# Patient Record
Sex: Female | Born: 1945 | Race: White | Hispanic: No | Marital: Married | Smoking: Current some day smoker
Health system: Southern US, Community
[De-identification: ages and names within clinical notes are randomized; demographics above are authoritative.]

## PROBLEM LIST (undated history)

## (undated) DIAGNOSIS — Z9221 Personal history of antineoplastic chemotherapy: Secondary | ICD-10-CM

## (undated) DIAGNOSIS — F32A Depression, unspecified: Secondary | ICD-10-CM

## (undated) DIAGNOSIS — R000 Tachycardia, unspecified: Secondary | ICD-10-CM

## (undated) DIAGNOSIS — J42 Unspecified chronic bronchitis: Secondary | ICD-10-CM

## (undated) DIAGNOSIS — E039 Hypothyroidism, unspecified: Secondary | ICD-10-CM

## (undated) DIAGNOSIS — K219 Gastro-esophageal reflux disease without esophagitis: Secondary | ICD-10-CM

## (undated) DIAGNOSIS — I5022 Chronic systolic (congestive) heart failure: Secondary | ICD-10-CM

## (undated) DIAGNOSIS — C3410 Malignant neoplasm of upper lobe, unspecified bronchus or lung: Secondary | ICD-10-CM

## (undated) DIAGNOSIS — J449 Chronic obstructive pulmonary disease, unspecified: Secondary | ICD-10-CM

## (undated) DIAGNOSIS — Z923 Personal history of irradiation: Secondary | ICD-10-CM

## (undated) DIAGNOSIS — I10 Essential (primary) hypertension: Secondary | ICD-10-CM

## (undated) DIAGNOSIS — B029 Zoster without complications: Secondary | ICD-10-CM

## (undated) DIAGNOSIS — W06XXXA Fall from bed, initial encounter: Secondary | ICD-10-CM

## (undated) DIAGNOSIS — I639 Cerebral infarction, unspecified: Secondary | ICD-10-CM

## (undated) DIAGNOSIS — C3490 Malignant neoplasm of unspecified part of unspecified bronchus or lung: Principal | ICD-10-CM

## (undated) DIAGNOSIS — G40909 Epilepsy, unspecified, not intractable, without status epilepticus: Secondary | ICD-10-CM

## (undated) DIAGNOSIS — M1990 Unspecified osteoarthritis, unspecified site: Secondary | ICD-10-CM

## (undated) DIAGNOSIS — M545 Low back pain, unspecified: Secondary | ICD-10-CM

## (undated) DIAGNOSIS — R011 Cardiac murmur, unspecified: Secondary | ICD-10-CM

## (undated) DIAGNOSIS — F329 Major depressive disorder, single episode, unspecified: Secondary | ICD-10-CM

## (undated) HISTORY — DX: Malignant neoplasm of unspecified part of unspecified bronchus or lung: C34.90

## (undated) HISTORY — DX: Chronic obstructive pulmonary disease, unspecified: J44.9

## (undated) HISTORY — DX: Low back pain: M54.5

## (undated) HISTORY — PX: TONSILLECTOMY: SUR1361

## (undated) HISTORY — DX: Zoster without complications: B02.9

## (undated) HISTORY — DX: Hypothyroidism, unspecified: E03.9

## (undated) HISTORY — DX: Low back pain, unspecified: M54.50

## (undated) HISTORY — DX: Personal history of antineoplastic chemotherapy: Z92.21

## (undated) HISTORY — DX: Malignant neoplasm of upper lobe, unspecified bronchus or lung: C34.10

## (undated) HISTORY — DX: Gastro-esophageal reflux disease without esophagitis: K21.9

---

## 1982-06-26 HISTORY — PX: TUBAL LIGATION: SHX77

## 1984-06-26 HISTORY — PX: VAGINAL HYSTERECTOMY: SUR661

## 1998-11-02 ENCOUNTER — Other Ambulatory Visit: Admission: RE | Admit: 1998-11-02 | Discharge: 1998-11-02 | Payer: Self-pay | Admitting: Endocrinology

## 1999-12-01 ENCOUNTER — Other Ambulatory Visit: Admission: RE | Admit: 1999-12-01 | Discharge: 1999-12-01 | Payer: Self-pay | Admitting: Endocrinology

## 2000-01-23 ENCOUNTER — Encounter: Admission: RE | Admit: 2000-01-23 | Discharge: 2000-01-23 | Payer: Self-pay | Admitting: Endocrinology

## 2000-01-23 ENCOUNTER — Encounter: Payer: Self-pay | Admitting: Endocrinology

## 2000-12-07 ENCOUNTER — Encounter: Payer: Self-pay | Admitting: Endocrinology

## 2000-12-07 ENCOUNTER — Encounter: Admission: RE | Admit: 2000-12-07 | Discharge: 2000-12-07 | Payer: Self-pay | Admitting: Endocrinology

## 2000-12-24 ENCOUNTER — Other Ambulatory Visit: Admission: RE | Admit: 2000-12-24 | Discharge: 2000-12-24 | Payer: Self-pay | Admitting: Endocrinology

## 2001-12-09 ENCOUNTER — Encounter: Admission: RE | Admit: 2001-12-09 | Discharge: 2001-12-09 | Payer: Self-pay | Admitting: Endocrinology

## 2001-12-09 ENCOUNTER — Encounter: Payer: Self-pay | Admitting: Endocrinology

## 2003-01-16 ENCOUNTER — Other Ambulatory Visit: Admission: RE | Admit: 2003-01-16 | Discharge: 2003-01-16 | Payer: Self-pay | Admitting: Endocrinology

## 2004-04-15 ENCOUNTER — Ambulatory Visit: Payer: Self-pay | Admitting: Internal Medicine

## 2004-04-27 ENCOUNTER — Ambulatory Visit (HOSPITAL_COMMUNITY): Admission: RE | Admit: 2004-04-27 | Discharge: 2004-04-27 | Payer: Self-pay | Admitting: Nurse Practitioner

## 2004-04-27 ENCOUNTER — Ambulatory Visit: Payer: Self-pay | Admitting: Internal Medicine

## 2004-05-05 ENCOUNTER — Ambulatory Visit: Payer: Self-pay | Admitting: *Deleted

## 2004-05-09 ENCOUNTER — Ambulatory Visit: Payer: Self-pay | Admitting: Internal Medicine

## 2004-05-18 ENCOUNTER — Ambulatory Visit: Payer: Self-pay | Admitting: Internal Medicine

## 2004-08-16 ENCOUNTER — Ambulatory Visit: Payer: Self-pay | Admitting: Internal Medicine

## 2004-08-18 ENCOUNTER — Ambulatory Visit: Payer: Self-pay | Admitting: Internal Medicine

## 2004-08-31 ENCOUNTER — Ambulatory Visit: Payer: Self-pay | Admitting: Internal Medicine

## 2004-09-07 ENCOUNTER — Ambulatory Visit: Payer: Self-pay | Admitting: Internal Medicine

## 2004-09-14 ENCOUNTER — Ambulatory Visit: Payer: Self-pay | Admitting: Internal Medicine

## 2004-09-19 ENCOUNTER — Ambulatory Visit: Payer: Self-pay | Admitting: Internal Medicine

## 2004-09-21 ENCOUNTER — Ambulatory Visit: Payer: Self-pay | Admitting: Internal Medicine

## 2004-10-05 ENCOUNTER — Ambulatory Visit: Payer: Self-pay | Admitting: Internal Medicine

## 2004-10-10 ENCOUNTER — Ambulatory Visit: Payer: Self-pay | Admitting: Internal Medicine

## 2004-10-17 ENCOUNTER — Ambulatory Visit: Payer: Self-pay | Admitting: Internal Medicine

## 2004-10-19 ENCOUNTER — Ambulatory Visit: Payer: Self-pay | Admitting: Internal Medicine

## 2004-11-02 ENCOUNTER — Ambulatory Visit: Payer: Self-pay | Admitting: Internal Medicine

## 2004-11-29 ENCOUNTER — Ambulatory Visit: Payer: Self-pay | Admitting: Internal Medicine

## 2004-12-07 ENCOUNTER — Ambulatory Visit (HOSPITAL_COMMUNITY): Admission: RE | Admit: 2004-12-07 | Discharge: 2004-12-07 | Payer: Self-pay | Admitting: Family Medicine

## 2004-12-23 ENCOUNTER — Ambulatory Visit: Payer: Self-pay | Admitting: Internal Medicine

## 2005-01-06 ENCOUNTER — Ambulatory Visit: Payer: Self-pay | Admitting: Internal Medicine

## 2005-01-10 ENCOUNTER — Ambulatory Visit: Payer: Self-pay | Admitting: Internal Medicine

## 2005-01-19 ENCOUNTER — Ambulatory Visit: Payer: Self-pay | Admitting: Internal Medicine

## 2005-02-03 ENCOUNTER — Ambulatory Visit: Payer: Self-pay | Admitting: Internal Medicine

## 2005-03-15 ENCOUNTER — Ambulatory Visit: Payer: Self-pay | Admitting: Internal Medicine

## 2005-05-11 ENCOUNTER — Ambulatory Visit: Payer: Self-pay | Admitting: Family Medicine

## 2005-05-15 ENCOUNTER — Ambulatory Visit: Payer: Self-pay | Admitting: Internal Medicine

## 2005-06-27 ENCOUNTER — Ambulatory Visit: Payer: Self-pay | Admitting: Internal Medicine

## 2005-06-30 ENCOUNTER — Ambulatory Visit: Payer: Self-pay | Admitting: Internal Medicine

## 2005-07-06 ENCOUNTER — Ambulatory Visit: Payer: Self-pay | Admitting: Internal Medicine

## 2005-08-16 ENCOUNTER — Encounter: Admission: RE | Admit: 2005-08-16 | Discharge: 2005-08-16 | Payer: Self-pay | Admitting: General Surgery

## 2005-09-18 ENCOUNTER — Ambulatory Visit: Payer: Self-pay | Admitting: Internal Medicine

## 2005-10-27 ENCOUNTER — Ambulatory Visit: Payer: Self-pay | Admitting: Internal Medicine

## 2006-02-27 ENCOUNTER — Ambulatory Visit: Payer: Self-pay | Admitting: Family Medicine

## 2006-03-20 ENCOUNTER — Ambulatory Visit (HOSPITAL_COMMUNITY): Admission: RE | Admit: 2006-03-20 | Discharge: 2006-03-20 | Payer: Self-pay | Admitting: Family Medicine

## 2006-03-27 ENCOUNTER — Ambulatory Visit: Payer: Self-pay | Admitting: Internal Medicine

## 2006-04-20 ENCOUNTER — Ambulatory Visit: Payer: Self-pay | Admitting: Family Medicine

## 2006-05-11 ENCOUNTER — Ambulatory Visit: Payer: Self-pay | Admitting: Internal Medicine

## 2006-07-09 ENCOUNTER — Ambulatory Visit: Payer: Self-pay | Admitting: Family Medicine

## 2006-07-11 ENCOUNTER — Ambulatory Visit: Payer: Self-pay | Admitting: Family Medicine

## 2006-08-06 ENCOUNTER — Ambulatory Visit: Payer: Self-pay | Admitting: Internal Medicine

## 2006-09-19 ENCOUNTER — Ambulatory Visit (HOSPITAL_COMMUNITY): Admission: RE | Admit: 2006-09-19 | Discharge: 2006-09-19 | Payer: Self-pay | Admitting: Family Medicine

## 2006-11-02 ENCOUNTER — Ambulatory Visit: Payer: Self-pay | Admitting: Internal Medicine

## 2007-03-13 ENCOUNTER — Encounter (INDEPENDENT_AMBULATORY_CARE_PROVIDER_SITE_OTHER): Payer: Self-pay | Admitting: *Deleted

## 2007-03-15 DIAGNOSIS — M19049 Primary osteoarthritis, unspecified hand: Secondary | ICD-10-CM | POA: Insufficient documentation

## 2007-03-15 DIAGNOSIS — F329 Major depressive disorder, single episode, unspecified: Secondary | ICD-10-CM | POA: Insufficient documentation

## 2007-03-15 DIAGNOSIS — J439 Emphysema, unspecified: Secondary | ICD-10-CM

## 2007-03-15 DIAGNOSIS — J209 Acute bronchitis, unspecified: Secondary | ICD-10-CM | POA: Insufficient documentation

## 2007-03-15 DIAGNOSIS — I10 Essential (primary) hypertension: Secondary | ICD-10-CM | POA: Insufficient documentation

## 2007-03-15 DIAGNOSIS — E039 Hypothyroidism, unspecified: Secondary | ICD-10-CM | POA: Insufficient documentation

## 2007-03-15 DIAGNOSIS — F3289 Other specified depressive episodes: Secondary | ICD-10-CM | POA: Insufficient documentation

## 2007-05-01 ENCOUNTER — Ambulatory Visit: Payer: Self-pay | Admitting: Internal Medicine

## 2007-05-02 ENCOUNTER — Encounter (INDEPENDENT_AMBULATORY_CARE_PROVIDER_SITE_OTHER): Payer: Self-pay | Admitting: Internal Medicine

## 2007-05-09 ENCOUNTER — Ambulatory Visit: Payer: Self-pay | Admitting: Internal Medicine

## 2007-05-15 ENCOUNTER — Encounter (INDEPENDENT_AMBULATORY_CARE_PROVIDER_SITE_OTHER): Payer: Self-pay | Admitting: Internal Medicine

## 2007-05-15 LAB — CONVERTED CEMR LAB
HDL: 43 mg/dL (ref >39)
Triglycerides: 184 mg/dL — ABNORMAL HIGH (ref <150)

## 2007-07-01 ENCOUNTER — Telehealth (INDEPENDENT_AMBULATORY_CARE_PROVIDER_SITE_OTHER): Payer: Self-pay | Admitting: Nurse Practitioner

## 2007-07-11 ENCOUNTER — Ambulatory Visit: Payer: Self-pay | Admitting: Internal Medicine

## 2007-07-11 DIAGNOSIS — F17200 Nicotine dependence, unspecified, uncomplicated: Secondary | ICD-10-CM

## 2007-07-18 ENCOUNTER — Ambulatory Visit: Payer: Self-pay | Admitting: Internal Medicine

## 2007-08-05 ENCOUNTER — Ambulatory Visit: Payer: Self-pay | Admitting: Internal Medicine

## 2007-12-13 ENCOUNTER — Ambulatory Visit: Payer: Self-pay | Admitting: Internal Medicine

## 2007-12-17 ENCOUNTER — Ambulatory Visit (HOSPITAL_COMMUNITY): Admission: RE | Admit: 2007-12-17 | Discharge: 2007-12-17 | Payer: Self-pay | Admitting: Internal Medicine

## 2007-12-17 ENCOUNTER — Telehealth (INDEPENDENT_AMBULATORY_CARE_PROVIDER_SITE_OTHER): Payer: Self-pay | Admitting: Internal Medicine

## 2007-12-17 ENCOUNTER — Encounter (INDEPENDENT_AMBULATORY_CARE_PROVIDER_SITE_OTHER): Payer: Self-pay | Admitting: Internal Medicine

## 2007-12-19 ENCOUNTER — Telehealth (INDEPENDENT_AMBULATORY_CARE_PROVIDER_SITE_OTHER): Payer: Self-pay | Admitting: Internal Medicine

## 2008-03-25 ENCOUNTER — Ambulatory Visit: Payer: Self-pay | Admitting: Internal Medicine

## 2008-03-25 LAB — CONVERTED CEMR LAB
ALT: 14 units/L (ref 0–35)
HDL: 47 mg/dL (ref >39)
LDL Cholesterol: 131 mg/dL — ABNORMAL HIGH (ref 0–99)
Total Bilirubin: 0.5 mg/dL (ref 0.3–1.2)
Total CHOL/HDL Ratio: 4.4
Triglycerides: 139 mg/dL (ref <150)
VLDL: 28 mg/dL (ref 0–40)

## 2008-03-27 ENCOUNTER — Telehealth (INDEPENDENT_AMBULATORY_CARE_PROVIDER_SITE_OTHER): Payer: Self-pay | Admitting: Internal Medicine

## 2008-03-27 ENCOUNTER — Ambulatory Visit: Payer: Self-pay | Admitting: Internal Medicine

## 2008-03-31 ENCOUNTER — Ambulatory Visit: Payer: Self-pay | Admitting: Internal Medicine

## 2008-04-13 ENCOUNTER — Telehealth (INDEPENDENT_AMBULATORY_CARE_PROVIDER_SITE_OTHER): Payer: Self-pay | Admitting: Nurse Practitioner

## 2008-05-09 ENCOUNTER — Telehealth (INDEPENDENT_AMBULATORY_CARE_PROVIDER_SITE_OTHER): Payer: Self-pay | Admitting: Internal Medicine

## 2008-05-31 ENCOUNTER — Inpatient Hospital Stay (HOSPITAL_COMMUNITY): Admission: EM | Admit: 2008-05-31 | Discharge: 2008-06-01 | Payer: Self-pay | Admitting: Emergency Medicine

## 2008-05-31 ENCOUNTER — Ambulatory Visit: Payer: Self-pay | Admitting: Family Medicine

## 2008-07-07 ENCOUNTER — Ambulatory Visit: Payer: Self-pay | Admitting: Internal Medicine

## 2008-07-07 DIAGNOSIS — E876 Hypokalemia: Secondary | ICD-10-CM | POA: Insufficient documentation

## 2008-07-07 DIAGNOSIS — J189 Pneumonia, unspecified organism: Secondary | ICD-10-CM

## 2008-07-07 LAB — CONVERTED CEMR LAB
BUN: 12 mg/dL (ref 6–23)
CO2: 24 meq/L (ref 19–32)
Calcium: 9 mg/dL (ref 8.4–10.5)
Creatinine, Ser: 0.83 mg/dL (ref 0.40–1.20)
Glucose, Bld: 85 mg/dL (ref 70–99)
Potassium: 4.3 meq/L (ref 3.5–5.3)

## 2008-08-11 ENCOUNTER — Telehealth (INDEPENDENT_AMBULATORY_CARE_PROVIDER_SITE_OTHER): Payer: Self-pay | Admitting: Internal Medicine

## 2008-09-04 ENCOUNTER — Ambulatory Visit: Payer: Self-pay | Admitting: Internal Medicine

## 2008-09-04 LAB — CONVERTED CEMR LAB
ALT: 9 units/L (ref 0–35)
AST: 15 units/L (ref 0–37)
Albumin: 4.4 g/dL (ref 3.5–5.2)
Alkaline Phosphatase: 66 units/L (ref 39–117)
CO2: 28 meq/L (ref 19–32)
Calcium: 9.7 mg/dL (ref 8.4–10.5)
Cholesterol: 224 mg/dL — ABNORMAL HIGH (ref 0–200)
Creatinine, Ser: 0.88 mg/dL (ref 0.40–1.20)
Glucose, Bld: 100 mg/dL — ABNORMAL HIGH (ref 70–99)
HDL: 54 mg/dL (ref >39)
LDL Cholesterol: 153 mg/dL — ABNORMAL HIGH (ref 0–99)
Total Protein: 7.2 g/dL (ref 6.0–8.3)
Triglycerides: 84 mg/dL (ref <150)

## 2008-09-13 ENCOUNTER — Encounter (INDEPENDENT_AMBULATORY_CARE_PROVIDER_SITE_OTHER): Payer: Self-pay | Admitting: Internal Medicine

## 2008-10-06 ENCOUNTER — Ambulatory Visit: Payer: Self-pay | Admitting: Internal Medicine

## 2008-10-18 ENCOUNTER — Encounter (INDEPENDENT_AMBULATORY_CARE_PROVIDER_SITE_OTHER): Payer: Self-pay | Admitting: Internal Medicine

## 2008-11-17 ENCOUNTER — Telehealth (INDEPENDENT_AMBULATORY_CARE_PROVIDER_SITE_OTHER): Payer: Self-pay | Admitting: Internal Medicine

## 2008-12-10 ENCOUNTER — Ambulatory Visit: Payer: Self-pay | Admitting: Internal Medicine

## 2008-12-10 DIAGNOSIS — J309 Allergic rhinitis, unspecified: Secondary | ICD-10-CM | POA: Insufficient documentation

## 2008-12-10 DIAGNOSIS — E785 Hyperlipidemia, unspecified: Secondary | ICD-10-CM | POA: Insufficient documentation

## 2009-03-04 ENCOUNTER — Telehealth (INDEPENDENT_AMBULATORY_CARE_PROVIDER_SITE_OTHER): Payer: Self-pay | Admitting: Internal Medicine

## 2009-03-11 ENCOUNTER — Ambulatory Visit: Payer: Self-pay | Admitting: Internal Medicine

## 2009-03-11 DIAGNOSIS — D126 Benign neoplasm of colon, unspecified: Secondary | ICD-10-CM | POA: Insufficient documentation

## 2009-03-11 LAB — CONVERTED CEMR LAB
Cholesterol: 225 mg/dL — ABNORMAL HIGH (ref 0–200)
HDL: 51 mg/dL (ref >39)
LDL Cholesterol: 150 mg/dL — ABNORMAL HIGH (ref 0–99)

## 2009-03-12 ENCOUNTER — Encounter: Admission: RE | Admit: 2009-03-12 | Discharge: 2009-03-12 | Payer: Self-pay | Admitting: Internal Medicine

## 2009-03-16 ENCOUNTER — Ambulatory Visit (HOSPITAL_COMMUNITY): Admission: RE | Admit: 2009-03-16 | Discharge: 2009-03-16 | Payer: Self-pay | Admitting: Internal Medicine

## 2009-03-16 ENCOUNTER — Encounter (INDEPENDENT_AMBULATORY_CARE_PROVIDER_SITE_OTHER): Payer: Self-pay | Admitting: Internal Medicine

## 2009-03-16 DIAGNOSIS — M899 Disorder of bone, unspecified: Secondary | ICD-10-CM | POA: Insufficient documentation

## 2009-03-16 DIAGNOSIS — M949 Disorder of cartilage, unspecified: Secondary | ICD-10-CM

## 2009-03-22 ENCOUNTER — Encounter (INDEPENDENT_AMBULATORY_CARE_PROVIDER_SITE_OTHER): Payer: Self-pay | Admitting: Internal Medicine

## 2009-03-31 ENCOUNTER — Ambulatory Visit: Payer: Self-pay | Admitting: Internal Medicine

## 2009-04-07 ENCOUNTER — Ambulatory Visit: Payer: Self-pay | Admitting: Gastroenterology

## 2009-04-07 ENCOUNTER — Telehealth: Payer: Self-pay | Admitting: Physician Assistant

## 2009-04-09 ENCOUNTER — Ambulatory Visit: Payer: Self-pay | Admitting: Internal Medicine

## 2009-04-15 ENCOUNTER — Encounter: Payer: Self-pay | Admitting: Gastroenterology

## 2009-04-15 ENCOUNTER — Ambulatory Visit: Payer: Self-pay | Admitting: Gastroenterology

## 2009-04-19 ENCOUNTER — Encounter: Payer: Self-pay | Admitting: Gastroenterology

## 2009-04-28 ENCOUNTER — Encounter (INDEPENDENT_AMBULATORY_CARE_PROVIDER_SITE_OTHER): Payer: Self-pay | Admitting: Internal Medicine

## 2009-06-22 ENCOUNTER — Telehealth (INDEPENDENT_AMBULATORY_CARE_PROVIDER_SITE_OTHER): Payer: Self-pay | Admitting: Internal Medicine

## 2009-06-23 ENCOUNTER — Ambulatory Visit: Payer: Self-pay | Admitting: Internal Medicine

## 2009-06-23 DIAGNOSIS — J019 Acute sinusitis, unspecified: Secondary | ICD-10-CM

## 2009-06-23 DIAGNOSIS — J441 Chronic obstructive pulmonary disease with (acute) exacerbation: Secondary | ICD-10-CM

## 2009-06-23 LAB — CONVERTED CEMR LAB: Rapid Strep: NEGATIVE

## 2009-09-07 ENCOUNTER — Ambulatory Visit: Payer: Self-pay | Admitting: Internal Medicine

## 2009-09-07 ENCOUNTER — Ambulatory Visit (HOSPITAL_COMMUNITY): Admission: RE | Admit: 2009-09-07 | Discharge: 2009-09-07 | Payer: Self-pay | Admitting: Internal Medicine

## 2009-09-07 LAB — CONVERTED CEMR LAB
Alkaline Phosphatase: 67 units/L (ref 39–117)
Basophils Absolute: 0 10*3/uL (ref 0.0–0.1)
Basophils Relative: 0 % (ref 0–1)
Calcium: 9.6 mg/dL (ref 8.4–10.5)
Chlamydia, Swab/Urine, PCR: NEGATIVE
Cholesterol: 232 mg/dL — ABNORMAL HIGH (ref 0–200)
Creatinine, Ser: 0.8 mg/dL (ref 0.40–1.20)
Eosinophils Absolute: 0.1 10*3/uL (ref 0.0–0.7)
GC Probe Amp, Urine: NEGATIVE
Glucose, Bld: 95 mg/dL (ref 70–99)
HCT: 45.3 % (ref 36.0–46.0)
HDL: 54 mg/dL (ref >39)
Hemoglobin: 14.8 g/dL (ref 12.0–15.0)
LDL Cholesterol: 155 mg/dL — ABNORMAL HIGH (ref 0–99)
Lymphocytes Relative: 17 % (ref 12–46)
MCV: 94.6 fL (ref 78.0–100.0)
Monocytes Absolute: 0.7 10*3/uL (ref 0.1–1.0)
Monocytes Relative: 9 % (ref 3–12)
Neutro Abs: 5.8 10*3/uL (ref 1.7–7.7)
Platelets: 375 10*3/uL (ref 150–400)
Potassium: 4.1 meq/L (ref 3.5–5.3)
RDW: 13.3 % (ref 11.5–15.5)
TSH: 0.244 microintl units/mL — ABNORMAL LOW (ref 0.350–4.500)
Urobilinogen, UA: NEGATIVE
VLDL: 23 mg/dL (ref 0–40)

## 2009-10-04 ENCOUNTER — Encounter (INDEPENDENT_AMBULATORY_CARE_PROVIDER_SITE_OTHER): Payer: Self-pay | Admitting: Internal Medicine

## 2009-12-22 ENCOUNTER — Ambulatory Visit: Payer: Self-pay | Admitting: Nurse Practitioner

## 2009-12-24 ENCOUNTER — Telehealth (INDEPENDENT_AMBULATORY_CARE_PROVIDER_SITE_OTHER): Payer: Self-pay | Admitting: Internal Medicine

## 2010-01-05 ENCOUNTER — Ambulatory Visit: Payer: Self-pay | Admitting: Internal Medicine

## 2010-01-05 LAB — CONVERTED CEMR LAB: TSH: 4.143 microintl units/mL (ref 0.350–4.500)

## 2010-01-20 ENCOUNTER — Encounter (INDEPENDENT_AMBULATORY_CARE_PROVIDER_SITE_OTHER): Payer: Self-pay | Admitting: Internal Medicine

## 2010-03-01 ENCOUNTER — Telehealth (INDEPENDENT_AMBULATORY_CARE_PROVIDER_SITE_OTHER): Payer: Self-pay | Admitting: Internal Medicine

## 2010-03-19 ENCOUNTER — Encounter (INDEPENDENT_AMBULATORY_CARE_PROVIDER_SITE_OTHER): Payer: Self-pay | Admitting: Internal Medicine

## 2010-03-21 ENCOUNTER — Encounter (INDEPENDENT_AMBULATORY_CARE_PROVIDER_SITE_OTHER): Payer: Self-pay | Admitting: Internal Medicine

## 2010-06-13 ENCOUNTER — Telehealth (INDEPENDENT_AMBULATORY_CARE_PROVIDER_SITE_OTHER): Payer: Self-pay | Admitting: Internal Medicine

## 2010-07-18 ENCOUNTER — Telehealth (INDEPENDENT_AMBULATORY_CARE_PROVIDER_SITE_OTHER): Payer: Self-pay | Admitting: Internal Medicine

## 2010-07-28 NOTE — Progress Notes (Signed)
Summary: Denied PA for Singulair  Phone Note Other Incoming   Summary of Call: From [facility] --will not cover Singulairafer this month--need prior auth papers from them--papers given to [facility] Initial call taken by: Julieanne Manson MD,  March 01, 2010 3:18 PM  Follow-up for Phone Call        Apparently, she has no prior attempts with other meds.  The list is on your chair.  Do you want to continue for PA?  Dutch Quint RN  March 03, 2010 1:53 PM   Additional Follow-up for Phone Call Additional follow up Details #1::        Yes--she already uses an inhaled corticosteroid and long acting beta agonist as well. She may get coverage as it helps with her lower tract as well. Additional Follow-up by: Julieanne Manson MD,  March 04, 2010 5:53 PM    Additional Follow-up for Phone Call Additional follow up Details #2::    PA in progress.  Dutch Quint RN  March 16, 2010 9:16 AM  PA Denied -- folder on your chair.  Dutch Quint RN  March 16, 2010 10:12 AM  Called and spoke with Mardelle Matte to protest denial--refaxed paperwork with additional information.  They will reevaluate.  Julieanne Manson MD  March 17, 2010 6:01 PM

## 2010-07-28 NOTE — Letter (Signed)
Summary: Lipid Letter  [facility]  [address] [place], [place] [address]   Phone: [phone]  Fax: [phone]    10/04/2009  [facility] [address] [place], [place]  [address]  Dear Janice Daniels:  We have carefully reviewed your last lipid profile from 09/07/2009 and the results are noted below with a summary of recommendations for lipid management.    Cholesterol:       232     Goal: <200   HDL "good" Cholesterol:   54     Goal: >45   LDL "bad" Cholesterol:   155     Goal: <130   Triglycerides:       115     Goal: <150    Your cholesterol is fair--would like to see the total under 200 and the LDL at least under 130, if not 100.  Work on diet and exercise and recheck in 1 year.  Rest of labs fine.    TLC Diet (Therapeutic Lifestyle Change): Saturated Fats & Transfatty acids should be kept < 7% of total calories ***Reduce Saturated Fats Polyunstaurated Fat can be up to 10% of total calories Monounsaturated Fat Fat can be up to 20% of total calories Total Fat should be no greater than 25-35% of total calories Carbohydrates should be 50-60% of total calories Protein should be approximately 15% of total calories Fiber should be at least 20-30 grams a day ***Increased fiber may help lower LDL Total Cholesterol should be < 200mg /day Consider adding plant stanol/sterols to diet (example: Benacol spread) ***A higher intake of unsaturated fat may reduce Triglycerides and Increase HDL    Adjunctive Measures (may lower LIPIDS and reduce risk of Heart Attack) include: Aerobic Exercise (20-30 minutes 3-4 times a week) Limit Alcohol Consumption Weight Reduction Aspirin 75-81 mg a day by mouth (if not allergic or contraindicated) Dietary Fiber 20-30 grams a day by mouth     Current Medications: 1)    Celexa 10 Mg  Tabs (Citalopram hydrobromide) .... Take 1 tablet by mouth once a day 2)    Benazepril-hydrochlorothiazide 10-12.5 Mg  Tabs  (Benazepril-hydrochlorothiazide) .... Take 1 tablet by mouth once a day 3)    Advair Diskus 250-50 Mcg/dose  Misc (Fluticasone-salmeterol) .Marland Kitchen.. 1 inhalation two times a day 4)    Ventolin Hfa 108 (90 Base) Mcg/act  Aers (Albuterol sulfate) .... 2 puffs every 6 hours as needed for shortness of breath 5)    Albuterol Sulfate (2.5 Mg/42ml) 0.083% Nebu (Albuterol sulfate) .... Nebulize 1 vial every 4 hours as needed for shortness of breath 6)    Levothyroxine Sodium 112 Mcg Tabs (Levothyroxine sodium) .Marland Kitchen.. 1 tab by mouth daily 7)    Singulair 10 Mg Tabs (Montelukast sodium) .Marland Kitchen.. 1 tab by mouth daily 8)    Aspir-low 81 Mg Tbec (Aspirin) .Marland Kitchen.. 1 tab by mouth daily with food  If you have any questions, please call. We appreciate being able to work with you.   Sincerely,    [facility] Julieanne Manson MD

## 2010-07-28 NOTE — Progress Notes (Signed)
Summary: refills  Phone Note Call from Patient Call back at Home Phone [phone]   Reason for Call: Refill Medication Summary of Call: Janice Daniels PT. MS Burkemper CALLED IN FOR A REILL ON HER VENTOLIN AND HER SYNTHROID.  SHE USES [facility]. Initial call taken by: Leodis Rains,  December 24, 2009 10:52 AM  Follow-up for Phone Call        fwd to Dr. Delrae Alfred for refill [name]. Follow-up by: Vesta Mixer CMA,  December 24, 2009 12:42 PM  Additional Follow-up for Phone Call Additional follow up Details #1::        Pt notified of need for tsh and scheduled for this coming Wed. Additional Follow-up by: Vesta Mixer CMA,  December 31, 2009 1:51 PM    Prescriptions: LEVOTHYROXINE SODIUM 112 MCG TABS (LEVOTHYROXINE SODIUM) 1 tab by mouth daily  #30 x 4   Entered and Authorized by:   Julieanne Manson MD   Signed by:   Julieanne Manson MD on 12/24/2009   Method used:   Faxed to ...       [facility] (retail)       [address] [place], [place]  [address]       Ph: [phone]       Fax: [phone]   RxID:   [id number] VENTOLIN HFA 108 (90 BASE) MCG/ACT  AERS (ALBUTEROL SULFATE) 2 puffs every 6 hours as needed for shortness of breath  #1 x 1   Entered and Authorized by:   Julieanne Manson MD   Signed by:   Julieanne Manson MD on 12/24/2009   Method used:   Faxed to ...       [facility] (retail)       [address] [place], [place]  [address]       Ph: [phone]       Fax: [phone]   RxID:   [id number]  Needs repeat TSH as last one a bit suppressed (too much hormone)

## 2010-07-28 NOTE — Letter (Signed)
Summary: [employer]//REQUEST APPROVED  [employer]//REQUEST APPROVED   Imported By: Arta Bruce 03/25/2010 15:42:42  _____________________________________________________________________  External Attachment:    Type:   Image     Comment:   External Document

## 2010-07-28 NOTE — Letter (Signed)
Summary: TEST ORDER FORM//[employer]//APPT DATE & TIME  TEST ORDER FORM//[employer]//APPT DATE & TIME   Imported By: Arta Bruce 11/04/2009 14:42:25  _____________________________________________________________________  External Attachment:    Type:   Image     Comment:   External Document

## 2010-07-28 NOTE — Assessment & Plan Note (Signed)
Summary: Acute - Bronchitis   Vital Signs:  Patient profile:   65 year old female Menstrual status:  hysterectomy Weight:      125.1 pounds BMI:     20.89 O2 Sat:      94 % on Room air Temp:     98.6 degrees F oral Pulse rate:   87 / minute Pulse rhythm:   regular Resp:     20 per minute BP sitting:   130 / 76  (left arm) Cuff size:   regular  Vitals Entered By: Levon Hedger (December 22, 2009 2:54 PM)  O2 Flow:  Room air CC: cough phlem yellow in color with headache, dizziness, sorethroat and nausea and now it is in her chest x 5 days, Cough, Hypertension Management Is Patient Diabetic? No Pain Assessment Patient in pain? no       Does patient need assistance? Functional Status Self care Ambulation Normal   CC:  cough phlem yellow in color with headache, dizziness, sorethroat and nausea and now it is in her chest x 5 days, Cough, and Hypertension Management.  History of Present Illness:  Cough      This is a 65 year old woman who presents with Cough.  The symptoms began 1 week ago.  The intensity is described as moderate.  The patient reports productive cough, shortness of breath, and wheezing, but denies fever.  Associated symtpoms include cold/URI symptoms and sore throat.  The cough is worse with smoking.  Ineffective prior treatments have included albuterol inhaler.   Notes that pt has been around her sister who was sick and also she visits a cousin at Assisted living who also has similar illness. tobacco use continues.  Pt did not bring medications into the office but is able to recall.  Hypertension History:      She denies headache, chest pain, and palpitations.  pt has not taken BP meds yet today - admits that she does not take daily.        Positive major cardiovascular risk factors include female age 31 years old or older, hyperlipidemia, hypertension, and current tobacco user.     Allergies (verified): No Known Drug Allergies  Review of Systems CV:   Denies chest pain or discomfort. Resp:  Complains of cough, shortness of breath, and wheezing. GI:  Denies abdominal pain, nausea, and vomiting. Neuro:  Complains of headaches.  Physical Exam  General:  alert.   Head:  normocephalic.   Lungs:  decreased airmovement with wheezes and rhonchi throughout Increased AP diameter Heart:  normal rate and regular rhythm.   Abdomen:  normal bowel sounds.   Msk:  up to the exam table Neurologic:  alert & oriented X3.   Skin:  color normal.   Psych:  Oriented X3.     Impression & Recommendations:  Problem # 1:  BRONCHITIS, ACUTE WITH BRONCHOSPASM (ICD-466.0)  02 sat = 94% neb given in office with good response handout given will give amoxil given the length of illness and advised pt that z-pak may be beneficial but pt would like to get medications today and she will have to use [facility] due to lateness of day  Her updated medication list for this problem includes:    Advair Diskus 250-50 Mcg/dose Misc (Fluticasone-salmeterol) .Marland Kitchen... 1 inhalation two times a day    Ventolin Hfa 108 (90 Base) Mcg/act Aers (Albuterol sulfate) .Marland Kitchen... 2 puffs every 6 hours as needed for shortness of breath    Albuterol Sulfate (2.5 Mg/50ml)  0.083% Nebu (Albuterol sulfate) ..... Nebulize 1 vial every 4 hours as needed for shortness of breath    Singulair 10 Mg Tabs (Montelukast sodium) .Marland Kitchen... 1 tab by mouth daily    Amoxicillin 500 Mg Caps (Amoxicillin) ..... One capsule by mouth three times a day for infection  Orders: Albuterol Sulfate Sol 1mg  unit dose (N5621) Atrovent 1mg  (Neb) (H0865) Nebulizer Tx (78469)  Problem # 2:  COPD (ICD-496) advised pt to quit tobacco use use advair twice daily instead of daily as currently doing MDI as needed  Her updated medication list for this problem includes:    Advair Diskus 250-50 Mcg/dose Misc (Fluticasone-salmeterol) .Marland Kitchen... 1 inhalation two times a day    Ventolin Hfa 108 (90 Base) Mcg/act Aers (Albuterol sulfate) .Marland Kitchen...  2 puffs every 6 hours as needed for shortness of breath    Albuterol Sulfate (2.5 Mg/82ml) 0.083% Nebu (Albuterol sulfate) ..... Nebulize 1 vial every 4 hours as needed for shortness of breath    Singulair 10 Mg Tabs (Montelukast sodium) .Marland Kitchen... 1 tab by mouth daily  Orders: Pulse Oximetry (single measurment) (94760)  Complete Medication List: 1)  Celexa 10 Mg Tabs (Citalopram hydrobromide) .... Take 1 tablet by mouth once a day 2)  Benazepril-hydrochlorothiazide 10-12.5 Mg Tabs (Benazepril-hydrochlorothiazide) .... Take 1 tablet by mouth once a day 3)  Advair Diskus 250-50 Mcg/dose Misc (Fluticasone-salmeterol) .Marland Kitchen.. 1 inhalation two times a day 4)  Ventolin Hfa 108 (90 Base) Mcg/act Aers (Albuterol sulfate) .... 2 puffs every 6 hours as needed for shortness of breath 5)  Albuterol Sulfate (2.5 Mg/101ml) 0.083% Nebu (Albuterol sulfate) .... Nebulize 1 vial every 4 hours as needed for shortness of breath 6)  Levothyroxine Sodium 112 Mcg Tabs (Levothyroxine sodium) .Marland Kitchen.. 1 tab by mouth daily 7)  Singulair 10 Mg Tabs (Montelukast sodium) .Marland Kitchen.. 1 tab by mouth daily 8)  Aspir-low 81 Mg Tbec (Aspirin) .Marland Kitchen.. 1 tab by mouth daily with food 9)  Amoxicillin 500 Mg Caps (Amoxicillin) .... One capsule by mouth three times a day for infection  Hypertension Assessment/Plan:      The patient's hypertensive risk group is category B: At least one risk factor (excluding diabetes) with no target organ damage.  Her calculated 10 year risk of coronary heart disease is 13 %.  Today's blood pressure is 130/76.  Her blood pressure goal is < 140/90.  Patient Instructions: 1)  Cough - Get mucinex plain  (NOT DM) for the cough and congestion. 2)  Don't take the DM as this may elevate your blood pressure and heart rate 3)  Drink plenty of liquids to thin mucous 4)  Advair - take twice per day (rinse mouth after use) 5)  Use inhaler three times per day until feeling better 6)  Amoxil (not preferred) but on [facility]'s $4  plan 7)  take three times per day with food Prescriptions: AMOXICILLIN 500 MG CAPS (AMOXICILLIN) One capsule by mouth three times a day for infection  #30 x 0   Entered and Authorized by:   Lehman Prom FNP   Signed by:   Lehman Prom FNP on 12/22/2009   Method used:   Electronically to        [facility] [phone]* (retail)       [address]       [place], [place]  [address]       Ph: [phone]       Fax: [phone]   RxID:   [id number]    Medication Administration  Injection # 1:    Medication: Allergy Injection (1)  Medication # 1:    Medication: Albuterol Sulfate Sol 1mg  unit dose    Diagnosis: BRONCHITIS, ACUTE WITH BRONCHOSPASM (ICD-466.0)    Dose: 0.5mg     Route: po    Exp Date: 09/12    Lot #: [id number]    Patient tolerated medication without complications    Given by: Levon Hedger (December 22, 2009 3:32 PM)  Medication # 2:    Medication: Atrovent 1mg  (Neb)    Diagnosis: BRONCHITIS, ACUTE WITH BRONCHOSPASM (ICD-466.0)    Dose: 2.5mg     Route: po    Exp Date: 08/12    Lot #: [id number]    Patient tolerated medication without complications    Given by: Levon Hedger (December 22, 2009 3:32 PM)  Orders Added: 1)  Est. Patient Level III [81191] 2)  Pulse Oximetry (single measurment) [94760] 3)  Albuterol Sulfate Sol 1mg  unit dose [J7613] 4)  Atrovent 1mg  (Neb) [Y7829] 5)  Nebulizer Tx [56213]

## 2010-07-28 NOTE — Progress Notes (Signed)
Summary: Query:  Refill meds?  Phone Note Outgoing Call   Summary of Call: Refill following meds per protocol?  Benazepril/HCTZ, citalopram, Singulair, Advair and Ventolin? Last seen 12/22/09, CPE 09/07/09.  Currently, no f/u appt. scheduled. Initial call taken by: Dutch Quint RN,  July 18, 2010 10:32 AM  Follow-up for Phone Call        Fill through June. She needs to make an appt. to continue thereafter. Follow-up by: Julieanne Manson MD,  July 18, 2010 12:38 PM  Additional Follow-up for Phone Call Additional follow up Details #1::        Refills completed.  Pt. advised of refills, appt. scheduled 08/12/10. Additional Follow-up by: Dutch Quint RN,  July 18, 2010 2:17 PM

## 2010-07-28 NOTE — Progress Notes (Signed)
Summary: Office Visit//DEPRESSION SCREENING  Office Visit//DEPRESSION SCREENING   Imported By: Arta Bruce 11/08/2009 16:09:04  _____________________________________________________________________  External Attachment:    Type:   Image     Comment:   External Document

## 2010-07-28 NOTE — Miscellaneous (Signed)
Summary: PA approved Singulair 10 mg. x2 years  03/19/10-03/19/12  Clinical Lists Changes  PA approved Singulair 10 mg. x2 years  03/19/10-03/19/12   Medications: Changed medication from SINGULAIR 10 MG TABS (MONTELUKAST SODIUM) 1 tab by mouth daily to SINGULAIR 10 MG TABS (MONTELUKAST SODIUM) 1 tab by mouth daily

## 2010-07-28 NOTE — Progress Notes (Signed)
Summary: Query:  Refill ventolin?  Phone Note Outgoing Call   Summary of Call: Refill Ventolin?  Last seen 11/2009.   Initial call taken by: Dutch Quint RN,  June 13, 2010 3:00 PM  Follow-up for Phone Call        Fill--Rx request also came to me today--not sure why--may want to check with the front so we're not getting duplicates Follow-up by: Julieanne Manson MD,  June 14, 2010 5:59 PM  Additional Follow-up for Phone Call Additional follow up Details #1::        Refill completed.  Providers are only being sent refills from [facility] and scheduled/controlled medications -- we often get duplicates from pharmacies.  Dutch Quint RN  June 15, 2010 9:46 AM

## 2010-07-28 NOTE — Letter (Signed)
Summary: Handout Printed  Printed Handout:  - Bronchitis

## 2010-07-28 NOTE — Letter (Signed)
Summary: *HSN Results Follow up  [facility]  [address] [place], [place] [address]   Phone: [phone]  Fax: [phone]      01/20/2010   [facility] [address] [place], [place]  [address]   Dear  Ms. Jaiyla Vallin,                            ____S.Drinkard,FNP   ____D. Gore,FNP       ____B. McPherson,MD   ____V. Rankins,MD    __X__E. Samauri Kellenberger,MD    ____N. Daphine Deutscher, FNP  ____D. Reche Dixon, MD    ____K. Philipp Deputy, MD    ____Other     This letter is to inform you that your recent test(s):  _______Pap Smear    ____X___Lab Test     _______X-ray    ___X____ is within acceptable limits  _______ requires a medication change  _______ requires a follow-up lab visit  _______ requires a follow-up visit with your provider   Comments:  Thyroid testing fine--filled thryroid hormone for 1 year.       _________________________________________________________ If you have any questions, please contact our office                     Sincerely,  Julieanne Manson MD [facility]

## 2010-07-28 NOTE — Assessment & Plan Note (Signed)
Summary: CPP EXAM//GK   Vital Signs:  Patient profile:   65 year old female Menstrual status:  hysterectomy Weight:      119 pounds Temp:     97.6 degrees F Pulse rate:   98 / minute Pulse rhythm:   regular Resp:     20 per minute BP sitting:   135 / 79  (left arm) Cuff size:   regular  Vitals Entered By: Vesta Mixer CMA (September 07, 2009 8:53 AM) CC: CPP, has had a part. hyst. has her ovaries Is Patient Diabetic? No Pain Assessment Patient in pain? yes     Location: back/rt leg Intensity: 2  Does patient need assistance? Ambulation Normal     Menstrual Status hysterectomy   CC:  CPP and has had a part. hyst. has her ovaries.  History of Present Illness: 65 yo female here for CPP.    Habits & Providers  Alcohol-Tobacco-Diet     Alcohol drinks/day: rare     Tobacco Status: current     Cigarette Packs/Day: 1.0--back up     Year Started: teenager  Exercise-Depression-Behavior     Drug Use: never  Current Medications (verified): 1)  Celexa 10 Mg  Tabs (Citalopram Hydrobromide) .... Take 1 Tablet By Mouth Once A Day 2)  Benazepril-Hydrochlorothiazide 10-12.5 Mg  Tabs (Benazepril-Hydrochlorothiazide) .... Take 1 Tablet By Mouth Once A Day 3)  Advair Diskus 250-50 Mcg/dose  Misc (Fluticasone-Salmeterol) .Marland Kitchen.. 1 Inhalation Two Times A Day 4)  Ventolin Hfa 108 (90 Base) Mcg/act  Aers (Albuterol Sulfate) .... 2 Puffs Every 6 Hours As Needed For Shortness of Breath 5)  Albuterol Sulfate (2.5 Mg/25ml) 0.083% Nebu (Albuterol Sulfate) .... Nebulize 1 Vial Every 4 Hours As Needed For Shortness of Breath 6)  Levothyroxine Sodium 112 Mcg Tabs (Levothyroxine Sodium) .Marland Kitchen.. 1 Tab By Mouth Daily 7)  Singulair 10 Mg Tabs (Montelukast Sodium) .Marland Kitchen.. 1 Tab By Mouth Daily  Allergies (verified): No Known Drug Allergies  Past History:  Past Medical History: Reviewed history from 03/15/2007 and no changes required.  DEPRESSION (ICD-311) COPD (ICD-496) HYPERTENSION  (ICD-401.9) HYPOTHYROIDISM (ICD-244.9) OSTEOARTHRITIS, FINGERS (ICD-715.94) OSTEOPENIA (ICD-733.90) Hx of BRONCHITIS, ACUTE WITH BRONCHOSPASM (ICD-466.0) Dyslipidemia    Past Surgical History: 1.  Vaginal hysterectomy more than20 yrs ago 2.  Tonsillectomy and adenoidectomy as a teenager  Family History: Mother, 44:  CAD, atrial fibrillation--recent electroconversion. Father, died 39:  Respiratory arrest--. may have caused obstruction when suctioning trach per pt.  Had esophageal cancer. No CAD. 10 Siblings:  6 sister, 4 brothers:  2 brothers and 1 sisters died:  1 Brother-suicide, another brother with esophageal cancer, sister died of cervical cancer. 5 Children:  A 6 th child died of crib death.  Son died at 87 of pancreatitis--states pancreas "ruptured", but may have also been more related to Morphine overdose.  Other 4 living children healthy  Social History: Married 46 years. Lives with husband. Worked at [employer] and then [employer] before retired.   Tobacco:  started smoking age 35, still smoking Smoking Status:  current Packs/Day:  1.0--back up Drug Use:  never  Review of Systems General:  Energy better when warmer weather.. Eyes:  glasses for distance only. ENT:  Denies decreased hearing. CV:  Denies palpitations; Chest discomfort --points to high left chest area--generally when relaxing.  Sharp--lasting 2-3 minutes.  Never notes with exertion.  No associated dyspnea or other symptoms.  Does not radiate. Occurs maybe once every 2-3 months.. Resp:  Breating currently good.Marland Kitchen GI:  Denies  abdominal pain, bloody stools, constipation, dark tarry stools, and diarrhea. GU:  Denies abnormal vaginal bleeding, dysuria, and urinary frequency. MS:  Denies joint redness and joint swelling; Right low back pain with radiation down right leg--has had for years, but seems to be getting worse.  Years ago fell, but pain did not start right away.   Also with right knee pain----knee stiff  after sitting.  Has to take several steps before feels better.. Derm:  Denies lesion(s) and rash. Neuro:  Denies numbness, tingling, and weakness; Specifically, no neurologic symptoms in right leg.Marland Kitchen Psych:  Feels she is doing well with depression. Scored a 6 relating to fatigue and appetite for PHQ-9, but pt. states the former is better if the weather is warm..  Physical Exam  General:  Well-developed,well-nourished,in no acute distress; alert,appropriate and cooperative throughout examination Head:  Normocephalic and atraumatic without obvious abnormalities. No apparent alopecia or balding. Eyes:  No corneal or conjunctival inflammation noted. EOMI. Perrla. Funduscopic exam benign, without hemorrhages, exudates or papilledema. Vision grossly normal. Ears:  External ear exam shows no significant lesions or deformities.  Otoscopic examination reveals clear canals, tympanic membranes are intact bilaterally without bulging, retraction, inflammation or discharge. Hearing is grossly normal bilaterally. Nose:  External nasal examination shows no deformity or inflammation. Nasal mucosa are pink and moist without lesions or exudates. Mouth:  Oral mucosa and oropharynx without lesions or exudates.  Teeth in good repair. Neck:  No deformities, masses, or tenderness noted. Breasts:  No mass, nodules, thickening, tenderness, bulging, retraction, inflamation, nipple discharge or skin changes noted.   Lungs:  Decreased BS throughout, rare expiratory wheeze. Heart:  Normal rate and regular rhythm. S1 and S2 normal without gallop, murmur, click, rub or other extra sounds. Abdomen:  soft, normal bowel sounds, no distention, no masses, no guarding, no rigidity, no rebound tenderness, no hepatomegaly, and no splenomegaly.  Mild suprapubic tenderness. Genitalia:  no external lesions, no vaginal discharge, and mucosa pink and moist.  Mild to moderate right adnexal tenderness, no mass detected.  No left adnexal mass or  tenderness.   No pap--S/P hysterectomy Msk:  No deformity or scoliosis noted of thoracic or lumbar spine.   Extremities:  No right knee joint line tenderness, no effusion, no erythema.  NT with compression of patella.  Patellar tendon a bit lax. Neurologic:  No cranial nerve deficits noted. Station and gait are normal. Plantar reflexes are down-going bilaterally. DTRs are symmetrical throughout. Sensory, motor and coordinative functions appear intact. Skin:  Intact without suspicious lesions or rashes Cervical Nodes:  No lymphadenopathy noted Axillary Nodes:  No palpable lymphadenopathy Inguinal Nodes:  No significant adenopathy Psych:  Cognition and judgment appear intact. Alert and cooperative with normal attention span and concentration. No apparent delusions, illusions, hallucinations  A bit anxious   Impression & Recommendations:  Problem # 1:  ROUTINE GYNECOLOGICAL EXAMINATION (ICD-V72.31)  Orders: UA Dipstick w/o Micro (manual) (16109) T-GC Probe, urine [id number]) T-Chlamydia  Probe, urine [id number]) T-HIV Antibody  (Reflex) ([id number]) T-Syphilis Test (RPR) (96295-28413)  Problem # 2:  RIGHT ADNEXAL TENDERNESS (ICD-789.09)  Her updated medication list for this problem includes:    Aspir-low 81 Mg Tbec (Aspirin) .Marland Kitchen... 1 tab by mouth daily with food  Orders: Ultrasound (Ultrasound)  Problem # 3:  OSTEOPENIA (ICD-733.90) Vitamin D level last fall was normal--needs calcium and Vitamin D maintenance  Problem # 4:  TOBACCO ABUSE (ICD-305.1) Encouraged discontinuation  Complete Medication List: 1)  Celexa 10 Mg Tabs (Citalopram hydrobromide) .... Take 1  tablet by mouth once a day 2)  Benazepril-hydrochlorothiazide 10-12.5 Mg Tabs (Benazepril-hydrochlorothiazide) .... Take 1 tablet by mouth once a day 3)  Advair Diskus 250-50 Mcg/dose Misc (Fluticasone-salmeterol) .Marland Kitchen.. 1 inhalation two times a day 4)  Ventolin Hfa 108 (90 Base) Mcg/act Aers (Albuterol sulfate) .... 2  puffs every 6 hours as needed for shortness of breath 5)  Albuterol Sulfate (2.5 Mg/70ml) 0.083% Nebu (Albuterol sulfate) .... Nebulize 1 vial every 4 hours as needed for shortness of breath 6)  Levothyroxine Sodium 112 Mcg Tabs (Levothyroxine sodium) .Marland Kitchen.. 1 tab by mouth daily 7)  Singulair 10 Mg Tabs (Montelukast sodium) .Marland Kitchen.. 1 tab by mouth daily 8)  Aspir-low 81 Mg Tbec (Aspirin) .Marland Kitchen.. 1 tab by mouth daily with food  Other Orders: T-Comprehensive Metabolic Panel [id number]) T-CBC w/Diff (29562-13086) T-Lipid Profile (57846-96295) T-TSH [phone])  Patient Instructions: 1)  Calcium 600 mg with 400 International Units of Vitamin D twice daily 2)  Follow up with Dr. Delrae Alfred in 6 months--Tobacco use and health problem follow up  Preventive Care Screening  Bone Density:    Date:  03/16/2009    Results:  abnormal-osteopenia std dev  Prior Values:    Mammogram:  ASSESSMENT: Negative - BI-RADS 1^MM DIGITAL SCREENING (03/12/2009)    Colonoscopy:  DONE (04/15/2009)    Last Tetanus Booster:  Historical (08/24/2005)    Last Flu Shot:  Fluvax 3+ (03/31/2009)    Last Pneumovax:  Historical (05/09/2004)     Colonoscopy:  next due in 2020--hyperplastic polyp only 03/2009. Osteoprevention:  One serving dairy daily.  Minimal walking.  Does not take calcium or vitamin D. Immunizations:  will needs pneumovax in the fall when turns 64 and when comes in for flu vaccine SBE:  No   Laboratory Results   Urine Tests    Routine Urinalysis   Glucose: negative   (Normal Range: Negative) Bilirubin: small   (Normal Range: Negative) Ketone: negative   (Normal Range: Negative) Spec. Gravity: >=1.030   (Normal Range: 1.003-1.035) Blood: trace-lysed   (Normal Range: Negative) pH: 6.0   (Normal Range: 5.0-8.0) Protein: trace   (Normal Range: Negative) Urobilinogen: negative   (Normal Range: 0-1) Nitrite: negative   (Normal Range: Negative) Leukocyte Esterace: negative   (Normal Range:  Negative)    Date/Time Received: September 07, 2009 10:56 AM  Date/Time Reported: September 07, 2009 10:56 AM   Other Tests  Rapid HIV: negative   Laboratory Results   Urine Tests    Routine Urinalysis   Glucose: negative   (Normal Range: Negative) Bilirubin: small   (Normal Range: Negative) Ketone: negative   (Normal Range: Negative) Spec. Gravity: >=1.030   (Normal Range: 1.003-1.035) Blood: trace-lysed   (Normal Range: Negative) pH: 6.0   (Normal Range: 5.0-8.0) Protein: trace   (Normal Range: Negative) Urobilinogen: negative   (Normal Range: 0-1) Nitrite: negative   (Normal Range: Negative) Leukocyte Esterace: negative   (Normal Range: Negative)      Other Tests  Rapid HIV: negative    Appended Document: hemoccult results  Laboratory Results    Stool - Occult Blood Hemmoccult #1: negative Date: 09/17/2009 Hemoccult #2: negative Date: 09/17/2009 Hemoccult #3: negative Date: 09/17/2009

## 2010-08-08 ENCOUNTER — Encounter: Payer: Self-pay | Admitting: Nurse Practitioner

## 2010-08-08 ENCOUNTER — Encounter (INDEPENDENT_AMBULATORY_CARE_PROVIDER_SITE_OTHER): Payer: Self-pay | Admitting: Nurse Practitioner

## 2010-08-12 ENCOUNTER — Encounter: Payer: Self-pay | Admitting: Internal Medicine

## 2010-08-12 ENCOUNTER — Encounter (INDEPENDENT_AMBULATORY_CARE_PROVIDER_SITE_OTHER): Payer: Self-pay | Admitting: Internal Medicine

## 2010-08-12 DIAGNOSIS — K219 Gastro-esophageal reflux disease without esophagitis: Secondary | ICD-10-CM | POA: Insufficient documentation

## 2010-08-12 LAB — CONVERTED CEMR LAB
Calcium: 9 mg/dL (ref 8.4–10.5)
Creatinine, Ser: 0.71 mg/dL (ref 0.40–1.20)
Eosinophils Absolute: 0.1 10*3/uL (ref 0.0–0.7)
HCT: 42.2 % (ref 36.0–46.0)
LDL Cholesterol: 118 mg/dL — ABNORMAL HIGH (ref 0–99)
Lymphs Abs: 1.3 10*3/uL (ref 0.7–4.0)
MCV: 95.3 fL (ref 78.0–100.0)
Monocytes Relative: 10 % (ref 3–12)
Neutro Abs: 5.1 10*3/uL (ref 1.7–7.7)
Neutrophils Relative %: 71 % (ref 43–77)
Platelets: 522 10*3/uL — ABNORMAL HIGH (ref 150–400)
Potassium: 4.1 meq/L (ref 3.5–5.3)
Sodium: 142 meq/L (ref 135–145)
TSH: 0.486 microintl units/mL (ref 0.350–4.500)
Total CHOL/HDL Ratio: 5.6
Triglycerides: 105 mg/dL (ref <150)
VLDL: 21 mg/dL (ref 0–40)

## 2010-08-17 NOTE — Assessment & Plan Note (Signed)
Summary: Refill on meds and stomach pain   Vital Signs:  Patient profile:   65 year old female Menstrual status:  hysterectomy Weight:      115.13 pounds O2 Sat:      91 % on Room air Temp:     96.8 degrees F oral Pulse rate:   96 / minute Pulse rhythm:   regular Resp:     20 per minute BP sitting:   110 / 70  (left arm) Cuff size:   regular  Vitals Entered By: Hale Drone CMA (August 12, 2010 9:19 AM)  O2 Flow:  Room air CC: Refills on meds. Stomach is still very tender x2/3 months and Nykedtra gaver her Nexium. Has helped and decreased her belching. Concerned about weight.  Is Patient Diabetic? No Pain Assessment Patient in pain? yes     Location: stomach  Does patient need assistance? Functional Status Self care Ambulation Normal   Serial Vital Signs/Assessments:                                PEF    PreRx  PostRx Time      O2 Sat  O2 Type     L/min  L/min  L/min   By 9:25 AM   91  %   Room air                          Hale Drone CMA  Comments: 9:25 AM Peak Flow: 140 - 150 - 160 By: Hale Drone CMA    CC:  Refills on meds. Stomach is still very tender x2/3 months and Nykedtra gaver her Nexium. Has helped and decreased her belching. Concerned about weight. .  History of Present Illness: 1.  Acute Bronchitis:  Doing much better since last seen on 13th.  Did get flu vaccine this year.  2.  GERD:  Acid reflux symptoms for 2 months.  Lots of belching, occasionally with acid.  Raw feeling in epigastric area.  Gags when brushes teeth.  No brash taste in morning on awakening.  Was placed on Nexium 4 days ago with samples--has helped.  Still smoking 4 cigarettes daily.  Not eating a lot of chocolate.  3 cups of coffee daily.  No alcohol.  Was taking 81 mg of ASA daily, sometimes a regular aspirin.   No other NSAIDS.  No melena or hematochezia.  HOB not elevated.  Not much in way of onions or tomatoes.  Has limited spicy foods.  3.    Current Medications (verified): 1)   Celexa 10 Mg  Tabs (Citalopram Hydrobromide) .... Take 1 Tablet By Mouth Once A Day 2)  Benazepril-Hydrochlorothiazide 10-12.5 Mg  Tabs (Benazepril-Hydrochlorothiazide) .... Take 1 Tablet By Mouth Once A Day 3)  Advair Diskus 250-50 Mcg/dose  Misc (Fluticasone-Salmeterol) .Marland Kitchen.. 1 Inhalation Two Times A Day 4)  Ventolin Hfa 108 (90 Base) Mcg/act  Aers (Albuterol Sulfate) .... 2 Puffs Every 6 Hours As Needed For Shortness of Breath 5)  Albuterol Sulfate (2.5 Mg/15ml) 0.083% Nebu (Albuterol Sulfate) .... Nebulize 1 Vial Every 4 Hours As Needed For Shortness of Breath 6)  Levothyroxine Sodium 112 Mcg Tabs (Levothyroxine Sodium) .Marland Kitchen.. 1 Tab By Mouth Daily 7)  Singulair 10 Mg Tabs (Montelukast Sodium) .Marland Kitchen.. 1 Tab By Mouth Daily 8)  Aspir-Low 81 Mg Tbec (Aspirin) .Marland Kitchen.. 1 Tab By Mouth Daily With Food 9)  Amoxicillin 500 Mg  Caps (Amoxicillin) .... One Capsule By Mouth Three Times A Day For Infection 10)  Promethazine-Codeine 6.25-10 Mg/55ml Syrp (Promethazine-Codeine) .... Two Teaspoons By Mouth Two Times A Day As Needed For Cough  Allergies (verified): No Known Drug Allergies  Social History: Reviewed history from 09/07/2009 and no changes required. Married 46 years. Lives with husband. Worked at [employer] and then [employer] before retired.   Tobacco:  started smoking age 26, still smoking 4 cigarettes daily  Physical Exam  General:  NAD Lungs:  Few wheezes anteriorly.  No crackles.  Moving air fairly well. Heart:  Normal rate and regular rhythm. S1 and S2 normal without gallop, murmur, click, rub or other extra sounds. Abdomen:  Mild epigastric tenderness.soft, normal bowel sounds, no masses, no guarding, no rigidity, no hepatomegaly, and no splenomegaly.     Impression & Recommendations:  Problem # 1:  ACUTE BRONCHITIS (ICD-466.0) Improving Her updated medication list for this problem includes:    Advair Diskus 250-50 Mcg/dose Misc (Fluticasone-salmeterol) .Marland Kitchen... 1 inhalation two times a  day    Ventolin Hfa 108 (90 Base) Mcg/act Aers (Albuterol sulfate) .Marland Kitchen... 2 puffs every 6 hours as needed for shortness of breath    Albuterol Sulfate (2.5 Mg/28ml) 0.083% Nebu (Albuterol sulfate) ..... Nebulize 1 vial every 4 hours as needed for shortness of breath    Singulair 10 Mg Tabs (Montelukast sodium) .Marland Kitchen... 1 tab by mouth daily    Amoxicillin 500 Mg Caps (Amoxicillin) ..... One capsule by mouth three times a day for infection    Promethazine-codeine 6.25-10 Mg/72ml Syrp (Promethazine-codeine) .Marland Kitchen..Marland Kitchen Two teaspoons by mouth two times a day as needed for cough  Problem # 2:  GERD (ICD-530.81) Switch to H2 blocker to avoid rebound with PPI as hope to just treat for 2-3 months Her updated medication list for this problem includes:    Ranitidine Hcl 150 Mg Tabs (Ranitidine hcl) .Marland Kitchen... 1 tab by mouth two times a day 1/2 hour before meals  Problem # 3:  TOBACCO ABUSE (ICD-305.1) Encourage complete discontinuation  Complete Medication List: 1)  Celexa 10 Mg Tabs (Citalopram hydrobromide) .... Take 1 tablet by mouth once a day 2)  Benazepril-hydrochlorothiazide 10-12.5 Mg Tabs (Benazepril-hydrochlorothiazide) .... Take 1 tablet by mouth once a day 3)  Advair Diskus 250-50 Mcg/dose Misc (Fluticasone-salmeterol) .Marland Kitchen.. 1 inhalation two times a day 4)  Ventolin Hfa 108 (90 Base) Mcg/act Aers (Albuterol sulfate) .... 2 puffs every 6 hours as needed for shortness of breath 5)  Albuterol Sulfate (2.5 Mg/15ml) 0.083% Nebu (Albuterol sulfate) .... Nebulize 1 vial every 4 hours as needed for shortness of breath 6)  Levothyroxine Sodium 112 Mcg Tabs (Levothyroxine sodium) .Marland Kitchen.. 1 tab by mouth daily 7)  Singulair 10 Mg Tabs (Montelukast sodium) .Marland Kitchen.. 1 tab by mouth daily 8)  Aspir-low 81 Mg Tbec (Aspirin) .Marland Kitchen.. 1 tab by mouth daily with food 9)  Amoxicillin 500 Mg Caps (Amoxicillin) .... One capsule by mouth three times a day for infection 10)  Promethazine-codeine 6.25-10 Mg/85ml Syrp (Promethazine-codeine) ....  Two teaspoons by mouth two times a day as needed for cough 11)  Ranitidine Hcl 150 Mg Tabs (Ranitidine hcl) .Marland Kitchen.. 1 tab by mouth two times a day 1/2 hour before meals  Other Orders: Pulse Oximetry (single measurment) (94760) Peak Flow Rate (94150) T-TSH (29562-13086) T-Lipid Profile (57846-96295) T-Comprehensive Metabolic Panel ([id number]) T-CBC w/Diff (02725-36644)  Patient Instructions: 1)  Need to be seen in follow up in next 5 months either with [facility] or new provider.  2)  If with [place], would recommend a CPE with Dr. Delrae Alfred Prescriptions: LEVOTHYROXINE SODIUM 112 MCG TABS (LEVOTHYROXINE SODIUM) 1 tab by mouth daily  #30 x 6   Entered and Authorized by:   Julieanne Manson MD   Signed by:   Julieanne Manson MD on 08/12/2010   Method used:   Electronically to        [facility] [address] [phone]* (retail)       [address]       [place], [place]  [address]       Ph: [phone] or [phone]       Fax: [phone]   RxID:   [id number] RANITIDINE HCL 150 MG TABS (RANITIDINE HCL) 1 tab by mouth two times a day 1/2 hour before meals  #60 x 3   Entered and Authorized by:   Julieanne Manson MD   Signed by:   Julieanne Manson MD on 08/12/2010   Method used:   Electronically to        [facility] [address] [phone]* (retail)       [address]       [place], [place]  [address]       Ph: [phone] or [phone]       Fax: [phone]   RxID:   [id number]    Orders Added: 1)  Pulse Oximetry (single measurment) [94760] 2)  Peak Flow Rate [94150] 3)  Est. Patient Level III [99213] 4)  T-TSH [15176-16073] 5)  T-Lipid Profile [80061-22930] 6)  T-Comprehensive Metabolic Panel [80053-22900] 7)  T-CBC w/Diff [[id number]]

## 2010-08-17 NOTE — Assessment & Plan Note (Signed)
Summary: Acute Bronchitis   Vital Signs:  Patient profile:   65 year old female Menstrual status:  hysterectomy Height:      65 inches Weight:      113 pounds BMI:     18.87 O2 Sat:      92 % Temp:     96.8 degrees F oral Pulse rate:   80 / minute Pulse rhythm:   regular Resp:     18 per minute BP sitting:   140 / 60  (left arm) Cuff size:   regular CC: pt is here for fever and cold syptoms.... Is Patient Diabetic? No Pain Assessment Patient in pain? no       Does patient need assistance? Functional Status Self care Ambulation Normal   CC:  pt is here for fever and cold syptoms.....  History of Present Illness:  Pt into the office for f/u on symptoms that started 3 days ago +fever at night +chills during the day +appetite is gone - unable to eat, pt has been drinking gatorade and water. +cough  Current tobacco use until 3 days ago when illness started  No sick contacts at home  Habits & Providers  Alcohol-Tobacco-Diet     Alcohol drinks/day: rare     Tobacco Status: current     Cigarette Packs/Day: 1.0--back up     Year Started: teenager     Year Quit: 2007  Exercise-Depression-Behavior     Drug Use: never  Current Medications (verified): 1)  Celexa 10 Mg  Tabs (Citalopram Hydrobromide) .... Take 1 Tablet By Mouth Once A Day 2)  Benazepril-Hydrochlorothiazide 10-12.5 Mg  Tabs (Benazepril-Hydrochlorothiazide) .... Take 1 Tablet By Mouth Once A Day 3)  Advair Diskus 250-50 Mcg/dose  Misc (Fluticasone-Salmeterol) .Marland Kitchen.. 1 Inhalation Two Times A Day 4)  Ventolin Hfa 108 (90 Base) Mcg/act  Aers (Albuterol Sulfate) .... 2 Puffs Every 6 Hours As Needed For Shortness of Breath 5)  Albuterol Sulfate (2.5 Mg/70ml) 0.083% Nebu (Albuterol Sulfate) .... Nebulize 1 Vial Every 4 Hours As Needed For Shortness of Breath 6)  Levothyroxine Sodium 112 Mcg Tabs (Levothyroxine Sodium) .Marland Kitchen.. 1 Tab By Mouth Daily 7)  Singulair 10 Mg Tabs (Montelukast Sodium) .Marland Kitchen.. 1 Tab By Mouth  Daily 8)  Aspir-Low 81 Mg Tbec (Aspirin) .Marland Kitchen.. 1 Tab By Mouth Daily With Food 9)  Amoxicillin 500 Mg Caps (Amoxicillin) .... One Capsule By Mouth Three Times A Day For Infection  Allergies (verified): No Known Drug Allergies  Review of Systems General:  Complains of chills and fever. ENT:  Complains of earache; left ear with sharp pain. CV:  Denies chest pain or discomfort. Resp:  Complains of cough. GI:  Complains of nausea and vomiting; denies constipation.  Physical Exam  General:  well-developed.  sickly Head:  normocephalic.   Mouth:  pharynx pink and moist.   Lungs:  decreased bases rhonchi throughout Heart:  normal rate and regular rhythm.   Msk:  up to the exam table Neurologic:  alert & oriented X3.   Skin:  color normal.   Psych:  Oriented X3.     Impression & Recommendations:  Problem # 1:  ACUTE BRONCHITIS (ICD-466.0) reviewed dx wtih pt  nebulizer given in office The following medications were removed from the medication list:    Amoxicillin 500 Mg Caps (Amoxicillin) ..... One capsule by mouth three times a day for infection Her updated medication list for this problem includes:    Advair Diskus 250-50 Mcg/dose Misc (Fluticasone-salmeterol) .Marland Kitchen... 1 inhalation two  times a day    Ventolin Hfa 108 (90 Base) Mcg/act Aers (Albuterol sulfate) .Marland Kitchen... 2 puffs every 6 hours as needed for shortness of breath    Albuterol Sulfate (2.5 Mg/66ml) 0.083% Nebu (Albuterol sulfate) ..... Nebulize 1 vial every 4 hours as needed for shortness of breath    Singulair 10 Mg Tabs (Montelukast sodium) .Marland Kitchen... 1 tab by mouth daily    Amoxicillin 500 Mg Caps (Amoxicillin) ..... One capsule by mouth three times a day for infection    Promethazine-codeine 6.25-10 Mg/30ml Syrp (Promethazine-codeine) .Marland Kitchen..Marland Kitchen Two teaspoons by mouth two times a day as needed for cough  Orders: Depo- Medrol 80mg  (J1040) Admin of Therapeutic Inj  intramuscular or subcutaneous (62952)  Complete Medication List: 1)   Celexa 10 Mg Tabs (Citalopram hydrobromide) .... Take 1 tablet by mouth once a day 2)  Benazepril-hydrochlorothiazide 10-12.5 Mg Tabs (Benazepril-hydrochlorothiazide) .... Take 1 tablet by mouth once a day 3)  Advair Diskus 250-50 Mcg/dose Misc (Fluticasone-salmeterol) .Marland Kitchen.. 1 inhalation two times a day 4)  Ventolin Hfa 108 (90 Base) Mcg/act Aers (Albuterol sulfate) .... 2 puffs every 6 hours as needed for shortness of breath 5)  Albuterol Sulfate (2.5 Mg/40ml) 0.083% Nebu (Albuterol sulfate) .... Nebulize 1 vial every 4 hours as needed for shortness of breath 6)  Levothyroxine Sodium 112 Mcg Tabs (Levothyroxine sodium) .Marland Kitchen.. 1 tab by mouth daily 7)  Singulair 10 Mg Tabs (Montelukast sodium) .Marland Kitchen.. 1 tab by mouth daily 8)  Aspir-low 81 Mg Tbec (Aspirin) .Marland Kitchen.. 1 tab by mouth daily with food 9)  Amoxicillin 500 Mg Caps (Amoxicillin) .... One capsule by mouth three times a day for infection 10)  Promethazine-codeine 6.25-10 Mg/28ml Syrp (Promethazine-codeine) .... Two teaspoons by mouth two times a day as needed for cough  Other Orders: Pulse Oximetry (single measurment) (84132)  Patient Instructions: 1)  Eat soup, applesauce, bananas and drink plenty of liquids to stay hydrated. 2)  Take antibiotics as ordered - be sure to eat a cracker or something to coat stomach 3)  Burning in stomach may be due to acid buildup.  You still produce this even if you don't eat.  You can take nexium 40mg  by mouth before meals in the morning to see if you can decrease the burning until your appetite is back to normal. 4)  Rest Prescriptions: PROMETHAZINE-CODEINE 6.25-10 MG/5ML SYRP (PROMETHAZINE-CODEINE) Two teaspoons by mouth two times a day as needed for cough  #4 ounces x 0   Entered and Authorized by:   Lehman Prom FNP   Signed by:   Lehman Prom FNP on 08/08/2010   Method used:   Printed then faxed to ...       [facility] [address] [phone]* (retail)       [address]       [place], [place]  [address]       Ph: [phone] or [phone]       Fax: [phone]   RxID:   [id number] AMOXICILLIN 500 MG CAPS (AMOXICILLIN) One capsule by mouth three times a day for infection  #30 x 0   Entered and Authorized by:   Lehman Prom FNP   Signed by:   Lehman Prom FNP on 08/08/2010   Method used:   Printed then faxed to ...       [facility] [address] [phone]* (retail)       [address]  [place], [place]  [address]       Ph: [phone] or [phone]       Fax: [phone]   RxID:   [id number]    Medication Administration  Injection # 1:    Medication: Depo- Medrol 80mg     Diagnosis: ACUTE BRONCHITIS (ICD-466.0)    Route: IM    Site: RUOQ gluteus    Exp Date: 07/12    Lot #: [id number]    Mfr: ph07/12    Patient tolerated injection without complications    Given by: Levon Hedger (August 08, 2010 12:37 PM)  Orders Added: 1)  Est. Patient Level III [99213] 2)  Pulse Oximetry (single measurment) [94760] 3)  Depo- Medrol 80mg  [J1040] 4)  Admin of Therapeutic Inj  intramuscular or subcutaneous [96372]     Medication Administration  Injection # 1:    Medication: Depo- Medrol 80mg     Diagnosis: ACUTE BRONCHITIS (ICD-466.0)    Route: IM    Site: RUOQ gluteus    Exp Date: 07/12    Lot #: [id number]    Mfr: ph07/12    Patient tolerated injection without complications    Given by: Levon Hedger (August 08, 2010 12:37 PM)  Orders Added: 1)  Est. Patient Level III [34742] 2)  Pulse Oximetry (single measurment) [94760] 3)  Depo- Medrol 80mg  [J1040] 4)  Admin of Therapeutic Inj  intramuscular or subcutaneous [59563]

## 2010-08-29 ENCOUNTER — Telehealth (INDEPENDENT_AMBULATORY_CARE_PROVIDER_SITE_OTHER): Payer: Self-pay | Admitting: Internal Medicine

## 2010-08-29 DIAGNOSIS — D759 Disease of blood and blood-forming organs, unspecified: Secondary | ICD-10-CM | POA: Insufficient documentation

## 2010-08-31 ENCOUNTER — Encounter (INDEPENDENT_AMBULATORY_CARE_PROVIDER_SITE_OTHER): Payer: Self-pay | Admitting: Internal Medicine

## 2010-09-06 NOTE — Letter (Signed)
Summary: Lipid Letter  [facility]  [address] [place], [place] [address]   Phone: [phone]  Fax: [phone]    08/31/2010  [facility] [address] [place], [place]  [address]  Dear Janice Daniels:  We have carefully reviewed your last lipid profile from 08/12/2010 and the results are noted below with a summary of recommendations for lipid management.    Cholesterol:       169     Goal: < 200   HDL "good" Cholesterol:   30     Goal: >45   LDL "bad" Cholesterol:   118     Goal: <100   Triglycerides:       105     Goal: <150    Cholesterol panel is much, much better.  Still would like to see HDL above 45, however--Physical activity will help that most.  Thyroid and rest of labs okay except for a high platelet count (platelets help [place] make clots and scabs so we don't bleed too much from superficial wounds)  Our office should have already called you to come back in to retest this and make sure it is real.    TLC Diet (Therapeutic Lifestyle Change): Saturated Fats & Transfatty acids should be kept < 7% of total calories ***Reduce Saturated Fats Polyunstaurated Fat can be up to 10% of total calories Monounsaturated Fat Fat can be up to 20% of total calories Total Fat should be no greater than 25-35% of total calories Carbohydrates should be 50-60% of total calories Protein should be approximately 15% of total calories Fiber should be at least 20-30 grams a day ***Increased fiber may help lower LDL Total Cholesterol should be < 200mg /day Consider adding plant stanol/sterols to diet (example: Benacol spread) ***A higher intake of unsaturated fat may reduce Triglycerides and Increase HDL    Adjunctive Measures (may lower LIPIDS and reduce risk of Heart Attack) include: Aerobic Exercise (20-30 minutes 3-4 times a week) Limit Alcohol Consumption Weight Reduction Aspirin 75-81 mg a day by mouth (if not allergic or  contraindicated) Dietary Fiber 20-30 grams a day by mouth     Current Medications: 1)    Celexa 10 Mg  Tabs (Citalopram hydrobromide) .... Take 1 tablet by mouth once a day 2)    Benazepril-hydrochlorothiazide 10-12.5 Mg  Tabs (Benazepril-hydrochlorothiazide) .... Take 1 tablet by mouth once a day 3)    Advair Diskus 250-50 Mcg/dose  Misc (Fluticasone-salmeterol) .Marland Kitchen.. 1 inhalation two times a day 4)    Ventolin Hfa 108 (90 Base) Mcg/act  Aers (Albuterol sulfate) .... 2 puffs every 6 hours as needed for shortness of breath 5)    Albuterol Sulfate (2.5 Mg/44ml) 0.083% Nebu (Albuterol sulfate) .... Nebulize 1 vial every 4 hours as needed for shortness of breath 6)    Levothyroxine Sodium 112 Mcg Tabs (Levothyroxine sodium) .Marland Kitchen.. 1 tab by mouth daily 7)    Singulair 10 Mg Tabs (Montelukast sodium) .Marland Kitchen.. 1 tab by mouth daily 8)    Aspir-low 81 Mg Tbec (Aspirin) .Marland Kitchen.. 1 tab by mouth daily with food 9)    Amoxicillin 500 Mg Caps (Amoxicillin) .... One capsule by mouth three times a day for infection 10)    Promethazine-codeine 6.25-10 Mg/63ml Syrp (Promethazine-codeine) .... Two teaspoons by mouth two times a day as needed for cough 11)    Ranitidine Hcl 150 Mg Tabs (Ranitidine hcl) .Marland Kitchen.. 1 tab by mouth two times a day 1/2 hour before meals  If you  have any questions, please call. We appreciate being able to work with you.   Sincerely,    [facility] Julieanne Manson MD

## 2010-09-13 NOTE — Progress Notes (Signed)
Summary: Test results for recent bloodwork  Phone Note Call from Patient   Reason for Call: Talk to Nurse Summary of Call: Not yet received bloodword results--please mail VM rec'd pt. requesting lab results, can call after 1p or630pm./Lisa Strandberg RN Initial call taken by: Nicholaus Bloom,  August 29, 2010 8:26 AM  Follow-up for Phone Call        Left message with female at home number for pt. to return call.  Not yet signed -- sent to Dr. Delrae Alfred.  Dutch Quint RN  August 29, 2010 11:22 AM   Additional Follow-up for Phone Call Additional follow up Details #1::        Let her know her cholesterol is significantly improved, her thyroid is okay, rest of labs okay, except her platelets are a bit high. Please schedule her to come in for a repeat CBC to make sure this was not a lab error--will also need a "peripheral smear"  evaluated by pathologist ordered as well  --both for thrombocytosis I will send a letter so she can see her cholesterol results--they are much better Additional Follow-up by: Julieanne Manson MD,  August 31, 2010 10:56 AM  New Problems: THROMBOCYTOSIS (ICD-289.9)   Additional Follow-up for Phone Call Additional follow up Details #2::    Dr. Alphonsa Overall... called pt. and gave her the above info.... Tried to sched. her an appt. to have CBC and peripheral smear but the pt. said that she will be going to Dr. Hart Carwin since she now has MEDICARE and [employer].... [facility] is out of network and will have to pay more... She will call us if she wants to return but wants to try Dr. Parke Simmers first... She will relay this message to Dr. Parke Simmers... Hale Drone CMA  September 07, 2010 3:20 PM   Okay.  Julieanne Manson MD  September 08, 2010 3:05 PM   New Problems: THROMBOCYTOSIS (ICD-289.9)

## 2010-09-14 ENCOUNTER — Telehealth (INDEPENDENT_AMBULATORY_CARE_PROVIDER_SITE_OTHER): Payer: Self-pay | Admitting: Internal Medicine

## 2010-09-15 ENCOUNTER — Encounter (INDEPENDENT_AMBULATORY_CARE_PROVIDER_SITE_OTHER): Payer: Self-pay | Admitting: Internal Medicine

## 2010-09-22 NOTE — Assessment & Plan Note (Signed)
Summary: CBC, peripheral smear  Nurse Visit   Allergies: No Known Drug Allergies  Orders Added: 1)  Est. Patient Level I [99211] 2)  T-CBC w/Diff [[address]] 3)  T-RBC Smear [[address]-10080]

## 2010-09-22 NOTE — Progress Notes (Signed)
Summary: Lap appt, need orders  Phone Note Call from Patient Call back at Home Phone [phone]   Summary of Call: Needs lab orders, pt states had call to sched lab, please call after 2pm Initial call taken by: Ernestine Mcmurray,  September 14, 2010 9:28 AM  Follow-up for Phone Call        Pt. states was not satisifed with [facility] successor as provider -- needs to find new one.  Until then, will continue with [facility].  Lab appt. 09/15/10 for repeat CBC with diff and peripheral smear. Follow-up by: Dutch Quint RN,  September 14, 2010 3:46 PM

## 2010-09-27 NOTE — Progress Notes (Signed)
[facility]   Imported By: Arta Bruce 09/21/2010 14:56:05  _____________________________________________________________________  External Attachment:    Type:   Image     Comment:   External Document

## 2010-11-08 NOTE — H&P (Signed)
Janice Daniels, ABSHER NO.:  [id number]   MEDICAL RECORD NO.:  [id number]          PATIENT TYPE:  INP   LOCATION:  3310                         FACILITY:  [facility]   PHYSICIAN:  Pearlean Brownie, M.D.DATE OF BIRTH:  06/17/1946   DATE OF ADMISSION:  05/31/2008  DATE OF DISCHARGE:                              HISTORY & PHYSICAL   PRIMARY CARE PHYSICIAN:  Dr. Delrae Alfred at [facility]   CHIEF COMPLAINT:  Cough, shortness of breath, and wheezing.   HISTORY OF PRESENT ILLNESS:  This is a 65 year old white female with a  history of COPD who began with a low-grade fever and body aches on  May 26, 2008.  She developed a cough productive of green and yellow  sputum the following day and has been using her nebs more frequently at  home.  The cough and wheezing had persisted, but this morning her  breathing was even worse and she could not keep doing such frequent  breathing treatments at home, thus she came to emergency room for  further treatment.  She has also been having muscle spasms in her chest  with her coughing fits.   REVIEW OF SYSTEMS:  Significant for fever, chills, fatigue, cough,  dyspnea, wheezing, sputum production, myalgias, and arthralgias.  Review  of systems is otherwise negative for decreased appetite, weight change,  headache, sore throat, ear pain, rhinorrhea, chest pain, edema,  hemoptysis, nausea, vomiting, diarrhea, dysphagia, hematemesis, bloody  stools, melanotic stools, abdominal pain, dysuria, hematuria, rash,  headache, visual changes, weakness, numbness or tingling, petechiae,  bleeding, polyuria, or polydipsia.   ALLERGIES:  No known drug allergies.   MEDICATIONS:  1. Synthroid 112 mcg p.o. daily.  2. Benazepril/HCTZ 10/12.5 p.o. daily.  3. ProAir HFA inhaler 2 puffs every 4 hours as needed.  4. Albuterol 2.5 mg nebs every 4 hours as needed.  5. Citalopram 10 mg p.o. daily.   PAST MEDICAL HISTORY:  1. COPD with no prior  hospitalizations or home oxygen.  2. Hypertension.  3. Hypothyroidism.  4. Depression/anxiety.   PAST SURGICAL HISTORY:  1. The patient was status post tonsillectomy and adenoidectomy as a      child.  2. Status post hysterectomy and bilateral tubal ligation.  3. Status post normal spontaneous vaginal delivery x5.   SOCIAL HISTORY:  The patient lives with her husband.  She is retired.  She recently quit smoking, but has an extensive smoking history.  She  uses occasional alcohol and denies drug use.   FAMILY HISTORY:  The patient's mother had angina.  The patient's father  had esophageal cancer.   PHYSICAL EXAMINATION:  VITAL SIGNS:  Temperature is 100.6 on admission  and decreased to 98.3 after Tylenol, heart rate is 127 on admission and  decreased to 102 after Tylenol, respiratory rate 24-26, blood pressure  139/69 which decreased to 110/54, and pulse oxygen 96% on 4 L.  GENERAL:  The patient is awake, alert, and mildly uncomfortable.  HEENT:  Normocephalic and atraumatic.  Pupils equal, round, reactive to  light and accommodation.  Extraocular movements are intact.  Nares are  without deformity or discharge.  Moist mucous membranes.  CARDIOVASCULAR:  She was mildly tachycardic with no appreciable murmur  and 2+ dorsalis pedis pulses bilaterally.  LUNGS:  Diffuse expiratory wheezes throughout with no focal crackles or  rhonchi.  Slightly increased work of breathing.  ABDOMEN:  Normoactive bowel sounds.  It is soft, nontender, and  nondistended.  EXTREMITIES:  No clubbing, cyanosis, or edema and 2+ dorsalis pedis and  posterior tibialis pulses bilaterally.  NEUROLOGIC:  Cranial nerves II through XII are grossly intact and the  patient is alert and oriented x3.  MUSCULOSKELETAL:  No deformities.  SKIN:  No rash.   LABS AND STUDIES:  I-STAT 8 revealed sodium of 138, potassium 3.2,  bicarb 27, BUN 10, creatinine 1.2, glucose 144, and ionized calcium of  1.08.  CBC with  differential reveals a white blood cell count of 11.1,  hemoglobin of 12.9, platelets of 326, 72% neutrophils, and an absolute  neutrophil count of 8.0.  Point-of-cares were negative x1 with the  exception of myoglobin greater than 500.  ABG revealed pH of 7.476, PCO2  of 34.3, PO2 of 98.0, bicarb of 25.3, and oxygen saturation of 98%.  Chest x-ray shows chronic lung disease and no definite pneumonia.   ASSESSMENT AND PLAN:  This is a 65 year old white female with history of  chronic obstructive pulmonary disease admitted with the increased  shortness of breath, wheezing, and cough likely secondary to chronic  obstructive pulmonary disease exacerbation.  1. Chronic obstructive pulmonary disease exacerbation:  Seems likely      etiology of symptoms as chest x-ray is negative for pneumonia and      fevers have only been low-grade that is not very consistent with      influenza-like illness.  We will continue albuterol nebs as      scheduled every 2 hours with every 1 hour as needed as well as      Atrovent nebs q.i.d.  The patient received 1 dose of Solu-Medrol      and Avelox in the ED.  We will continue prednisone 40 mg p.o. daily      for a total of 10 days as well as Avelox 400 mg p.o. daily for a      total of 7 days.  We will give guaifenesin scheduled for cough as      well as Tessalon pearls as needed for sleep.  We will give oxygen      as needed to maintain sats 90%-95% but no greater.  The patient is      not on home oxygen, therefore, wean as tolerated.  The patient may      need inhaled corticosteroid at discharge as she was only on      albuterol prior to admission.  The patient's ABG in the emergency      department is stable.  2. Hypertension:  The patient recently quit smoking, and her blood      pressure is now normal in the emergency department.  We will does      hold her low-dose blood pressure medicine for now and continue to      follow with her during  hospitalization.  3. Hypothyroidism:  We will continue the patient's home dose of      Synthroid.  4. Depression/anxiety:  We will continue home dose of citalopram.  5. Elevated myoglobin:  We will check CK level.  Question whether this      may be due to extensive coughing fits.  6. Low-ionized calcium:  We will check complete metabolic panel to      assess albumin and repeat calcium as this  value as on i-STAT,      which tends to be less accurate.  7. Fluids, electrolytes, and nutrition/gastrointestinal:  We will      check complete metabolic panel.  The patient may need potassium      repletion.  We will place on heart-healthy diet once respiratory      status improves.  We will      saline lock IV.  8. Prophylaxis:  We will place the patient on Lovenox for venous      thromboembolism prophylaxis.  9. Disposition:  Pending improved respiratory status and wean of nebs      and oxygen requirement.      Drue Dun, M.D.  Electronically Signed      Pearlean Brownie, M.D.  Electronically Signed    EE/MEDQ  D:  05/31/2008  T:  06/01/2008  Job:  [id number]

## 2010-11-11 NOTE — Discharge Summary (Signed)
NAMEKAGAN, HIETPAS         ACCOUNT NO.:  [id number]   MEDICAL RECORD NO.:  [id number]          PATIENT TYPE:  INP   LOCATION:  3310                         FACILITY:  [facility]   PHYSICIAN:  Leighton Roach McDiarmid, M.D.DATE OF BIRTH:  [birth year]   DATE OF ADMISSION:  05/31/2008  DATE OF DISCHARGE:  06/01/2008                               DISCHARGE SUMMARY   PRIMARY CARE Isaias Dowson:  Marcene Duos, MD, [facility].   DISCHARGE DIAGNOSES:  1. Chronic obstructive pulmonary disease, acute exacerbation  2. Hypertension.  3. Hypothyroidism.  4. Depression/anxiety.  5. Tobacco abuse.  6. Hypokalemia.   DISCONTINUED MEDICATIONS:  None.   DISCHARGE MEDICATIONS:  1. Prednisone 40 mg p.o. daily x8 days.  2. Doxycycline 100 mg p.o. daily x9 days.  3. Advair 250/50 mcg 1 puff inhaled b.i.d.  4. Albuterol, ProAir inhaler 2 puffs q.4 h. p.r.n. shortness of      breath.  5. Citalopram 10 mg 1 p.o. daily.  6. Benazepril/hydrochlorothiazide 10/12.5 mg 1 p.o. daily.  7. Tessalon Perles 100 mg p.o. t.i.d. p.r.n. cough.  8. Synthroid 112 mcg 1 p.o. daily.  9. Albuterol 2.5 mg nebulizer q.4 h. p.r.n. shortness of breath.   CONSULTANTS:  None.   PROCEDURES:  None.   IMAGING:  Chest x-ray on May 31, 2008, impression:  Questionable  acute infectious process of right upper lung superimposed on chronic  lung disease, negative for edema.   DISCHARGE LABORATORY DATA:  TSH 0.407.  BMET, sodium 140, potassium 3.7,  chloride 107, CO2 26, glucose 186, BUN 10, creatinine 0.78.  CBC,  hemoglobin 12.1, hematocrit 35.7, platelets 296.  CK 185.   BRIEF HOSPITAL COURSE:  This is a 65 year old female with history of  COPD admitted for COPD exacerbation.  1. COPD.  The patient admitted with COPD exacerbation.  Chest x-ray      did show some probable infectious etiology.  The patient was      started on Avelox and given albuterol/Atrovent nebulizers q.i.d.      with q.1 h. p.r.n.  The patient was  also given supplementary oxygen      to maintain saturations of 90 and 95%.  Overnight, the patient was      weaned from oxygen and did not have any oxygen requirement prior to      discharge.  The patient received 1 day of Avelox; however, due to      insufficient finances, was switched to doxycycline 100 mg p.o. a      day for remainder of 10-day course of antibiotics.  The patient was      also continued on corticosteroids for exacerbation of COPD and will      complete 10-day course upon discharge.  The patient's short-acting      bronchodilator, albuterol was continued during inpatient status.      Of note because of new hospitalizations for COPD exacerbation,      primary team decided the patient will need inhale corticosteroids      for maintenance.  The patient was given Advair 250/50 mcg during      inpatient stay and discharged with the  medication.  Of note, the      patient has insufficient finances and case management was notified      to speak with the patient regarding receiving Advair for outpatient      therapy.  2. Hypertension.  The patient has history of hypertension.  Blood      pressures remained optimal, less than 140/80 during inpatient stay.      Initially, benazepril/hydrochlorothiazide was on hold as the      patient stated that she was to continue medication, as she has      recently quit smoking.  Blood pressures were stable off of      benazepril; however, primary team decided to continue      benazepril/hydrochlorothiazide and have the patient follow up with      PCP in 2 weeks with BP recheck, which then antihypertensive could      be discontinued at that time.  3. Hypokalemia.  The patient was hypokalemic upon admission, given      p.o. repletion of potassium.  Hypokalemia was resolved prior to      discharge.  4. Hypothyroidism.  The patient was continued on home Synthroid      dosage.  TSH was drawn during admission, which was within normal       limits.  5. Depression/anxiety.  The patient with a history of depression, was      prescribed Celexa 10 mg p.o. daily.  The patient stated she had not      been taking Celexa regularly due to new onset of sickness.      Explained importance of taking antidepressant on regular basis as      to avoid any serotonergic adverse reactions from discontinuing and      not taking medication properly.  6. Tobacco abuse.  Smoking cessation consult was done per the patient,      recently quit smoking approximately 5 days prior to admission.  The      patient was given information regarding continue smoking cessation      and will follow up with PCP regarding use of Chantix.  7. Elevated myoglobin/CK.  On admission, the patient had elevated      myoglobin to greater than 500; however, CK was at high end of      normal.  Elevated myoglobin was thought to be to excessive      coughing.  There was no evidence of MI.  No  evidence of      rhabdomyolysis.  No further workup was done.   DISCHARGE INSTRUCTIONS:  1. Heart-healthy diet.  2. Follow up with primary care Indigo Barbian in 2 weeks for assessment      status post COPD exacerbation.   FOLLOWUP APPOINTMENTS:  The patient is to follow with Dr. Julieanne Manson in 2-3 weeks at [facility], phone number is [phone].   DISCHARGE CONDITION:  Stable.   LOCATION:  Home.      Milinda Antis, MD  Electronically Signed      Leighton Roach McDiarmid, M.D.  Electronically Signed    KD/MEDQ  D:  06/02/2008  T:  06/02/2008  Job:  [id number]   cc:   Marcene Duos, M.D.

## 2011-03-20 ENCOUNTER — Other Ambulatory Visit (HOSPITAL_COMMUNITY): Payer: Self-pay | Admitting: Internal Medicine

## 2011-03-20 ENCOUNTER — Other Ambulatory Visit: Payer: Self-pay | Admitting: Internal Medicine

## 2011-03-20 DIAGNOSIS — R1013 Epigastric pain: Secondary | ICD-10-CM

## 2011-03-21 ENCOUNTER — Ambulatory Visit (HOSPITAL_COMMUNITY)
Admission: RE | Admit: 2011-03-21 | Discharge: 2011-03-21 | Disposition: A | Discharge status: Home / Self Care | Payer: Medicare HMO | Source: Ambulatory Visit | Attending: Internal Medicine | Admitting: Internal Medicine

## 2011-03-21 DIAGNOSIS — I10 Essential (primary) hypertension: Secondary | ICD-10-CM | POA: Insufficient documentation

## 2011-03-21 DIAGNOSIS — R1013 Epigastric pain: Secondary | ICD-10-CM

## 2011-03-22 ENCOUNTER — Emergency Department (HOSPITAL_COMMUNITY): Payer: Medicare HMO

## 2011-03-22 ENCOUNTER — Other Ambulatory Visit: Payer: Self-pay

## 2011-03-22 ENCOUNTER — Encounter (HOSPITAL_COMMUNITY): Payer: Self-pay | Admitting: Radiology

## 2011-03-22 ENCOUNTER — Emergency Department (HOSPITAL_COMMUNITY)
Admission: EM | Admit: 2011-03-22 | Discharge: 2011-03-22 | Disposition: A | Discharge status: Home / Self Care | Payer: Medicare HMO | Attending: Emergency Medicine | Admitting: Emergency Medicine

## 2011-03-22 DIAGNOSIS — R209 Unspecified disturbances of skin sensation: Secondary | ICD-10-CM | POA: Insufficient documentation

## 2011-03-22 DIAGNOSIS — R059 Cough, unspecified: Secondary | ICD-10-CM | POA: Insufficient documentation

## 2011-03-22 DIAGNOSIS — R05 Cough: Secondary | ICD-10-CM | POA: Insufficient documentation

## 2011-03-22 DIAGNOSIS — Z7982 Long term (current) use of aspirin: Secondary | ICD-10-CM | POA: Insufficient documentation

## 2011-03-22 DIAGNOSIS — Z79899 Other long term (current) drug therapy: Secondary | ICD-10-CM | POA: Insufficient documentation

## 2011-03-22 DIAGNOSIS — K5900 Constipation, unspecified: Secondary | ICD-10-CM | POA: Insufficient documentation

## 2011-03-22 DIAGNOSIS — F411 Generalized anxiety disorder: Secondary | ICD-10-CM | POA: Insufficient documentation

## 2011-03-22 DIAGNOSIS — J4489 Other specified chronic obstructive pulmonary disease: Secondary | ICD-10-CM | POA: Insufficient documentation

## 2011-03-22 DIAGNOSIS — R112 Nausea with vomiting, unspecified: Secondary | ICD-10-CM | POA: Insufficient documentation

## 2011-03-22 DIAGNOSIS — R222 Localized swelling, mass and lump, trunk: Secondary | ICD-10-CM | POA: Insufficient documentation

## 2011-03-22 DIAGNOSIS — J449 Chronic obstructive pulmonary disease, unspecified: Secondary | ICD-10-CM | POA: Insufficient documentation

## 2011-03-22 DIAGNOSIS — IMO0001 Reserved for inherently not codable concepts without codable children: Secondary | ICD-10-CM | POA: Insufficient documentation

## 2011-03-22 DIAGNOSIS — F17200 Nicotine dependence, unspecified, uncomplicated: Secondary | ICD-10-CM | POA: Insufficient documentation

## 2011-03-22 DIAGNOSIS — E039 Hypothyroidism, unspecified: Secondary | ICD-10-CM | POA: Insufficient documentation

## 2011-03-22 DIAGNOSIS — R55 Syncope and collapse: Secondary | ICD-10-CM | POA: Insufficient documentation

## 2011-03-22 DIAGNOSIS — R109 Unspecified abdominal pain: Secondary | ICD-10-CM | POA: Insufficient documentation

## 2011-03-22 HISTORY — DX: Essential (primary) hypertension: I10

## 2011-03-22 LAB — URINALYSIS, ROUTINE W REFLEX MICROSCOPIC
Bilirubin Urine: NEGATIVE
Hgb urine dipstick: NEGATIVE
Protein, ur: NEGATIVE mg/dL
Urobilinogen, UA: 0.2 mg/dL (ref 0.0–1.0)

## 2011-03-22 LAB — DIFFERENTIAL
Basophils Relative: 0 % (ref 0–1)
Eosinophils Absolute: 0 10*3/uL (ref 0.0–0.7)
Monocytes Absolute: 1.1 10*3/uL — ABNORMAL HIGH (ref 0.1–1.0)
Neutrophils Relative %: 84 % — ABNORMAL HIGH (ref 43–77)

## 2011-03-22 LAB — POCT I-STAT TROPONIN I: Troponin i, poc: 0 ng/mL (ref 0.00–0.08)

## 2011-03-22 LAB — CBC
MCH: 30.9 pg (ref 26.0–34.0)
Platelets: 369 10*3/uL (ref 150–400)
RBC: 4.17 MIL/uL (ref 3.87–5.11)

## 2011-03-22 LAB — POCT I-STAT, CHEM 8
BUN: 14 mg/dL (ref 6–23)
Calcium, Ion: 1.18 mmol/L (ref 1.12–1.32)
Chloride: 102 mEq/L (ref 96–112)
Creatinine, Ser: 1 mg/dL (ref 0.50–1.10)
Potassium: 3.7 mEq/L (ref 3.5–5.1)
TCO2: 24 mmol/L (ref 0–100)

## 2011-03-22 LAB — URINE MICROSCOPIC-ADD ON

## 2011-03-22 MED ORDER — IOHEXOL 300 MG/ML  SOLN
72.0000 mL | Freq: Once | INTRAMUSCULAR | Status: AC | PRN
  Administered 2011-03-22: 72 mL via INTRAVENOUS

## 2011-03-31 LAB — CBC
HCT: 35.7 % — ABNORMAL LOW (ref 36.0–46.0)
HCT: 38.4 % (ref 36.0–46.0)
Hemoglobin: 12.9 g/dL (ref 12.0–15.0)
MCHC: 33.8 g/dL (ref 30.0–36.0)
MCV: 97 fL (ref 78.0–100.0)
Platelets: 296 10*3/uL (ref 150–400)
RDW: 12.6 % (ref 11.5–15.5)
WBC: 11.1 10*3/uL — ABNORMAL HIGH (ref 4.0–10.5)

## 2011-03-31 LAB — DIFFERENTIAL
Eosinophils Relative: 0 % (ref 0–5)
Lymphocytes Relative: 8 % — ABNORMAL LOW (ref 12–46)
Lymphs Abs: 0.9 10*3/uL (ref 0.7–4.0)
Monocytes Absolute: 2.1 10*3/uL — ABNORMAL HIGH (ref 0.1–1.0)

## 2011-03-31 LAB — BASIC METABOLIC PANEL
BUN: 10 mg/dL (ref 6–23)
CO2: 26 mEq/L (ref 19–32)
Chloride: 107 mEq/L (ref 96–112)
Creatinine, Ser: 0.78 mg/dL (ref 0.4–1.2)
Glucose, Bld: 186 mg/dL — ABNORMAL HIGH (ref 70–99)

## 2011-03-31 LAB — POCT CARDIAC MARKERS: Myoglobin, poc: >500 ng/mL (ref 12–200)

## 2011-03-31 LAB — POCT I-STAT, CHEM 8
BUN: 10 mg/dL (ref 6–23)
Calcium, Ion: 1.08 mmol/L — ABNORMAL LOW (ref 1.12–1.32)
Creatinine, Ser: 1.2 mg/dL (ref 0.4–1.2)
TCO2: 27 mmol/L (ref 0–100)

## 2011-03-31 LAB — POCT I-STAT 3, ART BLOOD GAS (G3+)
Bicarbonate: 25.3 mEq/L — ABNORMAL HIGH (ref 20.0–24.0)
TCO2: 26 mmol/L (ref 0–100)
pCO2 arterial: 34.3 mmHg — ABNORMAL LOW (ref 35.0–45.0)
pH, Arterial: 7.476 — ABNORMAL HIGH (ref 7.350–7.400)

## 2011-03-31 LAB — COMPREHENSIVE METABOLIC PANEL
ALT: 19 U/L (ref 0–35)
AST: 23 U/L (ref 0–37)
Albumin: 3.1 g/dL — ABNORMAL LOW (ref 3.5–5.2)
CO2: 26 mEq/L (ref 19–32)
Calcium: 8.6 mg/dL (ref 8.4–10.5)
GFR calc Af Amer: >60 mL/min (ref >60)
GFR calc non Af Amer: >60 mL/min (ref >60)
Sodium: 137 mEq/L (ref 135–145)

## 2011-03-31 LAB — CK: Total CK: 185 U/L — ABNORMAL HIGH (ref 7–177)

## 2011-03-31 LAB — TSH: TSH: 0.407 u[IU]/mL (ref 0.350–4.500)

## 2011-04-10 ENCOUNTER — Encounter: Payer: Self-pay | Admitting: Pulmonary Disease

## 2011-04-11 ENCOUNTER — Encounter: Payer: Self-pay | Admitting: Pulmonary Disease

## 2011-04-11 ENCOUNTER — Ambulatory Visit (INDEPENDENT_AMBULATORY_CARE_PROVIDER_SITE_OTHER): Payer: Medicare HMO | Admitting: Pulmonary Disease

## 2011-04-11 VITALS — BP 118/72 | HR 100 | Temp 98.0°F | Ht 64.0 in | Wt 104.2 lb

## 2011-04-11 DIAGNOSIS — R918 Other nonspecific abnormal finding of lung field: Secondary | ICD-10-CM

## 2011-04-11 DIAGNOSIS — R222 Localized swelling, mass and lump, trunk: Secondary | ICD-10-CM

## 2011-04-11 MED ORDER — PREDNISONE 10 MG PO TABS: ORAL_TABLET | ORAL | Status: DC

## 2011-04-11 NOTE — Patient Instructions (Signed)
Stay on advair and albuterol as needed You must stop smoking Will treat you with a short course of prednisone. Will schedule for PET scan, and arrange followup once results available.

## 2011-04-11 NOTE — Progress Notes (Signed)
  Subjective:    Patient ID: Janice Daniels, female    DOB: 09/25/1945, 65 y.o.   MRN: [id number]  HPI The patient is a 65 year old female who I've been asked to see for an abnormal CT chest.  She recently had an abnormal chest x-ray, and a CT chest verified the presence of a 3 cm mass in the right upper lobe with adjacent hilar lymphadenopathy.  There are also satellite nodules in the right upper lobe as well.  The patient is a long history of tobacco abuse, and unfortunately continues to smoke.  She has lost about 20 pounds in the last 7 months, and admits to having a decreased appetite.  She denies having any hemoptysis.  She has significant dyspnea on exertion at less than one block, we'll get winded doing light housework.  She is currently on Advair as well as as needed albuterol.   Review of Systems  Constitutional: Positive for appetite change and unexpected weight change. Negative for fever.  HENT: Negative for ear pain, nosebleeds, congestion, sore throat, rhinorrhea, sneezing, trouble swallowing, dental problem, postnasal drip and sinus pressure.   Eyes: Negative for redness and itching.  Respiratory: Positive for cough and shortness of breath. Negative for chest tightness and wheezing.   Cardiovascular: Negative for palpitations and leg swelling.  Gastrointestinal: Positive for abdominal pain. Negative for nausea and vomiting.  Genitourinary: Negative for dysuria.  Musculoskeletal: Positive for joint swelling.  Skin: Negative for rash.  Neurological: Negative for headaches.  Hematological: Does not bruise/bleed easily.  Psychiatric/Behavioral: Negative for dysphoric mood. The patient is nervous/anxious.        Objective:   Physical Exam Constitutional:  Well developed, no acute distress  HENT:  Nares patent without discharge  Oropharynx without exudate, palate and uvula are normal  Eyes:  Perrla, eomi, no scleral icterus  Neck:  No JVD, no TMG  Cardiovascular:   Normal rate, regular rhythm, no rubs or gallops.  No murmurs        Intact distal pulses  Pulmonary :  decreased breath sounds, no stridor or respiratory distress   No rales.  +rhonchi and wheezing diffusely  Abdominal:  Soft, nondistended, bowel sounds present.  No tenderness noted.   Musculoskeletal:  No lower extremity edema noted.  Lymph Nodes:  No cervical lymphadenopathy noted  Skin:  No cyanosis noted  Neurologic:  Alert, appropriate, moves all 4 extremities without obvious deficit.         Assessment & Plan:

## 2011-04-11 NOTE — Assessment & Plan Note (Signed)
The patient has any lung mass involving the right upper lobe, with adjacent hilar lymphadenopathy.  This is most consistent with a bronchogenic cancer, and she will need a PET scan for staging.  This can be followed up with fiberoptic bronchoscopy for tissue diagnosis.  She is having active wheezing on exam today, and therefore I will treat her with a short course of prednisone.  But I have urged her to totally stop smoking.

## 2011-04-18 ENCOUNTER — Other Ambulatory Visit (HOSPITAL_COMMUNITY): Payer: Medicare HMO

## 2011-04-20 ENCOUNTER — Encounter (HOSPITAL_COMMUNITY)
Admission: RE | Admit: 2011-04-20 | Discharge: 2011-04-20 | Disposition: A | Discharge status: Home / Self Care | Payer: Medicare HMO | Source: Ambulatory Visit | Attending: Pulmonary Disease | Admitting: Pulmonary Disease

## 2011-04-20 DIAGNOSIS — R222 Localized swelling, mass and lump, trunk: Secondary | ICD-10-CM | POA: Insufficient documentation

## 2011-04-20 DIAGNOSIS — R918 Other nonspecific abnormal finding of lung field: Secondary | ICD-10-CM

## 2011-04-20 DIAGNOSIS — R599 Enlarged lymph nodes, unspecified: Secondary | ICD-10-CM | POA: Insufficient documentation

## 2011-04-20 MED ORDER — FLUDEOXYGLUCOSE F - 18 (FDG) INJECTION
17.0000 | Freq: Once | INTRAVENOUS | Status: AC | PRN
  Administered 2011-04-20: 17 via INTRAVENOUS

## 2011-04-25 ENCOUNTER — Telehealth: Payer: Self-pay | Admitting: Pulmonary Disease

## 2011-04-25 NOTE — Telephone Encounter (Addendum)
Please advise on PET results. Carron Curie, CMA I advised the pt that [facility] was out of office last week so this has delayed his response. Carron Curie, CMA

## 2011-04-25 NOTE — Telephone Encounter (Signed)
I have called pt yesterday and also today and got machine LMOM to call us with a number we can reach her, but did not leave test results. Pt needs bronchoscopy for diagnosis, and I need to discuss this with her.   Janice Daniels, what is the best way to handle this?

## 2011-04-26 NOTE — Telephone Encounter (Signed)
Discussed results with pt. Needs fob, ?next wed? Megan, see if there is a spot.  Thanks.

## 2011-04-26 NOTE — Telephone Encounter (Signed)
Patient returning call.  [phone]  Patient can be reached from now until 11:00am.  Then after 1:00-4:00pm or after 6:30pm.

## 2011-04-27 NOTE — Telephone Encounter (Signed)
Called and spoke with Janice Daniels from [facility] and scheduled pt for bronch Nov 7,2012 at 7:30 am.    Called and spoke with pt.  Informed her of bronch date 05/03/11. [facility] admitting at 6:15 am.  NPO after midnight.  No asa 3 days prior to procedure. And will need someone with her to bring her home after procedure.  Pt verbalized understanding and denied any questions.    Also mailed pt the bronch form with all this information on it.

## 2011-05-02 ENCOUNTER — Encounter (HOSPITAL_COMMUNITY): Payer: Self-pay | Admitting: Certified Registered Nurse Anesthetist

## 2011-05-03 ENCOUNTER — Encounter (HOSPITAL_COMMUNITY)
Admission: RE | Disposition: A | Discharge status: Home / Self Care | Payer: Self-pay | Source: Ambulatory Visit | Attending: Pulmonary Disease

## 2011-05-03 ENCOUNTER — Encounter (HOSPITAL_COMMUNITY): Payer: Self-pay | Admitting: Respiratory Therapy

## 2011-05-03 ENCOUNTER — Ambulatory Visit (HOSPITAL_COMMUNITY)
Admission: RE | Admit: 2011-05-03 | Discharge: 2011-05-03 | Disposition: A | Discharge status: Home / Self Care | Payer: Medicare Other | Source: Ambulatory Visit | Attending: Pulmonary Disease | Admitting: Pulmonary Disease

## 2011-05-03 ENCOUNTER — Ambulatory Visit (HOSPITAL_COMMUNITY): Payer: Medicare Other

## 2011-05-03 DIAGNOSIS — R918 Other nonspecific abnormal finding of lung field: Secondary | ICD-10-CM | POA: Insufficient documentation

## 2011-05-03 DIAGNOSIS — R222 Localized swelling, mass and lump, trunk: Secondary | ICD-10-CM | POA: Insufficient documentation

## 2011-05-03 HISTORY — PX: FIBEROPTIC BRONCHOSCOPY: SHX5367

## 2011-05-03 SURGERY — RADIOLOGY RECOVERY
Anesthesia: Moderate Sedation

## 2011-05-03 SURGERY — Surgical Case
Anesthesia: *Unknown

## 2011-05-03 MED ORDER — PHENYLEPHRINE HCL 0.25 % NA SOLN
1.0000 | Freq: Four times a day (QID) | NASAL | Status: DC | PRN
  Administered 2011-05-03: 1 via NASAL
  Filled 2011-05-03: qty 15

## 2011-05-03 MED ORDER — SODIUM CHLORIDE 0.9 % IV SOLN
Freq: Once | INTRAVENOUS | Status: AC
  Administered 2011-05-03: 07:00:00 via INTRAVENOUS

## 2011-05-03 MED ORDER — MIDAZOLAM HCL 10 MG/2ML IJ SOLN
7.5000 mg | Freq: Once | INTRAMUSCULAR | Status: AC
  Administered 2011-05-03: 7.5 mg via INTRAVENOUS

## 2011-05-03 MED ORDER — MEPERIDINE HCL 100 MG/ML IJ SOLN
50.0000 mg | Freq: Once | INTRAMUSCULAR | Status: AC
  Administered 2011-05-03: 50 mg via INTRAVENOUS

## 2011-05-03 MED ORDER — MEPERIDINE HCL 100 MG/ML IJ SOLN
INTRAMUSCULAR | Status: AC
  Filled 2011-05-03: qty 2

## 2011-05-03 MED ORDER — LIDOCAINE HCL 2 % EX GEL
Freq: Once | CUTANEOUS | Status: AC
  Administered 2011-05-03: 4 via TOPICAL

## 2011-05-03 MED ORDER — MIDAZOLAM HCL 10 MG/2ML IJ SOLN
INTRAMUSCULAR | Status: AC
  Filled 2011-05-03: qty 4

## 2011-05-03 NOTE — Progress Notes (Signed)
05/03/11 0751  Oxygen Therapy  O2 Device Nasal cannula  O2 Flow Rate (L/min) 4 L/min  Pulse Oximetry Type Continuous  SpO2 Alarm Limit Low 92   LOC  Level of Consciousness Alert  Orientation Level Oriented X4   more medicine given

## 2011-05-03 NOTE — Progress Notes (Signed)
05/03/11 1011  Vitals  Pulse Rate Source Monitor  ECG Heart Rate 75   Resp 13   BP 124/75 mmHg  Oxygen Therapy  O2 Device None (Room air)  Pulse Oximetry Type Continuous  SpO2 Alarm Limit Low 92   LOC  Level of Consciousness Alert  Orientation Level Oriented X4   discharged

## 2011-05-03 NOTE — Progress Notes (Signed)
05/03/11 0806  Vitals  Pulse Rate Source Monitor  ECG Heart Rate 91   Resp ! 23   BP ! 153/73 mmHg  Oxygen Therapy  O2 Device Nasal cannula  O2 Flow Rate (L/min) 4 L/min  Pulse Oximetry Type Continuous  SpO2 Alarm Limit Low 92   LOC  Level of Consciousness Other (Comment)  Orientation Level Other (Comment)   more med. Given pt. agitated

## 2011-05-03 NOTE — Progress Notes (Signed)
05/03/11 0837  Vitals  Pulse Rate Source Monitor  ECG Heart Rate 86   Resp ! 24   BP ! 148/74 mmHg  Oxygen Therapy  O2 Device Nasal cannula  O2 Flow Rate (L/min) 4 L/min  Pulse Oximetry Type Continuous  SpO2 Alarm Limit Low 92   LOC  Level of Consciousness Alert  Orientation Level Oriented X4  prod.end start of recovery

## 2011-05-03 NOTE — Progress Notes (Signed)
Late Entry.  Devices not working to transfer vitals to [facility] Flow sheet. See Shadow chart for vitals during procedure and recovery

## 2011-05-03 NOTE — Op Note (Signed)
NAMEMARINE, LEZOTTE NO.:  [id number]  MEDICAL RECORD NO.:  [id number]  LOCATION:                               FACILITY:  [facility]  PHYSICIAN:  Barbaraann Share, MD,FCCPDATE OF BIRTH:  26-Feb[birth year]1945  DATE OF PROCEDURE: DATE OF DISCHARGE:                              OPERATIVE REPORT   PROCEDURE:  Flexible fiberoptic bronchoscopy with video.  OPERATOR:  Barbaraann Share, MD,FCCP  ANESTHESIA:  Versed 7.5 mg in various aliquots, Demerol 50 mg IV, topical 1% lidocaine to vocal cords and airways during the procedure.  DESCRIPTION:  After obtaining informed consent, and also after doing a preprocedure exam that was unchanged from her original, the fiberoptic scope was passed through the right naris and into the posterior pharynx where there was no lesions or other abnormalities seen.  Vocal cords appeared to be within normal limits and moved bilaterally on phonation. Scope was then passed into the trachea where it was examined along its entire length down to the level of the carina, all of which was normal. The left tracheobronchial tree was examined serially.  The subsegmental level with no endobronchial abnormality being found.  Attention was then paid to the right tracheobronchial tree where the right upper lobe showed mild cobblestoning in the abnormal mucosa at the entrance to the right upper lobe orifice, the anterior and posterior segments of the right upper lobe were totally normal; however, there was an obstructing type process in a subsegment of the posterior segment of the right upper lobe.  It was unclear whether this was a mass or a mucoid concretion. The scope was then passed into the bronchus intermedius and the right middle lobe and right lower lobe bronchi were examined to the subsegmental level with no endobronchial abnormality being found. Bronchial washes, brushes, as well as endobronchial biopsies were done from the orifice to the right upper lobe as  well as the posterior segment of the right upper lobe with good specimens being obtained. Transbronchial needle aspirations were also done in the area of abnormal mucosa at the orifice to the right upper lobe.  Overall, the patient tolerated the procedure well and there were no immediate complications. Chest x-ray is pending at time of dictation to rule out pneumothorax post biopsy.  The patient went to the recovery area in stable condition.     Barbaraann Share, MD,FCCP     [facility]/MEDQ  D:  05/03/2011  T:  05/03/2011  Job:  [id number]

## 2011-05-03 NOTE — Progress Notes (Signed)
05/03/11 0750  Vitals  Pulse Rate Source Monitor  ECG Heart Rate 73   Resp 15   BP ! 164/83 mmHg  SpO2 99 %  Oxygen Therapy  O2 Device Nasal cannula  O2 Flow Rate (L/min) 4 L/min  Pulse Oximetry Type Continuous  SpO2 Alarm Limit Low 92    beginning of procedures first med.given

## 2011-05-03 NOTE — Procedures (Signed)
Dictation number [id number].

## 2011-05-05 ENCOUNTER — Encounter: Payer: Self-pay | Admitting: Pulmonary Disease

## 2011-05-05 ENCOUNTER — Telehealth: Payer: Self-pay | Admitting: *Deleted

## 2011-05-05 ENCOUNTER — Ambulatory Visit (INDEPENDENT_AMBULATORY_CARE_PROVIDER_SITE_OTHER): Payer: Medicare Other | Admitting: Pulmonary Disease

## 2011-05-05 ENCOUNTER — Telehealth: Payer: Self-pay | Admitting: Internal Medicine

## 2011-05-05 VITALS — BP 132/82 | HR 86 | Temp 98.3°F | Ht 64.0 in | Wt 108.4 lb

## 2011-05-05 DIAGNOSIS — R918 Other nonspecific abnormal finding of lung field: Secondary | ICD-10-CM

## 2011-05-05 DIAGNOSIS — J449 Chronic obstructive pulmonary disease, unspecified: Secondary | ICD-10-CM

## 2011-05-05 DIAGNOSIS — R222 Localized swelling, mass and lump, trunk: Secondary | ICD-10-CM

## 2011-05-05 DIAGNOSIS — J4489 Other specified chronic obstructive pulmonary disease: Secondary | ICD-10-CM

## 2011-05-05 DIAGNOSIS — Z23 Encounter for immunization: Secondary | ICD-10-CM

## 2011-05-05 LAB — CULTURE, RESPIRATORY W GRAM STAIN: Special Requests: NORMAL

## 2011-05-05 NOTE — Assessment & Plan Note (Signed)
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  Will refer her to oncology for evaluation.

## 2011-05-05 NOTE — Telephone Encounter (Signed)
Added new pt appt for 11/13 @ 10 am. Per pof attached to chart dana to contact pt. Message sent to dana confirming appt has been made and pt is to be here 11/13 @ 9:30 am.

## 2011-05-05 NOTE — Progress Notes (Signed)
  Subjective:    Patient ID: Janice Daniels, female    DOB: 08/17/1945, 65 y.o.   MRN: [id number]  HPI The patient comes in today for followup after her recent bronchoscopy for a right upper lobe mass.  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  I have reviewed the results with her, and answered all of her questions.  Since the last visit, the patient has quit smoking, and I congratulated her on this.   Review of Systems  Constitutional: Negative for fever and unexpected weight change.  HENT: Positive for congestion, sore throat and sneezing. Negative for ear pain, nosebleeds, rhinorrhea, trouble swallowing, dental problem, postnasal drip and sinus pressure.   Eyes: Negative for redness and itching.  Respiratory: Positive for cough and wheezing. Negative for chest tightness and shortness of breath.   Cardiovascular: Negative for palpitations and leg swelling.  Gastrointestinal: Negative for nausea and vomiting.  Genitourinary: Negative for dysuria.  Musculoskeletal: Negative for joint swelling.  Skin: Negative for rash.  Neurological: Positive for headaches.  Hematological: Does not bruise/bleed easily.  Psychiatric/Behavioral: Negative for dysphoric mood. The patient is not nervous/anxious.        Objective:   Physical Exam Thin female in no acute distress Nose without discharge or purulence noted Chest with a few rhonchi, no wheezes Lower extremities without edema, no cyanosis noted Alert and oriented, moves all 4 extremities.       Assessment & Plan:

## 2011-05-05 NOTE — Telephone Encounter (Signed)
Called and talked with pt regarding appt.  05/09/11 at 10:00 with Dr. Arbutus Ped.  Pt aware of time and place. Pt verbalized understanding.

## 2011-05-05 NOTE — Assessment & Plan Note (Signed)
Presumed diagnosis, has never had pfts.  She has quit smoking, and will schedule her for full pfts.  She is to stay on advair for now.

## 2011-05-05 NOTE — Patient Instructions (Addendum)
Will give you the pneumonia vaccine today Stay on advair for now, stay off cigarettes (you are doing well) Will refer you to oncology for evaluation of your cancer Will schedule for breathing tests in next 4 weeks, with followup with me the same day for review.

## 2011-05-09 ENCOUNTER — Encounter: Payer: Self-pay | Admitting: Internal Medicine

## 2011-05-09 ENCOUNTER — Ambulatory Visit: Payer: Medicare Other

## 2011-05-09 ENCOUNTER — Encounter: Payer: Self-pay | Admitting: *Deleted

## 2011-05-09 ENCOUNTER — Other Ambulatory Visit (HOSPITAL_BASED_OUTPATIENT_CLINIC_OR_DEPARTMENT_OTHER): Payer: Medicare Other

## 2011-05-09 ENCOUNTER — Ambulatory Visit (HOSPITAL_BASED_OUTPATIENT_CLINIC_OR_DEPARTMENT_OTHER): Payer: Medicare Other | Admitting: Internal Medicine

## 2011-05-09 VITALS — BP 145/67 | HR 101 | Temp 98.3°F | Ht 63.5 in | Wt 105.9 lb

## 2011-05-09 DIAGNOSIS — C3410 Malignant neoplasm of upper lobe, unspecified bronchus or lung: Secondary | ICD-10-CM

## 2011-05-09 DIAGNOSIS — C3490 Malignant neoplasm of unspecified part of unspecified bronchus or lung: Secondary | ICD-10-CM

## 2011-05-09 HISTORY — DX: Malignant neoplasm of unspecified part of unspecified bronchus or lung: C34.90

## 2011-05-09 NOTE — Progress Notes (Signed)
REASON FOR CONSULTATION:  65 years old white female diagnosed with lung cancer.  HPI Janice Daniels is a 65 years old female with long history of smoking and past medical history significant for hypertension, hypothyroidism, COPD and GERD. In September of 2012 the patient has some dizzy spells and passed out at home. She was seen by her primary care physician Dr. Ivery Quale. Chest x-ray was performed and showed abnormal mass in the right lung. This was followed by CT scan of the chest, abdomen and pelvis on 03/22/2011. It showed right upper lobe mass (5.3 x 3.6 x 1.9 cm) is highly suspicious for primary bronchogenic carcinoma. There is associated pathologic right hilar lymphadenopathy. Bronchial wall thickening and possible postobstructive pneumonitis in the apical right upper lobe. There was also a ground-glass nodule right upper lobe measures 5 mm and may be due to a metastatic nodule, or airspace disease and emphysema.  No evidence of abdominal or pelvic metastatic disease. No acute findings in the abdomen pelvis. The patient then had a PET scan on 04/11/2011 and it showed the right upper lobe lung mass and adjacent right hilar adenopathy demonstrates neoplastic range FDG uptake. A smaller right infrahilar lymph node is also FDG positive. No contralateral lymphadenopathy. wo small upper lobe lung lesions are weakly FDG positive. Recommend close observation. No findings for metastatic disease involving the neck, abdomen, pelvis or osseous structures. On 05/03/2011 the patient underwent negative bronchoscopy under the care of Dr. Shelle Iron and the final pathology was consistent with small cell carcinoma. The plan is kindly referred the patient to me today for evaluation and recommendation regarding treatment of her condition. When seen today she was very anxious the patient denied having any significant complaints except for weight loss of around 20 pounds in the last 6 months. She is also feeling tired  and has occasional cough and occasional right-sided chest pain. No significant nausea or vomiting. No visual changes and no neurological abnormalities.    Past Medical History  Diagnosis Date  . Hypertension   . Hypothyroid   . GERD (gastroesophageal reflux disease)   . COPD (chronic obstructive pulmonary disease)   . Low back pain   . Lung cancer 05/09/2011    Past Surgical History  Procedure Date  . Tubal ligation 1984  . Total abdominal hysterectomy 1986  . Tonsillectomy     Family History  Problem Relation Age of Onset  . Esophageal cancer Father   . Hypothyroidism Father   . Heart disease Mother   . Hypertension Mother   . Hyperlipidemia Mother   . Other Brother     low back pain  . Other Brother     hypochondriasis  . Other Brother     suicide  . Esophageal cancer Brother   . Cervical cancer Sister   . Hypertension Sister   . Diabetes Sister     Social History History  Substance Use Topics  . Smoking status: Former Smoker -- 1.5 packs/day for 50 years    Types: Cigarettes    Quit date: 04/27/2011  . Smokeless tobacco: Not on file  . Alcohol Use: No    Allergies  Allergen Reactions  . Codeine     Elevated pulse    Current Outpatient Prescriptions  Medication Sig Dispense Refill  . albuterol (PROVENTIL) (2.5 MG/3ML) 0.083% nebulizer solution Take 2.5 mg by nebulization every 4 (four) hours as needed.        Marland Kitchen albuterol (VENTOLIN HFA) 108 (90 BASE) MCG/ACT inhaler Inhale 2  puffs into the lungs every 6 (six) hours as needed.        . benazepril-hydrochlorthiazide (LOTENSIN HCT) 10-12.5 MG per tablet Take 1 tablet by mouth daily as needed.       . citalopram (CELEXA) 10 MG tablet Take 10 mg by mouth daily.        . Fluticasone-Salmeterol (ADVAIR) 250-50 MCG/DOSE AEPB Inhale 1 puff into the lungs 2 (two) times daily.        Marland Kitchen levothyroxine (SYNTHROID, LEVOTHROID) 112 MCG tablet Take 112 mcg by mouth daily.        . Multiple Vitamin (MULTIVITAMIN) tablet  Take 1 tablet by mouth daily.        Marland Kitchen omeprazole (PRILOSEC OTC) 20 MG tablet Take 20 mg by mouth daily.       . roflumilast (DALIRESP) 500 MCG TABS tablet Take 500 mcg by mouth daily.          Review of Systems  A comprehensive review of systems was negative except for: Constitutional: positive for anorexia, fatigue and weight loss Respiratory: positive for cough  Physical Exam  EAV:WUJWJ, no distress and malnourished SKIN: skin color, texture, turgor are normal, no rashes or significant lesions HEAD: Normocephalic, No masses, lesions, tenderness or abnormalities EYES: normal, PERRLA, Conjunctiva are pink and non-injected EARS: External ears normal OROPHARYNX:no exudate and no erythema  NECK: no adenopathy LYMPH:  no palpable lymphadenopathy BREAST:not examined LUNGS: clear to auscultation and percussion HEART: regular rate & rhythm, no murmurs and no gallops ABDOMEN:abdomen soft, non-tender and normal bowel sounds BACK: Back symmetric, no curvature. EXTREMITIES:no edema, no skin discoloration, no cyanosis  NEURO: alert & oriented x 3 with fluent speech, no focal motor/sensory deficits, gait normal  Assessment: This is a very pleasant 65 years old white female diagnosed with limited stage small cell lung cancer. I have a lengthy discussion with the patient and her husband today about her disease stage, prognosis and treatment options.  Plan: #1 complete his staging workup I ordered an MRI of the brain with and without contrast to rule out brain metastases. #2 I discussed with the patient her treatment options including systemic chemotherapy with carboplatin and etoposide versus palliative care. The patient is interested in the chemotherapy. She would be treated with carboplatin for AUC of 5 on day 1 and etoposide 120 mg/M2 on days 1,2 and 3 with Neulasta support on day 4. I discussed with the patient the adverse effects of this treatment including but not limited to alopecia,  myelosuppression, nausea or vomiting, liver or renal dysfunction. The patient would like to proceed with treatment as planned. She would have a chemotherapy education class giving you next week. The patient expected to start the first cycle of her chemotherapy next week. #3 the patient was given a prescription today for Compazine 10 mg by mouth every 6 hours as needed for nausea in addition to Medrol dose pak for appetite. #4 I referred the patient to addition oncology for consideration of concurrent radiotherapy. #5 the patient come back for followup visit in 2 weeks for evaluation and management any adverse effect of her chemotherapy.  All questions were answered. The patient knows to call the clinic with any problems, questions or concerns. We can certainly see the patient much sooner if necessary.  Thank you so much for allowing me to participate in the care of Janice Daniels. I will continue to follow up the patient with you and assist in her care.  I spent 30 minutes counseling  the patient face to face. The total time spent in the appointment was 60 minutes.   Janice Kuehnel K. 05/09/2011, 1:15 PM

## 2011-05-09 NOTE — Progress Notes (Unsigned)
Saw pt and her husband at [facility].  Pt educational/resource information given

## 2011-05-11 ENCOUNTER — Encounter: Payer: Self-pay | Admitting: *Deleted

## 2011-05-11 ENCOUNTER — Other Ambulatory Visit: Payer: Medicare Other

## 2011-05-11 ENCOUNTER — Telehealth: Payer: Self-pay | Admitting: Internal Medicine

## 2011-05-11 NOTE — Telephone Encounter (Signed)
S/w pt today re appt for mri @ [facility] 11/20 @ 9 am. Pt also aware other appts have been scheduled and will get schedule for nov thru jan when she comes in for ched today. Sheralyn Boatman @ [facility] made aware pt has permament bridge that contains medal. Per [facility] informed.

## 2011-05-15 ENCOUNTER — Telehealth: Payer: Self-pay | Admitting: Oncology

## 2011-05-15 ENCOUNTER — Other Ambulatory Visit: Payer: Self-pay | Admitting: Internal Medicine

## 2011-05-15 ENCOUNTER — Ambulatory Visit (HOSPITAL_BASED_OUTPATIENT_CLINIC_OR_DEPARTMENT_OTHER): Payer: Medicare Other

## 2011-05-15 ENCOUNTER — Encounter: Payer: Self-pay | Admitting: *Deleted

## 2011-05-15 DIAGNOSIS — C3490 Malignant neoplasm of unspecified part of unspecified bronchus or lung: Secondary | ICD-10-CM

## 2011-05-15 DIAGNOSIS — Z5111 Encounter for antineoplastic chemotherapy: Secondary | ICD-10-CM

## 2011-05-15 DIAGNOSIS — C3410 Malignant neoplasm of upper lobe, unspecified bronchus or lung: Secondary | ICD-10-CM

## 2011-05-15 LAB — BASIC METABOLIC PANEL
Chloride: 104 mEq/L (ref 96–112)
Creatinine, Ser: 0.77 mg/dL (ref 0.50–1.10)
Potassium: 3.3 mEq/L — ABNORMAL LOW (ref 3.5–5.3)
Sodium: 139 mEq/L (ref 135–145)

## 2011-05-15 MED ORDER — SODIUM CHLORIDE 0.9 % IV SOLN
120.0000 mg/m2 | Freq: Once | INTRAVENOUS | Status: AC
  Administered 2011-05-15: 180 mg via INTRAVENOUS
  Filled 2011-05-15: qty 9

## 2011-05-15 MED ORDER — ONDANSETRON 16 MG/50ML IVPB (CHCC)
16.0000 mg | Freq: Once | INTRAVENOUS | Status: AC
  Administered 2011-05-15: 16 mg via INTRAVENOUS

## 2011-05-15 MED ORDER — SODIUM CHLORIDE 0.9 % IV SOLN
Freq: Once | INTRAVENOUS | Status: AC
  Administered 2011-05-15: 16:00:00 via INTRAVENOUS

## 2011-05-15 MED ORDER — SODIUM CHLORIDE 0.9 % IV SOLN
397.5000 mg | Freq: Once | INTRAVENOUS | Status: AC
  Administered 2011-05-15: 400 mg via INTRAVENOUS
  Filled 2011-05-15: qty 40

## 2011-05-15 MED ORDER — DEXAMETHASONE SODIUM PHOSPHATE 4 MG/ML IJ SOLN
20.0000 mg | Freq: Once | INTRAMUSCULAR | Status: AC
  Administered 2011-05-15: 20 mg via INTRAVENOUS

## 2011-05-15 NOTE — Patient Instructions (Signed)
OK to take nausea meds tonight at bedtime and again in AM to prevent nausea as much as possible. Pt aware of appt time 05/16/11. Call for any problems [phone].

## 2011-05-15 NOTE — Progress Notes (Signed)
Pt was very upset in chemotherapy room regarding delay.  I spoke with her twice regarding delay and other concerns.

## 2011-05-15 NOTE — Telephone Encounter (Signed)
Received EPP application without full current bank statement. Called Mrs. Capote, no answer. Left message to call me back.

## 2011-05-16 ENCOUNTER — Other Ambulatory Visit: Payer: Self-pay | Admitting: Internal Medicine

## 2011-05-16 ENCOUNTER — Telehealth: Payer: Self-pay | Admitting: *Deleted

## 2011-05-16 ENCOUNTER — Ambulatory Visit (HOSPITAL_COMMUNITY)
Admission: RE | Admit: 2011-05-16 | Discharge: 2011-05-16 | Disposition: A | Discharge status: Home / Self Care | Payer: Medicare Other | Source: Ambulatory Visit | Attending: Internal Medicine | Admitting: Internal Medicine

## 2011-05-16 ENCOUNTER — Ambulatory Visit (HOSPITAL_BASED_OUTPATIENT_CLINIC_OR_DEPARTMENT_OTHER): Payer: Medicare Other

## 2011-05-16 VITALS — BP 102/65 | HR 76 | Temp 98.3°F

## 2011-05-16 DIAGNOSIS — C3490 Malignant neoplasm of unspecified part of unspecified bronchus or lung: Secondary | ICD-10-CM

## 2011-05-16 DIAGNOSIS — J329 Chronic sinusitis, unspecified: Secondary | ICD-10-CM | POA: Insufficient documentation

## 2011-05-16 DIAGNOSIS — C3410 Malignant neoplasm of upper lobe, unspecified bronchus or lung: Secondary | ICD-10-CM

## 2011-05-16 DIAGNOSIS — Z5111 Encounter for antineoplastic chemotherapy: Secondary | ICD-10-CM

## 2011-05-16 MED ORDER — ONDANSETRON 8 MG/50ML IVPB (CHCC)
8.0000 mg | Freq: Once | INTRAVENOUS | Status: AC
  Administered 2011-05-16: 8 mg via INTRAVENOUS

## 2011-05-16 MED ORDER — GADOBENATE DIMEGLUMINE 529 MG/ML IV SOLN
10.0000 mL | Freq: Once | INTRAVENOUS | Status: AC | PRN
  Administered 2011-05-16: 9 mL via INTRAVENOUS

## 2011-05-16 MED ORDER — SODIUM CHLORIDE 0.9 % IV SOLN
Freq: Once | INTRAVENOUS | Status: AC
  Administered 2011-05-16: 14:00:00 via INTRAVENOUS

## 2011-05-16 MED ORDER — SODIUM CHLORIDE 0.9 % IV SOLN
120.0000 mg/m2 | Freq: Once | INTRAVENOUS | Status: AC
  Administered 2011-05-16: 180 mg via INTRAVENOUS
  Filled 2011-05-16: qty 9

## 2011-05-16 MED ORDER — DEXAMETHASONE SODIUM PHOSPHATE 10 MG/ML IJ SOLN
10.0000 mg | Freq: Once | INTRAMUSCULAR | Status: AC
  Administered 2011-05-16: 10 mg via INTRAVENOUS

## 2011-05-16 NOTE — Telephone Encounter (Signed)
Called pt regarding appt 

## 2011-05-17 ENCOUNTER — Ambulatory Visit (HOSPITAL_BASED_OUTPATIENT_CLINIC_OR_DEPARTMENT_OTHER): Payer: Medicare Other

## 2011-05-17 VITALS — BP 113/56 | HR 86 | Temp 98.4°F

## 2011-05-17 DIAGNOSIS — C3410 Malignant neoplasm of upper lobe, unspecified bronchus or lung: Secondary | ICD-10-CM

## 2011-05-17 DIAGNOSIS — Z5111 Encounter for antineoplastic chemotherapy: Secondary | ICD-10-CM

## 2011-05-17 DIAGNOSIS — C3490 Malignant neoplasm of unspecified part of unspecified bronchus or lung: Secondary | ICD-10-CM

## 2011-05-17 MED ORDER — DEXAMETHASONE SODIUM PHOSPHATE 10 MG/ML IJ SOLN
10.0000 mg | Freq: Once | INTRAMUSCULAR | Status: AC
  Administered 2011-05-17: 10 mg via INTRAVENOUS

## 2011-05-17 MED ORDER — ONDANSETRON 8 MG/50ML IVPB (CHCC)
8.0000 mg | Freq: Once | INTRAVENOUS | Status: AC
  Administered 2011-05-17: 8 mg via INTRAVENOUS

## 2011-05-17 MED ORDER — SODIUM CHLORIDE 0.9 % IV SOLN
Freq: Once | INTRAVENOUS | Status: AC
  Administered 2011-05-17: 15:00:00 via INTRAVENOUS

## 2011-05-17 MED ORDER — SODIUM CHLORIDE 0.9 % IV SOLN
120.0000 mg/m2 | Freq: Once | INTRAVENOUS | Status: AC
  Administered 2011-05-17: 180 mg via INTRAVENOUS
  Filled 2011-05-17: qty 9

## 2011-05-17 NOTE — Patient Instructions (Signed)
Pt d/t with family; amb out of dept.  AVS reviewed.  Pt has appt for Friday. dph

## 2011-05-18 ENCOUNTER — Ambulatory Visit: Payer: Medicare Other

## 2011-05-19 ENCOUNTER — Telehealth: Payer: Self-pay | Admitting: *Deleted

## 2011-05-19 ENCOUNTER — Encounter: Payer: Self-pay | Admitting: *Deleted

## 2011-05-19 ENCOUNTER — Ambulatory Visit (HOSPITAL_BASED_OUTPATIENT_CLINIC_OR_DEPARTMENT_OTHER): Payer: Medicare Other

## 2011-05-19 VITALS — BP 134/71 | HR 87 | Temp 97.9°F

## 2011-05-19 DIAGNOSIS — C3410 Malignant neoplasm of upper lobe, unspecified bronchus or lung: Secondary | ICD-10-CM

## 2011-05-19 DIAGNOSIS — C3490 Malignant neoplasm of unspecified part of unspecified bronchus or lung: Secondary | ICD-10-CM

## 2011-05-19 MED ORDER — PEGFILGRASTIM INJECTION 6 MG/0.6ML
6.0000 mg | Freq: Once | SUBCUTANEOUS | Status: AC
  Administered 2011-05-19: 6 mg via SUBCUTANEOUS
  Filled 2011-05-19: qty 0.6

## 2011-05-19 NOTE — Telephone Encounter (Signed)
NO PROBLEMS OR QUESTIONS AT THIS TIME. PT. WILL CALL THE OFFICE IF THE NEED ARISES.

## 2011-05-22 ENCOUNTER — Encounter: Payer: Self-pay | Admitting: *Deleted

## 2011-05-22 ENCOUNTER — Telehealth: Payer: Self-pay | Admitting: Oncology

## 2011-05-22 ENCOUNTER — Other Ambulatory Visit: Payer: Medicare Other | Admitting: Lab

## 2011-05-22 NOTE — Telephone Encounter (Signed)
Approved for 100% ind 05/22/11 - 05/21/12. Family of 2, total yearly income $16,440.

## 2011-05-22 NOTE — Progress Notes (Signed)
Married  Lost 20 lbs past 7 mos, decreased appetite, SOB w/light exertion

## 2011-05-23 ENCOUNTER — Ambulatory Visit (HOSPITAL_BASED_OUTPATIENT_CLINIC_OR_DEPARTMENT_OTHER): Payer: Medicare Other | Admitting: Internal Medicine

## 2011-05-23 ENCOUNTER — Encounter: Payer: Self-pay | Admitting: Radiation Oncology

## 2011-05-23 ENCOUNTER — Telehealth: Payer: Self-pay | Admitting: *Deleted

## 2011-05-23 ENCOUNTER — Ambulatory Visit
Admission: RE | Admit: 2011-05-23 | Discharge: 2011-05-23 | Disposition: A | Discharge status: Home / Self Care | Payer: Medicare Other | Source: Ambulatory Visit | Attending: Radiation Oncology | Admitting: Radiation Oncology

## 2011-05-23 ENCOUNTER — Other Ambulatory Visit (HOSPITAL_BASED_OUTPATIENT_CLINIC_OR_DEPARTMENT_OTHER): Payer: Medicare Other

## 2011-05-23 VITALS — BP 104/70 | HR 103 | Temp 98.0°F | Ht 64.0 in | Wt 107.4 lb

## 2011-05-23 DIAGNOSIS — R1310 Dysphagia, unspecified: Secondary | ICD-10-CM | POA: Insufficient documentation

## 2011-05-23 DIAGNOSIS — Z8049 Family history of malignant neoplasm of other genital organs: Secondary | ICD-10-CM | POA: Insufficient documentation

## 2011-05-23 DIAGNOSIS — K1370 Unspecified lesions of oral mucosa: Secondary | ICD-10-CM | POA: Insufficient documentation

## 2011-05-23 DIAGNOSIS — Z87891 Personal history of nicotine dependence: Secondary | ICD-10-CM | POA: Insufficient documentation

## 2011-05-23 DIAGNOSIS — L589 Radiodermatitis, unspecified: Secondary | ICD-10-CM | POA: Insufficient documentation

## 2011-05-23 DIAGNOSIS — K219 Gastro-esophageal reflux disease without esophagitis: Secondary | ICD-10-CM | POA: Insufficient documentation

## 2011-05-23 DIAGNOSIS — J4489 Other specified chronic obstructive pulmonary disease: Secondary | ICD-10-CM | POA: Insufficient documentation

## 2011-05-23 DIAGNOSIS — C3410 Malignant neoplasm of upper lobe, unspecified bronchus or lung: Secondary | ICD-10-CM

## 2011-05-23 DIAGNOSIS — C3490 Malignant neoplasm of unspecified part of unspecified bronchus or lung: Secondary | ICD-10-CM

## 2011-05-23 DIAGNOSIS — Z800 Family history of malignant neoplasm of digestive organs: Secondary | ICD-10-CM | POA: Insufficient documentation

## 2011-05-23 DIAGNOSIS — Y842 Radiological procedure and radiotherapy as the cause of abnormal reaction of the patient, or of later complication, without mention of misadventure at the time of the procedure: Secondary | ICD-10-CM | POA: Insufficient documentation

## 2011-05-23 DIAGNOSIS — I10 Essential (primary) hypertension: Secondary | ICD-10-CM | POA: Insufficient documentation

## 2011-05-23 DIAGNOSIS — E039 Hypothyroidism, unspecified: Secondary | ICD-10-CM | POA: Insufficient documentation

## 2011-05-23 DIAGNOSIS — R599 Enlarged lymph nodes, unspecified: Secondary | ICD-10-CM | POA: Insufficient documentation

## 2011-05-23 DIAGNOSIS — L988 Other specified disorders of the skin and subcutaneous tissue: Secondary | ICD-10-CM | POA: Insufficient documentation

## 2011-05-23 DIAGNOSIS — J449 Chronic obstructive pulmonary disease, unspecified: Secondary | ICD-10-CM | POA: Insufficient documentation

## 2011-05-23 DIAGNOSIS — Z510 Encounter for antineoplastic radiation therapy: Secondary | ICD-10-CM | POA: Insufficient documentation

## 2011-05-23 HISTORY — DX: Depression, unspecified: F32.A

## 2011-05-23 HISTORY — DX: Major depressive disorder, single episode, unspecified: F32.9

## 2011-05-23 LAB — COMPREHENSIVE METABOLIC PANEL
Albumin: 3.8 g/dL (ref 3.5–5.2)
CO2: 25 mEq/L (ref 19–32)
Calcium: 9.4 mg/dL (ref 8.4–10.5)
Chloride: 101 mEq/L (ref 96–112)
Glucose, Bld: 99 mg/dL (ref 70–99)
Potassium: 3.6 mEq/L (ref 3.5–5.3)
Sodium: 137 mEq/L (ref 135–145)
Total Protein: 6.8 g/dL (ref 6.0–8.3)

## 2011-05-23 LAB — CBC WITH DIFFERENTIAL/PLATELET
Basophils Absolute: 0 10*3/uL (ref 0.0–0.1)
Eosinophils Absolute: 0.1 10*3/uL (ref 0.0–0.5)
HGB: 11.4 g/dL — ABNORMAL LOW (ref 11.6–15.9)
MONO#: 0.3 10*3/uL (ref 0.1–0.9)
NEUT#: 1.5 10*3/uL (ref 1.5–6.5)
Platelets: 223 10*3/uL (ref 145–400)
RBC: 3.77 10*6/uL (ref 3.70–5.45)
RDW: 12.6 % (ref 11.2–14.5)
WBC: 3.2 10*3/uL — ABNORMAL LOW (ref 3.9–10.3)

## 2011-05-23 NOTE — Progress Notes (Signed)
[facility] OFFICE PROGRESS NOTE  Garlan Fillers, MD, MD [address] [facility], [place]. 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    PRIOR THERAPY: None  CURRENT THERAPY: Systemic chemotherapy with carboplatin for AUC of 5 on day 1 and etoposide 120 mg/M2 on days 1,2 and 3 with Neulasta support on day 4, status post 1 cycle   INTERVAL HISTORY: Robbye Dede Amadon 65 y.o. female returns to the clinic today for followup visit accompanied by a friend. The patient is feeling fine and tolerated the first cycle of her systemic chemotherapy fairly well. She denied having any significant nausea or vomiting, no chest pain or shortness breath, no cough or hemoptysis,no weight loss or night sweats. She was seen by Dr. Dayton Scrape earlier today for consideration of concurrent radiotherapy. The patient is here today for evaluation and management any adverse effects of her treatment.  MEDICAL HISTORY: Past Medical History  Diagnosis Date  . Hypertension   . Hypothyroid   . GERD (gastroesophageal reflux disease)   . COPD (chronic obstructive pulmonary disease)   . Low back pain   . Lung cancer 05/09/2011    RUL  . Depression     ALLERGIES:  is allergic to codeine.  MEDICATIONS:  Current Outpatient Prescriptions  Medication Sig Dispense Refill  . acetaminophen (TYLENOL) 500 MG tablet Take 500 mg by mouth every 6 (six) hours as needed.        Marland Kitchen albuterol (PROVENTIL) (2.5 MG/3ML) 0.083% nebulizer solution Take 2.5 mg by nebulization every 4 (four) hours as needed.        Marland Kitchen albuterol (VENTOLIN HFA) 108 (90 BASE) MCG/ACT inhaler Inhale 2 puffs into the lungs every 6 (six) hours as needed.        . benazepril-hydrochlorthiazide (LOTENSIN HCT) 10-12.5 MG per tablet Take 1 tablet by mouth daily as needed.       . citalopram (CELEXA) 10 MG tablet Take 10 mg by mouth daily.        Marland Kitchen docusate sodium (COLACE)  100 MG capsule Take 100 mg by mouth 2 (two) times daily. 2 po bid       . Fluticasone-Salmeterol (ADVAIR) 250-50 MCG/DOSE AEPB Inhale 1 puff into the lungs 2 (two) times daily.        Marland Kitchen levothyroxine (SYNTHROID, LEVOTHROID) 112 MCG tablet Take 112 mcg by mouth daily.        Marland Kitchen loratadine (CLARITIN) 10 MG tablet Take 10 mg by mouth daily. prn       . methylPREDNISolone (MEDROL DOSEPAK) 4 MG tablet Take by mouth as directed. follow package directions       . Multiple Vitamin (MULTIVITAMIN) tablet Take 1 tablet by mouth daily.        Marland Kitchen omeprazole (PRILOSEC OTC) 20 MG tablet Take 20 mg by mouth daily.       . prochlorperazine (COMPAZINE) 10 MG tablet Take 10 mg by mouth every 6 (six) hours as needed.        . roflumilast (DALIRESP) 500 MCG TABS tablet Take 500 mcg by mouth daily.          SURGICAL HISTORY:  Past Surgical History  Procedure Date  . Tubal ligation 1984  . Total abdominal hysterectomy 1986  . Tonsillectomy     REVIEW OF SYSTEMS:  A comprehensive review of systems was negative.   PHYSICAL EXAMINATION: General appearance: alert, cooperative and no distress Head: Normocephalic, without obvious  abnormality, atraumatic Neck: no adenopathy Lymph nodes: Cervical, supraclavicular, and axillary nodes normal. Resp: clear to auscultation bilaterally Cardio: regular rate and rhythm, S1, S2 normal, no murmur, click, rub or gallop GI: soft, non-tender; bowel sounds normal; no masses,  no organomegaly Extremities: extremities normal, atraumatic, no cyanosis or edema Neurologic: Alert and oriented X 3, normal strength and tone. Normal symmetric reflexes. Normal coordination and gait  ECOG PERFORMANCE STATUS: 1 - Symptomatic but completely ambulatory  Blood pressure 115/64, pulse 90, temperature 97.5 F (36.4 C), temperature source Oral, height 5\' 4"  (1.626 m), weight 108 lb 1.6 oz (49.034 kg).  LABORATORY DATA: Lab Results  Component Value Date   WBC 3.2* 05/23/2011   HGB 11.4*  05/23/2011   HCT 33.7* 05/23/2011   MCV 89.4 05/23/2011   PLT 223 05/23/2011      Chemistry      Component Value Date/Time   NA 139 05/15/2011 1407   K 3.3* 05/15/2011 1407   CL 104 05/15/2011 1407   CO2 25 05/15/2011 1407   BUN 14 05/15/2011 1407   CREATININE 0.77 05/15/2011 1407      Component Value Date/Time   CALCIUM 9.6 05/15/2011 1407   ALKPHOS 69 08/12/2010 2115   AST 15 08/12/2010 2115   ALT 18 08/12/2010 2115   BILITOT 0.3 08/12/2010 2115       RADIOGRAPHIC STUDIES: Dg Eye Foreign Body  05/16/2011  *RADIOLOGY REPORT*  Clinical Data:  History of sewing needle foreign body injury to lie.  The patient needs clearance prior to MRI  ORBITS FOR FOREIGN BODY - 2 VIEW  Comparison:  None.  Findings:  There is no evidence of metallic foreign body within the orbits.  No significant bone abnormality identified.  IMPRESSION: No evidence of metallic foreign body within the orbits.  Original Report Authenticated By: Reola Calkins, M.D.   Mr Laqueta Jean Wo Contrast  05/16/2011  *RADIOLOGY REPORT*  Clinical Data: Lung cancer restaging.  Rule out metastatic disease  MRI HEAD WITHOUT AND WITH CONTRAST  Technique:  Multiplanar, multiecho pulse sequences of the brain and surrounding structures were obtained according to standard protocol without and with intravenous contrast  Contrast: 9mL MULTIHANCE GADOBENATE DIMEGLUMINE 529 MG/ML IV SOLN  Comparison: None.  Findings: Negative for metastatic disease to the brain.  No enhancing lesions are identified.  Patchy nonenhancing white matter hyperintensities bilaterally, typical for chronic microvascular ischemia.  Negative for acute infarct.  Negative for hydrocephalus.  No hemorrhage is identified.  Mild chronic sinusitis.  IMPRESSION: Negative for metastatic disease to the brain.  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  Original Report Authenticated By: Camelia Phenes, M.D.   ASSESSMENT: This is a very pleasant 65 years old white  female recently diagnosed with limited stage small cell lung cancer, currently on systemic chemotherapy with carboplatin and etoposide status post 1 cycle given last week. The patient is doing fine and she has no significant complaints.  PLAN: I given patient recommendation regarding neutropenic precautions which is expected to happen next few days. She was advised to call immediately she has any fever of 100.5. She would come back for followup visit in 2 weeks for reevaluation and the start of cycle #2. The patient is also expected to start concurrent radiotherapy in the next 2 weeks.  All questions were answered. The patient knows to call the clinic with any problems, questions or concerns. We can certainly see the patient much sooner if necessary.

## 2011-05-23 NOTE — Progress Notes (Signed)
Encounter addended by: Amanda Pea, RN on: 05/23/2011  4:22 PM<BR>     Documentation filed: Visit Diagnoses

## 2011-05-23 NOTE — Progress Notes (Signed)
Encounter addended by: Tessa Lerner, RN on: 05/23/2011  3:13 PM<BR>     Documentation filed: Charges VN

## 2011-05-23 NOTE — Progress Notes (Signed)
Encounter addended by: Delynn Flavin, RN on: 05/23/2011  5:41 PM<BR>     Documentation filed: Inpatient Patient Education

## 2011-05-23 NOTE — Progress Notes (Signed)
Has completed 1 session of chemotherapy which she has tolerated well. 2nd round chemo to start 06/05/11. Scheduled for lab and Dr.Mohamed this afternoon.

## 2011-05-23 NOTE — Telephone Encounter (Signed)
gave patient appointment for 06-06-2011

## 2011-05-23 NOTE — Progress Notes (Signed)
Please see the Nurse Progress Note in the MD Initial Consult Encounter for this patient. 

## 2011-05-23 NOTE — Progress Notes (Signed)
[facility] Radiation Oncology NEW PATIENT EVALUATION  Name: Janice Daniels MRN: [id number]  Date: 05/23/2011  DOB: 02/03/1946  Status:outpatient    WU:[id number],WGNFAO G, MD, MD  Lajuana Matte., MD    REFERRING PHYSICIAN: Lajuana Matte., MD   DIAGNOSIS: Lung cancer   Primary site: Lung (Right)   Staging method: AJCC 7th Edition   Clinical: Stage IIIA (T2b, N2, M0) signed by Velora Heckler. Arbutus Ped, MD on 05/09/2011 11:49 AM   Summary: Stage IIIA (T2b, N2, M0)    HISTORY OF PRESENT ILLNESS::Janice Daniels is a 65 y.o. female who is seen today for the courtesy of Dr. Arbutus Ped for consideration of radiation therapy in the management of her stage IIIA small cell carcinoma of the right lung. She had a syncopal episode after taking magnesium citrate for constipation. She was evaluated by Dr. Ivery Quale. He obtained a chest x-ray on 03/22/2011 which showed a 2.9 cm focal right upper lobe opacity. A CT scan of the chest the same day showed a 5.3 x 3.6 x 1.9 cm right upper lobe mass suspicious for carcinoma. There was also associated right hilar lymphadenopathy. Scans of the abdomen were without evidence for metastatic disease. A PET scan on 04/20/2011 again showed a right upper lobe mass with right infrahilar adenopathy. There were also 2 small upper lobe lesions, weakly positive. No evidence for metastatic disease within the neck, abdomen, or pelvis. She underwent bronchoscopy with Dr. Shelle Iron issues noted to have an obstructing type process in a subsegment of the posterior segment of the right upper lobe. Biopsies were positive for small cell carcinoma. Her staging workup also included a MRI scan of the brain which was without evidence for metastatic disease. Marland Kitchen She was seen in consultation by Dr. Arbutus Ped who began chemotherapy last week. Of note is that she has had a 20 pound weight loss over the past 6 months. She presented with a cough which is improved after  initiation of chemotherapy. Her appetite is improved and she believes that she is gaining 3-4 pounds. She canceled her pulmonary function tests with Dr. Shelle Iron but these are to be rescheduled this week. Her next chemotherapy is scheduled for December 10.   PREVIOUS RADIATION THERAPY: No   PAST MEDICAL HISTORY:  has a past medical history of Hypertension; Hypothyroid; GERD (gastroesophageal reflux disease); COPD (chronic obstructive pulmonary disease); Low back pain; Lung cancer (05/09/2011); and Depression.     PAST SURGICAL HISTORY: Past Surgical History  Procedure Date  . Tubal ligation 1984  . Total abdominal hysterectomy 1986  . Tonsillectomy      ETIOLOGIC FACTORS: 50 pack-yr cigarette smoking history   FAMILY HISTORY: family history includes Cancer in her brother, father, and sister; Cervical cancer in her sister; Diabetes in her sisters; Esophageal cancer in her brother and father; Heart disease in her mother; Hyperlipidemia in her mother; Hypertension in her mother; Hypothyroidism in her father; and Other in her brothers.   SOCIAL HISTORY:  reports that she quit smoking about 3 weeks ago. Her smoking use included Cigarettes. She has a 50 pack-year smoking history. She does not have any smokeless tobacco history on file. She reports that she does not drink alcohol or use illicit drugs.   ALLERGIES: Codeine   MEDICATIONS:Current outpatient prescriptions:acetaminophen (TYLENOL) 500 MG tablet, Take 500 mg by mouth every 6 (six) hours as needed.  , Disp: , Rfl: ;  albuterol (PROVENTIL) (2.5 MG/3ML) 0.083% nebulizer solution, Take 2.5 mg by nebulization every 4 (four)  hours as needed.  , Disp: , Rfl: ;  albuterol (VENTOLIN HFA) 108 (90 BASE) MCG/ACT inhaler, Inhale 2 puffs into the lungs every 6 (six) hours as needed.  , Disp: , Rfl:  benazepril-hydrochlorthiazide (LOTENSIN HCT) 10-12.5 MG per tablet, Take 1 tablet by mouth daily as needed. , Disp: , Rfl: ;  citalopram (CELEXA) 10 MG  tablet, Take 10 mg by mouth daily.  , Disp: , Rfl: ;  docusate sodium (COLACE) 100 MG capsule, Take 100 mg by mouth 2 (two) times daily. 2 po bid , Disp: , Rfl: ;  Fluticasone-Salmeterol (ADVAIR) 250-50 MCG/DOSE AEPB, Inhale 1 puff into the lungs 2 (two) times daily.  , Disp: , Rfl:  levothyroxine (SYNTHROID, LEVOTHROID) 112 MCG tablet, Take 112 mcg by mouth daily.  , Disp: , Rfl: ;  loratadine (CLARITIN) 10 MG tablet, Take 10 mg by mouth daily. prn , Disp: , Rfl: ;  Multiple Vitamin (MULTIVITAMIN) tablet, Take 1 tablet by mouth daily.  , Disp: , Rfl: ;  omeprazole (PRILOSEC OTC) 20 MG tablet, Take 20 mg by mouth daily. , Disp: , Rfl:  prochlorperazine (COMPAZINE) 10 MG tablet, Take 10 mg by mouth every 6 (six) hours as needed.  , Disp: , Rfl: ;  roflumilast (DALIRESP) 500 MCG TABS tablet, Take 500 mcg by mouth daily.  , Disp: , Rfl: ;  methylPREDNISolone (MEDROL DOSEPAK) 4 MG tablet, Take by mouth as directed. follow package directions , Disp: , Rfl:    REVIEW OF SYSTEMS:  Pertinent items are noted in HPI.    PHYSICAL EXAM:  height is 5\' 4"  (1.626 m) and weight is 107 lb 6.4 oz (48.716 kg). Her temperature is 98 F (36.7 C). Her blood pressure is 104/70 and her pulse is 103. Her oxygen saturation is 96%.  Head and neck examination: Grossly unremarkable. Chest distant breath sounds, otherwise clear. Heart regular rate and rhythm. Back without spinal or CVA tenderness. Abdomen soft without masses or organomegaly. Extremities without edema. Neurologic examination grossly nonfocal.   LABORATORY DATA:  Lab Results  Component Value Date   WBC 12.6* 03/22/2011   HGB 12.9 03/22/2011   HCT 37.2 03/22/2011   MCV 89.2 03/22/2011   PLT 369 03/22/2011        IMPRESSION: Stage III A limited stage small cell carcinoma of the right lung. I explained to the patient and her sister that chemotherapy radiation is the treatment of choice. We discussed the potential acute and late toxicities of radiation therapy and  she wishes to proceed. I requested that she contact Dr. Teddy Spike office to reschedule her pulmonary function tests. She understands that there is less than a 20% chance for cure. We also briefly discussed prophylactic cranial irradiation should she have a complete response. Consent was signed today. I will get her scheduled for treatment planning later this week.   PLAN: As above. She will undergo simulation/treatment planning later this week. She will start her radiation therapy no later than December 10.   I spent 60 minutes minutes face to face with the patient and more than 50% of that time was spent in counseling and/or coordination of care.

## 2011-05-25 ENCOUNTER — Ambulatory Visit (INDEPENDENT_AMBULATORY_CARE_PROVIDER_SITE_OTHER): Payer: Medicare Other | Admitting: Internal Medicine

## 2011-05-25 ENCOUNTER — Encounter: Payer: Self-pay | Admitting: Pulmonary Disease

## 2011-05-25 ENCOUNTER — Ambulatory Visit
Admission: RE | Admit: 2011-05-25 | Discharge: 2011-05-25 | Disposition: A | Discharge status: Home / Self Care | Payer: Medicare Other | Source: Ambulatory Visit | Attending: Radiation Oncology | Admitting: Radiation Oncology

## 2011-05-25 DIAGNOSIS — J449 Chronic obstructive pulmonary disease, unspecified: Secondary | ICD-10-CM

## 2011-05-25 DIAGNOSIS — C3490 Malignant neoplasm of unspecified part of unspecified bronchus or lung: Secondary | ICD-10-CM

## 2011-05-25 LAB — PULMONARY FUNCTION TEST

## 2011-05-25 NOTE — Progress Notes (Signed)
PFT done today. 

## 2011-05-25 NOTE — Progress Notes (Signed)
Simulation/Treatment Planning Note:  The patient underwent simulation/treatment planning in the management of her limited stage small cell carcinoma of the right lung. She was taken to the CT simulator and placed supine with her arms extended. She was then scanned. I chose an arbitrary isocenter along her right hilum. Her planning images were then fused with her PET scan. I contoured her GTV (gross tumor). I then contoured her CTV an expanded this by 1.0 cm to create PTV 6000. I prescribing 6000 cGy in 30 sessions utilizing 10 MV photons. She is now ready for 3-D simulation. I requesting daily KV imaging sitting up to bone. I am also requesting a weekly cone beam CT scan to assess her primary tumor.

## 2011-05-29 ENCOUNTER — Other Ambulatory Visit (HOSPITAL_BASED_OUTPATIENT_CLINIC_OR_DEPARTMENT_OTHER): Payer: Medicare Other | Admitting: Lab

## 2011-05-29 ENCOUNTER — Encounter: Payer: Self-pay | Admitting: Radiation Oncology

## 2011-05-29 DIAGNOSIS — C3490 Malignant neoplasm of unspecified part of unspecified bronchus or lung: Secondary | ICD-10-CM

## 2011-05-29 LAB — CBC WITH DIFFERENTIAL/PLATELET
Eosinophils Absolute: 0 10*3/uL (ref 0.0–0.5)
LYMPH%: 7.9 % — ABNORMAL LOW (ref 14.0–49.7)
MONO#: 0.7 10*3/uL (ref 0.1–0.9)
NEUT#: 13.1 10*3/uL — ABNORMAL HIGH (ref 1.5–6.5)
Platelets: 147 10*3/uL (ref 145–400)
RBC: 3.58 10*6/uL — ABNORMAL LOW (ref 3.70–5.45)
WBC: 15 10*3/uL — ABNORMAL HIGH (ref 3.9–10.3)

## 2011-05-29 LAB — COMPREHENSIVE METABOLIC PANEL
BUN: 11 mg/dL (ref 6–23)
CO2: 25 mEq/L (ref 19–32)
Creatinine, Ser: 0.78 mg/dL (ref 0.50–1.10)
Glucose, Bld: 115 mg/dL — ABNORMAL HIGH (ref 70–99)
Sodium: 138 mEq/L (ref 135–145)
Total Bilirubin: 0.1 mg/dL — ABNORMAL LOW (ref 0.3–1.2)
Total Protein: 6.6 g/dL (ref 6.0–8.3)

## 2011-05-29 NOTE — Progress Notes (Signed)
3 D Simulation Note:  The patient underwent 3-D simulation today in the management of her small cell carcinoma of the right lung. dose volume histograms were obtained for the target and normal shredding structures including the lung, and spinal cord. We met or target and avoidance goals. 3 separate multileaf collimators are designed to conform the field. I prescribing 6000 cGy in 30 sessions utilizing 10 MV photons. I requesting daily KV imaging any weekly, beam CT scan.

## 2011-05-29 NOTE — Progress Notes (Signed)
Quick Note:  Call patient with the result And order K Dur 20 meq po qd X 7 ______

## 2011-05-30 ENCOUNTER — Other Ambulatory Visit: Payer: Self-pay | Admitting: *Deleted

## 2011-05-30 DIAGNOSIS — C3490 Malignant neoplasm of unspecified part of unspecified bronchus or lung: Secondary | ICD-10-CM

## 2011-05-30 MED ORDER — POTASSIUM CHLORIDE CRYS ER 20 MEQ PO TBCR: 20.0000 meq | EXTENDED_RELEASE_TABLET | Freq: Every day | ORAL | Status: DC

## 2011-05-30 NOTE — Telephone Encounter (Signed)
K 3.2, per Dr Donnald Garre, pt needs x 7 days, pt verbalized understanding.  SLJ

## 2011-05-31 LAB — FUNGUS CULTURE W SMEAR

## 2011-06-01 ENCOUNTER — Ambulatory Visit
Admission: RE | Admit: 2011-06-01 | Discharge: 2011-06-01 | Disposition: A | Discharge status: Home / Self Care | Payer: Medicare Other | Source: Ambulatory Visit | Attending: Radiation Oncology | Admitting: Radiation Oncology

## 2011-06-01 NOTE — Procedures (Signed)
NARRATIVE:  Ms. Linebaugh underwent simulation verification for treatment to her right lung.  Her isocenter is in good position, and the multileaf collimators contoured the treatment volume appropriately.    ______________________________ Maryln Gottron, M.D. RJM/MEDQ  D:  06/01/2011  T:  06/01/2011  Job:  [id number]

## 2011-06-02 ENCOUNTER — Ambulatory Visit: Payer: Medicare Other | Admitting: Pulmonary Disease

## 2011-06-02 ENCOUNTER — Telehealth: Payer: Self-pay | Admitting: Internal Medicine

## 2011-06-02 NOTE — Telephone Encounter (Addendum)
Message copied by Charma Igo on Fri Jun 02, 2011 10:03 AM ------      Message from: Si Gaul      Created: Mon May 29, 2011  5:07 PM       Call patient with the result  And order K Dur 20 meq po qd X 7 06/02/11-called pt and she already has K dur and has 3 doses left.

## 2011-06-03 ENCOUNTER — Other Ambulatory Visit: Payer: Self-pay | Admitting: Internal Medicine

## 2011-06-03 DIAGNOSIS — E876 Hypokalemia: Secondary | ICD-10-CM

## 2011-06-05 ENCOUNTER — Ambulatory Visit (HOSPITAL_BASED_OUTPATIENT_CLINIC_OR_DEPARTMENT_OTHER): Payer: Medicare Other

## 2011-06-05 ENCOUNTER — Ambulatory Visit
Admission: RE | Admit: 2011-06-05 | Discharge: 2011-06-05 | Disposition: A | Discharge status: Home / Self Care | Payer: Medicare Other | Source: Ambulatory Visit | Attending: Radiation Oncology | Admitting: Radiation Oncology

## 2011-06-05 ENCOUNTER — Other Ambulatory Visit (HOSPITAL_BASED_OUTPATIENT_CLINIC_OR_DEPARTMENT_OTHER): Payer: Medicare Other | Admitting: Lab

## 2011-06-05 ENCOUNTER — Other Ambulatory Visit: Payer: Self-pay | Admitting: Internal Medicine

## 2011-06-05 ENCOUNTER — Other Ambulatory Visit: Payer: Self-pay | Admitting: *Deleted

## 2011-06-05 VITALS — BP 128/59 | HR 90 | Temp 97.0°F

## 2011-06-05 DIAGNOSIS — Z5111 Encounter for antineoplastic chemotherapy: Secondary | ICD-10-CM

## 2011-06-05 DIAGNOSIS — C3490 Malignant neoplasm of unspecified part of unspecified bronchus or lung: Secondary | ICD-10-CM

## 2011-06-05 DIAGNOSIS — C3410 Malignant neoplasm of upper lobe, unspecified bronchus or lung: Secondary | ICD-10-CM

## 2011-06-05 LAB — COMPREHENSIVE METABOLIC PANEL
BUN: 16 mg/dL (ref 6–23)
CO2: 26 mEq/L (ref 19–32)
Calcium: 8.8 mg/dL (ref 8.4–10.5)
Chloride: 103 mEq/L (ref 96–112)
Creatinine, Ser: 0.74 mg/dL (ref 0.50–1.10)
Glucose, Bld: 93 mg/dL (ref 70–99)

## 2011-06-05 LAB — CBC WITH DIFFERENTIAL/PLATELET
Basophils Absolute: 0.1 10*3/uL (ref 0.0–0.1)
Eosinophils Absolute: 0.1 10*3/uL (ref 0.0–0.5)
HCT: 34.2 % — ABNORMAL LOW (ref 34.8–46.6)
LYMPH%: 13.8 % — ABNORMAL LOW (ref 14.0–49.7)
MCHC: 33.9 g/dL (ref 31.5–36.0)
MONO#: 1.6 10*3/uL — ABNORMAL HIGH (ref 0.1–0.9)
NEUT#: 7.7 10*3/uL — ABNORMAL HIGH (ref 1.5–6.5)
NEUT%: 70.1 % (ref 38.4–76.8)
Platelets: 418 10*3/uL — ABNORMAL HIGH (ref 145–400)
WBC: 11 10*3/uL — ABNORMAL HIGH (ref 3.9–10.3)

## 2011-06-05 MED ORDER — ONDANSETRON 16 MG/50ML IVPB (CHCC)
16.0000 mg | Freq: Once | INTRAVENOUS | Status: AC
  Administered 2011-06-05: 16 mg via INTRAVENOUS

## 2011-06-05 MED ORDER — SODIUM CHLORIDE 0.9 % IV SOLN
Freq: Once | INTRAVENOUS | Status: AC
  Administered 2011-06-05: 10:00:00 via INTRAVENOUS

## 2011-06-05 MED ORDER — SODIUM CHLORIDE 0.9 % IV SOLN
394.0000 mg | Freq: Once | INTRAVENOUS | Status: AC
  Administered 2011-06-05: 390 mg via INTRAVENOUS
  Filled 2011-06-05: qty 39

## 2011-06-05 MED ORDER — SODIUM CHLORIDE 0.9 % IV SOLN
120.0000 mg/m2 | Freq: Once | INTRAVENOUS | Status: AC
  Administered 2011-06-05: 180 mg via INTRAVENOUS
  Filled 2011-06-05: qty 9

## 2011-06-05 MED ORDER — DEXAMETHASONE SODIUM PHOSPHATE 4 MG/ML IJ SOLN
20.0000 mg | Freq: Once | INTRAMUSCULAR | Status: AC
  Administered 2011-06-05: 20 mg via INTRAVENOUS

## 2011-06-05 NOTE — Progress Notes (Addendum)
Weekly Management Note:  Site:R Lung Current Dose:  200  cGy Projected Dose: 6000  cGy  Narrative: The patient is seen today for routine under treatment assessment. CBCT/MVCT images/port films were reviewed. The chart was reviewed.   No complaints today. Specifically, no respiratory issues.CBC from 12-10 (today) satisfactory.  Physical Examination: There were no vitals filed for this visit..  Weight:  . No change there  Impression: Tolerating radiation therapy well.  Plan: Continue radiation therapy as planned.

## 2011-06-05 NOTE — Progress Notes (Signed)
Post sim done, all questions answered. Pt given "Radiation and  You" booklet.

## 2011-06-05 NOTE — Progress Notes (Signed)
Encounter addended by: Maryln Gottron, MD on: 06/05/2011  4:48 PM<BR>     Documentation filed: Notes Section

## 2011-06-06 ENCOUNTER — Ambulatory Visit (HOSPITAL_BASED_OUTPATIENT_CLINIC_OR_DEPARTMENT_OTHER): Payer: Medicare Other

## 2011-06-06 ENCOUNTER — Ambulatory Visit
Admission: RE | Admit: 2011-06-06 | Discharge: 2011-06-06 | Disposition: A | Discharge status: Home / Self Care | Payer: Medicare Other | Source: Ambulatory Visit | Attending: Radiation Oncology | Admitting: Radiation Oncology

## 2011-06-06 ENCOUNTER — Ambulatory Visit (HOSPITAL_BASED_OUTPATIENT_CLINIC_OR_DEPARTMENT_OTHER): Payer: Medicare Other | Admitting: Internal Medicine

## 2011-06-06 VITALS — HR 67 | Temp 97.0°F | Wt 113.4 lb

## 2011-06-06 VITALS — BP 110/66 | HR 84 | Temp 98.2°F

## 2011-06-06 DIAGNOSIS — C3490 Malignant neoplasm of unspecified part of unspecified bronchus or lung: Secondary | ICD-10-CM

## 2011-06-06 DIAGNOSIS — C3410 Malignant neoplasm of upper lobe, unspecified bronchus or lung: Secondary | ICD-10-CM

## 2011-06-06 DIAGNOSIS — Z5111 Encounter for antineoplastic chemotherapy: Secondary | ICD-10-CM

## 2011-06-06 MED ORDER — SODIUM CHLORIDE 0.9 % IV SOLN
120.0000 mg/m2 | Freq: Once | INTRAVENOUS | Status: AC
  Administered 2011-06-06: 180 mg via INTRAVENOUS
  Filled 2011-06-06: qty 9

## 2011-06-06 MED ORDER — SODIUM CHLORIDE 0.9 % IJ SOLN
3.0000 mL | INTRAMUSCULAR | Status: DC | PRN
  Filled 2011-06-06: qty 10

## 2011-06-06 MED ORDER — SODIUM CHLORIDE 0.9 % IV SOLN
Freq: Once | INTRAVENOUS | Status: AC
  Administered 2011-06-06: 14:00:00 via INTRAVENOUS

## 2011-06-06 MED ORDER — DEXAMETHASONE SODIUM PHOSPHATE 10 MG/ML IJ SOLN
10.0000 mg | Freq: Once | INTRAMUSCULAR | Status: AC
  Administered 2011-06-06: 10 mg via INTRAVENOUS

## 2011-06-06 MED ORDER — ONDANSETRON 8 MG/50ML IVPB (CHCC)
8.0000 mg | Freq: Once | INTRAVENOUS | Status: AC
  Administered 2011-06-06: 8 mg via INTRAVENOUS

## 2011-06-06 NOTE — Progress Notes (Signed)
[facility] OFFICE PROGRESS NOTE  Garlan Fillers, MD, MD [address] [facility], [place]. 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   PRIOR THERAPY: None.   CURRENT THERAPY: Systemic chemotherapy with carboplatin for AUC of 5 on day 1 and etoposide 120 mg/M2 on days 1,2 and 3 with Neulasta support on day 4, status post 1 cycle no concurrent with radiotherapy.   INTERVAL HISTORY: Maryalice Pasley Stansel 65 y.o. female returns to the clinic today for followup visit accompanied by her niece. The patient is doing fine today and she tolerated the first cycle of her chemotherapy fairly well. She denied having any significant nausea or vomiting, no fever or chills, no chest pain or shortness breath, no cough or hemoptysis, no significant weight loss or night sweats. She started concurrent radiotherapy under the care of Dr. Dayton Scrape. The patient was started cycle #2 of her chemotherapy yesterday.  MEDICAL HISTORY: Past Medical History  Diagnosis Date  . Hypertension   . Hypothyroid   . GERD (gastroesophageal reflux disease)   . COPD (chronic obstructive pulmonary disease)   . Low back pain   . Lung cancer 05/09/2011    RUL  . Depression     ALLERGIES:  is allergic to codeine.  MEDICATIONS:  Current Outpatient Prescriptions  Medication Sig Dispense Refill  . acetaminophen (TYLENOL) 500 MG tablet Take 500 mg by mouth every 6 (six) hours as needed.        Marland Kitchen albuterol (PROVENTIL) (2.5 MG/3ML) 0.083% nebulizer solution Take 2.5 mg by nebulization every 4 (four) hours as needed.        Marland Kitchen albuterol (VENTOLIN HFA) 108 (90 BASE) MCG/ACT inhaler Inhale 2 puffs into the lungs every 6 (six) hours as needed.        . benazepril-hydrochlorthiazide (LOTENSIN HCT) 10-12.5 MG per tablet Take 1 tablet by mouth daily as needed.       . citalopram (CELEXA) 10 MG tablet Take 10 mg by mouth daily.        Marland Kitchen docusate  sodium (COLACE) 100 MG capsule Take 100 mg by mouth 2 (two) times daily. 2 po bid       . Fluticasone-Salmeterol (ADVAIR) 250-50 MCG/DOSE AEPB Inhale 1 puff into the lungs 2 (two) times daily.        Marland Kitchen levothyroxine (SYNTHROID, LEVOTHROID) 112 MCG tablet Take 112 mcg by mouth daily.        Marland Kitchen loratadine (CLARITIN) 10 MG tablet Take 10 mg by mouth daily. prn       . Multiple Vitamin (MULTIVITAMIN) tablet Take 1 tablet by mouth daily.        Marland Kitchen omeprazole (PRILOSEC OTC) 20 MG tablet Take 20 mg by mouth daily.       . prochlorperazine (COMPAZINE) 10 MG tablet Take 10 mg by mouth every 6 (six) hours as needed.        . roflumilast (DALIRESP) 500 MCG TABS tablet Take 500 mcg by mouth daily.        . methylPREDNISolone (MEDROL DOSEPAK) 4 MG tablet Take by mouth as directed. follow package directions       . potassium chloride SA (K-DUR,KLOR-CON) 20 MEQ tablet TAKE 1 DAILY FOR 7 DAYS  7 tablet  0   No current facility-administered medications for this visit.   Facility-Administered Medications Ordered in Other Visits  Medication Dose Route Frequency Provider Last Rate Last Dose  . 0.9 %  sodium  chloride infusion   Intravenous Once Czar Ysaguirre K. Kaitlin Ardito, MD      . etoposide (VEPESID) 180 mg in sodium chloride 0.9 % 500 mL chemo infusion  120 mg/m2 (Treatment Plan Actual) Intravenous Once Lastacia Solum K. Arbutus Ped, MD   180 mg at 06/05/11 1204    SURGICAL HISTORY:  Past Surgical History  Procedure Date  . Tubal ligation 1984  . Total abdominal hysterectomy 1986  . Tonsillectomy     REVIEW OF SYSTEMS:  A comprehensive review of systems was negative.   PHYSICAL EXAMINATION: General appearance: alert, cooperative, appears stated age and no distress Head: Normocephalic, without obvious abnormality, atraumatic Neck: no adenopathy Lymph nodes: Cervical, supraclavicular, and axillary nodes normal. Resp: clear to auscultation bilaterally Cardio: regular rate and rhythm, S1, S2 normal, no murmur, click, rub or  gallop GI: soft, non-tender; bowel sounds normal; no masses,  no organomegaly Extremities: extremities normal, atraumatic, no cyanosis or edema Neurologic: Alert and oriented X 3, normal strength and tone. Normal symmetric reflexes. Normal coordination and gait  ECOG PERFORMANCE STATUS: 1 - Symptomatic but completely ambulatory  Pulse 67, temperature 97 F (36.1 C), temperature source Oral, weight 113 lb 6.4 oz (51.438 kg), SpO2 97.00%.  LABORATORY DATA: Lab Results  Component Value Date   WBC 11.0* 06/05/2011   HGB 11.6 06/05/2011   HCT 34.2* 06/05/2011   MCV 90.2 06/05/2011   PLT 418* 06/05/2011      Chemistry      Component Value Date/Time   NA 139 06/05/2011 0940   K 4.7 06/05/2011 0940   CL 103 06/05/2011 0940   CO2 26 06/05/2011 0940   BUN 16 06/05/2011 0940   CREATININE 0.74 06/05/2011 0940      Component Value Date/Time   CALCIUM 8.8 06/05/2011 0940   ALKPHOS 87 06/05/2011 0940   AST 19 06/05/2011 0940   ALT 16 06/05/2011 0940   BILITOT 0.3 06/05/2011 0940       RADIOGRAPHIC STUDIES: Dg Eye Foreign Body  05/16/2011  *RADIOLOGY REPORT*  Clinical Data:  History of sewing needle foreign body injury to lie.  The patient needs clearance prior to MRI  ORBITS FOR FOREIGN BODY - 2 VIEW  Comparison:  None.  Findings:  There is no evidence of metallic foreign body within the orbits.  No significant bone abnormality identified.  IMPRESSION: No evidence of metallic foreign body within the orbits.  Original Report Authenticated By: Reola Calkins, M.D.   Mr Laqueta Jean Wo Contrast  05/16/2011  *RADIOLOGY REPORT*  Clinical Data: Lung cancer restaging.  Rule out metastatic disease  MRI HEAD WITHOUT AND WITH CONTRAST  Technique:  Multiplanar, multiecho pulse sequences of the brain and surrounding structures were obtained according to standard protocol without and with intravenous contrast  Contrast: 9mL MULTIHANCE GADOBENATE DIMEGLUMINE 529 MG/ML IV SOLN  Comparison: None.   Findings: Negative for metastatic disease to the brain.  No enhancing lesions are identified.  Patchy nonenhancing white matter hyperintensities bilaterally, typical for chronic microvascular ischemia.  Negative for acute infarct.  Negative for hydrocephalus.  No hemorrhage is identified.  Mild chronic sinusitis.  IMPRESSION: Negative for metastatic disease to the brain.  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  Original Report Authenticated By: Camelia Phenes, M.D.    ASSESSMENT: This is a very pleasant 65 years old white female with limited stage small cell lung cancer currently on systemic chemotherapy with carboplatin and etoposide concurrent with radiation. The patient is status post 1 cycle of chemotherapy. She  tolerated her treatment fairly well. We'll proceed with cycle 2 today as scheduled.  PLAN: The patient come back for followup visit in 3 weeks with repeat CT scan of the chest with contrast for restaging of her disease.   All questions were answered. The patient knows to call the clinic with any problems, questions or concerns. We can certainly see the patient much sooner if necessary.

## 2011-06-07 ENCOUNTER — Ambulatory Visit (HOSPITAL_BASED_OUTPATIENT_CLINIC_OR_DEPARTMENT_OTHER): Payer: Medicare Other

## 2011-06-07 ENCOUNTER — Ambulatory Visit
Admission: RE | Admit: 2011-06-07 | Discharge: 2011-06-07 | Disposition: A | Discharge status: Home / Self Care | Payer: Medicare Other | Source: Ambulatory Visit | Attending: Radiation Oncology | Admitting: Radiation Oncology

## 2011-06-07 VITALS — BP 114/56 | HR 70 | Temp 97.7°F

## 2011-06-07 DIAGNOSIS — C3410 Malignant neoplasm of upper lobe, unspecified bronchus or lung: Secondary | ICD-10-CM

## 2011-06-07 DIAGNOSIS — C3490 Malignant neoplasm of unspecified part of unspecified bronchus or lung: Secondary | ICD-10-CM

## 2011-06-07 DIAGNOSIS — Z5111 Encounter for antineoplastic chemotherapy: Secondary | ICD-10-CM

## 2011-06-07 MED ORDER — ONDANSETRON 8 MG/50ML IVPB (CHCC)
8.0000 mg | Freq: Once | INTRAVENOUS | Status: AC
  Administered 2011-06-07: 8 mg via INTRAVENOUS

## 2011-06-07 MED ORDER — SODIUM CHLORIDE 0.9 % IV SOLN
120.0000 mg/m2 | Freq: Once | INTRAVENOUS | Status: AC
  Administered 2011-06-07: 180 mg via INTRAVENOUS
  Filled 2011-06-07: qty 9

## 2011-06-07 MED ORDER — SODIUM CHLORIDE 0.9 % IV SOLN
Freq: Once | INTRAVENOUS | Status: AC
  Administered 2011-06-07: 11:00:00 via INTRAVENOUS

## 2011-06-07 MED ORDER — DEXAMETHASONE SODIUM PHOSPHATE 10 MG/ML IJ SOLN
10.0000 mg | Freq: Once | INTRAMUSCULAR | Status: AC
  Administered 2011-06-07: 10 mg via INTRAVENOUS

## 2011-06-08 ENCOUNTER — Encounter: Payer: Self-pay | Admitting: Pulmonary Disease

## 2011-06-08 ENCOUNTER — Ambulatory Visit (HOSPITAL_BASED_OUTPATIENT_CLINIC_OR_DEPARTMENT_OTHER): Payer: Medicare Other

## 2011-06-08 ENCOUNTER — Ambulatory Visit (INDEPENDENT_AMBULATORY_CARE_PROVIDER_SITE_OTHER): Payer: Medicare Other | Admitting: Pulmonary Disease

## 2011-06-08 ENCOUNTER — Ambulatory Visit
Admission: RE | Admit: 2011-06-08 | Discharge: 2011-06-08 | Disposition: A | Discharge status: Home / Self Care | Payer: Medicare Other | Source: Ambulatory Visit | Attending: Radiation Oncology | Admitting: Radiation Oncology

## 2011-06-08 VITALS — BP 120/58 | HR 95 | Temp 98.1°F | Ht 64.0 in | Wt 117.4 lb

## 2011-06-08 VITALS — BP 121/67 | HR 90 | Temp 99.1°F

## 2011-06-08 DIAGNOSIS — J449 Chronic obstructive pulmonary disease, unspecified: Secondary | ICD-10-CM

## 2011-06-08 DIAGNOSIS — C3410 Malignant neoplasm of upper lobe, unspecified bronchus or lung: Secondary | ICD-10-CM

## 2011-06-08 DIAGNOSIS — C3490 Malignant neoplasm of unspecified part of unspecified bronchus or lung: Secondary | ICD-10-CM

## 2011-06-08 MED ORDER — TIOTROPIUM BROMIDE MONOHYDRATE 18 MCG IN CAPS: 18.0000 ug | ORAL_CAPSULE | Freq: Every day | RESPIRATORY_TRACT | Status: DC

## 2011-06-08 MED ORDER — PEGFILGRASTIM INJECTION 6 MG/0.6ML
6.0000 mg | Freq: Once | SUBCUTANEOUS | Status: AC
  Administered 2011-06-08: 6 mg via SUBCUTANEOUS
  Filled 2011-06-08: qty 0.6

## 2011-06-08 NOTE — Assessment & Plan Note (Signed)
The patient has moderate to severe airflow obstruction by her PFTs, but a lot of the issues appears to be air trapping.  I think she would benefit by the addition of Spiriva to her Advair, and feel that she does not need daliresp since she is not having recurrent exacerbations and has stopped smoking.

## 2011-06-08 NOTE — Progress Notes (Signed)
  Subjective:    Patient ID: Janice Daniels, female    DOB: [birth year], 65 y.o.   MRN: [id number]  HPI The patient comes in today for followup after her recent pulmonary function studies.  She has been diagnosed with small cell lung cancer, and is receiving chemotherapy, and is due for radiation.  Just has a long history of tobacco abuse, but has discontinued since last month.  I have congratulated her on this.  She had PFTs today that shows moderate to severe airflow obstruction by FEV1 percent, and also significant air trapping.  Surprisingly, her DLCO was normal.  I have reviewed the studies with her in detail, and answered all of her questions.   Review of Systems  Constitutional: Negative for fever and unexpected weight change.  HENT: Positive for postnasal drip. Negative for ear pain, nosebleeds, congestion, sore throat, rhinorrhea, sneezing, trouble swallowing, dental problem and sinus pressure.   Eyes: Negative for redness and itching.  Respiratory: Positive for cough. Negative for chest tightness, shortness of breath and wheezing.   Cardiovascular: Negative for palpitations and leg swelling.  Gastrointestinal: Negative for nausea and vomiting.  Genitourinary: Negative for dysuria.  Musculoskeletal: Negative for joint swelling.  Skin: Negative for rash.  Neurological: Negative for headaches.  Hematological: Does not bruise/bleed easily.  Psychiatric/Behavioral: Negative for dysphoric mood. The patient is not nervous/anxious.        Objective:   Physical Exam Well-developed female in no acute distress Nose without purulence or discharge noted Chest clear to auscultation, no wheezes Cardiac exam with regular rate and rhythm Lower extremities without edema, no cyanosis noted Alert and oriented, moves all 4 extremities.       Assessment & Plan:

## 2011-06-08 NOTE — Patient Instructions (Signed)
Stay on your advair one inhalation in am and pm everyday.  Rinse mouth well.  Stop daliresp Start spiriva one inhalation each am for next 4 weeks.  If it works well for you, fill script and stay on.  If you don't think it helps, don't fill. folllowup with me in 4mos.

## 2011-06-09 ENCOUNTER — Ambulatory Visit
Admission: RE | Admit: 2011-06-09 | Discharge: 2011-06-09 | Disposition: A | Discharge status: Home / Self Care | Payer: Medicare Other | Source: Ambulatory Visit | Attending: Radiation Oncology | Admitting: Radiation Oncology

## 2011-06-10 ENCOUNTER — Other Ambulatory Visit: Payer: Self-pay | Admitting: Internal Medicine

## 2011-06-10 DIAGNOSIS — E876 Hypokalemia: Secondary | ICD-10-CM

## 2011-06-12 ENCOUNTER — Ambulatory Visit
Admission: RE | Admit: 2011-06-12 | Discharge: 2011-06-12 | Disposition: A | Discharge status: Home / Self Care | Payer: Medicare Other | Source: Ambulatory Visit | Attending: Radiation Oncology | Admitting: Radiation Oncology

## 2011-06-12 ENCOUNTER — Other Ambulatory Visit (HOSPITAL_BASED_OUTPATIENT_CLINIC_OR_DEPARTMENT_OTHER): Payer: Medicare Other | Admitting: Lab

## 2011-06-12 DIAGNOSIS — C3490 Malignant neoplasm of unspecified part of unspecified bronchus or lung: Secondary | ICD-10-CM

## 2011-06-12 LAB — COMPREHENSIVE METABOLIC PANEL
AST: 16 U/L (ref 0–37)
Albumin: 3.9 g/dL (ref 3.5–5.2)
Alkaline Phosphatase: 111 U/L (ref 39–117)
Chloride: 102 mEq/L (ref 96–112)
Glucose, Bld: 109 mg/dL — ABNORMAL HIGH (ref 70–99)
Potassium: 4.1 mEq/L (ref 3.5–5.3)
Sodium: 135 mEq/L (ref 135–145)
Total Protein: 6.3 g/dL (ref 6.0–8.3)

## 2011-06-12 LAB — CBC WITH DIFFERENTIAL/PLATELET
BASO%: 0.2 % (ref 0.0–2.0)
EOS%: 0.4 % (ref 0.0–7.0)
MCH: 31.2 pg (ref 25.1–34.0)
MCV: 92.9 fL (ref 79.5–101.0)
MONO%: 4.9 % (ref 0.0–14.0)
RBC: 3.53 10*6/uL — ABNORMAL LOW (ref 3.70–5.45)
RDW: 13.7 % (ref 11.2–14.5)

## 2011-06-12 NOTE — Progress Notes (Signed)
Weekly Management Note:  Site:R Lung Current Dose:  1200  cGy Projected Dose: 6000  cGy  Narrative: The patient is seen today for routine under treatment assessment. CBCT/MVCT images/port films were reviewed. The chart was reviewed.   She is without complaints today. Her weight is stable. Her weight was artificially elevated secondary to fluids related to chemotherapy when she was weighed last week. She is eating well. Her next chemotherapy is on December 31. She tells me Dr. Arbutus Ped has a CT scan of her chest scheduled for December 28.  Physical Examination: There were no vitals filed for this visit..  Weight: 112 lb 4.8 oz (50.939 kg). Lungs are clear to auscultation.  Impression: Tolerating radiation therapy well.  Plan: Continue radiation therapy as planned.

## 2011-06-12 NOTE — Progress Notes (Signed)
NO C/O TODAY

## 2011-06-13 ENCOUNTER — Ambulatory Visit
Admission: RE | Admit: 2011-06-13 | Discharge: 2011-06-13 | Disposition: A | Discharge status: Home / Self Care | Payer: Medicare Other | Source: Ambulatory Visit | Attending: Radiation Oncology | Admitting: Radiation Oncology

## 2011-06-14 ENCOUNTER — Ambulatory Visit
Admission: RE | Admit: 2011-06-14 | Discharge: 2011-06-14 | Payer: Medicare Other | Source: Ambulatory Visit | Attending: Radiation Oncology | Admitting: Radiation Oncology

## 2011-06-14 ENCOUNTER — Ambulatory Visit
Admission: RE | Admit: 2011-06-14 | Discharge: 2011-06-14 | Disposition: A | Discharge status: Home / Self Care | Payer: Medicare Other | Source: Ambulatory Visit | Attending: Radiation Oncology | Admitting: Radiation Oncology

## 2011-06-14 NOTE — Progress Notes (Signed)
POST SIM ED DONE WITH PATIENT, PT VERY FAMILIAR WITH POTENTIAL SIDE EFFECTS AND STATED THESE TO ME.  SHE SAID THAT SHE ALREADY HAD A BOOK LIKE I TRIED TO GIVE HER, RADIATION AND YOU.  SHE SAID SHE HAD READ ALL OF IT.  WE DID REVIEW SOME OF THE INFO AND I ANSWERED QUESTIONS FOR HER.  SHE WAS ACCOMPANIED BY HER SISTER

## 2011-06-15 ENCOUNTER — Ambulatory Visit
Admission: RE | Admit: 2011-06-15 | Discharge: 2011-06-15 | Disposition: A | Discharge status: Home / Self Care | Payer: Medicare Other | Source: Ambulatory Visit | Attending: Radiation Oncology | Admitting: Radiation Oncology

## 2011-06-15 LAB — AFB CULTURE WITH SMEAR (NOT AT ARMC)
Acid Fast Smear: NONE SEEN
Special Requests: NORMAL

## 2011-06-16 ENCOUNTER — Ambulatory Visit
Admission: RE | Admit: 2011-06-16 | Discharge: 2011-06-16 | Disposition: A | Discharge status: Home / Self Care | Payer: Medicare Other | Source: Ambulatory Visit | Attending: Radiation Oncology | Admitting: Radiation Oncology

## 2011-06-19 ENCOUNTER — Ambulatory Visit
Admission: RE | Admit: 2011-06-19 | Discharge: 2011-06-19 | Disposition: A | Discharge status: Home / Self Care | Payer: Medicare Other | Source: Ambulatory Visit | Attending: Radiation Oncology | Admitting: Radiation Oncology

## 2011-06-19 ENCOUNTER — Other Ambulatory Visit (HOSPITAL_BASED_OUTPATIENT_CLINIC_OR_DEPARTMENT_OTHER): Payer: Medicare Other | Admitting: Lab

## 2011-06-19 DIAGNOSIS — C3490 Malignant neoplasm of unspecified part of unspecified bronchus or lung: Secondary | ICD-10-CM

## 2011-06-19 LAB — COMPREHENSIVE METABOLIC PANEL
AST: 16 U/L (ref 0–37)
Albumin: 3.8 g/dL (ref 3.5–5.2)
Alkaline Phosphatase: 96 U/L (ref 39–117)
Potassium: 4.1 mEq/L (ref 3.5–5.3)
Sodium: 143 mEq/L (ref 135–145)
Total Protein: 6 g/dL (ref 6.0–8.3)

## 2011-06-19 LAB — CBC WITH DIFFERENTIAL/PLATELET
Basophils Absolute: 0 10*3/uL (ref 0.0–0.1)
EOS%: 0.2 % (ref 0.0–7.0)
HGB: 10.6 g/dL — ABNORMAL LOW (ref 11.6–15.9)
MCH: 31.3 pg (ref 25.1–34.0)
MCV: 93.6 fL (ref 79.5–101.0)
MONO%: 7 % (ref 0.0–14.0)
RBC: 3.37 10*6/uL — ABNORMAL LOW (ref 3.70–5.45)
RDW: 14.1 % (ref 11.2–14.5)

## 2011-06-21 ENCOUNTER — Telehealth: Payer: Self-pay | Admitting: Radiation Oncology

## 2011-06-21 ENCOUNTER — Ambulatory Visit
Admission: RE | Admit: 2011-06-21 | Discharge: 2011-06-21 | Disposition: A | Discharge status: Home / Self Care | Payer: Medicare Other | Source: Ambulatory Visit | Attending: Radiation Oncology | Admitting: Radiation Oncology

## 2011-06-21 ENCOUNTER — Encounter: Payer: Self-pay | Admitting: Radiation Oncology

## 2011-06-21 DIAGNOSIS — R918 Other nonspecific abnormal finding of lung field: Secondary | ICD-10-CM

## 2011-06-21 DIAGNOSIS — C3490 Malignant neoplasm of unspecified part of unspecified bronchus or lung: Secondary | ICD-10-CM

## 2011-06-21 MED ORDER — BIAFINE EX EMUL
Freq: Every day | CUTANEOUS | Status: DC
  Administered 2011-06-21: 15:00:00 via TOPICAL

## 2011-06-21 NOTE — Telephone Encounter (Signed)
Left message for patient at her request. Per Dr. Luciano Cutter order the patient should go for her CT scan on 06/23/11 at 1230 as ordered by Dr. Gwenyth Bouillon despite the fact that she is 3 weeks into radiation therapy. Encouraged patient to call with needs or questions.

## 2011-06-21 NOTE — Progress Notes (Signed)
Patient presents to the clinic today accompanied by a family member for an under treat visit with Dr. Michell Heinrich. Patient is alert and oriented to person, place, and time. No distress noted. Steady gait noted. Pleasant affect noted. Patient denies pain at this time. Patient report that recent she has notice small painful pimples on her scalp. Patient reports that she applies Neosporin to these place and they disappear within a few days. Patient reports a dry cough and runny nose. Patient denies shortness of breath

## 2011-06-21 NOTE — Progress Notes (Signed)
Weekly Management Note Current Dose:24 Gy  Projected Dose:60 Gy   Narrative:  The patient presents for routine under treatment assessment.  CBCT/MVCT images/Port film x-rays were reviewed.  The chart was checked. Doing well. No swallowing difficulties. Some increased cough. Has spots on head that have resolved with neosporin.   Physical Findings:  Pink back. Alert and oriented. 3-4 small scabs on scalp.   Vitals:  Filed Vitals:   06/21/11 1419  BP: 117/68  Pulse: 96  Resp: 18   Weight:  Wt Readings from Last 3 Encounters:  06/21/11 114 lb 1.6 oz (51.755 kg)  06/12/11 112 lb 4.8 oz (50.939 kg)  06/08/11 117 lb 6.4 oz (53.252 kg)   Lab Results  Component Value Date   WBC 9.9 06/19/2011   HGB 10.6* 06/19/2011   HCT 31.6* 06/19/2011   MCV 93.6 06/19/2011   PLT 148 06/19/2011   Lab Results  Component Value Date   CREATININE 0.85 06/19/2011   BUN 10 06/19/2011   NA 143 06/19/2011   K 4.1 06/19/2011   CL 108 06/19/2011   CO2 29 06/19/2011     Impression:  The patient is tolerating radiation.  Plan:  Continue treatment as planned. Doing really well. Has CT scheduled for Friday for unclear reasons.  Will check with [facility].  Add biafene to chest.

## 2011-06-21 NOTE — Progress Notes (Signed)
Encounter addended by: Ardell Isaacs on: 06/21/2011  2:49 PM<BR>     Documentation filed: Inpatient MAR, Orders

## 2011-06-22 ENCOUNTER — Ambulatory Visit
Admission: RE | Admit: 2011-06-22 | Discharge: 2011-06-22 | Disposition: A | Discharge status: Home / Self Care | Payer: Medicare Other | Source: Ambulatory Visit | Attending: Radiation Oncology | Admitting: Radiation Oncology

## 2011-06-23 ENCOUNTER — Ambulatory Visit
Admission: RE | Admit: 2011-06-23 | Discharge: 2011-06-23 | Disposition: A | Discharge status: Home / Self Care | Payer: Medicare Other | Source: Ambulatory Visit | Attending: Radiation Oncology | Admitting: Radiation Oncology

## 2011-06-23 ENCOUNTER — Ambulatory Visit (HOSPITAL_COMMUNITY)
Admission: RE | Admit: 2011-06-23 | Discharge: 2011-06-23 | Disposition: A | Discharge status: Home / Self Care | Payer: Medicare Other | Source: Ambulatory Visit | Attending: Internal Medicine | Admitting: Internal Medicine

## 2011-06-23 DIAGNOSIS — R059 Cough, unspecified: Secondary | ICD-10-CM | POA: Insufficient documentation

## 2011-06-23 DIAGNOSIS — M5144 Schmorl's nodes, thoracic region: Secondary | ICD-10-CM | POA: Insufficient documentation

## 2011-06-23 DIAGNOSIS — Z87891 Personal history of nicotine dependence: Secondary | ICD-10-CM | POA: Insufficient documentation

## 2011-06-23 DIAGNOSIS — Z923 Personal history of irradiation: Secondary | ICD-10-CM | POA: Insufficient documentation

## 2011-06-23 DIAGNOSIS — C3490 Malignant neoplasm of unspecified part of unspecified bronchus or lung: Secondary | ICD-10-CM

## 2011-06-23 DIAGNOSIS — Z9221 Personal history of antineoplastic chemotherapy: Secondary | ICD-10-CM | POA: Insufficient documentation

## 2011-06-23 DIAGNOSIS — M949 Disorder of cartilage, unspecified: Secondary | ICD-10-CM | POA: Insufficient documentation

## 2011-06-23 DIAGNOSIS — R079 Chest pain, unspecified: Secondary | ICD-10-CM | POA: Insufficient documentation

## 2011-06-23 DIAGNOSIS — C3410 Malignant neoplasm of upper lobe, unspecified bronchus or lung: Secondary | ICD-10-CM | POA: Insufficient documentation

## 2011-06-23 DIAGNOSIS — M899 Disorder of bone, unspecified: Secondary | ICD-10-CM | POA: Insufficient documentation

## 2011-06-23 DIAGNOSIS — J438 Other emphysema: Secondary | ICD-10-CM | POA: Insufficient documentation

## 2011-06-23 DIAGNOSIS — R05 Cough: Secondary | ICD-10-CM | POA: Insufficient documentation

## 2011-06-23 DIAGNOSIS — M5146 Schmorl's nodes, lumbar region: Secondary | ICD-10-CM | POA: Insufficient documentation

## 2011-06-23 MED ORDER — IOHEXOL 300 MG/ML  SOLN
80.0000 mL | Freq: Once | INTRAMUSCULAR | Status: AC | PRN
  Administered 2011-06-23: 80 mL via INTRAVENOUS

## 2011-06-26 ENCOUNTER — Other Ambulatory Visit (HOSPITAL_BASED_OUTPATIENT_CLINIC_OR_DEPARTMENT_OTHER): Payer: Medicare Other | Admitting: Lab

## 2011-06-26 ENCOUNTER — Other Ambulatory Visit: Payer: Self-pay | Admitting: Certified Registered Nurse Anesthetist

## 2011-06-26 ENCOUNTER — Ambulatory Visit
Admission: RE | Admit: 2011-06-26 | Discharge: 2011-06-26 | Disposition: A | Discharge status: Home / Self Care | Payer: Medicare Other | Source: Ambulatory Visit | Attending: Radiation Oncology | Admitting: Radiation Oncology

## 2011-06-26 ENCOUNTER — Ambulatory Visit: Payer: Medicare Other | Admitting: Internal Medicine

## 2011-06-26 ENCOUNTER — Ambulatory Visit (HOSPITAL_BASED_OUTPATIENT_CLINIC_OR_DEPARTMENT_OTHER): Payer: Medicare Other | Admitting: Internal Medicine

## 2011-06-26 ENCOUNTER — Ambulatory Visit (HOSPITAL_BASED_OUTPATIENT_CLINIC_OR_DEPARTMENT_OTHER): Payer: Medicare Other

## 2011-06-26 VITALS — BP 131/79 | HR 103 | Temp 97.4°F | Ht 64.0 in | Wt 113.0 lb

## 2011-06-26 DIAGNOSIS — C3490 Malignant neoplasm of unspecified part of unspecified bronchus or lung: Secondary | ICD-10-CM

## 2011-06-26 DIAGNOSIS — C3410 Malignant neoplasm of upper lobe, unspecified bronchus or lung: Secondary | ICD-10-CM

## 2011-06-26 DIAGNOSIS — Z5111 Encounter for antineoplastic chemotherapy: Secondary | ICD-10-CM

## 2011-06-26 DIAGNOSIS — R918 Other nonspecific abnormal finding of lung field: Secondary | ICD-10-CM

## 2011-06-26 LAB — CBC WITH DIFFERENTIAL/PLATELET
BASO%: 0.1 % (ref 0.0–2.0)
EOS%: 0.1 % (ref 0.0–7.0)
LYMPH%: 17.6 % (ref 14.0–49.7)
MCH: 31 pg (ref 25.1–34.0)
MCHC: 33.6 g/dL (ref 31.5–36.0)
MONO#: 0.9 10*3/uL (ref 0.1–0.9)
NEUT%: 72.8 % (ref 38.4–76.8)
Platelets: 320 10*3/uL (ref 145–400)
RBC: 3.68 10*6/uL — ABNORMAL LOW (ref 3.70–5.45)
WBC: 9.4 10*3/uL (ref 3.9–10.3)
lymph#: 1.7 10*3/uL (ref 0.9–3.3)
nRBC: 0 % (ref 0–0)

## 2011-06-26 LAB — COMPREHENSIVE METABOLIC PANEL
ALT: 13 U/L (ref 0–35)
AST: 16 U/L (ref 0–37)
Alkaline Phosphatase: 76 U/L (ref 39–117)
BUN: 15 mg/dL (ref 6–23)
Calcium: 8.9 mg/dL (ref 8.4–10.5)
Chloride: 105 mEq/L (ref 96–112)
Creatinine, Ser: 0.69 mg/dL (ref 0.50–1.10)
Total Bilirubin: 0.3 mg/dL (ref 0.3–1.2)

## 2011-06-26 MED ORDER — ETOPOSIDE CHEMO INJECTION 20 MG/ML
120.0000 mg/m2 | Freq: Once | INTRAVENOUS | Status: AC
  Administered 2011-06-26: 180 mg via INTRAVENOUS
  Filled 2011-06-26: qty 9

## 2011-06-26 MED ORDER — DEXAMETHASONE SODIUM PHOSPHATE 4 MG/ML IJ SOLN
20.0000 mg | Freq: Once | INTRAMUSCULAR | Status: AC
  Administered 2011-06-26: 20 mg via INTRAVENOUS

## 2011-06-26 MED ORDER — SODIUM CHLORIDE 0.9 % IV SOLN
371.5000 mg | Freq: Once | INTRAVENOUS | Status: AC
  Administered 2011-06-26: 370 mg via INTRAVENOUS
  Filled 2011-06-26: qty 37

## 2011-06-26 MED ORDER — ONDANSETRON 16 MG/50ML IVPB (CHCC)
16.0000 mg | Freq: Once | INTRAVENOUS | Status: AC
  Administered 2011-06-26: 16 mg via INTRAVENOUS

## 2011-06-26 MED ORDER — SODIUM CHLORIDE 0.9 % IV SOLN
Freq: Once | INTRAVENOUS | Status: AC
  Administered 2011-06-26: 11:00:00 via INTRAVENOUS

## 2011-06-26 NOTE — Progress Notes (Signed)
[facility] OFFICE PROGRESS NOTE  Garlan Fillers, MD, MD [address] [facility], [place]. [place] [address]  DIAGNOSIS: Limited stage small cell lung cancer diagnosed in October of 2012 .   PRIOR THERAPY: None.   CURRENT THERAPY: Systemic chemotherapy with carboplatin for AUC of 5 on day 1 and etoposide 120 mg/M2 on days 1,2 and 3 with Neulasta support on day 4, status post 2 cycle now with concurrent with radiotherapy.   INTERVAL HISTORY: Janice Daniels 65 y.o. female returns to the clinic today for followup visit. The patient related the first 2 cycles of her chemotherapy fairly well. She started concurrent radiation with cycle #2. She denied having any significant nausea or vomiting, no chest pain or shortness of breath, no cough or hemoptysis. She has no weight loss or night sweats. The patient has repeat CT scan of the chest performed recently and she is here today for evaluation and discussion of her scan results.   MEDICAL HISTORY: Past Medical History  Diagnosis Date  . Hypertension   . Hypothyroid   . GERD (gastroesophageal reflux disease)   . COPD (chronic obstructive pulmonary disease)   . Low back pain   . Lung cancer 05/09/2011    RUL  . Depression     ALLERGIES:  is allergic to codeine.  MEDICATIONS:  Current Outpatient Prescriptions  Medication Sig Dispense Refill  . acetaminophen (TYLENOL) 500 MG tablet Take 500 mg by mouth every 6 (six) hours as needed.        Marland Kitchen albuterol (PROVENTIL) (2.5 MG/3ML) 0.083% nebulizer solution Take 2.5 mg by nebulization every 4 (four) hours as needed.        Marland Kitchen albuterol (VENTOLIN HFA) 108 (90 BASE) MCG/ACT inhaler Inhale 2 puffs into the lungs every 6 (six) hours as needed.        . benazepril-hydrochlorthiazide (LOTENSIN HCT) 10-12.5 MG per tablet Take 1 tablet by mouth daily as needed.       . citalopram (CELEXA) 10 MG tablet Take 10 mg by mouth daily.        Marland Kitchen docusate sodium  (COLACE) 100 MG capsule Take 100 mg by mouth 2 (two) times daily. 2 po bid       . Fluticasone-Salmeterol (ADVAIR) 250-50 MCG/DOSE AEPB Inhale 1 puff into the lungs 2 (two) times daily.        Marland Kitchen levothyroxine (SYNTHROID, LEVOTHROID) 112 MCG tablet Take 112 mcg by mouth daily.        Marland Kitchen loratadine (CLARITIN) 10 MG tablet Take 10 mg by mouth daily. prn       . methylPREDNISolone (MEDROL DOSEPAK) 4 MG tablet Take by mouth as directed. follow package directions. ON HOLD      . Multiple Vitamin (MULTIVITAMIN) tablet Take 1 tablet by mouth daily.        Marland Kitchen omeprazole (PRILOSEC OTC) 20 MG tablet Take 20 mg by mouth daily.       . potassium chloride SA (K-DUR,KLOR-CON) 20 MEQ tablet TAKE 1 DAILY FOR 7 DAYS  7 tablet  0  . prochlorperazine (COMPAZINE) 10 MG tablet Take 10 mg by mouth every 6 (six) hours as needed.        . roflumilast (DALIRESP) 500 MCG TABS tablet Take 500 mcg by mouth daily.        Marland Kitchen tiotropium (SPIRIVA HANDIHALER) 18 MCG inhalation capsule Place 1 capsule (18 mcg total) into inhaler and inhale daily.  30 capsule  6    SURGICAL HISTORY:  Past Surgical History  Procedure Date  . Tubal ligation 1984  . Total abdominal hysterectomy 1986  . Tonsillectomy     REVIEW OF SYSTEMS:  A comprehensive review of systems was negative.   PHYSICAL EXAMINATION: General appearance: alert, cooperative and no distress Head: Normocephalic, without obvious abnormality, atraumatic Neck: no adenopathy Lymph nodes: Cervical, supraclavicular, and axillary nodes normal. Resp: clear to auscultation bilaterally Cardio: regular rate and rhythm, S1, S2 normal, no murmur, click, rub or gallop GI: soft, non-tender; bowel sounds normal; no masses,  no organomegaly Extremities: extremities normal, atraumatic, no cyanosis or edema Neurologic: Alert and oriented X 3, normal strength and tone. Normal symmetric reflexes. Normal coordination and gait  ECOG PERFORMANCE STATUS: 0 - Asymptomatic  Blood pressure  131/79, pulse 103, temperature 97.4 F (36.3 C), temperature source Oral, height 5\' 4"  (1.626 m), weight 113 lb (51.256 kg).  LABORATORY DATA: Lab Results  Component Value Date   WBC 9.4 06/26/2011   HGB 11.4* 06/26/2011   HCT 33.9* 06/26/2011   MCV 92.1 06/26/2011   PLT 320 06/26/2011      Chemistry      Component Value Date/Time   NA 143 06/19/2011 1126   K 4.1 06/19/2011 1126   CL 108 06/19/2011 1126   CO2 29 06/19/2011 1126   BUN 10 06/19/2011 1126   CREATININE 0.85 06/19/2011 1126      Component Value Date/Time   CALCIUM 8.4 06/19/2011 1126   ALKPHOS 96 06/19/2011 1126   AST 16 06/19/2011 1126   ALT 12 06/19/2011 1126   BILITOT 0.2* 06/19/2011 1126       RADIOGRAPHIC STUDIES: Ct Chest W Contrast  06/23/2011  *RADIOLOGY REPORT*  Clinical Data: Lung cancer diagnosed in 9/12.  Chemotherapy and radiation therapy in progress.  Chest pain.  Cough.  Recently quit smoking.  CT CHEST WITH CONTRAST  Technique:  Multidetector CT imaging of the chest was performed following the standard protocol during bolus administration of intravenous contrast.  Contrast: 80mL OMNIPAQUE IOHEXOL 300 MG/ML IV SOLN  Comparison: Plain film 05/03/2011.  PET of 04/20/2011.  Diagnostic CT of 03/22/2011.  Findings: Lung windows demonstrate probable secretions or fluid within the right lower lobe endobronchial tree on image 40.  This is new.  Remainder of airway patent.  Moderate centrilobular emphysema.  A right apical lung nodule measures 7 mm on image 10 and is similar to on the prior (when remeasured).  Mild nonspecific interstitial opacity in the right apex is similar. Left lung remains clear.  Inferior right upper lobe lung nodule is decreased.  Measures 2.6 x 2.3 cm on image 24 today versus 5.3 x 3.6 cm at the same level on the prior.  On the sagittal image 28, it measures 1.3 cm today versus 2.8 cm on the prior.  Soft tissue windows demonstrate aortic atherosclerosis without aneurysm. Normal heart size  without pericardial or pleural effusion.  No central pulmonary embolism, on this non-dedicated study.  No mediastinal adenopathy.  Infiltrative right hilar adenopathy is improved.  This measures greater short axis 1.4 cm on image 24 versus 1.8 cm at the same level on the prior.  No left hilar adenopathy.  Limited abdominal imaging demonstrates normal adrenal glands.  Mild osteopenia Schmorl's node deformities involving superior endplates at T12 and L2.  IMPRESSION:  1.  Response to therapy of inferior right upper lobe lung nodule and adjacent right hilar adenopathy. 2.  Centrilobular emphysema with a stable 7 mm right apical lung nodule.  This remains indeterminate.  3.  No new or progressive disease. 4.  Probable secretions or fluid in the endobronchial tree to the right lower lobe.  Recommend attention on follow-up to exclude unlikely endobronchial tumor spread.  Also consider exclusion of aspiration clinically.  Original Report Authenticated By: Consuello Bossier, M.D.    ASSESSMENT: This is a very pleasant 20 white female with limited stage small cell lung cancer status post 2 cycles of systemic chemotherapy with carboplatin and etoposide. She is also currently receiving concurrent radiotherapy. The patient is doing fine and she has significant improvement in her disease after the 2 cycles of treatment. I discussed the scan results and showed the images to the patient.  PLAN: I recommended for her to continue with systemic chemotherapy with the same regimen. She will receive cycle #3 starting today in addition to concurrent radiotherapy. She would come back for followup visit in 3 weeks with the start of cycle #4.  All questions were answered. The patient knows to call the clinic with any problems, questions or concerns. We can certainly see the patient much sooner if necessary.

## 2011-06-26 NOTE — Progress Notes (Addendum)
Denies any pain, SOB or fatigue. Using Biafine  for erythema in posterior/ anterior  tx. Field.  15/30 fractions.  To have chemotherapy today.

## 2011-06-26 NOTE — Progress Notes (Signed)
Weekly Management Note:  Site:R  Lung/hilum Current Dose:  3000  cGy Projected Dose: 6000  cGy  Narrative: The patient is seen today for routine under treatment assessment. CBCT/MVCT images/port films were reviewed. The chart was reviewed.   She is generally doing well and is using Biafine cream along her chest for dermatitis. Her CBC and chemistries from last week are satisfactory. She is to chemotherapy today. She is also to have repeat labs today  Physical Examination:  Filed Vitals:   06/26/11 0931  BP: 110/66  Pulse: 97  Temp: 97.8 F (36.6 C)  .  Weight: 113 lb (51.256 kg). No change.  Impression: Tolerating radiation therapy well.  Plan: Continue radiation therapy as planned.

## 2011-06-27 ENCOUNTER — Other Ambulatory Visit: Payer: Self-pay | Admitting: Internal Medicine

## 2011-06-28 ENCOUNTER — Ambulatory Visit (HOSPITAL_BASED_OUTPATIENT_CLINIC_OR_DEPARTMENT_OTHER): Payer: Medicare Other

## 2011-06-28 ENCOUNTER — Ambulatory Visit
Admission: RE | Admit: 2011-06-28 | Discharge: 2011-06-28 | Disposition: A | Discharge status: Home / Self Care | Payer: Medicare Other | Source: Ambulatory Visit | Attending: Radiation Oncology | Admitting: Radiation Oncology

## 2011-06-28 VITALS — BP 118/78 | HR 98 | Temp 98.2°F

## 2011-06-28 DIAGNOSIS — C3490 Malignant neoplasm of unspecified part of unspecified bronchus or lung: Secondary | ICD-10-CM

## 2011-06-28 DIAGNOSIS — Z5111 Encounter for antineoplastic chemotherapy: Secondary | ICD-10-CM

## 2011-06-28 MED ORDER — SODIUM CHLORIDE 0.9 % IV SOLN
Freq: Once | INTRAVENOUS | Status: AC
  Administered 2011-06-28: 16:00:00 via INTRAVENOUS

## 2011-06-28 MED ORDER — SODIUM CHLORIDE 0.9 % IV SOLN
120.0000 mg/m2 | Freq: Once | INTRAVENOUS | Status: AC
  Administered 2011-06-28: 180 mg via INTRAVENOUS
  Filled 2011-06-28: qty 9

## 2011-06-28 MED ORDER — DEXAMETHASONE SODIUM PHOSPHATE 10 MG/ML IJ SOLN
10.0000 mg | Freq: Once | INTRAMUSCULAR | Status: AC
  Administered 2011-06-28: 10 mg via INTRAVENOUS

## 2011-06-28 MED ORDER — ONDANSETRON 8 MG/50ML IVPB (CHCC)
8.0000 mg | Freq: Once | INTRAVENOUS | Status: AC
  Administered 2011-06-28: 8 mg via INTRAVENOUS

## 2011-06-29 ENCOUNTER — Ambulatory Visit: Payer: Medicare Other

## 2011-06-29 ENCOUNTER — Ambulatory Visit (HOSPITAL_BASED_OUTPATIENT_CLINIC_OR_DEPARTMENT_OTHER): Payer: Medicare Other

## 2011-06-29 ENCOUNTER — Ambulatory Visit
Admission: RE | Admit: 2011-06-29 | Discharge: 2011-06-29 | Disposition: A | Discharge status: Home / Self Care | Payer: Medicare Other | Source: Ambulatory Visit | Attending: Radiation Oncology | Admitting: Radiation Oncology

## 2011-06-29 VITALS — BP 131/81 | HR 102 | Temp 98.0°F

## 2011-06-29 DIAGNOSIS — Z5111 Encounter for antineoplastic chemotherapy: Secondary | ICD-10-CM

## 2011-06-29 DIAGNOSIS — C3410 Malignant neoplasm of upper lobe, unspecified bronchus or lung: Secondary | ICD-10-CM

## 2011-06-29 DIAGNOSIS — C3490 Malignant neoplasm of unspecified part of unspecified bronchus or lung: Secondary | ICD-10-CM

## 2011-06-29 MED ORDER — SODIUM CHLORIDE 0.9 % IV SOLN
120.0000 mg/m2 | Freq: Once | INTRAVENOUS | Status: AC
  Administered 2011-06-29: 180 mg via INTRAVENOUS
  Filled 2011-06-29: qty 9

## 2011-06-29 MED ORDER — ONDANSETRON 8 MG/50ML IVPB (CHCC)
8.0000 mg | Freq: Once | INTRAVENOUS | Status: AC
  Administered 2011-06-29: 8 mg via INTRAVENOUS

## 2011-06-29 MED ORDER — DEXAMETHASONE SODIUM PHOSPHATE 10 MG/ML IJ SOLN
10.0000 mg | Freq: Once | INTRAMUSCULAR | Status: AC
  Administered 2011-06-29: 10 mg via INTRAVENOUS

## 2011-06-29 MED ORDER — SODIUM CHLORIDE 0.9 % IV SOLN
Freq: Once | INTRAVENOUS | Status: AC
  Administered 2011-06-29: 12:00:00 via INTRAVENOUS

## 2011-06-29 NOTE — Patient Instructions (Signed)
Patient ambulatory out of clinic with family member.  Instructed patient to call with any issues.  Patient aware of next appointment

## 2011-06-30 ENCOUNTER — Ambulatory Visit (HOSPITAL_BASED_OUTPATIENT_CLINIC_OR_DEPARTMENT_OTHER): Payer: Medicare Other

## 2011-06-30 ENCOUNTER — Ambulatory Visit
Admission: RE | Admit: 2011-06-30 | Discharge: 2011-06-30 | Disposition: A | Discharge status: Home / Self Care | Payer: Medicare Other | Source: Ambulatory Visit | Attending: Radiation Oncology | Admitting: Radiation Oncology

## 2011-06-30 VITALS — BP 114/63 | HR 113 | Temp 98.6°F

## 2011-06-30 DIAGNOSIS — C3490 Malignant neoplasm of unspecified part of unspecified bronchus or lung: Secondary | ICD-10-CM

## 2011-06-30 MED ORDER — PEGFILGRASTIM INJECTION 6 MG/0.6ML
6.0000 mg | Freq: Once | SUBCUTANEOUS | Status: AC
  Administered 2011-06-30: 6 mg via SUBCUTANEOUS
  Filled 2011-06-30: qty 0.6

## 2011-07-03 ENCOUNTER — Encounter: Payer: Self-pay | Admitting: Radiation Oncology

## 2011-07-03 ENCOUNTER — Ambulatory Visit
Admission: RE | Admit: 2011-07-03 | Discharge: 2011-07-03 | Disposition: A | Discharge status: Home / Self Care | Payer: Medicare Other | Source: Ambulatory Visit | Attending: Radiation Oncology | Admitting: Radiation Oncology

## 2011-07-03 ENCOUNTER — Other Ambulatory Visit (HOSPITAL_BASED_OUTPATIENT_CLINIC_OR_DEPARTMENT_OTHER): Payer: Medicare Other | Admitting: Lab

## 2011-07-03 VITALS — BP 134/69 | HR 118 | Resp 20 | Wt 113.8 lb

## 2011-07-03 DIAGNOSIS — C3490 Malignant neoplasm of unspecified part of unspecified bronchus or lung: Secondary | ICD-10-CM

## 2011-07-03 LAB — COMPREHENSIVE METABOLIC PANEL
AST: 12 U/L (ref 0–37)
Albumin: 3.9 g/dL (ref 3.5–5.2)
Alkaline Phosphatase: 102 U/L (ref 39–117)
BUN: 25 mg/dL — ABNORMAL HIGH (ref 6–23)
Potassium: 4.1 mEq/L (ref 3.5–5.3)

## 2011-07-03 LAB — CBC WITH DIFFERENTIAL/PLATELET
Basophils Absolute: 0 10*3/uL (ref 0.0–0.1)
EOS%: 0.3 % (ref 0.0–7.0)
MCH: 32.5 pg (ref 25.1–34.0)
MCV: 95.1 fL (ref 79.5–101.0)
MONO%: 0.2 % (ref 0.0–14.0)
RBC: 2.95 10*6/uL — ABNORMAL LOW (ref 3.70–5.45)
RDW: 19.7 % — ABNORMAL HIGH (ref 11.2–14.5)

## 2011-07-03 NOTE — Progress Notes (Signed)
Patient presents to the clinic today unaccompanied for under treat visit with Dr. Dayton Scrape. Patient is alert and oriented to person, place and time. No distress noted. Steady gait noted. Pleasant affect noted. Patient denies pain at this time. Patient reports fatigue. Patient states,"I stayed in the bed all weekend because I was so tired." Patient reports sore throat, bronchospasms, and difficulty swallowing. Patient reports a intermittent productive cough with yellow sputum.  Reported all findings to Dr. Dayton Scrape.

## 2011-07-03 NOTE — Progress Notes (Signed)
Weekly Management Note:  Site:R Lung/Hilum Current Dose:  3600  cGy Projected Dose: 6000  cGy  Narrative: The patient is seen today for routine under treatment assessment. CBCT/MVCT images/port films were reviewed. The chart was reviewed.   She had a fair amount of fatigue over the weekend. He tells me her last chemotherapy is on January 23. She does have periodic mild dysphagia. CBC from earlier today is satisfactory.  Physical Examination:  Filed Vitals:   07/03/11 1433  BP: 134/69  Pulse: 118  Resp: 20  .  Weight: 113 lb 12.8 oz (51.619 kg). Lungs: Clear.  Impression: Tolerating radiation therapy well.  Plan: Continue radiation therapy as planned.

## 2011-07-04 ENCOUNTER — Ambulatory Visit
Admission: RE | Admit: 2011-07-04 | Discharge: 2011-07-04 | Disposition: A | Discharge status: Home / Self Care | Payer: Medicare Other | Source: Ambulatory Visit | Attending: Radiation Oncology | Admitting: Radiation Oncology

## 2011-07-05 ENCOUNTER — Ambulatory Visit
Admission: RE | Admit: 2011-07-05 | Discharge: 2011-07-05 | Disposition: A | Discharge status: Home / Self Care | Payer: Medicare Other | Source: Ambulatory Visit | Attending: Radiation Oncology | Admitting: Radiation Oncology

## 2011-07-06 ENCOUNTER — Ambulatory Visit
Admission: RE | Admit: 2011-07-06 | Discharge: 2011-07-06 | Disposition: A | Discharge status: Home / Self Care | Payer: Medicare Other | Source: Ambulatory Visit | Attending: Radiation Oncology | Admitting: Radiation Oncology

## 2011-07-07 ENCOUNTER — Ambulatory Visit
Admission: RE | Admit: 2011-07-07 | Discharge: 2011-07-07 | Disposition: A | Discharge status: Home / Self Care | Payer: Medicare Other | Source: Ambulatory Visit | Attending: Radiation Oncology | Admitting: Radiation Oncology

## 2011-07-10 ENCOUNTER — Encounter: Payer: Self-pay | Admitting: Radiation Oncology

## 2011-07-10 ENCOUNTER — Ambulatory Visit
Admission: RE | Admit: 2011-07-10 | Discharge: 2011-07-10 | Disposition: A | Discharge status: Home / Self Care | Payer: Medicare Other | Source: Ambulatory Visit | Attending: Radiation Oncology | Admitting: Radiation Oncology

## 2011-07-10 ENCOUNTER — Other Ambulatory Visit (HOSPITAL_BASED_OUTPATIENT_CLINIC_OR_DEPARTMENT_OTHER): Payer: Medicare Other | Admitting: Lab

## 2011-07-10 VITALS — BP 117/78 | HR 116 | Resp 18 | Wt 114.8 lb

## 2011-07-10 DIAGNOSIS — C3490 Malignant neoplasm of unspecified part of unspecified bronchus or lung: Secondary | ICD-10-CM

## 2011-07-10 LAB — CBC WITH DIFFERENTIAL/PLATELET
Basophils Absolute: 0 10*3/uL (ref 0.0–0.1)
EOS%: 0.4 % (ref 0.0–7.0)
HCT: 26.1 % — ABNORMAL LOW (ref 34.8–46.6)
HGB: 8.9 g/dL — ABNORMAL LOW (ref 11.6–15.9)
LYMPH%: 9.8 % — ABNORMAL LOW (ref 14.0–49.7)
MCH: 33.1 pg (ref 25.1–34.0)
MCV: 97.2 fL (ref 79.5–101.0)
MONO%: 7.7 % (ref 0.0–14.0)
NEUT%: 81.9 % — ABNORMAL HIGH (ref 38.4–76.8)
RDW: 19.4 % — ABNORMAL HIGH (ref 11.2–14.5)

## 2011-07-10 LAB — COMPREHENSIVE METABOLIC PANEL
AST: 10 U/L (ref 0–37)
Alkaline Phosphatase: 85 U/L (ref 39–117)
BUN: 10 mg/dL (ref 6–23)
Creatinine, Ser: 0.72 mg/dL (ref 0.50–1.10)
Total Bilirubin: 0.2 mg/dL — ABNORMAL LOW (ref 0.3–1.2)

## 2011-07-10 NOTE — Progress Notes (Signed)
Weekly Management Note:  Site:R lung/hilum Current Dose:  4600  cGy Projected Dose: 6000  cGy  Narrative: The patient is seen today for routine under treatment assessment. CBCT/MVCT images/port films were reviewed. The chart was reviewed.   She is to undergo well although she did have some mouth sores over the weekend. These are improved. Her weight remained stable. She tells me her last chemotherapy is the week of January 21. CBC from earlier today is satisfactory. Physical Examination:  Filed Vitals:   07/10/11 1426  BP: 117/78  Pulse: 116  Resp: 18  .  Weight: 114 lb 12.8 oz (52.073 kg). Oral cavity without masses or lesions. There is no mucositis. Lungs: Clear.  Impression: Tolerating radiation therapy well.  Plan: Continue radiation therapy as planned. She will finish her radiation therapy next week.

## 2011-07-10 NOTE — Progress Notes (Signed)
Janice Daniels presents to the clinic today unaccompanied for under treat visit with Dr. Dayton Scrape. Janice Daniels is alert and oriented to person, place, and time. No distress noted. Steady gait noted. Pleasant affect noted. Janice Daniels denies pain at this time. Janice Daniels reports persistent productive cough with thick clear sputum. Janice Daniels reports sores in her mouth presented over the weekend. Also, Janice Daniels reports a sore throat. Janice Daniels reports the sore throat and sores in her mouth keep her from wanting to eat. Also, Janice Daniels reports using Biotene and baking soda rinse to relieve these discomforts. Janice Daniels reports she was unable sleep very much during the night due to persistent cough. Reported all findings to Dr. Dayton Scrape.

## 2011-07-11 ENCOUNTER — Ambulatory Visit
Admission: RE | Admit: 2011-07-11 | Discharge: 2011-07-11 | Disposition: A | Discharge status: Home / Self Care | Payer: Medicare Other | Source: Ambulatory Visit | Attending: Radiation Oncology | Admitting: Radiation Oncology

## 2011-07-12 ENCOUNTER — Other Ambulatory Visit: Payer: Self-pay | Admitting: Internal Medicine

## 2011-07-12 ENCOUNTER — Ambulatory Visit
Admission: RE | Admit: 2011-07-12 | Discharge: 2011-07-12 | Disposition: A | Discharge status: Home / Self Care | Payer: Medicare Other | Source: Ambulatory Visit | Attending: Radiation Oncology | Admitting: Radiation Oncology

## 2011-07-13 ENCOUNTER — Ambulatory Visit
Admission: RE | Admit: 2011-07-13 | Discharge: 2011-07-13 | Disposition: A | Discharge status: Home / Self Care | Payer: Medicare Other | Source: Ambulatory Visit | Attending: Radiation Oncology | Admitting: Radiation Oncology

## 2011-07-14 ENCOUNTER — Ambulatory Visit
Admission: RE | Admit: 2011-07-14 | Discharge: 2011-07-14 | Disposition: A | Discharge status: Home / Self Care | Payer: Medicare Other | Source: Ambulatory Visit | Attending: Radiation Oncology | Admitting: Radiation Oncology

## 2011-07-16 ENCOUNTER — Other Ambulatory Visit: Payer: Self-pay | Admitting: Internal Medicine

## 2011-07-17 ENCOUNTER — Ambulatory Visit
Admission: RE | Admit: 2011-07-17 | Discharge: 2011-07-17 | Disposition: A | Discharge status: Home / Self Care | Payer: Medicare Other | Source: Ambulatory Visit | Attending: Radiation Oncology | Admitting: Radiation Oncology

## 2011-07-17 ENCOUNTER — Telehealth: Payer: Self-pay | Admitting: Internal Medicine

## 2011-07-17 ENCOUNTER — Encounter: Payer: Self-pay | Admitting: Physician Assistant

## 2011-07-17 ENCOUNTER — Other Ambulatory Visit: Payer: Medicare Other | Admitting: Lab

## 2011-07-17 ENCOUNTER — Ambulatory Visit (HOSPITAL_BASED_OUTPATIENT_CLINIC_OR_DEPARTMENT_OTHER): Payer: Medicare Other | Admitting: Physician Assistant

## 2011-07-17 ENCOUNTER — Encounter: Payer: Self-pay | Admitting: Radiation Oncology

## 2011-07-17 ENCOUNTER — Ambulatory Visit (HOSPITAL_BASED_OUTPATIENT_CLINIC_OR_DEPARTMENT_OTHER): Payer: Medicare Other

## 2011-07-17 VITALS — BP 111/72 | HR 106 | Temp 97.3°F | Resp 18 | Wt 117.2 lb

## 2011-07-17 VITALS — BP 105/63 | HR 105 | Temp 98.2°F | Ht 64.0 in | Wt 113.6 lb

## 2011-07-17 DIAGNOSIS — R5381 Other malaise: Secondary | ICD-10-CM

## 2011-07-17 DIAGNOSIS — C3410 Malignant neoplasm of upper lobe, unspecified bronchus or lung: Secondary | ICD-10-CM

## 2011-07-17 DIAGNOSIS — C3490 Malignant neoplasm of unspecified part of unspecified bronchus or lung: Secondary | ICD-10-CM

## 2011-07-17 DIAGNOSIS — R110 Nausea: Secondary | ICD-10-CM

## 2011-07-17 DIAGNOSIS — Z5111 Encounter for antineoplastic chemotherapy: Secondary | ICD-10-CM

## 2011-07-17 DIAGNOSIS — R5383 Other fatigue: Secondary | ICD-10-CM

## 2011-07-17 DIAGNOSIS — R05 Cough: Secondary | ICD-10-CM

## 2011-07-17 LAB — COMPREHENSIVE METABOLIC PANEL
Albumin: 4 g/dL (ref 3.5–5.2)
Alkaline Phosphatase: 70 U/L (ref 39–117)
BUN: 14 mg/dL (ref 6–23)
CO2: 27 mEq/L (ref 19–32)
Glucose, Bld: 99 mg/dL (ref 70–99)
Potassium: 4.1 mEq/L (ref 3.5–5.3)

## 2011-07-17 LAB — CBC WITH DIFFERENTIAL/PLATELET
Basophils Absolute: 0 10*3/uL (ref 0.0–0.1)
EOS%: 0.6 % (ref 0.0–7.0)
Eosinophils Absolute: 0 10*3/uL (ref 0.0–0.5)
HCT: 31.1 % — ABNORMAL LOW (ref 34.8–46.6)
HGB: 10.3 g/dL — ABNORMAL LOW (ref 11.6–15.9)
MCH: 32.3 pg (ref 25.1–34.0)
MCV: 97.5 fL (ref 79.5–101.0)
MONO%: 9.3 % (ref 0.0–14.0)
NEUT%: 64.3 % (ref 38.4–76.8)

## 2011-07-17 MED ORDER — DEXAMETHASONE SODIUM PHOSPHATE 4 MG/ML IJ SOLN
20.0000 mg | Freq: Once | INTRAMUSCULAR | Status: AC
  Administered 2011-07-17: 20 mg via INTRAVENOUS

## 2011-07-17 MED ORDER — SODIUM CHLORIDE 0.9 % IV SOLN
120.0000 mg/m2 | Freq: Once | INTRAVENOUS | Status: AC
  Administered 2011-07-17: 180 mg via INTRAVENOUS
  Filled 2011-07-17: qty 9

## 2011-07-17 MED ORDER — SODIUM CHLORIDE 0.9 % IV SOLN
416.0000 mg | Freq: Once | INTRAVENOUS | Status: AC
  Administered 2011-07-17: 420 mg via INTRAVENOUS
  Filled 2011-07-17: qty 42

## 2011-07-17 MED ORDER — BIAFINE EX EMUL
Freq: Every day | CUTANEOUS | Status: DC
  Administered 2011-07-17: 17:00:00 via TOPICAL

## 2011-07-17 MED ORDER — SODIUM CHLORIDE 0.9 % IV SOLN
Freq: Once | INTRAVENOUS | Status: AC
  Administered 2011-07-17: 12:00:00 via INTRAVENOUS

## 2011-07-17 MED ORDER — ONDANSETRON 16 MG/50ML IVPB (CHCC)
16.0000 mg | Freq: Once | INTRAVENOUS | Status: AC
  Administered 2011-07-17: 16 mg via INTRAVENOUS

## 2011-07-17 NOTE — Telephone Encounter (Signed)
Ct appt made for 2/6 and lab mkm for 2/11,printed for pt  Janice Daniels

## 2011-07-17 NOTE — Patient Instructions (Signed)
Pt d/c'd in stable condition, ambulatory and going to Radiation Tx appt..  Instructed pt to call for any new concerns or unrelieved symptoms including any fevers greater than 100.12F.  She verbalized understanding.

## 2011-07-17 NOTE — Progress Notes (Signed)
Encounter addended by: Maryln Gottron, MD on: 07/17/2011  7:06 PM<BR>     Documentation filed: Notes Section

## 2011-07-17 NOTE — Progress Notes (Signed)
Patient presents to the clinic today unaccompanied for under treat visit with Dr. Dayton Scrape. Patient is alert and oriented to person, place, and time. No distress noted. Steady gait noted. Pleasant affect noted. Patient reports hyperpigmentation of her right side and back. Patient provided with another tube of Radiaplex. Also, patient reports a persistent dry cough that causes a headache. Patient reports fluid in her left ear. Patient states,"they told me upstair to take some Mucinex for my ear." patient scheduled for a CT scan on 08/02/2011. Reported all findings to Dr. Dayton Scrape. Provided patient with an appointment card for a one month follow up. Encouraged to call with needs and patient verbalized understanding.

## 2011-07-17 NOTE — Progress Notes (Addendum)
Weekly Management Note:  Site:R Lung/hilum Current Dose:  5800  cGy Projected Dose: 6000  cGy  Narrative: The patient is seen today for routine under treatment assessment. CBCT/MVCT images/port films were reviewed. The chart was reviewed.   He has a worsening cough and it was suggested that she start Mucinex. No history of fever. She reports skin irritation along her right posterior chest. She tells me that she is scheduled for a brain scan and also CT scan of the chest on February 6 and then a followup visit with Dr. Arbutus Ped on February 11. CBC from earlier today satisfactory.  Physical Examination:  Filed Vitals:   07/17/11 1457  BP: 111/72  Pulse: 106  Temp: 97.3 F (36.3 C)  Resp: 18  .  Weight: 117 lb 3.2 oz (53.162 kg). There is dry desquamation the skin of erythema along the right posterior chest within her treatment field. Lungs are clear.  Impression: Tolerating radiation therapy well although she does have dry desquamation from radiation therapy along her right posterior chest. She was given Biafine cream to use when necessary.  Plan: Continue radiation therapy as planned. She'll finish her radiation therapy tomorrow and be scheduled for a one-month followup visit.

## 2011-07-18 ENCOUNTER — Ambulatory Visit
Admission: RE | Admit: 2011-07-18 | Discharge: 2011-07-18 | Disposition: A | Discharge status: Home / Self Care | Payer: Medicare Other | Source: Ambulatory Visit | Attending: Radiation Oncology | Admitting: Radiation Oncology

## 2011-07-18 ENCOUNTER — Ambulatory Visit (HOSPITAL_BASED_OUTPATIENT_CLINIC_OR_DEPARTMENT_OTHER): Payer: Medicare Other

## 2011-07-18 ENCOUNTER — Encounter: Payer: Self-pay | Admitting: Radiation Oncology

## 2011-07-18 VITALS — BP 125/71 | HR 105

## 2011-07-18 DIAGNOSIS — Z5111 Encounter for antineoplastic chemotherapy: Secondary | ICD-10-CM

## 2011-07-18 DIAGNOSIS — C3490 Malignant neoplasm of unspecified part of unspecified bronchus or lung: Secondary | ICD-10-CM

## 2011-07-18 MED ORDER — DEXAMETHASONE SODIUM PHOSPHATE 10 MG/ML IJ SOLN
10.0000 mg | Freq: Once | INTRAMUSCULAR | Status: AC
  Administered 2011-07-18: 10 mg via INTRAVENOUS

## 2011-07-18 MED ORDER — ONDANSETRON 8 MG/50ML IVPB (CHCC)
8.0000 mg | Freq: Once | INTRAVENOUS | Status: AC
  Administered 2011-07-18: 8 mg via INTRAVENOUS

## 2011-07-18 MED ORDER — SODIUM CHLORIDE 0.9 % IV SOLN
Freq: Once | INTRAVENOUS | Status: AC
  Administered 2011-07-18: 12:00:00 via INTRAVENOUS

## 2011-07-18 MED ORDER — SODIUM CHLORIDE 0.9 % IV SOLN
120.0000 mg/m2 | Freq: Once | INTRAVENOUS | Status: AC
  Administered 2011-07-18: 180 mg via INTRAVENOUS
  Filled 2011-07-18: qty 9

## 2011-07-18 NOTE — Progress Notes (Signed)
[facility] Radiation Oncology  Name:Janice Daniels  Date:07/18/2011           ONG:[id number] DOB:April 26, 1946   Status:outpatient    CC: Garlan Fillers, MD, MD  Dr. Woodroe Mode   REFERRING PHYSICIAN: Dr. 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 She also remains quite fatigued. Her blood counts remain satisfactory to her chemoradiation.                          PLAN: Routine followup in one month. Patient instructed to call if questions or worsening complaints in interim. She is scheduled for restaging of her chest with a CT scan of February 6 and a followup visit with Dr. Arbutus Ped on February 11. She is a candidate for prophylactic cranial irradiation provided that she has had a nice response to her chemoradiation.

## 2011-07-18 NOTE — Patient Instructions (Signed)
Pt d/c to home with family, no c/o's, verbalizes understanding of next appt.

## 2011-07-18 NOTE — Progress Notes (Signed)
Special treatment procedure: The patient underwent a special treatment procedure beginning her chemoradiation on 06/05/2011. Chemoradiation represents a special treatment procedure because of the increased acute toxicity associated with radiation therapy and chemotherapy. This is not limited to hematologic, pulmonary, and esophageal toxicities. Her weight was monitored closely along with her blood counts.

## 2011-07-18 NOTE — Progress Notes (Signed)
[facility] OFFICE PROGRESS NOTE  Garlan Fillers, MD, MD [address] [facility], [place]. [place] [address]  DIAGNOSIS: Limited stage small cell lung cancer diagnosed in October of 2012 .   PRIOR THERAPY: None.   CURRENT THERAPY: Systemic chemotherapy with carboplatin for AUC of 5 on day 1 and etoposide 120 mg/M2 on days 1,2 and 3 with Neulasta support on day 4, status post 3 cycles now with concurrent with radiotherapy.   INTERVAL HISTORY: Janice Daniels 66 y.o. female returns to the clinic today for followup visit. The patient related the first 2 cycles of her chemotherapy fairly well. She started concurrent radiation with cycle #2. She had some mild nausea this morning more of a queasy feeling that went away after she ate boiled a. Tomasa Blase reports a dry tickly type cough that is productive of clear secretions. She has mild dizziness this morning. She reports some neck soreness which she relates to her roughly 9 to sleeping. She states that she drank a lot and did a lot of coffee and turning. She denied any fever or chills. She also reports that her right hand and fingers tingle waking her up at night. If she moves the hand around a bit it resolves. She's had intermittent diarrhea that tends to depend on what she eats or drinks.   MEDICAL HISTORY: Past Medical History  Diagnosis Date  . Hypertension   . Hypothyroid   . GERD (gastroesophageal reflux disease)   . COPD (chronic obstructive pulmonary disease)   . Low back pain   . Lung cancer 05/09/2011    RUL  . Depression     ALLERGIES:  is allergic to codeine.  MEDICATIONS:  Current Outpatient Prescriptions  Medication Sig Dispense Refill  . acetaminophen (TYLENOL) 500 MG tablet Take 500 mg by mouth every 6 (six) hours as needed.        Marland Kitchen albuterol (PROVENTIL) (2.5 MG/3ML) 0.083% nebulizer solution Take 2.5 mg by nebulization every 4 (four) hours as needed.        Marland Kitchen albuterol  (VENTOLIN HFA) 108 (90 BASE) MCG/ACT inhaler Inhale 2 puffs into the lungs every 6 (six) hours as needed.        . benazepril-hydrochlorthiazide (LOTENSIN HCT) 10-12.5 MG per tablet Take 1 tablet by mouth daily as needed.       . citalopram (CELEXA) 10 MG tablet Take 10 mg by mouth daily.        Marland Kitchen docusate sodium (COLACE) 100 MG capsule Take 100 mg by mouth 2 (two) times daily. 2 po bid       . Fluticasone-Salmeterol (ADVAIR) 250-50 MCG/DOSE AEPB Inhale 1 puff into the lungs 2 (two) times daily.        Marland Kitchen levothyroxine (SYNTHROID, LEVOTHROID) 112 MCG tablet Take 112 mcg by mouth daily.        Marland Kitchen loratadine (CLARITIN) 10 MG tablet Take 10 mg by mouth daily. prn       . methylPREDNISolone (MEDROL DOSEPAK) 4 MG tablet Take by mouth as directed. follow package directions. ON HOLD      . Multiple Vitamin (MULTIVITAMIN) tablet Take 1 tablet by mouth daily.        Marland Kitchen omeprazole (PRILOSEC OTC) 20 MG tablet Take 20 mg by mouth daily.       . potassium chloride SA (K-DUR,KLOR-CON) 20 MEQ tablet TAKE 1 DAILY FOR 7 DAYS  7 tablet  0  . prochlorperazine (COMPAZINE) 10 MG tablet Take 10 mg by mouth  every 6 (six) hours as needed.        . roflumilast (DALIRESP) 500 MCG TABS tablet Take 500 mcg by mouth daily.        Marland Kitchen tiotropium (SPIRIVA HANDIHALER) 18 MCG inhalation capsule Place 1 capsule (18 mcg total) into inhaler and inhale daily.  30 capsule  6   No current facility-administered medications for this visit.   Facility-Administered Medications Ordered in Other Visits  Medication Dose Route Frequency Provider Last Rate Last Dose  . 0.9 %  sodium chloride infusion   Intravenous Once Mohamed K. Mohamed, MD      . 0.9 %  sodium chloride infusion   Intravenous Once Mohamed K. Mohamed, MD 20 mL/hr at 07/18/11 1207    . dexamethasone (DECADRON) injection 10 mg  10 mg Intravenous Once Mohamed K. Mohamed, MD   10 mg at 07/18/11 1208  . etoposide (VEPESID) 180 mg in sodium chloride 0.9 % 500 mL chemo infusion  120  mg/m2 (Treatment Plan Actual) Intravenous Once Mohamed K. Mohamed, MD   180 mg at 07/17/11 1243  . etoposide (VEPESID) 180 mg in sodium chloride 0.9 % 500 mL chemo infusion  120 mg/m2 (Treatment Plan Actual) Intravenous Once Mohamed K. Mohamed, MD 509 mL/hr at 07/18/11 1242 180 mg at 07/18/11 1242  . ondansetron (ZOFRAN) IVPB 8 mg  8 mg Intravenous Once Mohamed K. Mohamed, MD   8 mg at 07/18/11 1208  . DISCONTD: topical emolient (BIAFINE) emulsion   Topical Daily Maryln Gottron, MD        SURGICAL HISTORY:  Past Surgical History  Procedure Date  . Tubal ligation 1984  . Total abdominal hysterectomy 1986  . Tonsillectomy     REVIEW OF SYSTEMS:  A comprehensive review of systems was negative except for: Constitutional: positive for fatigue Respiratory: positive for cough Gastrointestinal: positive for diarrhea and nausea Musculoskeletal: positive for neck pain Neurological: positive for paresthesia   PHYSICAL EXAMINATION: General appearance: alert, cooperative and no distress Head: Normocephalic, without obvious abnormality, atraumatic Neck: no adenopathy Lymph nodes: Cervical, supraclavicular, and axillary nodes normal. Resp: clear to auscultation bilaterally Cardio: regular rate and rhythm, S1, S2 normal, no murmur, click, rub or gallop GI: soft, non-tender; bowel sounds normal; no masses,  no organomegaly Extremities: extremities normal, atraumatic, no cyanosis or edema Neurologic: Alert and oriented X 3, normal strength and tone. Normal symmetric reflexes. Normal coordination and gait  ECOG PERFORMANCE STATUS: 0 - Asymptomatic  Blood pressure 105/63, pulse 105, temperature 98.2 F (36.8 C), temperature source Oral, height 5\' 4"  (1.626 m), weight 113 lb 9.6 oz (51.529 kg).  LABORATORY DATA: Lab Results  Component Value Date   WBC 4.8 07/17/2011   HGB 10.3* 07/17/2011   HCT 31.1* 07/17/2011   MCV 97.5 07/17/2011   PLT 368 07/17/2011      Chemistry      Component Value  Date/Time   NA 139 07/17/2011 0946   K 4.1 07/17/2011 0946   CL 104 07/17/2011 0946   CO2 27 07/17/2011 0946   BUN 14 07/17/2011 0946   CREATININE 0.82 07/17/2011 0946      Component Value Date/Time   CALCIUM 9.3 07/17/2011 0946   ALKPHOS 70 07/17/2011 0946   AST 16 07/17/2011 0946   ALT 9 07/17/2011 0946   BILITOT 0.3 07/17/2011 0946       RADIOGRAPHIC STUDIES: Ct Chest W Contrast  06/23/2011  *RADIOLOGY REPORT*  Clinical Data: Lung cancer diagnosed in 9/12.  Chemotherapy and radiation therapy in  progress.  Chest pain.  Cough.  Recently quit smoking.  CT CHEST WITH CONTRAST  Technique:  Multidetector CT imaging of the chest was performed following the standard protocol during bolus administration of intravenous contrast.  Contrast: 80mL OMNIPAQUE IOHEXOL 300 MG/ML IV SOLN  Comparison: Plain film 05/03/2011.  PET of 04/20/2011.  Diagnostic CT of 03/22/2011.  Findings: Lung windows demonstrate probable secretions or fluid within the right lower lobe endobronchial tree on image 40.  This is new.  Remainder of airway patent.  Moderate centrilobular emphysema.  A right apical lung nodule measures 7 mm on image 10 and is similar to on the prior (when remeasured).  Mild nonspecific interstitial opacity in the right apex is similar. Left lung remains clear.  Inferior right upper lobe lung nodule is decreased.  Measures 2.6 x 2.3 cm on image 24 today versus 5.3 x 3.6 cm at the same level on the prior.  On the sagittal image 28, it measures 1.3 cm today versus 2.8 cm on the prior.  Soft tissue windows demonstrate aortic atherosclerosis without aneurysm. Normal heart size without pericardial or pleural effusion.  No central pulmonary embolism, on this non-dedicated study.  No mediastinal adenopathy.  Infiltrative right hilar adenopathy is improved.  This measures greater short axis 1.4 cm on image 24 versus 1.8 cm at the same level on the prior.  No left hilar adenopathy.  Limited abdominal imaging demonstrates  normal adrenal glands.  Mild osteopenia Schmorl's node deformities involving superior endplates at T12 and L2.  IMPRESSION:  1.  Response to therapy of inferior right upper lobe lung nodule and adjacent right hilar adenopathy. 2.  Centrilobular emphysema with a stable 7 mm right apical lung nodule.  This remains indeterminate. 3.  No new or progressive disease. 4.  Probable secretions or fluid in the endobronchial tree to the right lower lobe.  Recommend attention on follow-up to exclude unlikely endobronchial tumor spread.  Also consider exclusion of aspiration clinically.  Original Report Authenticated By: Consuello Bossier, M.D.    ASSESSMENT/PLAN: This is a very pleasant 35 white female with limited stage small cell lung cancer status post 2 cycles of systemic chemotherapy with carboplatin and etoposide. She is also currently receiving concurrent radiotherapy.  The patient was discussed with Dr. Arbutus Ped. She will proceed with cycle #4 of her systemic chemotherapy with carboplatin and etoposide with Neulasta support. She'll follow with Dr. Arbutus Ped in 3 weeks with repeat CBC differential C. met and CT of the head with and without contrast and a CT of the chest with contrast to reevaluate her disease. Dr. Arbutus Ped will discuss prophylactic whole brain radiation with her at her followup visit.  Conni Slipper, PA-C   All questions were answered. The patient knows to call the clinic with any problems, questions or concerns. We can certainly see the patient much sooner if necessary.

## 2011-07-19 ENCOUNTER — Ambulatory Visit (HOSPITAL_BASED_OUTPATIENT_CLINIC_OR_DEPARTMENT_OTHER): Payer: Medicare Other

## 2011-07-19 VITALS — BP 93/60 | HR 137 | Temp 97.6°F

## 2011-07-19 DIAGNOSIS — Z5111 Encounter for antineoplastic chemotherapy: Secondary | ICD-10-CM

## 2011-07-19 DIAGNOSIS — C3490 Malignant neoplasm of unspecified part of unspecified bronchus or lung: Secondary | ICD-10-CM

## 2011-07-19 DIAGNOSIS — C3410 Malignant neoplasm of upper lobe, unspecified bronchus or lung: Secondary | ICD-10-CM

## 2011-07-19 MED ORDER — HEPARIN SOD (PORK) LOCK FLUSH 100 UNIT/ML IV SOLN
250.0000 [IU] | Freq: Once | INTRAVENOUS | Status: DC | PRN
  Filled 2011-07-19: qty 5

## 2011-07-19 MED ORDER — SODIUM CHLORIDE 0.9 % IV SOLN
120.0000 mg/m2 | Freq: Once | INTRAVENOUS | Status: AC
  Administered 2011-07-19: 180 mg via INTRAVENOUS
  Filled 2011-07-19: qty 9

## 2011-07-19 MED ORDER — SODIUM CHLORIDE 0.9 % IV SOLN: Freq: Once | INTRAVENOUS | Status: DC

## 2011-07-19 MED ORDER — ONDANSETRON 8 MG/50ML IVPB (CHCC)
8.0000 mg | Freq: Once | INTRAVENOUS | Status: AC
  Administered 2011-07-19: 8 mg via INTRAVENOUS

## 2011-07-19 MED ORDER — DEXAMETHASONE SODIUM PHOSPHATE 10 MG/ML IJ SOLN: 10.0000 mg | Freq: Once | INTRAMUSCULAR | Status: DC

## 2011-07-19 MED ORDER — SODIUM CHLORIDE 0.9 % IJ SOLN
3.0000 mL | INTRAMUSCULAR | Status: DC | PRN
  Filled 2011-07-19: qty 10

## 2011-07-19 MED ORDER — ALTEPLASE 2 MG IJ SOLR
2.0000 mg | Freq: Once | INTRAMUSCULAR | Status: DC | PRN
  Filled 2011-07-19: qty 2

## 2011-07-19 MED ORDER — HEPARIN SOD (PORK) LOCK FLUSH 100 UNIT/ML IV SOLN
500.0000 [IU] | Freq: Once | INTRAVENOUS | Status: DC | PRN
  Filled 2011-07-19: qty 5

## 2011-07-19 MED ORDER — SODIUM CHLORIDE 0.9 % IJ SOLN
10.0000 mL | INTRAMUSCULAR | Status: DC | PRN
  Filled 2011-07-19: qty 10

## 2011-07-19 NOTE — Patient Instructions (Signed)
Pt left via ambulatory with friend.

## 2011-07-20 ENCOUNTER — Ambulatory Visit (HOSPITAL_BASED_OUTPATIENT_CLINIC_OR_DEPARTMENT_OTHER): Payer: Medicare Other

## 2011-07-20 VITALS — BP 115/73 | HR 110 | Temp 97.8°F

## 2011-07-20 DIAGNOSIS — C3490 Malignant neoplasm of unspecified part of unspecified bronchus or lung: Secondary | ICD-10-CM

## 2011-07-20 DIAGNOSIS — Z5189 Encounter for other specified aftercare: Secondary | ICD-10-CM

## 2011-07-20 MED ORDER — PEGFILGRASTIM INJECTION 6 MG/0.6ML
6.0000 mg | Freq: Once | SUBCUTANEOUS | Status: AC
  Administered 2011-07-20: 6 mg via SUBCUTANEOUS
  Filled 2011-07-20: qty 0.6

## 2011-07-24 ENCOUNTER — Other Ambulatory Visit (HOSPITAL_BASED_OUTPATIENT_CLINIC_OR_DEPARTMENT_OTHER): Payer: Medicare Other

## 2011-07-24 DIAGNOSIS — C3490 Malignant neoplasm of unspecified part of unspecified bronchus or lung: Secondary | ICD-10-CM

## 2011-07-24 LAB — CBC WITH DIFFERENTIAL/PLATELET
Eosinophils Absolute: 0 10*3/uL (ref 0.0–0.5)
HCT: 25.4 % — ABNORMAL LOW (ref 34.8–46.6)
LYMPH%: 6.8 % — ABNORMAL LOW (ref 14.0–49.7)
MCHC: 34.6 g/dL (ref 31.5–36.0)
MONO#: 0.6 10*3/uL (ref 0.1–0.9)
NEUT#: 4.8 10*3/uL (ref 1.5–6.5)
NEUT%: 82.3 % — ABNORMAL HIGH (ref 38.4–76.8)
Platelets: 310 10*3/uL (ref 145–400)
WBC: 5.8 10*3/uL (ref 3.9–10.3)

## 2011-07-24 LAB — COMPREHENSIVE METABOLIC PANEL
BUN: 19 mg/dL (ref 6–23)
CO2: 27 mEq/L (ref 19–32)
Creatinine, Ser: 0.71 mg/dL (ref 0.50–1.10)
Glucose, Bld: 84 mg/dL (ref 70–99)
Total Bilirubin: 0.4 mg/dL (ref 0.3–1.2)

## 2011-07-31 ENCOUNTER — Encounter: Payer: Self-pay | Admitting: *Deleted

## 2011-07-31 ENCOUNTER — Other Ambulatory Visit (HOSPITAL_BASED_OUTPATIENT_CLINIC_OR_DEPARTMENT_OTHER): Payer: Medicare Other | Admitting: Lab

## 2011-07-31 DIAGNOSIS — C3490 Malignant neoplasm of unspecified part of unspecified bronchus or lung: Secondary | ICD-10-CM

## 2011-07-31 LAB — COMPREHENSIVE METABOLIC PANEL
Alkaline Phosphatase: 95 U/L (ref 39–117)
BUN: 17 mg/dL (ref 6–23)
Glucose, Bld: 103 mg/dL — ABNORMAL HIGH (ref 70–99)
Total Bilirubin: 0.3 mg/dL (ref 0.3–1.2)

## 2011-07-31 LAB — CBC WITH DIFFERENTIAL/PLATELET
Eosinophils Absolute: 0 10*3/uL (ref 0.0–0.5)
HCT: 29.4 % — ABNORMAL LOW (ref 34.8–46.6)
LYMPH%: 4.5 % — ABNORMAL LOW (ref 14.0–49.7)
MONO#: 0.9 10*3/uL (ref 0.1–0.9)
NEUT#: 11.3 10*3/uL — ABNORMAL HIGH (ref 1.5–6.5)
NEUT%: 88.7 % — ABNORMAL HIGH (ref 38.4–76.8)
Platelets: 130 10*3/uL — ABNORMAL LOW (ref 145–400)
WBC: 12.7 10*3/uL — ABNORMAL HIGH (ref 3.9–10.3)

## 2011-07-31 NOTE — Progress Notes (Signed)
Spoke with Janice Daniels at [facility] today.  Questions and concerns answered

## 2011-08-02 ENCOUNTER — Ambulatory Visit (HOSPITAL_COMMUNITY)
Admission: RE | Admit: 2011-08-02 | Discharge: 2011-08-02 | Disposition: A | Discharge status: Home / Self Care | Payer: Medicare Other | Source: Ambulatory Visit | Attending: Physician Assistant | Admitting: Physician Assistant

## 2011-08-02 DIAGNOSIS — Z923 Personal history of irradiation: Secondary | ICD-10-CM | POA: Insufficient documentation

## 2011-08-02 DIAGNOSIS — I7090 Unspecified atherosclerosis: Secondary | ICD-10-CM | POA: Insufficient documentation

## 2011-08-02 DIAGNOSIS — J438 Other emphysema: Secondary | ICD-10-CM | POA: Insufficient documentation

## 2011-08-02 DIAGNOSIS — Z9221 Personal history of antineoplastic chemotherapy: Secondary | ICD-10-CM | POA: Insufficient documentation

## 2011-08-02 DIAGNOSIS — C3490 Malignant neoplasm of unspecified part of unspecified bronchus or lung: Secondary | ICD-10-CM

## 2011-08-02 DIAGNOSIS — C3410 Malignant neoplasm of upper lobe, unspecified bronchus or lung: Secondary | ICD-10-CM | POA: Insufficient documentation

## 2011-08-02 MED ORDER — IOHEXOL 300 MG/ML  SOLN
100.0000 mL | Freq: Once | INTRAMUSCULAR | Status: AC | PRN
  Administered 2011-08-02: 100 mL via INTRAVENOUS

## 2011-08-07 ENCOUNTER — Ambulatory Visit (HOSPITAL_BASED_OUTPATIENT_CLINIC_OR_DEPARTMENT_OTHER): Payer: Medicare Other | Admitting: Internal Medicine

## 2011-08-07 ENCOUNTER — Other Ambulatory Visit: Payer: Medicare Other | Admitting: Lab

## 2011-08-07 ENCOUNTER — Telehealth: Payer: Self-pay | Admitting: Internal Medicine

## 2011-08-07 VITALS — BP 139/82 | HR 111 | Temp 97.1°F | Ht 64.0 in | Wt 115.3 lb

## 2011-08-07 DIAGNOSIS — C3490 Malignant neoplasm of unspecified part of unspecified bronchus or lung: Secondary | ICD-10-CM

## 2011-08-07 LAB — COMPREHENSIVE METABOLIC PANEL
ALT: 8 U/L (ref 0–35)
AST: 17 U/L (ref 0–37)
CO2: 26 mEq/L (ref 19–32)
Calcium: 9.4 mg/dL (ref 8.4–10.5)
Chloride: 106 mEq/L (ref 96–112)
Creatinine, Ser: 0.75 mg/dL (ref 0.50–1.10)
Sodium: 140 mEq/L (ref 135–145)
Total Bilirubin: 0.3 mg/dL (ref 0.3–1.2)
Total Protein: 6.6 g/dL (ref 6.0–8.3)

## 2011-08-07 LAB — CBC WITH DIFFERENTIAL/PLATELET
BASO%: 0.1 % (ref 0.0–2.0)
EOS%: 0.1 % (ref 0.0–7.0)
HCT: 30.2 % — ABNORMAL LOW (ref 34.8–46.6)
HGB: 10.1 g/dL — ABNORMAL LOW (ref 11.6–15.9)
MCH: 35.2 pg — ABNORMAL HIGH (ref 25.1–34.0)
MCHC: 33.5 g/dL (ref 31.5–36.0)
MONO#: 0.9 10*3/uL (ref 0.1–0.9)
NEUT#: 5.1 10*3/uL (ref 1.5–6.5)
NEUT%: 79 % — ABNORMAL HIGH (ref 38.4–76.8)
RBC: 2.88 10*6/uL — ABNORMAL LOW (ref 3.70–5.45)
lymph#: 0.4 10*3/uL — ABNORMAL LOW (ref 0.9–3.3)

## 2011-08-07 NOTE — Telephone Encounter (Signed)
Gv pt appt for [id number] 

## 2011-08-07 NOTE — Progress Notes (Signed)
[facility] OFFICE PROGRESS NOTE  Garlan Fillers, MD, MD [address] [place], [place]. 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   PRIOR THERAPY: None.   CURRENT THERAPY: Systemic chemotherapy with carboplatin for AUC of 5 on day 1 and etoposide 120 mg/M2 on days 1,2 and 3 with Neulasta support on day 4, status post 4 cycles now with concurrent with radiotherapy.   INTERVAL HISTORY: Janice Daniels 66 y.o. female returns to the clinic today for followup visit. The patient tolerated the last cycle of her chemotherapy fairly well except for mild fatigue from the chemotherapy-induced anemia. She denied having any significant chest pain or shortness of breath, no cough or hemoptysis. She has no significant weight loss or night sweats. Has no nausea or vomiting, no peripheral neuropathy. The patient also completed a course of concurrent radiotherapy under the care of Dr. Dayton Scrape. She has repeat CT scan of the chest and head performed recently and she is here for evaluation and discussion of her scan results.  MEDICAL HISTORY: Past Medical History  Diagnosis Date  . Hypertension   . Hypothyroid   . GERD (gastroesophageal reflux disease)   . COPD (chronic obstructive pulmonary disease)   . Low back pain   . Lung cancer 05/09/2011    RUL  . Depression     ALLERGIES:  is allergic to codeine.  MEDICATIONS:  Current Outpatient Prescriptions  Medication Sig Dispense Refill  . acetaminophen (TYLENOL) 500 MG tablet Take 500 mg by mouth every 6 (six) hours as needed.        Marland Kitchen albuterol (PROVENTIL) (2.5 MG/3ML) 0.083% nebulizer solution Take 2.5 mg by nebulization every 4 (four) hours as needed.        Marland Kitchen albuterol (VENTOLIN HFA) 108 (90 BASE) MCG/ACT inhaler Inhale 2 puffs into the lungs every 6 (six) hours as needed.        . citalopram (CELEXA) 10 MG tablet Take 10 mg by mouth daily.         Marland Kitchen docusate sodium (COLACE) 100 MG capsule Take 100 mg by mouth 2 (two) times daily. 2 po bid       . Fluticasone-Salmeterol (ADVAIR) 250-50 MCG/DOSE AEPB Inhale 1 puff into the lungs 2 (two) times daily.        Marland Kitchen levothyroxine (SYNTHROID, LEVOTHROID) 112 MCG tablet Take 112 mcg by mouth daily.        . Multiple Vitamin (MULTIVITAMIN) tablet Take 1 tablet by mouth daily.        Marland Kitchen tiotropium (SPIRIVA HANDIHALER) 18 MCG inhalation capsule Place 1 capsule (18 mcg total) into inhaler and inhale daily.  30 capsule  6  . benazepril-hydrochlorthiazide (LOTENSIN HCT) 10-12.5 MG per tablet Take 1 tablet by mouth daily as needed.       . loratadine (CLARITIN) 10 MG tablet Take 10 mg by mouth daily. prn       . methylPREDNISolone (MEDROL DOSEPAK) 4 MG tablet Take by mouth as directed. follow package directions. ON HOLD      . omeprazole (PRILOSEC OTC) 20 MG tablet Take 20 mg by mouth daily.       . potassium chloride SA (K-DUR,KLOR-CON) 20 MEQ tablet TAKE 1 DAILY FOR 7 DAYS  7 tablet  0  . prochlorperazine (COMPAZINE) 10 MG tablet Take 10 mg by mouth every 6 (six) hours as needed.        Marland Kitchen  roflumilast (DALIRESP) 500 MCG TABS tablet Take 500 mcg by mouth daily.          SURGICAL HISTORY:  Past Surgical History  Procedure Date  . Tubal ligation 1984  . Total abdominal hysterectomy 1986  . Tonsillectomy     REVIEW OF SYSTEMS:  A comprehensive review of systems was negative except for: Constitutional: positive for fatigue   PHYSICAL EXAMINATION: General appearance: alert, cooperative and no distress Head: Normocephalic, without obvious abnormality, atraumatic Neck: no adenopathy Lymph nodes: Cervical, supraclavicular, and axillary nodes normal. Resp: clear to auscultation bilaterally Cardio: regular rate and rhythm, S1, S2 normal, no murmur, click, rub or gallop GI: soft, non-tender; bowel sounds normal; no masses,  no organomegaly Extremities: extremities normal, atraumatic, no cyanosis or  edema Neurologic: Alert and oriented X 3, normal strength and tone. Normal symmetric reflexes. Normal coordination and gait  ECOG PERFORMANCE STATUS: 1 - Symptomatic but completely ambulatory  Blood pressure 139/82, pulse 111, temperature 97.1 F (36.2 C), temperature source Oral, height 5\' 4"  (1.626 m), weight 115 lb 4.8 oz (52.3 kg).  LABORATORY DATA: Lab Results  Component Value Date   WBC 6.4 08/07/2011   HGB 10.1* 08/07/2011   HCT 30.2* 08/07/2011   MCV 104.9* 08/07/2011   PLT 444* 08/07/2011      Chemistry      Component Value Date/Time   NA 140 08/07/2011 1054   K 4.4 08/07/2011 1054   CL 106 08/07/2011 1054   CO2 26 08/07/2011 1054   BUN 13 08/07/2011 1054   CREATININE 0.75 08/07/2011 1054      Component Value Date/Time   CALCIUM 9.4 08/07/2011 1054   ALKPHOS 69 08/07/2011 1054   AST 17 08/07/2011 1054   ALT 8 08/07/2011 1054   BILITOT 0.3 08/07/2011 1054       RADIOGRAPHIC STUDIES: Ct Head W Wo Contrast  08/02/2011  *RADIOLOGY REPORT*  Clinical Data: 66 year old female with lung cancer for restaging.  CT HEAD WITHOUT AND WITH CONTRAST  Technique:  Contiguous axial images were obtained from the base of the skull through the vertex without and with intravenous contrast.  Contrast: OMNIPAQUE IOHEXOL 300 MG/ML IV SOLN In conjunction with CT(s) of the chest which is(are) reported separately.  Comparison: Brain MRI 05/16/2011.  Findings: Visualized paranasal sinuses and mastoids are clear.  No suspicious osseous lesion. Visualized orbits and scalp soft tissues are within normal limits.  Mild Calcified atherosclerosis at the skull base.  Mild to moderate cerebral white matter hypodensity similar to the prior exam.  No ventriculomegaly. No midline shift, mass effect, or evidence of mass lesion.  No acute intracranial hemorrhage identified.  No evidence of cortically based acute infarction identified.  No abnormal enhancement identified.  Major intracranial vascular structures are  enhancing.  IMPRESSION:  1. No acute or metastatic intracranial abnormality. 2.  Chest CT reported separately.  Original Report Authenticated By: Harley Hallmark, M.D.   Ct Chest W Contrast  08/02/2011  *RADIOLOGY REPORT*  Clinical Data: History of lung cancer status post chemotherapy and radiation therapy (radiation therapy completed 07/18/2011). Restaging scan.  CT CHEST WITH CONTRAST  Technique:  Multidetector CT imaging of the chest was performed following the standard protocol during bolus administration of intravenous contrast.  Contrast: OMNIPAQUE IOHEXOL 300 MG/ML IV SOLN  Comparison: Chest CT 06/23/2011.  Findings:  Mediastinum: Heart size is normal. There is no significant pericardial fluid, thickening or pericardial calcification.  The mild atherosclerosis of the thoracic aorta and great vessels of  the mediastinum.  Prominent nodal tissue in the right hilar region measuring 1.4 cm in short axis is again noted.  No other pathologically enlarged mediastinal or hilar lymph nodes are noted. Esophagus is normal in appearance.  Lungs/Pleura: When compared to prior examinations, the size of the previously noted right upper lobe nodule continues to decrease, currently measuring 1.7 x 2.4 cm (on axial image 26 of series eight).  There is surrounding interstitial prominence and architectural distortion in the adjacent right lung, compatible with evolving postradiation changes. There are otherwise no new or enlarging suspicious appearing pulmonary nodules or masses identified on today's CT examination.  Areas of bronchial wall thickening and peribronchovascular ground-glass attenuation and micronodularity are noted in the posterior aspect of the lower lobes of the lungs bilaterally, a nonspecific finding, which may suggest sequelae of mild aspiration.  No frank airspace consolidation on today's examination.  No pleural effusions.  Upper Abdomen: Atherosclerosis.  Visualized portions of upper abdomen are  otherwise unremarkable.  Musculoskeletal: There are no aggressive appearing lytic or blastic lesions noted in the visualized portions of the skeleton.  IMPRESSION:  1.  Today's scan again demonstrates positive response to therapy with decreasing size of right upper lobe nodule, stable size of right hilar lymph nodes, and no evidence to suggest new disease in the thorax. 2.  Findings in the lung bases bilaterally are nonspecific and may suggest sequelae of mild aspiration.  Alternatively, if these regions were within the radiation field, these could be evolving postradiation changes as well. 3.  Mild centrilobular emphysema. 4.  Atherosclerosis.  Original Report Authenticated By: Florencia Reasons, M.D.    ASSESSMENT: This is a very pleasant 66 years old white female with limited stage small cell lung cancer status post 4 cycles of systemic chemotherapy was carboplatin and etoposide concurrent with radiotherapy. The patient is doing fine and she has significant improvement in her disease but there is still residual right hilar lymph nodes as well as right upper lobe nodule.   PLAN: I have a lengthy discussion with the patient today about her current disease status and treatment options. I gave him the option of proceeding with 2 more cycles of chemotherapy with the same regimen versus taking a treatment break and followup scan in 2 months. The patient decided to proceed with 2 more cycles of chemotherapy. She will start cycle #5 tomorrow. She would come back for followup visit in 3 weeks with the start of cycle #6. The patient will also be referred to Dr. Dayton Scrape for consideration of prophylactic cranial irradiation after completion of cycle 6.   All questions were answered. The patient knows to call the clinic with any problems, questions or concerns. We can certainly see the patient much sooner if necessary.  I spent 20 minutes counseling the patient face to face. The total time spent in the appointment  was 40 minutes.

## 2011-08-09 ENCOUNTER — Ambulatory Visit (HOSPITAL_BASED_OUTPATIENT_CLINIC_OR_DEPARTMENT_OTHER): Payer: Medicare Other

## 2011-08-09 VITALS — BP 133/81 | HR 112 | Temp 97.2°F

## 2011-08-09 DIAGNOSIS — C3490 Malignant neoplasm of unspecified part of unspecified bronchus or lung: Secondary | ICD-10-CM

## 2011-08-09 DIAGNOSIS — C3410 Malignant neoplasm of upper lobe, unspecified bronchus or lung: Secondary | ICD-10-CM

## 2011-08-09 DIAGNOSIS — Z5111 Encounter for antineoplastic chemotherapy: Secondary | ICD-10-CM

## 2011-08-09 MED ORDER — SODIUM CHLORIDE 0.9 % IV SOLN
337.0000 mg | Freq: Once | INTRAVENOUS | Status: AC
  Administered 2011-08-09: 340 mg via INTRAVENOUS
  Filled 2011-08-09: qty 34

## 2011-08-09 MED ORDER — SODIUM CHLORIDE 0.9 % IV SOLN
Freq: Once | INTRAVENOUS | Status: AC
  Administered 2011-08-09: 14:00:00 via INTRAVENOUS

## 2011-08-09 MED ORDER — ONDANSETRON 16 MG/50ML IVPB (CHCC)
16.0000 mg | Freq: Once | INTRAVENOUS | Status: AC
  Administered 2011-08-09: 16 mg via INTRAVENOUS

## 2011-08-09 MED ORDER — DEXAMETHASONE SODIUM PHOSPHATE 4 MG/ML IJ SOLN
20.0000 mg | Freq: Once | INTRAMUSCULAR | Status: AC
Start: 2011-08-09 — End: 2011-08-09
  Administered 2011-08-09: 20 mg via INTRAVENOUS

## 2011-08-09 MED ORDER — SODIUM CHLORIDE 0.9 % IV SOLN
120.0000 mg/m2 | Freq: Once | INTRAVENOUS | Status: AC
  Administered 2011-08-09: 180 mg via INTRAVENOUS
  Filled 2011-08-09: qty 9

## 2011-08-10 ENCOUNTER — Ambulatory Visit (HOSPITAL_BASED_OUTPATIENT_CLINIC_OR_DEPARTMENT_OTHER): Payer: Medicare Other

## 2011-08-10 VITALS — BP 111/66 | HR 112

## 2011-08-10 DIAGNOSIS — C3490 Malignant neoplasm of unspecified part of unspecified bronchus or lung: Secondary | ICD-10-CM

## 2011-08-10 DIAGNOSIS — Z5111 Encounter for antineoplastic chemotherapy: Secondary | ICD-10-CM

## 2011-08-10 MED ORDER — DEXAMETHASONE SODIUM PHOSPHATE 10 MG/ML IJ SOLN
10.0000 mg | Freq: Once | INTRAMUSCULAR | Status: AC
  Administered 2011-08-10: 10 mg via INTRAVENOUS

## 2011-08-10 MED ORDER — SODIUM CHLORIDE 0.9 % IV SOLN
Freq: Once | INTRAVENOUS | Status: AC
  Administered 2011-08-10: 14:00:00 via INTRAVENOUS

## 2011-08-10 MED ORDER — ONDANSETRON 8 MG/50ML IVPB (CHCC)
8.0000 mg | Freq: Once | INTRAVENOUS | Status: AC
  Administered 2011-08-10: 8 mg via INTRAVENOUS

## 2011-08-10 MED ORDER — SODIUM CHLORIDE 0.9 % IV SOLN
120.0000 mg/m2 | Freq: Once | INTRAVENOUS | Status: AC
  Administered 2011-08-10: 180 mg via INTRAVENOUS
  Filled 2011-08-10: qty 9

## 2011-08-10 NOTE — Patient Instructions (Signed)
Patient aware of next appointment; discharged home with sister; no complaints at discharge; patient has meds for nausea at home.

## 2011-08-11 ENCOUNTER — Ambulatory Visit (HOSPITAL_BASED_OUTPATIENT_CLINIC_OR_DEPARTMENT_OTHER): Payer: Medicare Other

## 2011-08-11 VITALS — BP 123/74 | HR 101 | Temp 97.5°F

## 2011-08-11 DIAGNOSIS — Z5111 Encounter for antineoplastic chemotherapy: Secondary | ICD-10-CM

## 2011-08-11 DIAGNOSIS — C3490 Malignant neoplasm of unspecified part of unspecified bronchus or lung: Secondary | ICD-10-CM

## 2011-08-11 MED ORDER — ONDANSETRON 8 MG/50ML IVPB (CHCC)
8.0000 mg | Freq: Once | INTRAVENOUS | Status: AC
  Administered 2011-08-11: 8 mg via INTRAVENOUS

## 2011-08-11 MED ORDER — SODIUM CHLORIDE 0.9 % IV SOLN
120.0000 mg/m2 | Freq: Once | INTRAVENOUS | Status: AC
  Administered 2011-08-11: 180 mg via INTRAVENOUS
  Filled 2011-08-11: qty 9

## 2011-08-11 MED ORDER — SODIUM CHLORIDE 0.9 % IV SOLN
Freq: Once | INTRAVENOUS | Status: AC
  Administered 2011-08-11: 13:00:00 via INTRAVENOUS

## 2011-08-11 MED ORDER — DEXAMETHASONE SODIUM PHOSPHATE 10 MG/ML IJ SOLN
10.0000 mg | Freq: Once | INTRAMUSCULAR | Status: AC
  Administered 2011-08-11: 10 mg via INTRAVENOUS

## 2011-08-11 NOTE — Patient Instructions (Signed)
Pt to return tomorrow at 1230 for injection appt. Sister aware too.

## 2011-08-12 ENCOUNTER — Ambulatory Visit (HOSPITAL_BASED_OUTPATIENT_CLINIC_OR_DEPARTMENT_OTHER): Payer: Medicare Other

## 2011-08-12 ENCOUNTER — Ambulatory Visit: Payer: Medicare Other

## 2011-08-12 VITALS — BP 145/75 | HR 106 | Temp 97.6°F

## 2011-08-12 DIAGNOSIS — C3490 Malignant neoplasm of unspecified part of unspecified bronchus or lung: Secondary | ICD-10-CM

## 2011-08-12 MED ORDER — PEGFILGRASTIM INJECTION 6 MG/0.6ML
6.0000 mg | Freq: Once | SUBCUTANEOUS | Status: AC
  Administered 2011-08-12: 6 mg via SUBCUTANEOUS

## 2011-08-14 ENCOUNTER — Other Ambulatory Visit (HOSPITAL_BASED_OUTPATIENT_CLINIC_OR_DEPARTMENT_OTHER): Payer: Medicare Other | Admitting: Lab

## 2011-08-14 DIAGNOSIS — C3490 Malignant neoplasm of unspecified part of unspecified bronchus or lung: Secondary | ICD-10-CM

## 2011-08-14 LAB — CBC WITH DIFFERENTIAL/PLATELET
BASO%: 0 % (ref 0.0–2.0)
Eosinophils Absolute: 0 10*3/uL (ref 0.0–0.5)
LYMPH%: 1.2 % — ABNORMAL LOW (ref 14.0–49.7)
MCHC: 33.7 g/dL (ref 31.5–36.0)
MCV: 107.9 fL — ABNORMAL HIGH (ref 79.5–101.0)
MONO%: 0.1 % (ref 0.0–14.0)
NEUT#: 41.5 10*3/uL — ABNORMAL HIGH (ref 1.5–6.5)
RBC: 2.73 10*6/uL — ABNORMAL LOW (ref 3.70–5.45)
RDW: 20.7 % — ABNORMAL HIGH (ref 11.2–14.5)
WBC: 42 10*3/uL — ABNORMAL HIGH (ref 3.9–10.3)

## 2011-08-14 LAB — COMPREHENSIVE METABOLIC PANEL
ALT: 13 U/L (ref 0–35)
AST: 15 U/L (ref 0–37)
Albumin: 4.1 g/dL (ref 3.5–5.2)
Alkaline Phosphatase: 88 U/L (ref 39–117)
Glucose, Bld: 86 mg/dL (ref 70–99)
Potassium: 4.2 mEq/L (ref 3.5–5.3)
Sodium: 139 mEq/L (ref 135–145)
Total Bilirubin: 0.7 mg/dL (ref 0.3–1.2)
Total Protein: 6.3 g/dL (ref 6.0–8.3)

## 2011-08-22 ENCOUNTER — Encounter: Payer: Self-pay | Admitting: Radiation Oncology

## 2011-08-22 ENCOUNTER — Ambulatory Visit
Admission: RE | Admit: 2011-08-22 | Discharge: 2011-08-22 | Disposition: A | Discharge status: Home / Self Care | Payer: Medicare Other | Source: Ambulatory Visit | Attending: Radiation Oncology | Admitting: Radiation Oncology

## 2011-08-22 ENCOUNTER — Other Ambulatory Visit (HOSPITAL_BASED_OUTPATIENT_CLINIC_OR_DEPARTMENT_OTHER): Payer: Medicare Other | Admitting: Lab

## 2011-08-22 VITALS — BP 111/71 | HR 89 | Temp 98.1°F | Resp 18 | Wt 115.6 lb

## 2011-08-22 DIAGNOSIS — C3490 Malignant neoplasm of unspecified part of unspecified bronchus or lung: Secondary | ICD-10-CM

## 2011-08-22 LAB — CBC WITH DIFFERENTIAL/PLATELET
BASO%: 0.1 % (ref 0.0–2.0)
EOS%: 0.4 % (ref 0.0–7.0)
Eosinophils Absolute: 0 10*3/uL (ref 0.0–0.5)
LYMPH%: 5.2 % — ABNORMAL LOW (ref 14.0–49.7)
MCH: 36.4 pg — ABNORMAL HIGH (ref 25.1–34.0)
MCHC: 33.2 g/dL (ref 31.5–36.0)
MCV: 109.9 fL — ABNORMAL HIGH (ref 79.5–101.0)
MONO%: 8.1 % (ref 0.0–14.0)
NEUT#: 8.4 10*3/uL — ABNORMAL HIGH (ref 1.5–6.5)
Platelets: 176 10*3/uL (ref 145–400)
RBC: 2.87 10*6/uL — ABNORMAL LOW (ref 3.70–5.45)
RDW: 20.6 % — ABNORMAL HIGH (ref 11.2–14.5)

## 2011-08-22 LAB — COMPREHENSIVE METABOLIC PANEL
AST: 17 U/L (ref 0–37)
Albumin: 4.2 g/dL (ref 3.5–5.2)
Alkaline Phosphatase: 114 U/L (ref 39–117)
Glucose, Bld: 97 mg/dL (ref 70–99)
Potassium: 4 mEq/L (ref 3.5–5.3)
Sodium: 141 mEq/L (ref 135–145)
Total Bilirubin: 0.2 mg/dL — ABNORMAL LOW (ref 0.3–1.2)
Total Protein: 6.6 g/dL (ref 6.0–8.3)

## 2011-08-22 NOTE — Progress Notes (Signed)
Patient presents to the clinic today for follow up appointment with Dr. Dayton Scrape to discuss prophylactic cranial irradiation. Patient is unaccompanied. Patient reports she has a lot of questions for Dr. Dayton Scrape reference this matter. Patient is alert and oriented to person, place, and time. No distress noted. Steady gait noted. Pleasant affect noted. Patient denies pain at this time. Patient reports an occasional cough with clear sputum. Patient reports an occasional sinus headache relieved with Tylenol. Patient reports that she is doing so well Dr. Gwenyth Bouillon decided to move forward with two more sessions of chemo; one she has already completed and the next scheduled for March 5,6,7. Patient denies nausea, vomiting, diarrhea, or shortness of breath. Reported all findings to Dr. Dayton Scrape.

## 2011-08-22 NOTE — Progress Notes (Signed)
Followup note:  Diagnosis: Limited stage small cell carcinoma of the right lung  The patient visits today for a followup visit, now approximately 1 month following completion of radiation therapy to her right lung/mediastinum in the management of her limited stage small cell carcinoma the right lung. She is without complaints today. Dr. Arbutus Ped plans on to more sessions of chemotherapy which is expected to be completed in early March. She underwent restaging with a CT scan of her chest showing a positive response to therapy with decreasing size of right upper lobe nodule and stable size of right hilar lymph nodes with no evidence to suggest new disease within the thorax. A CT of the brain on 08/02/2011 was without evidence for metastatic disease. She denies headaches, nausea, vomiting, or change in vision.  Physical examination: Alert and oriented. Head and neck examination there is slight regrowth of hair. Nodes: Without palpable cervical or supraclavicular lymphadenopathy. Chest: Lungs clear. Neurologic examination: Grossly nonfocal.  Impression:  Limited stage small cell carcinoma of the lung. She is a candidate for prophylactic cranial radiation following completion of chemotherapy. I discussed the potential acute and late toxicities of whole brain radiation and she wishes to proceed as soon as possible after completion of chemotherapy. Consent is signed today. I'll have her return for simulation/treatment planning in mid March.

## 2011-08-27 ENCOUNTER — Other Ambulatory Visit: Payer: Self-pay | Admitting: Internal Medicine

## 2011-08-29 ENCOUNTER — Ambulatory Visit (HOSPITAL_BASED_OUTPATIENT_CLINIC_OR_DEPARTMENT_OTHER): Payer: Medicare Other

## 2011-08-29 ENCOUNTER — Other Ambulatory Visit (HOSPITAL_BASED_OUTPATIENT_CLINIC_OR_DEPARTMENT_OTHER): Payer: Medicare Other | Admitting: Lab

## 2011-08-29 ENCOUNTER — Ambulatory Visit (HOSPITAL_BASED_OUTPATIENT_CLINIC_OR_DEPARTMENT_OTHER): Payer: Medicare Other | Admitting: Physician Assistant

## 2011-08-29 ENCOUNTER — Encounter: Payer: Self-pay | Admitting: Physician Assistant

## 2011-08-29 ENCOUNTER — Telehealth: Payer: Self-pay | Admitting: Internal Medicine

## 2011-08-29 VITALS — BP 120/66 | HR 94 | Temp 97.2°F | Ht 64.0 in | Wt 118.6 lb

## 2011-08-29 DIAGNOSIS — C3490 Malignant neoplasm of unspecified part of unspecified bronchus or lung: Secondary | ICD-10-CM

## 2011-08-29 DIAGNOSIS — Z5111 Encounter for antineoplastic chemotherapy: Secondary | ICD-10-CM

## 2011-08-29 LAB — CBC WITH DIFFERENTIAL/PLATELET
Basophils Absolute: 0 10*3/uL (ref 0.0–0.1)
EOS%: 0.5 % (ref 0.0–7.0)
Eosinophils Absolute: 0 10*3/uL (ref 0.0–0.5)
LYMPH%: 19.8 % (ref 14.0–49.7)
MCH: 35.3 pg — ABNORMAL HIGH (ref 25.1–34.0)
MCV: 106.6 fL — ABNORMAL HIGH (ref 79.5–101.0)
MONO%: 6.9 % (ref 0.0–14.0)
NEUT#: 4.8 10*3/uL (ref 1.5–6.5)
Platelets: 323 10*3/uL (ref 145–400)
RBC: 3.03 10*6/uL — ABNORMAL LOW (ref 3.70–5.45)
nRBC: 0 % (ref 0–0)

## 2011-08-29 LAB — COMPREHENSIVE METABOLIC PANEL
Albumin: 4.7 g/dL (ref 3.5–5.2)
CO2: 24 mEq/L (ref 19–32)
Glucose, Bld: 124 mg/dL — ABNORMAL HIGH (ref 70–99)
Potassium: 4.1 mEq/L (ref 3.5–5.3)
Sodium: 139 mEq/L (ref 135–145)
Total Protein: 7.2 g/dL (ref 6.0–8.3)

## 2011-08-29 MED ORDER — SODIUM CHLORIDE 0.9 % IV SOLN
120.0000 mg/m2 | Freq: Once | INTRAVENOUS | Status: AC
  Administered 2011-08-29: 180 mg via INTRAVENOUS
  Filled 2011-08-29: qty 9

## 2011-08-29 MED ORDER — SODIUM CHLORIDE 0.9 % IV SOLN
Freq: Once | INTRAVENOUS | Status: AC
  Administered 2011-08-29: 12:00:00 via INTRAVENOUS

## 2011-08-29 MED ORDER — ONDANSETRON 16 MG/50ML IVPB (CHCC)
16.0000 mg | Freq: Once | INTRAVENOUS | Status: AC
  Administered 2011-08-29: 16 mg via INTRAVENOUS

## 2011-08-29 MED ORDER — DEXAMETHASONE SODIUM PHOSPHATE 4 MG/ML IJ SOLN
20.0000 mg | Freq: Once | INTRAMUSCULAR | Status: AC
  Administered 2011-08-29: 20 mg via INTRAVENOUS

## 2011-08-29 MED ORDER — SODIUM CHLORIDE 0.9 % IV SOLN
390.5000 mg | Freq: Once | INTRAVENOUS | Status: AC
  Administered 2011-08-29: 390 mg via INTRAVENOUS
  Filled 2011-08-29: qty 39

## 2011-08-29 NOTE — Telephone Encounter (Signed)
Gv pt appt for march2013.  scheduled pt for ct scan on 03/22 @ [facility] °

## 2011-08-30 ENCOUNTER — Ambulatory Visit (HOSPITAL_BASED_OUTPATIENT_CLINIC_OR_DEPARTMENT_OTHER): Payer: Medicare Other

## 2011-08-30 VITALS — BP 124/71 | HR 117 | Temp 97.3°F

## 2011-08-30 DIAGNOSIS — Z5111 Encounter for antineoplastic chemotherapy: Secondary | ICD-10-CM

## 2011-08-30 DIAGNOSIS — C3490 Malignant neoplasm of unspecified part of unspecified bronchus or lung: Secondary | ICD-10-CM

## 2011-08-30 MED ORDER — SODIUM CHLORIDE 0.9 % IV SOLN
120.0000 mg/m2 | Freq: Once | INTRAVENOUS | Status: AC
  Administered 2011-08-30: 180 mg via INTRAVENOUS
  Filled 2011-08-30: qty 9

## 2011-08-30 MED ORDER — SODIUM CHLORIDE 0.9 % IV SOLN
Freq: Once | INTRAVENOUS | Status: AC
  Administered 2011-08-30: 13:00:00 via INTRAVENOUS

## 2011-08-30 MED ORDER — DEXAMETHASONE SODIUM PHOSPHATE 10 MG/ML IJ SOLN
10.0000 mg | Freq: Once | INTRAMUSCULAR | Status: AC
  Administered 2011-08-30: 10 mg via INTRAVENOUS

## 2011-08-30 MED ORDER — ONDANSETRON 8 MG/50ML IVPB (CHCC)
8.0000 mg | Freq: Once | INTRAVENOUS | Status: AC
  Administered 2011-08-30: 8 mg via INTRAVENOUS

## 2011-08-31 ENCOUNTER — Ambulatory Visit (HOSPITAL_BASED_OUTPATIENT_CLINIC_OR_DEPARTMENT_OTHER): Payer: Medicare Other

## 2011-08-31 ENCOUNTER — Encounter: Payer: Self-pay | Admitting: *Deleted

## 2011-08-31 ENCOUNTER — Encounter: Payer: Self-pay | Admitting: Internal Medicine

## 2011-08-31 DIAGNOSIS — Z5111 Encounter for antineoplastic chemotherapy: Secondary | ICD-10-CM

## 2011-08-31 DIAGNOSIS — C3490 Malignant neoplasm of unspecified part of unspecified bronchus or lung: Secondary | ICD-10-CM

## 2011-08-31 MED ORDER — DEXAMETHASONE SODIUM PHOSPHATE 10 MG/ML IJ SOLN
10.0000 mg | Freq: Once | INTRAMUSCULAR | Status: AC
  Administered 2011-08-31: 10 mg via INTRAVENOUS

## 2011-08-31 MED ORDER — SODIUM CHLORIDE 0.9 % IV SOLN
Freq: Once | INTRAVENOUS | Status: AC
  Administered 2011-08-31: 11:00:00 via INTRAVENOUS

## 2011-08-31 MED ORDER — ONDANSETRON 8 MG/50ML IVPB (CHCC)
8.0000 mg | Freq: Once | INTRAVENOUS | Status: AC
  Administered 2011-08-31: 8 mg via INTRAVENOUS

## 2011-08-31 MED ORDER — SODIUM CHLORIDE 0.9 % IV SOLN
120.0000 mg/m2 | Freq: Once | INTRAVENOUS | Status: AC
  Administered 2011-08-31: 180 mg via INTRAVENOUS
  Filled 2011-08-31: qty 9

## 2011-08-31 NOTE — Progress Notes (Signed)
[facility] OFFICE PROGRESS NOTE  Garlan Fillers, MD, MD [address] [facility], [place]. 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   PRIOR THERAPY: Status post palliative radiotherapy to the right hilum under the care Dr. Dayton Scrape  CURRENT THERAPY: Systemic chemotherapy with carboplatin for AUC of 5 on day 1 and etoposide 120 mg/M2 on days 1,2 and 3 with Neulasta support on day 4, status post 5 cycles   INTERVAL HISTORY: Janice Daniels 66 y.o. female returns to the clinic today for followup visit. Overall she's tolerating her chemotherapy relatively well. She does complain of some low back/sacral pain related to an old injury. She notes some numbness and stiffness in her right hand and fingers and also reports that she has been doing a lot of crocheting She denied having any significant chest pain or shortness of breath, no cough or hemoptysis. She has no significant weight loss or night sweats. Has no nausea or vomiting, no peripheral neuropathy. She reports she is to have her mask made to proceed with her prophylactic cranial radiation on 09/06/2011.  MEDICAL HISTORY: Past Medical History  Diagnosis Date  . Hypertension   . Hypothyroid   . GERD (gastroesophageal reflux disease)   . COPD (chronic obstructive pulmonary disease)   . Low back pain   . Lung cancer 05/09/2011    RUL  . Depression     ALLERGIES:  is allergic to codeine.  MEDICATIONS:  Current Outpatient Prescriptions  Medication Sig Dispense Refill  . acetaminophen (TYLENOL) 500 MG tablet Take 500 mg by mouth every 6 (six) hours as needed.        Marland Kitchen albuterol (PROVENTIL) (2.5 MG/3ML) 0.083% nebulizer solution Take 2.5 mg by nebulization every 4 (four) hours as needed.        Marland Kitchen albuterol (VENTOLIN HFA) 108 (90 BASE) MCG/ACT inhaler Inhale 2 puffs into the lungs every 6 (six) hours as needed.        .  benazepril-hydrochlorthiazide (LOTENSIN HCT) 10-12.5 MG per tablet Take 1 tablet by mouth daily as needed.       . citalopram (CELEXA) 10 MG tablet Take 10 mg by mouth daily.        Marland Kitchen docusate sodium (COLACE) 100 MG capsule Take 100 mg by mouth 2 (two) times daily. 2 po bid       . Fluticasone-Salmeterol (ADVAIR) 250-50 MCG/DOSE AEPB Inhale 1 puff into the lungs 2 (two) times daily.        Marland Kitchen levothyroxine (SYNTHROID, LEVOTHROID) 112 MCG tablet Take 112 mcg by mouth daily.        Marland Kitchen loratadine (CLARITIN) 10 MG tablet Take 10 mg by mouth daily. prn       . methylPREDNISolone (MEDROL DOSEPAK) 4 MG tablet Take by mouth as directed. follow package directions. ON HOLD      . Multiple Vitamin (MULTIVITAMIN) tablet Take 1 tablet by mouth daily.        Marland Kitchen omeprazole (PRILOSEC OTC) 20 MG tablet Take 20 mg by mouth daily.       . potassium chloride SA (K-DUR,KLOR-CON) 20 MEQ tablet TAKE 1 DAILY FOR 7 DAYS  7 tablet  0  . prochlorperazine (COMPAZINE) 10 MG tablet Take 10 mg by mouth every 6 (six) hours as needed.        . roflumilast (DALIRESP) 500 MCG TABS tablet Take 500 mcg by mouth daily.        Marland Kitchen  tiotropium (SPIRIVA HANDIHALER) 18 MCG inhalation capsule Place 1 capsule (18 mcg total) into inhaler and inhale daily.  30 capsule  6   No current facility-administered medications for this visit.   Facility-Administered Medications Ordered in Other Visits  Medication Dose Route Frequency Provider Last Rate Last Dose  . 0.9 %  sodium chloride infusion   Intravenous Once Mohamed K. Mohamed, MD      . dexamethasone (DECADRON) injection 10 mg  10 mg Intravenous Once Mohamed K. Mohamed, MD   10 mg at 08/30/11 1237  . etoposide (VEPESID) 180 mg in sodium chloride 0.9 % 500 mL chemo infusion  120 mg/m2 (Treatment Plan Actual) Intravenous Once Mohamed K. Mohamed, MD   180 mg at 08/30/11 1308  . ondansetron (ZOFRAN) IVPB 8 mg  8 mg Intravenous Once Mohamed K. Arbutus Ped, MD   8 mg at 08/30/11 1237    SURGICAL HISTORY:    Past Surgical History  Procedure Date  . Tubal ligation 1984  . Total abdominal hysterectomy 1986  . Tonsillectomy     REVIEW OF SYSTEMS:  A comprehensive review of systems was negative except for: Musculoskeletal: positive for back pain and stiff joints   PHYSICAL EXAMINATION: General appearance: alert, cooperative and no distress Head: Normocephalic, without obvious abnormality, atraumatic Neck: no adenopathy Lymph nodes: Cervical, supraclavicular, and axillary nodes normal. Resp: clear to auscultation bilaterally Cardio: regular rate and rhythm, S1, S2 normal, no murmur, click, rub or gallop GI: soft, non-tender; bowel sounds normal; no masses,  no organomegaly Extremities: extremities normal, atraumatic, no cyanosis or edema Neurologic: Alert and oriented X 3, normal strength and tone. Normal symmetric reflexes. Normal coordination and gait  ECOG PERFORMANCE STATUS: 1 - Symptomatic but completely ambulatory  Blood pressure 120/66, pulse 94, temperature 97.2 F (36.2 C), temperature source Oral, height 5\' 4"  (1.626 m), weight 118 lb 9.6 oz (53.797 kg).  LABORATORY DATA: Lab Results  Component Value Date   WBC 6.7 08/29/2011   HGB 10.7* 08/29/2011   HCT 32.3* 08/29/2011   MCV 106.6* 08/29/2011   PLT 323 08/29/2011      Chemistry      Component Value Date/Time   NA 139 08/29/2011 0951   K 4.1 08/29/2011 0951   CL 105 08/29/2011 0951   CO2 24 08/29/2011 0951   BUN 13 08/29/2011 0951   CREATININE 0.79 08/29/2011 0951      Component Value Date/Time   CALCIUM 9.6 08/29/2011 0951   ALKPHOS 87 08/29/2011 0951   AST 16 08/29/2011 0951   ALT 11 08/29/2011 0951   BILITOT 0.4 08/29/2011 0951       RADIOGRAPHIC STUDIES: Ct Head W Wo Contrast  08/02/2011  *RADIOLOGY REPORT*  Clinical Data: 67 year old female with lung cancer for restaging.  CT HEAD WITHOUT AND WITH CONTRAST  Technique:  Contiguous axial images were obtained from the base of the skull through the vertex without and with intravenous  contrast.  Contrast: OMNIPAQUE IOHEXOL 300 MG/ML IV SOLN In conjunction with CT(s) of the chest which is(are) reported separately.  Comparison: Brain MRI 05/16/2011.  Findings: Visualized paranasal sinuses and mastoids are clear.  No suspicious osseous lesion. Visualized orbits and scalp soft tissues are within normal limits.  Mild Calcified atherosclerosis at the skull base.  Mild to moderate cerebral white matter hypodensity similar to the prior exam.  No ventriculomegaly. No midline shift, mass effect, or evidence of mass lesion.  No acute intracranial hemorrhage identified.  No evidence of cortically based acute infarction identified.  No abnormal enhancement identified.  Major intracranial vascular structures are enhancing.  IMPRESSION:  1. No acute or metastatic intracranial abnormality. 2.  Chest CT reported separately.  Original Report Authenticated By: Harley Hallmark, M.D.   Ct Chest W Contrast  08/02/2011  *RADIOLOGY REPORT*  Clinical Data: History of lung cancer status post chemotherapy and radiation therapy (radiation therapy completed 07/18/2011). Restaging scan.  CT CHEST WITH CONTRAST  Technique:  Multidetector CT imaging of the chest was performed following the standard protocol during bolus administration of intravenous contrast.  Contrast: OMNIPAQUE IOHEXOL 300 MG/ML IV SOLN  Comparison: Chest CT 06/23/2011.  Findings:  Mediastinum: Heart size is normal. There is no significant pericardial fluid, thickening or pericardial calcification.  The mild atherosclerosis of the thoracic aorta and great vessels of the mediastinum.  Prominent nodal tissue in the right hilar region measuring 1.4 cm in short axis is again noted.  No other pathologically enlarged mediastinal or hilar lymph nodes are noted. Esophagus is normal in appearance.  Lungs/Pleura: When compared to prior examinations, the size of the previously noted right upper lobe nodule continues to decrease, currently measuring 1.7 x  2.4 cm (on axial image 26 of series eight).  There is surrounding interstitial prominence and architectural distortion in the adjacent right lung, compatible with evolving postradiation changes. There are otherwise no new or enlarging suspicious appearing pulmonary nodules or masses identified on today's CT examination.  Areas of bronchial wall thickening and peribronchovascular ground-glass attenuation and micronodularity are noted in the posterior aspect of the lower lobes of the lungs bilaterally, a nonspecific finding, which may suggest sequelae of mild aspiration.  No frank airspace consolidation on today's examination.  No pleural effusions.  Upper Abdomen: Atherosclerosis.  Visualized portions of upper abdomen are otherwise unremarkable.  Musculoskeletal: There are no aggressive appearing lytic or blastic lesions noted in the visualized portions of the skeleton.  IMPRESSION:  1.  Today's scan again demonstrates positive response to therapy with decreasing size of right upper lobe nodule, stable size of right hilar lymph nodes, and no evidence to suggest new disease in the thorax. 2.  Findings in the lung bases bilaterally are nonspecific and may suggest sequelae of mild aspiration.  Alternatively, if these regions were within the radiation field, these could be evolving postradiation changes as well. 3.  Mild centrilobular emphysema. 4.  Atherosclerosis.  Original Report Authenticated By: Florencia Reasons, M.D.    ASSESSMENT/PLAN: This is a very pleasant 66 years old white female with limited stage small cell lung cancer status post 5 cycles of systemic chemotherapy with carboplatin and etoposide concurrent with radiotherapy. The patient is doing fine and she has significant improvement in her disease but there is still residual right hilar lymph nodes as well as right upper lobe nodule. Patient was discussed with Dr.Livesay in Dr. Asa Lente absence. She'll proceed with cycle #6 of her systemic  chemotherapy with carboplatin and etoposide with Neulasta support. She'll followup with Dr. Arbutus Ped in 3 weeks with a CBC differential, C. met and CT of the chest with contrast to reevaluate her disease. She'll proceed with whole brain irradiation the care Dr. Dayton Scrape as scheduled.   Laural Benes, Ellorie Kindall E, PA-C   All questions were answered. The patient knows to call the clinic with any problems, questions or concerns. We can certainly see the patient much sooner if necessary.

## 2011-08-31 NOTE — Patient Instructions (Signed)
[facility] Discharge Instructions for Patients Receiving Chemotherapy  Today you received the following chemotherapy agents  ETOPISIDE  To help prevent nausea and vomiting after your treatment, we encourage you to take your nausea medication  Begin taking it at  as often as prescribed.   If you develop nausea and vomiting that is not controlled by your nausea medication, call the clinic. If it is after clinic hours your family physician or the after hours number for the clinic or go to the Emergency Department.   BELOW ARE SYMPTOMS THAT SHOULD BE REPORTED IMMEDIATELY:  *FEVER GREATER THAN 100.5 F  *CHILLS WITH OR WITHOUT FEVER  NAUSEA AND VOMITING THAT IS NOT CONTROLLED WITH YOUR NAUSEA MEDICATION  *UNUSUAL SHORTNESS OF BREATH  *UNUSUAL BRUISING OR BLEEDING  TENDERNESS IN MOUTH AND THROAT WITH OR WITHOUT PRESENCE OF ULCERS  *URINARY PROBLEMS  *BOWEL PROBLEMS  UNUSUAL RASH Items with * indicate a potential emergency and should be followed up as soon as possible.  One of the nurses will contact you 24 hours after your treatment. Please let the nurse know about any problems that you may have experienced. Feel free to call the clinic you have any questions or concerns. The clinic phone number is [phone].   I have been informed and understand all the instructions given to me. I know to contact the clinic, my physician, or go to the Emergency Department if any problems should occur. I do not have any questions at this time, but understand that I may call the clinic during office hours   should I have any questions or need assistance in obtaining follow up care.    __________________________________________  _____________  __________ Signature of Patient or Authorized Representative            Date                   Time    __________________________________________ Nurse's Signature

## 2011-08-31 NOTE — Progress Notes (Signed)
Spoke with pt in chemo.  We discussed her upcoming scans and her anxiety related to that.  I explained the scan and the reason for getting.  She was less anxious after I left.

## 2011-08-31 NOTE — Progress Notes (Signed)
08/31/2011  Good Morning Dr.Mohamed, I received in my workqueue a request for a chest ct scan for this patient.  i attempted to place this request on [employer] website.  The request went to medical review because Janice Daniels had a chest ct scan on Feb. 6, 2013.  Would you like for me to resubmit this chest ct scan or cancel this appointment for September 15, 2011?  Thank you,  Lilyan Punt [phone]

## 2011-09-01 ENCOUNTER — Ambulatory Visit (HOSPITAL_BASED_OUTPATIENT_CLINIC_OR_DEPARTMENT_OTHER): Payer: Medicare Other

## 2011-09-01 VITALS — BP 126/73 | HR 89 | Temp 97.4°F

## 2011-09-01 DIAGNOSIS — Z5189 Encounter for other specified aftercare: Secondary | ICD-10-CM

## 2011-09-01 DIAGNOSIS — C3490 Malignant neoplasm of unspecified part of unspecified bronchus or lung: Secondary | ICD-10-CM

## 2011-09-01 MED ORDER — PEGFILGRASTIM INJECTION 6 MG/0.6ML
6.0000 mg | Freq: Once | SUBCUTANEOUS | Status: AC
  Administered 2011-09-01: 6 mg via SUBCUTANEOUS

## 2011-09-01 NOTE — Progress Notes (Signed)
Resubmit it. She needs scan 6 weeks from the previous one to evaluate her treatment response.

## 2011-09-05 ENCOUNTER — Other Ambulatory Visit (HOSPITAL_BASED_OUTPATIENT_CLINIC_OR_DEPARTMENT_OTHER): Payer: Medicare Other

## 2011-09-05 DIAGNOSIS — C3490 Malignant neoplasm of unspecified part of unspecified bronchus or lung: Secondary | ICD-10-CM

## 2011-09-05 LAB — CBC WITH DIFFERENTIAL/PLATELET
BASO%: 0.3 % (ref 0.0–2.0)
Eosinophils Absolute: 0 10*3/uL (ref 0.0–0.5)
LYMPH%: 3.2 % — ABNORMAL LOW (ref 14.0–49.7)
MCHC: 33.4 g/dL (ref 31.5–36.0)
MONO#: 0.4 10*3/uL (ref 0.1–0.9)
MONO%: 4.2 % (ref 0.0–14.0)
NEUT#: 8.7 10*3/uL — ABNORMAL HIGH (ref 1.5–6.5)
RBC: 2.54 10*6/uL — ABNORMAL LOW (ref 3.70–5.45)
RDW: 15.9 % — ABNORMAL HIGH (ref 11.2–14.5)
WBC: 9.5 10*3/uL (ref 3.9–10.3)

## 2011-09-05 LAB — COMPREHENSIVE METABOLIC PANEL WITH GFR
ALT: 15 U/L (ref 0–35)
AST: 9 U/L (ref 0–37)
Albumin: 4.1 g/dL (ref 3.5–5.2)
Alkaline Phosphatase: 113 U/L (ref 39–117)
BUN: 15 mg/dL (ref 6–23)
CO2: 20 meq/L (ref 19–32)
Calcium: 9.2 mg/dL (ref 8.4–10.5)
Chloride: 105 meq/L (ref 96–112)
Creatinine, Ser: 0.73 mg/dL (ref 0.50–1.10)
Glucose, Bld: 118 mg/dL — ABNORMAL HIGH (ref 70–99)
Potassium: 3.7 meq/L (ref 3.5–5.3)
Sodium: 137 meq/L (ref 135–145)
Total Bilirubin: 0.5 mg/dL (ref 0.3–1.2)
Total Protein: 6.4 g/dL (ref 6.0–8.3)

## 2011-09-06 ENCOUNTER — Ambulatory Visit
Admission: RE | Admit: 2011-09-06 | Discharge: 2011-09-06 | Disposition: A | Discharge status: Home / Self Care | Payer: Medicare Other | Source: Ambulatory Visit | Attending: Radiation Oncology | Admitting: Radiation Oncology

## 2011-09-06 DIAGNOSIS — C3410 Malignant neoplasm of upper lobe, unspecified bronchus or lung: Secondary | ICD-10-CM | POA: Insufficient documentation

## 2011-09-06 DIAGNOSIS — Y842 Radiological procedure and radiotherapy as the cause of abnormal reaction of the patient, or of later complication, without mention of misadventure at the time of the procedure: Secondary | ICD-10-CM | POA: Insufficient documentation

## 2011-09-06 DIAGNOSIS — C3490 Malignant neoplasm of unspecified part of unspecified bronchus or lung: Secondary | ICD-10-CM

## 2011-09-06 DIAGNOSIS — Z510 Encounter for antineoplastic radiation therapy: Secondary | ICD-10-CM | POA: Insufficient documentation

## 2011-09-06 DIAGNOSIS — K219 Gastro-esophageal reflux disease without esophagitis: Secondary | ICD-10-CM | POA: Insufficient documentation

## 2011-09-06 DIAGNOSIS — Z800 Family history of malignant neoplasm of digestive organs: Secondary | ICD-10-CM | POA: Insufficient documentation

## 2011-09-06 DIAGNOSIS — Z87891 Personal history of nicotine dependence: Secondary | ICD-10-CM | POA: Insufficient documentation

## 2011-09-06 DIAGNOSIS — K1370 Unspecified lesions of oral mucosa: Secondary | ICD-10-CM | POA: Insufficient documentation

## 2011-09-06 DIAGNOSIS — E039 Hypothyroidism, unspecified: Secondary | ICD-10-CM | POA: Insufficient documentation

## 2011-09-06 DIAGNOSIS — R599 Enlarged lymph nodes, unspecified: Secondary | ICD-10-CM | POA: Insufficient documentation

## 2011-09-06 DIAGNOSIS — J4489 Other specified chronic obstructive pulmonary disease: Secondary | ICD-10-CM | POA: Insufficient documentation

## 2011-09-06 DIAGNOSIS — J449 Chronic obstructive pulmonary disease, unspecified: Secondary | ICD-10-CM | POA: Insufficient documentation

## 2011-09-06 DIAGNOSIS — I10 Essential (primary) hypertension: Secondary | ICD-10-CM | POA: Insufficient documentation

## 2011-09-06 DIAGNOSIS — L589 Radiodermatitis, unspecified: Secondary | ICD-10-CM | POA: Insufficient documentation

## 2011-09-06 DIAGNOSIS — L988 Other specified disorders of the skin and subcutaneous tissue: Secondary | ICD-10-CM | POA: Insufficient documentation

## 2011-09-06 DIAGNOSIS — Z8049 Family history of malignant neoplasm of other genital organs: Secondary | ICD-10-CM | POA: Insufficient documentation

## 2011-09-06 DIAGNOSIS — R1310 Dysphagia, unspecified: Secondary | ICD-10-CM | POA: Insufficient documentation

## 2011-09-06 NOTE — Progress Notes (Signed)
Simulation/treatment planning note:  The patient was taken to the CT simulator. A custom head cast was constructed for immobilization. She was then scanned. Her eyes were contoured. She was set up to right and left lateral fields. 2 separate and unique multileaf collimators were designed to conform the field. I prescribing 2500 cGy in 10 sessions utilizing 6 MV photons. Dosimetry and an isodose plan are requested.

## 2011-09-06 NOTE — Progress Notes (Signed)
Met with patient to discuss RO billing.  Found out patient has already qualified for indigent program.  Account was not coded as indigent, made change and created folder in [employer].

## 2011-09-07 ENCOUNTER — Other Ambulatory Visit: Payer: Self-pay | Admitting: *Deleted

## 2011-09-07 ENCOUNTER — Ambulatory Visit (HOSPITAL_BASED_OUTPATIENT_CLINIC_OR_DEPARTMENT_OTHER): Payer: Medicare Other | Admitting: Lab

## 2011-09-07 ENCOUNTER — Encounter: Payer: Self-pay | Admitting: Internal Medicine

## 2011-09-07 ENCOUNTER — Ambulatory Visit (HOSPITAL_BASED_OUTPATIENT_CLINIC_OR_DEPARTMENT_OTHER): Payer: Medicare Other | Admitting: Physician Assistant

## 2011-09-07 ENCOUNTER — Encounter: Payer: Self-pay | Admitting: Physician Assistant

## 2011-09-07 VITALS — BP 118/64 | HR 105 | Temp 97.9°F | Ht 64.0 in | Wt 116.9 lb

## 2011-09-07 DIAGNOSIS — Z09 Encounter for follow-up examination after completed treatment for conditions other than malignant neoplasm: Secondary | ICD-10-CM

## 2011-09-07 DIAGNOSIS — J209 Acute bronchitis, unspecified: Secondary | ICD-10-CM

## 2011-09-07 DIAGNOSIS — R509 Fever, unspecified: Secondary | ICD-10-CM

## 2011-09-07 DIAGNOSIS — C3490 Malignant neoplasm of unspecified part of unspecified bronchus or lung: Secondary | ICD-10-CM

## 2011-09-07 LAB — CBC WITH DIFFERENTIAL/PLATELET
BASO%: 0.3 % (ref 0.0–2.0)
Basophils Absolute: 0 10*3/uL (ref 0.0–0.1)
EOS%: 0.2 % (ref 0.0–7.0)
HCT: 27.7 % — ABNORMAL LOW (ref 34.8–46.6)
HGB: 9.3 g/dL — ABNORMAL LOW (ref 11.6–15.9)
MCH: 36.7 pg — ABNORMAL HIGH (ref 25.1–34.0)
MONO#: 1.5 10*3/uL — ABNORMAL HIGH (ref 0.1–0.9)
NEUT%: 74 % (ref 38.4–76.8)
RDW: 15.1 % — ABNORMAL HIGH (ref 11.2–14.5)
WBC: 7.4 10*3/uL (ref 3.9–10.3)
lymph#: 0.4 10*3/uL — ABNORMAL LOW (ref 0.9–3.3)

## 2011-09-07 MED ORDER — MOXIFLOXACIN HCL 400 MG PO TABS: 400.0000 mg | ORAL_TABLET | Freq: Every day | ORAL | Status: AC

## 2011-09-07 NOTE — Progress Notes (Signed)
09/07/2011 Dr. Arbutus Ped just wanted to make you aware that I did resubmit the chest ct scan for this patient and it was approved.

## 2011-09-12 ENCOUNTER — Other Ambulatory Visit (HOSPITAL_BASED_OUTPATIENT_CLINIC_OR_DEPARTMENT_OTHER): Payer: Medicare Other

## 2011-09-12 DIAGNOSIS — C3490 Malignant neoplasm of unspecified part of unspecified bronchus or lung: Secondary | ICD-10-CM

## 2011-09-12 LAB — COMPREHENSIVE METABOLIC PANEL
Albumin: 4 g/dL (ref 3.5–5.2)
BUN: 11 mg/dL (ref 6–23)
Calcium: 8.8 mg/dL (ref 8.4–10.5)
Chloride: 107 mEq/L (ref 96–112)
Creatinine, Ser: 0.87 mg/dL (ref 0.50–1.10)
Glucose, Bld: 71 mg/dL (ref 70–99)
Potassium: 4.1 mEq/L (ref 3.5–5.3)

## 2011-09-12 LAB — CBC WITH DIFFERENTIAL/PLATELET
Basophils Absolute: 0 10*3/uL (ref 0.0–0.1)
Eosinophils Absolute: 0 10*3/uL (ref 0.0–0.5)
HCT: 28.6 % — ABNORMAL LOW (ref 34.8–46.6)
HGB: 9.3 g/dL — ABNORMAL LOW (ref 11.6–15.9)
MCH: 35.1 pg — ABNORMAL HIGH (ref 25.1–34.0)
MCV: 107.9 fL — ABNORMAL HIGH (ref 79.5–101.0)
NEUT#: 6.6 10*3/uL — ABNORMAL HIGH (ref 1.5–6.5)
NEUT%: 80.4 % — ABNORMAL HIGH (ref 38.4–76.8)
RDW: 14.5 % (ref 11.2–14.5)
lymph#: 1.3 10*3/uL (ref 0.9–3.3)

## 2011-09-12 NOTE — Progress Notes (Signed)
[facility] OFFICE PROGRESS NOTE  Janice Fillers, MD, MD [address] [facility], [place]. 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   PRIOR THERAPY: Status post palliative radiotherapy to the right hilum under the care Dr. Dayton Scrape  CURRENT THERAPY: Systemic chemotherapy with carboplatin for AUC of 5 on day 1 and etoposide 120 mg/M2 on days 1,2 and 3 with Neulasta support on day 4, status post 6 cycles   INTERVAL HISTORY: Janice Daniels 66 y.o. female returns to the clinic today for work in visit. She presents for further evaluation of fever or chills and productive cough. She developed chills yesterday he evening at about 5 PM and a MAXIMUM TEMPERATURE at home of 102.2. The symptoms are associated with a cough productive of yellow secretions and mid chest congestion. She voiced no other complaints. She's currently status post 6 cycles of systemic chemotherapy with carboplatin and etoposide with Neulasta support for her limited stage small cell lung cancer that was diagnosed in October 2012.   MEDICAL HISTORY: Past Medical History  Diagnosis Date  . Hypertension   . Hypothyroid   . GERD (gastroesophageal reflux disease)   . COPD (chronic obstructive pulmonary disease)   . Low back pain   . Lung cancer 05/09/2011    RUL  . Depression     ALLERGIES:  is allergic to codeine.  MEDICATIONS:  Current Outpatient Prescriptions  Medication Sig Dispense Refill  . acetaminophen (TYLENOL) 500 MG tablet Take 500 mg by mouth every 6 (six) hours as needed.        Marland Kitchen albuterol (PROVENTIL) (2.5 MG/3ML) 0.083% nebulizer solution Take 2.5 mg by nebulization every 4 (four) hours as needed.        Marland Kitchen albuterol (VENTOLIN HFA) 108 (90 BASE) MCG/ACT inhaler Inhale 2 puffs into the lungs every 6 (six) hours as needed.        . benazepril-hydrochlorthiazide (LOTENSIN HCT) 10-12.5 MG per tablet Take  1 tablet by mouth daily as needed.       . citalopram (CELEXA) 10 MG tablet Take 10 mg by mouth daily.        Marland Kitchen docusate sodium (COLACE) 100 MG capsule Take 100 mg by mouth 2 (two) times daily. 2 po bid       . Fluticasone-Salmeterol (ADVAIR) 250-50 MCG/DOSE AEPB Inhale 1 puff into the lungs 2 (two) times daily.        Marland Kitchen levothyroxine (SYNTHROID, LEVOTHROID) 112 MCG tablet Take 112 mcg by mouth daily.        Marland Kitchen loratadine (CLARITIN) 10 MG tablet Take 10 mg by mouth daily. prn       . methylPREDNISolone (MEDROL DOSEPAK) 4 MG tablet Take by mouth as directed. follow package directions. ON HOLD      . moxifloxacin (AVELOX) 400 MG tablet Take 1 tablet (400 mg total) by mouth daily.  5 tablet  0  . Multiple Vitamin (MULTIVITAMIN) tablet Take 1 tablet by mouth daily.        Marland Kitchen omeprazole (PRILOSEC OTC) 20 MG tablet Take 20 mg by mouth daily.       . potassium chloride SA (K-DUR,KLOR-CON) 20 MEQ tablet TAKE 1 DAILY FOR 7 DAYS  7 tablet  0  . prochlorperazine (COMPAZINE) 10 MG tablet Take 10 mg by mouth every 6 (six) hours as needed.        . roflumilast (DALIRESP) 500 MCG  TABS tablet Take 500 mcg by mouth daily.        Marland Kitchen tiotropium (SPIRIVA HANDIHALER) 18 MCG inhalation capsule Place 1 capsule (18 mcg total) into inhaler and inhale daily.  30 capsule  6    SURGICAL HISTORY:  Past Surgical History  Procedure Date  . Tubal ligation 1984  . Total abdominal hysterectomy 1986  . Tonsillectomy     REVIEW OF SYSTEMS:  A comprehensive review of systems was negative except for: Constitutional: positive for chills and fevers Respiratory: positive for cough and sputum   PHYSICAL EXAMINATION: General appearance: alert, cooperative and no distress Head: Normocephalic, without obvious abnormality, atraumatic Neck: no adenopathy Lymph nodes: Cervical, supraclavicular, and axillary nodes normal. Resp: clear to auscultation bilaterally Cardio: regular rate and rhythm, S1, S2 normal, no murmur, click, rub or  gallop GI: soft, non-tender; bowel sounds normal; no masses,  no organomegaly Extremities: extremities normal, atraumatic, no cyanosis or edema Neurologic: Alert and oriented X 3, normal strength and tone. Normal symmetric reflexes. Normal coordination and gait  ECOG PERFORMANCE STATUS: 1 - Symptomatic but completely ambulatory  Blood pressure 118/64, pulse 105, temperature 97.9 F (36.6 C), temperature source Oral, height 5\' 4"  (1.626 m), weight 116 lb 14.4 oz (53.025 kg).  LABORATORY DATA: Lab Results  Component Value Date   WBC 8.2 09/12/2011   HGB 9.3* 09/12/2011   HCT 28.6* 09/12/2011   MCV 107.9* 09/12/2011   PLT 109* 09/12/2011      Chemistry      Component Value Date/Time   NA 141 09/12/2011 1032   K 4.1 09/12/2011 1032   CL 107 09/12/2011 1032   CO2 25 09/12/2011 1032   BUN 11 09/12/2011 1032   CREATININE 0.87 09/12/2011 1032      Component Value Date/Time   CALCIUM 8.8 09/12/2011 1032   ALKPHOS 83 09/12/2011 1032   AST 11 09/12/2011 1032   ALT <8 09/12/2011 1032   BILITOT 0.3 09/12/2011 1032       RADIOGRAPHIC STUDIES: Ct Head W Wo Contrast  08/02/2011  *RADIOLOGY REPORT*  Clinical Data: 66 year old female with lung cancer for restaging.  CT HEAD WITHOUT AND WITH CONTRAST  Technique:  Contiguous axial images were obtained from the base of the skull through the vertex without and with intravenous contrast.  Contrast: OMNIPAQUE IOHEXOL 300 MG/ML IV SOLN In conjunction with CT(s) of the chest which is(are) reported separately.  Comparison: Brain MRI 05/16/2011.  Findings: Visualized paranasal sinuses and mastoids are clear.  No suspicious osseous lesion. Visualized orbits and scalp soft tissues are within normal limits.  Mild Calcified atherosclerosis at the skull base.  Mild to moderate cerebral white matter hypodensity similar to the prior exam.  No ventriculomegaly. No midline shift, mass effect, or evidence of mass lesion.  No acute intracranial hemorrhage identified.  No  evidence of cortically based acute infarction identified.  No abnormal enhancement identified.  Major intracranial vascular structures are enhancing.  IMPRESSION:  1. No acute or metastatic intracranial abnormality. 2.  Chest CT reported separately.  Original Report Authenticated By: Harley Hallmark, M.D.   Ct Chest W Contrast  08/02/2011  *RADIOLOGY REPORT*  Clinical Data: History of lung cancer status post chemotherapy and radiation therapy (radiation therapy completed 07/18/2011). Restaging scan.  CT CHEST WITH CONTRAST  Technique:  Multidetector CT imaging of the chest was performed following the standard protocol during bolus administration of intravenous contrast.  Contrast: OMNIPAQUE IOHEXOL 300 MG/ML IV SOLN  Comparison: Chest CT 06/23/2011.  Findings:  Mediastinum: Heart size is normal. There is no significant pericardial fluid, thickening or pericardial calcification.  The mild atherosclerosis of the thoracic aorta and great vessels of the mediastinum.  Prominent nodal tissue in the right hilar region measuring 1.4 cm in short axis is again noted.  No other pathologically enlarged mediastinal or hilar lymph nodes are noted. Esophagus is normal in appearance.  Lungs/Pleura: When compared to prior examinations, the size of the previously noted right upper lobe nodule continues to decrease, currently measuring 1.7 x 2.4 cm (on axial image 26 of series eight).  There is surrounding interstitial prominence and architectural distortion in the adjacent right lung, compatible with evolving postradiation changes. There are otherwise no new or enlarging suspicious appearing pulmonary nodules or masses identified on today's CT examination.  Areas of bronchial wall thickening and peribronchovascular ground-glass attenuation and micronodularity are noted in the posterior aspect of the lower lobes of the lungs bilaterally, a nonspecific finding, which may suggest sequelae of mild aspiration.  No frank airspace  consolidation on today's examination.  No pleural effusions.  Upper Abdomen: Atherosclerosis.  Visualized portions of upper abdomen are otherwise unremarkable.  Musculoskeletal: There are no aggressive appearing lytic or blastic lesions noted in the visualized portions of the skeleton.  IMPRESSION:  1.  Today's scan again demonstrates positive response to therapy with decreasing size of right upper lobe nodule, stable size of right hilar lymph nodes, and no evidence to suggest new disease in the thorax. 2.  Findings in the lung bases bilaterally are nonspecific and may suggest sequelae of mild aspiration.  Alternatively, if these regions were within the radiation field, these could be evolving postradiation changes as well. 3.  Mild centrilobular emphysema. 4.  Atherosclerosis.  Original Report Authenticated By: Florencia Reasons, M.D.    ASSESSMENT/PLAN: This is a very pleasant 66 years old white female with limited stage small cell lung cancer status post 6 cycles of systemic chemotherapy with carboplatin and etoposide concurrent with radiotherapy. Her current symptoms are suspicious for bronchitis. Patient was discussed Dr. Arbutus Ped will place her on a course of Lovenox and 50 mg by mouth daily for 5 days. She is instructed to present to the emergency room for symptoms persist or worsen. She is to followup as previously scheduled with her restaging CT scans and proceed with her whole brain radiation as scheduled.  Laural Benes, Dominick Zertuche E, PA-C   All questions were answered. The patient knows to call the clinic with any problems, questions or concerns. We can certainly see the patient much sooner if necessary.

## 2011-09-13 ENCOUNTER — Ambulatory Visit
Admission: RE | Admit: 2011-09-13 | Discharge: 2011-09-13 | Disposition: A | Discharge status: Home / Self Care | Payer: Medicare Other | Source: Ambulatory Visit | Attending: Radiation Oncology | Admitting: Radiation Oncology

## 2011-09-13 ENCOUNTER — Encounter: Payer: Self-pay | Admitting: Radiation Oncology

## 2011-09-13 NOTE — Progress Notes (Signed)
Simulation verification note: The patient underwent seamless verification for treatment to her whole brain. Her isocenter is in good position and the multileaf collimators contoured the treatment volume appropriately.

## 2011-09-14 ENCOUNTER — Ambulatory Visit
Admission: RE | Admit: 2011-09-14 | Discharge: 2011-09-14 | Disposition: A | Discharge status: Home / Self Care | Payer: Medicare Other | Source: Ambulatory Visit | Attending: Radiation Oncology | Admitting: Radiation Oncology

## 2011-09-15 ENCOUNTER — Ambulatory Visit (HOSPITAL_COMMUNITY)
Admission: RE | Admit: 2011-09-15 | Discharge: 2011-09-15 | Disposition: A | Discharge status: Home / Self Care | Payer: Medicare Other | Source: Ambulatory Visit | Attending: Physician Assistant | Admitting: Physician Assistant

## 2011-09-15 ENCOUNTER — Other Ambulatory Visit: Payer: Self-pay | Admitting: Physician Assistant

## 2011-09-15 ENCOUNTER — Ambulatory Visit
Admission: RE | Admit: 2011-09-15 | Discharge: 2011-09-15 | Disposition: A | Discharge status: Home / Self Care | Payer: Medicare Other | Source: Ambulatory Visit | Attending: Radiation Oncology | Admitting: Radiation Oncology

## 2011-09-15 DIAGNOSIS — J438 Other emphysema: Secondary | ICD-10-CM | POA: Insufficient documentation

## 2011-09-15 DIAGNOSIS — I2510 Atherosclerotic heart disease of native coronary artery without angina pectoris: Secondary | ICD-10-CM | POA: Insufficient documentation

## 2011-09-15 DIAGNOSIS — C3490 Malignant neoplasm of unspecified part of unspecified bronchus or lung: Secondary | ICD-10-CM | POA: Insufficient documentation

## 2011-09-15 MED ORDER — IOHEXOL 300 MG/ML  SOLN
80.0000 mL | Freq: Once | INTRAMUSCULAR | Status: AC | PRN
  Administered 2011-09-15: 80 mL via INTRAVENOUS

## 2011-09-18 ENCOUNTER — Ambulatory Visit
Admission: RE | Admit: 2011-09-18 | Discharge: 2011-09-18 | Disposition: A | Discharge status: Home / Self Care | Payer: Medicare Other | Source: Ambulatory Visit | Attending: Radiation Oncology | Admitting: Radiation Oncology

## 2011-09-18 ENCOUNTER — Encounter: Payer: Self-pay | Admitting: Radiation Oncology

## 2011-09-18 VITALS — BP 145/78 | HR 116 | Resp 18 | Wt 119.7 lb

## 2011-09-18 DIAGNOSIS — C3490 Malignant neoplasm of unspecified part of unspecified bronchus or lung: Secondary | ICD-10-CM

## 2011-09-18 NOTE — Progress Notes (Signed)
Patient presents to the clinic today unaccompanied for under treat visit with Dr. Dayton Scrape. Patient is alert and oriented to person, place, and time. No distress noted. Steady gait noted. Pleasant affect noted. Patient denies pain at this time. However, patient reports that every morning a headache at the base of her skull wakes her up requiring her to take Tylenol 1500 mg for relief. Patient denies nausea, vomiting, dizziness, or diplopia. Patient denies diarrhea. Patient reports that on rare occasion she will have a dry cough. Reported all findings to Dr. Dayton Scrape.

## 2011-09-18 NOTE — Progress Notes (Signed)
Weekly Management Note:  Site:Whole Brain Current Dose:  1000  cGy Projected Dose: 2500  cGy  Narrative: The patient is seen today for routine under treatment assessment. CBCT/MVCT images/port films were reviewed. The chart was reviewed.   No complaints today except for posterior/occipital headaches which she attributes to her arthritis.  Physical Examination:  Filed Vitals:   09/18/11 1157  BP: 145/78  Pulse: 116  Resp: 18  .  Weight: 119 lb 11.2 oz (54.296 kg). Slight regrowth of hair with no significant scalp erythema. No new alopecia.  Impression: Tolerating radiation therapy well. The possible tension headaches versus cervical arthritis.  Plan: Continue radiation therapy as planned.

## 2011-09-19 ENCOUNTER — Other Ambulatory Visit: Payer: Medicare Other

## 2011-09-19 ENCOUNTER — Telehealth: Payer: Self-pay | Admitting: Internal Medicine

## 2011-09-19 ENCOUNTER — Ambulatory Visit
Admission: RE | Admit: 2011-09-19 | Discharge: 2011-09-19 | Disposition: A | Discharge status: Home / Self Care | Payer: Medicare Other | Source: Ambulatory Visit | Attending: Radiation Oncology | Admitting: Radiation Oncology

## 2011-09-19 ENCOUNTER — Ambulatory Visit (HOSPITAL_BASED_OUTPATIENT_CLINIC_OR_DEPARTMENT_OTHER): Payer: Medicare Other | Admitting: Internal Medicine

## 2011-09-19 VITALS — BP 127/73 | HR 105 | Temp 97.2°F | Ht 64.0 in | Wt 120.0 lb

## 2011-09-19 DIAGNOSIS — C3490 Malignant neoplasm of unspecified part of unspecified bronchus or lung: Secondary | ICD-10-CM

## 2011-09-19 LAB — COMPREHENSIVE METABOLIC PANEL
AST: 12 U/L (ref 0–37)
Albumin: 3.9 g/dL (ref 3.5–5.2)
BUN: 15 mg/dL (ref 6–23)
CO2: 28 mEq/L (ref 19–32)
Calcium: 8.9 mg/dL (ref 8.4–10.5)
Chloride: 105 mEq/L (ref 96–112)
Potassium: 4.2 mEq/L (ref 3.5–5.3)

## 2011-09-19 LAB — CBC WITH DIFFERENTIAL/PLATELET
Basophils Absolute: 0 10*3/uL (ref 0.0–0.1)
EOS%: 0.1 % (ref 0.0–7.0)
Eosinophils Absolute: 0 10*3/uL (ref 0.0–0.5)
HCT: 27.8 % — ABNORMAL LOW (ref 34.8–46.6)
HGB: 9.3 g/dL — ABNORMAL LOW (ref 11.6–15.9)
MONO#: 0.9 10*3/uL (ref 0.1–0.9)
NEUT#: 4.9 10*3/uL (ref 1.5–6.5)
RDW: 15.2 % — ABNORMAL HIGH (ref 11.2–14.5)
WBC: 6.3 10*3/uL (ref 3.9–10.3)
lymph#: 0.5 10*3/uL — ABNORMAL LOW (ref 0.9–3.3)

## 2011-09-19 NOTE — Telephone Encounter (Signed)
appts made and printed for pt aom °

## 2011-09-19 NOTE — Progress Notes (Signed)
[facility] [phone]   [phone]  OFFICE PROGRESS NOTE  Garlan Fillers, MD, MD [address] [facility], [place]. 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   PRIOR THERAPY:  1) Status post palliative radiotherapy to the right hilum under the care Dr. Dayton Scrape. 2)  Systemic chemotherapy with carboplatin for AUC of 5 on day 1 and etoposide 120 mg/M2 on days 1,2 and 3 with Neulasta support on day 4, status post 6 cycles  CURRENT THERAPY: Prophylactic cranial irradiation under the care of Dr. Dayton Scrape.   INTERVAL HISTORY: Janice Daniels 66 y.o. female returns to the clinic today for followup visit accompanied by her sister. The patient tolerated the last cycle of her chemotherapy fairly well with no significant complaints except for mild fatigue. She denied having any fever or chills. She has no chest pain or shortness of breath, no cough or hemoptysis. She has no nausea or vomiting and no significant weight loss. The patient has repeat CT scan of the chest performed recently and she is here today for evaluation and discussion of her scan results.  MEDICAL HISTORY: Past Medical History  Diagnosis Date  . Hypertension   . Hypothyroid   . GERD (gastroesophageal reflux disease)   . COPD (chronic obstructive pulmonary disease)   . Low back pain   . Lung cancer 05/09/2011    RUL  . Depression     ALLERGIES:  is allergic to codeine.  MEDICATIONS:  Current Outpatient Prescriptions  Medication Sig Dispense Refill  . albuterol (PROVENTIL) (2.5 MG/3ML) 0.083% nebulizer solution Take 2.5 mg by nebulization every 4 (four) hours as needed.        . benazepril-hydrochlorthiazide (LOTENSIN HCT) 10-12.5 MG per tablet Take 1 tablet by mouth daily as needed.       . citalopram (CELEXA) 10 MG tablet Take 10 mg by mouth daily.        Marland Kitchen docusate sodium (COLACE) 100 MG capsule  Take 100 mg by mouth 2 (two) times daily. 2 po bid       . Fluticasone-Salmeterol (ADVAIR) 250-50 MCG/DOSE AEPB Inhale 1 puff into the lungs 2 (two) times daily.        Marland Kitchen levothyroxine (SYNTHROID, LEVOTHROID) 112 MCG tablet Take 112 mcg by mouth daily.        Marland Kitchen loratadine (CLARITIN) 10 MG tablet Take 10 mg by mouth daily. prn       . Multiple Vitamin (MULTIVITAMIN) tablet Take 1 tablet by mouth daily.        Marland Kitchen omeprazole (PRILOSEC OTC) 20 MG tablet Take 20 mg by mouth daily.       Marland Kitchen tiotropium (SPIRIVA HANDIHALER) 18 MCG inhalation capsule Place 1 capsule (18 mcg total) into inhaler and inhale daily.  30 capsule  6  . acetaminophen (TYLENOL) 500 MG tablet Take 500 mg by mouth every 6 (six) hours as needed.        Marland Kitchen albuterol (VENTOLIN HFA) 108 (90 BASE) MCG/ACT inhaler Inhale 2 puffs into the lungs every 6 (six) hours as needed.        Marland Kitchen levofloxacin (LEVAQUIN) 500 MG tablet Take 500 mg by mouth daily.      . methylPREDNISolone (MEDROL DOSEPAK) 4 MG tablet Take by mouth as directed. follow package directions. ON HOLD      . prochlorperazine (COMPAZINE) 10 MG tablet Take 10 mg by mouth every 6 (six)  hours as needed.        . roflumilast (DALIRESP) 500 MCG TABS tablet Take 500 mcg by mouth daily.         SURGICAL HISTORY:  Past Surgical History  Procedure Date  . Tubal ligation 1984  . Total abdominal hysterectomy 1986  . Tonsillectomy     REVIEW OF SYSTEMS:  A comprehensive review of systems was negative except for: Constitutional: positive for fatigue   PHYSICAL EXAMINATION: General appearance: alert, cooperative and no distress Head: Normocephalic, without obvious abnormality, atraumatic Neck: no adenopathy Lymph nodes: Cervical, supraclavicular, and axillary nodes normal. Resp: clear to auscultation bilaterally Cardio: regular rate and rhythm, S1, S2 normal, no murmur, click, rub or gallop GI: soft, non-tender; bowel sounds normal; no masses,  no organomegaly Extremities:  extremities normal, atraumatic, no cyanosis or edema Neurologic: Alert and oriented X 3, normal strength and tone. Normal symmetric reflexes. Normal coordination and gait  ECOG PERFORMANCE STATUS: 0 - Asymptomatic  Blood pressure 127/73, pulse 105, temperature 97.2 F (36.2 C), temperature source Oral, height 5\' 4"  (1.626 m), weight 120 lb (54.432 kg).  LABORATORY DATA: Lab Results  Component Value Date   WBC 6.3 09/19/2011   HGB 9.3* 09/19/2011   HCT 27.8* 09/19/2011   MCV 108.9* 09/19/2011   PLT 271 09/19/2011      Chemistry      Component Value Date/Time   NA 141 09/12/2011 1032   K 4.1 09/12/2011 1032   CL 107 09/12/2011 1032   CO2 25 09/12/2011 1032   BUN 11 09/12/2011 1032   CREATININE 0.87 09/12/2011 1032      Component Value Date/Time   CALCIUM 8.8 09/12/2011 1032   ALKPHOS 83 09/12/2011 1032   AST 11 09/12/2011 1032   ALT <8 09/12/2011 1032   BILITOT 0.3 09/12/2011 1032       RADIOGRAPHIC STUDIES: Ct Chest W Contrast  09/15/2011  *RADIOLOGY REPORT*  Clinical Data: History of lung cancer diagnosed in September 2012 status post chemotherapy and radiation therapy.  Restaging scan. Cough and shortness of breath.  CT CHEST WITH CONTRAST  Technique:  Multidetector CT imaging of the chest was performed following the standard protocol during bolus administration of intravenous contrast.  Contrast:  80 ml of Omnipaque-300.  Comparison: Chest CT 08/02/2011.  Findings:  Mediastinum: Heart size is normal. There is no significant pericardial fluid, thickening or pericardial calcification.  Some prominent nodal tissue is again seen in the right suprahilar region (adjacent to the right upper lobe bronchus), measuring approximately 2.5 x 0.4 cm (unchanged).  No other pathologically enlarged mediastinal or hilar lymph nodes are appreciated on today's examination.  The esophagus is unremarkable in appearance. There is atherosclerosis of the thoracic aorta, the great vessels of the mediastinum and the  coronary arteries, including calcified atherosclerotic plaque in the the left anterior descending and right coronary arteries.  Lungs/Pleura: There is a background of mild centrilobular and paraseptal emphysema.  In the region where the right upper lobe mass was previously noted there is now only an architectural distortion, indicative of positive response to treatment.  More laterally in the right upper lobe there is an area of ground-glass attenuation, air space consolidation and retraction of the overlying pleura, which may represent some post obstructive pneumonitis, or evolving postradiation changes. However, more cephalad within the right upper lobe (image 18 of series 3) there is a focal area of nodular air space consolidation which measures approximately 2.8 cm in diameter, that is also favored to represent  some post radiation changes (although correlation for signs and symptoms of active infection is likely warranted).  Other patchy smaller peribronchovascular airspace nodules are noted throughout the right upper and lower lobe, as well as a lobe to a lesser extent the dependent portion of the left lower lobe (including an area of peripheral air space consolidation in the medial basal segment of the left lower lobe). While many of these may reflect post radiation changes, others are suggestive of endobronchial spread of either inflammatory exudate, or infection. There are no pleural effusions.  Upper Abdomen: Atherosclerosis in the visualized vasculature. Otherwise, unremarkable.  Musculoskeletal: There are no aggressive appearing lytic or blastic lesions noted in the visualized portions of the skeleton. Compression fracture of superior endplate of L2 with approximately 15% loss of height, unchanged.  IMPRESSION: 1.  Today's study demonstrates continued positive response to therapy with essentially complete resolution of the previously noted right upper lobe mass (now replaced with an area of architectural  distortion and surrounding postradiation changes). Previously noted right-sided suprahilar adenopathy is unchanged. 2. Evolving postradiation changes in the upper right lung, as above, including a somewhat nodular appearing area of airspace consolidation measuring 2.8 cm in the right upper lobe on image 18 of series 3.  In addition, there are multifocal peribronchovascular airspace nodules scattered throughout the lungs bilaterally, predominantly in a dependent distribution.  While these may simply reflect endobronchial spread of an inflammatory exudate from the radiation changes in the right upper lung, clinical correlation to exclude signs of symptoms of active infection is highly recommended, as this may alternatively reflect endobronchial spread of infection. 3. Atherosclerosis, including two-vessel coronary artery disease. Please note that although the presence of coronary artery calcium documents the presence of coronary artery disease, the severity of this disease and any potential stenosis cannot be assessed on this non-gated CT examination.  Assessment for potential risk factor modification, dietary therapy or pharmacologic therapy may be warranted, if clinically indicated. 4.  Mild centrilobular and paraseptal emphysema.  These results were called by telephone on 09/15/2011  at  11:30 a.m. to  PA Tiana Loft, who verbally acknowledged these results.  Original Report Authenticated By: Florencia Reasons, M.D.    ASSESSMENT: This is a very pleasant 66 years old white female with history of limited stage small cell lung cancer status post systemic chemotherapy with carboplatin and etoposide status post 6 cycles this was concurrent with radiotherapy during the first 2 cycles. The patient has almost complete response to this treatment. I personally reviewed the images and discuss the scan results with the patient.  PLAN: I recommend for her to complete the prophylactic irradiation as scheduled by Dr.  Dayton Scrape. I would see her back for followup visit in 2 months for reevaluation and repeat CT scan of the chest. The patient was advised to call me immediately if he has any concerning symptoms in the interval.  All questions were answered. The patient knows to call the clinic with any problems, questions or concerns. We can certainly see the patient much sooner if necessary.  I spent 20 minutes counseling the patient face to face. The total time spent in the appointment was 30 minutes.

## 2011-09-20 ENCOUNTER — Ambulatory Visit
Admission: RE | Admit: 2011-09-20 | Discharge: 2011-09-20 | Disposition: A | Discharge status: Home / Self Care | Payer: Medicare Other | Source: Ambulatory Visit | Attending: Radiation Oncology | Admitting: Radiation Oncology

## 2011-09-20 NOTE — Progress Notes (Deleted)
MORPHINE 8MG  IM IN RIGHT DELTOID FOR PAIN IN THROAT RATED 9/10 THIS AM.  PATIENT TO GET TX IN 30 MIN.  HAS HIS WIFE WITH HIM TO DRIVE.  ALSO GAVE HIM HYDROGEL , 3, TO USE ON SKIN AT FOLD OF NECK WITH INSTRUCTIONS FOR USE.  ALL OF THIS PER DR. Luciano Cutter ORDER.

## 2011-09-21 ENCOUNTER — Ambulatory Visit
Admission: RE | Admit: 2011-09-21 | Discharge: 2011-09-21 | Disposition: A | Discharge status: Home / Self Care | Payer: Medicare Other | Source: Ambulatory Visit | Attending: Radiation Oncology | Admitting: Radiation Oncology

## 2011-09-21 ENCOUNTER — Encounter: Payer: Self-pay | Admitting: Radiation Oncology

## 2011-09-21 VITALS — BP 100/67 | HR 92 | Resp 18 | Wt 119.1 lb

## 2011-09-21 DIAGNOSIS — C3490 Malignant neoplasm of unspecified part of unspecified bronchus or lung: Secondary | ICD-10-CM

## 2011-09-21 NOTE — Progress Notes (Signed)
Patient presents to the clinic today unaccompanied for an under treat visit with Dr. Dayton Scrape. Patient is alert and oriented to person, place, and time. No distress noted. Steady gait noted. Pleasant affect noted. Patient denies pain at this time. Patient denies SOB. Patient reports an occasional dry cough related to effects of allergies. Patient denies nausea, vomiting, headache, dizziness or diarrhea. Reported all findings to Dr. Dayton Scrape.

## 2011-09-21 NOTE — Progress Notes (Signed)
Weekly Management Note:  Site:Whole brain Current Dose:  1750  cGy Projected Dose: 2500  cGy  Narrative: The patient is seen today for routine under treatment assessment. CBCT/MVCT images/port films were reviewed. The chart was reviewed.   No complaints today.   Physical Examination:  Filed Vitals:   09/21/11 1155  BP: 100/67  Pulse: 92  Resp: 18  .  Weight: 119 lb 1.6 oz (54.023 kg). 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  Impression: Tolerating radiation therapy well.  Plan: Continue radiation therapy as planned. She'll finish her treatment early next week. I will give him scheduled for a one-month followup visit.

## 2011-09-22 ENCOUNTER — Ambulatory Visit
Admission: RE | Admit: 2011-09-22 | Discharge: 2011-09-22 | Disposition: A | Discharge status: Home / Self Care | Payer: Medicare Other | Source: Ambulatory Visit | Attending: Radiation Oncology | Admitting: Radiation Oncology

## 2011-09-25 ENCOUNTER — Ambulatory Visit
Admission: RE | Admit: 2011-09-25 | Discharge: 2011-09-25 | Disposition: A | Discharge status: Home / Self Care | Payer: Medicare Other | Source: Ambulatory Visit | Attending: Radiation Oncology | Admitting: Radiation Oncology

## 2011-09-26 ENCOUNTER — Ambulatory Visit
Admission: RE | Admit: 2011-09-26 | Discharge: 2011-09-26 | Disposition: A | Discharge status: Home / Self Care | Payer: Medicare Other | Source: Ambulatory Visit | Attending: Radiation Oncology | Admitting: Radiation Oncology

## 2011-09-26 NOTE — Progress Notes (Signed)
Patient completed treatment today. Patient denies complaints and reports she is feeling well. One month follow up appointment established. Patient denies need to see Dr. Basilio Cairo today who is covering for Dr. Dayton Scrape. Encouraged patient to call with needs. Patient verbalized understanding.

## 2011-09-30 ENCOUNTER — Encounter: Payer: Self-pay | Admitting: Radiation Oncology

## 2011-09-30 NOTE — Progress Notes (Signed)
[facility] Radiation Oncology End of Treatment Note  Name:Janice Daniels  Date: 09/30/2011 EAV:[id number] DOB:02-Oct-1945   Status:outpatient    CC: Garlan Fillers, MD, MD  Dr. Woodroe Mode  REFERRING PHYSICIAN: Dr. Woodroe Mode   DIAGNOSIS: Limited stage small cell carcinoma of the lung   INDICATION FOR TREATMENT: Curative   TREATMENT DATES: 09/13/2011 through 09/26/2011                          SITE/DOSE:     Whole brain 2500 cGy 10 sessions                       BEAMS/ENERGY:     6 MV photons parallel opposed lateral fields              NARRATIVE:      The patient tolerated her prophylactic cranial irradiation without difficulty, and in particular, no skin/scalp toxicity.                      PLAN: Routine followup in one month. Patient instructed to call if questions or worsening complaints in interim.

## 2011-10-09 ENCOUNTER — Encounter: Payer: Self-pay | Admitting: Pulmonary Disease

## 2011-10-09 ENCOUNTER — Ambulatory Visit (INDEPENDENT_AMBULATORY_CARE_PROVIDER_SITE_OTHER): Payer: Medicare Other | Admitting: Pulmonary Disease

## 2011-10-09 VITALS — BP 112/62 | HR 98 | Temp 97.9°F | Ht 64.0 in | Wt 118.2 lb

## 2011-10-09 DIAGNOSIS — J449 Chronic obstructive pulmonary disease, unspecified: Secondary | ICD-10-CM

## 2011-10-09 NOTE — Progress Notes (Signed)
  Subjective:    Patient ID: Janice Daniels, female    DOB: Feb 18, 1946, 66 y.o.   MRN: [id number]  HPI The patient comes in today for followup of her known COPD.  She also has a history of small cell lung cancer for which she has received chemotherapy and radiation.  Her restaging showed no obvious disease remaining.  The patient feels that her breathing has been doing very very well, and she is actually only using albuterol and Spiriva as needed.  She has stopped Advair.  She did have an episode of acute bronchitis in the beginning of the year, but feels that she is back to baseline.  She is satisfied with her current exertional tolerance level and has no significant cough.   Review of Systems  Constitutional: Negative.  Negative for fever and unexpected weight change.  HENT: Negative.  Negative for ear pain, nosebleeds, congestion, sore throat, rhinorrhea, sneezing, trouble swallowing, dental problem, postnasal drip and sinus pressure.   Eyes: Negative.  Negative for redness and itching.  Respiratory: Negative.  Negative for cough, chest tightness, shortness of breath and wheezing.   Cardiovascular: Negative.  Negative for palpitations and leg swelling.  Gastrointestinal: Negative.  Negative for nausea and vomiting.  Genitourinary: Negative.  Negative for dysuria.  Musculoskeletal: Negative.  Negative for joint swelling.  Skin: Positive for rash.  Neurological: Negative.  Negative for headaches.  Hematological: Negative.  Does not bruise/bleed easily.  Psychiatric/Behavioral: Negative.  Negative for dysphoric mood. The patient is not nervous/anxious.        Objective:   Physical Exam Thin female in no acute distress Nose without purulent discharge noted Chest with isolated faint wheeze in the left upper lung zone, otherwise clear Cardiac exam with regular rate and rhythm Lower extremities without edema, no cyanosis noted Alert and oriented, moves all 4 extremities.         Assessment & Plan:

## 2011-10-09 NOTE — Patient Instructions (Signed)
Ok to use spiriva, albuterol as needed as long as your breathing is at an acceptable level for you and you are not having flare-ups.  If any of this changes, you need to get back on you MAINTENANCE medications. Stay as active as possible followup with me in 6mos.

## 2011-10-09 NOTE — Assessment & Plan Note (Signed)
The patient has significant emphysema by her pulmonary function studies, but does not want to stay on maintenance medications.  As long as she is satisfied with her breathing, and is not having acute exacerbations, I am okay with this.  She understands that if she has another flareup this year in the next month or so she really needs to get back on maintenance meds.  I have also asked her to stay as active as possible.

## 2011-10-16 ENCOUNTER — Telehealth: Payer: Self-pay | Admitting: Internal Medicine

## 2011-10-16 NOTE — Telephone Encounter (Signed)
gve the pt her April,may 2013 appt calendars 

## 2011-10-18 ENCOUNTER — Ambulatory Visit
Admission: RE | Admit: 2011-10-18 | Discharge: 2011-10-18 | Disposition: A | Discharge status: Home / Self Care | Payer: Medicare Other | Source: Ambulatory Visit | Attending: Radiation Oncology | Admitting: Radiation Oncology

## 2011-10-18 ENCOUNTER — Ambulatory Visit: Payer: Medicare Other | Admitting: Radiation Oncology

## 2011-10-18 ENCOUNTER — Encounter: Payer: Self-pay | Admitting: Radiation Oncology

## 2011-10-18 VITALS — BP 131/71 | HR 78 | Temp 97.6°F | Resp 20 | Wt 119.2 lb

## 2011-10-18 DIAGNOSIS — C3490 Malignant neoplasm of unspecified part of unspecified bronchus or lung: Secondary | ICD-10-CM

## 2011-10-18 DIAGNOSIS — J449 Chronic obstructive pulmonary disease, unspecified: Secondary | ICD-10-CM | POA: Insufficient documentation

## 2011-10-18 HISTORY — DX: Personal history of irradiation: Z92.3

## 2011-10-18 NOTE — Progress Notes (Signed)
Followup note:  The patient returns today approximately 3 weeks for completion of prophylactic cranial irradiation in the management of her limited stage small cell carcinoma of the lung. She is to do well however she does have discomfort along both ears, left greater than right. There is no hearing loss. She describes an "itching" sensation along with pain. She also noted some swelling of her left upper eyelid and some draining of her eyes at night. She sees Dr. Arbutus Ped After repeat CT staging. Her last CT  of the chest in late March was without evidence for active disease.  Physical examination: Head and neck examination: There is alopecia. There is slight puffiness of her left upper eyelid but no conjunctivitis. Both your canals are clear and there appears to be a normal light reflex with no obvious serous otitis media. Cranial nerves 2-12 intact. Motor, sensory, and cerebellar exams are grossly nonfocal.  Impression: Satisfactory progress. She may have slight edema of her left upper eye from her prophylactic cranial radiation and this should resolve in the near future. I doubt that she has serous otitis media since she does not have any hearing loss. She may have some degree of otitis externa which should improve the near future.  Plan: She is to call me in 3-4 weeks if her ears are not improved. She maintain her followup with Dr. Arbutus Ped.

## 2011-10-18 NOTE — Progress Notes (Signed)
Pt c/o tenderness of scalp above ears, pain and tenderness of ears, pain w/pressure on ears, "feels like they are gluing together on the inside. My ears stop up but when I pull them they unstop." c/o eyes draining at night, "stuck together in morning. My eyes swell." Tylenol prn for pain w/good relief. Nausea past 3-4 days, "goes away on it's own" pt has Compazine prn.

## 2011-11-17 ENCOUNTER — Ambulatory Visit (HOSPITAL_COMMUNITY)
Admission: RE | Admit: 2011-11-17 | Discharge: 2011-11-17 | Disposition: A | Discharge status: Home / Self Care | Payer: Medicare Other | Source: Ambulatory Visit | Attending: Internal Medicine | Admitting: Internal Medicine

## 2011-11-17 ENCOUNTER — Other Ambulatory Visit (HOSPITAL_BASED_OUTPATIENT_CLINIC_OR_DEPARTMENT_OTHER): Payer: Medicare Other

## 2011-11-17 DIAGNOSIS — J438 Other emphysema: Secondary | ICD-10-CM | POA: Insufficient documentation

## 2011-11-17 DIAGNOSIS — I2510 Atherosclerotic heart disease of native coronary artery without angina pectoris: Secondary | ICD-10-CM | POA: Insufficient documentation

## 2011-11-17 DIAGNOSIS — I700 Atherosclerosis of aorta: Secondary | ICD-10-CM | POA: Insufficient documentation

## 2011-11-17 DIAGNOSIS — C3490 Malignant neoplasm of unspecified part of unspecified bronchus or lung: Secondary | ICD-10-CM

## 2011-11-17 DIAGNOSIS — Z9221 Personal history of antineoplastic chemotherapy: Secondary | ICD-10-CM | POA: Insufficient documentation

## 2011-11-17 DIAGNOSIS — Z923 Personal history of irradiation: Secondary | ICD-10-CM | POA: Insufficient documentation

## 2011-11-17 LAB — CBC WITH DIFFERENTIAL/PLATELET
Basophils Absolute: 0 10*3/uL (ref 0.0–0.1)
Eosinophils Absolute: 0.1 10*3/uL (ref 0.0–0.5)
HCT: 38.7 % (ref 34.8–46.6)
HGB: 13 g/dL (ref 11.6–15.9)
MCH: 33.3 pg (ref 25.1–34.0)
MONO#: 0.5 10*3/uL (ref 0.1–0.9)
NEUT#: 2.8 10*3/uL (ref 1.5–6.5)
NEUT%: 73.7 % (ref 38.4–76.8)
RDW: 12.5 % (ref 11.2–14.5)
WBC: 3.9 10*3/uL (ref 3.9–10.3)
lymph#: 0.4 10*3/uL — ABNORMAL LOW (ref 0.9–3.3)

## 2011-11-17 LAB — CMP (CANCER CENTER ONLY)
ALT(SGPT): 17 U/L (ref 10–47)
AST: 23 U/L (ref 11–38)
Alkaline Phosphatase: 60 U/L (ref 26–84)
Creat: 0.8 mg/dl (ref 0.6–1.2)
Total Bilirubin: 0.6 mg/dl (ref 0.20–1.60)

## 2011-11-17 MED ORDER — IOHEXOL 300 MG/ML  SOLN
100.0000 mL | Freq: Once | INTRAMUSCULAR | Status: AC | PRN
  Administered 2011-11-17: 100 mL via INTRAVENOUS

## 2011-11-23 ENCOUNTER — Telehealth: Payer: Self-pay | Admitting: Internal Medicine

## 2011-11-23 ENCOUNTER — Ambulatory Visit (HOSPITAL_BASED_OUTPATIENT_CLINIC_OR_DEPARTMENT_OTHER): Payer: Medicare Other | Admitting: Internal Medicine

## 2011-11-23 VITALS — BP 152/75 | HR 103 | Temp 97.5°F | Ht 64.0 in | Wt 118.9 lb

## 2011-11-23 DIAGNOSIS — C3410 Malignant neoplasm of upper lobe, unspecified bronchus or lung: Secondary | ICD-10-CM

## 2011-11-23 DIAGNOSIS — C3490 Malignant neoplasm of unspecified part of unspecified bronchus or lung: Secondary | ICD-10-CM

## 2011-11-23 NOTE — Progress Notes (Signed)
[facility] Telephone:([phone]) [phone]   [phone]  OFFICE PROGRESS NOTE  Garlan Fillers, MD, MD [address] [facility], [place]. [place] [address]  DIAGNOSIS: Limited stage small cell lung cancer diagnosed in October of 2012 .   PRIOR THERAPY:  1) Status post palliative radiotherapy to the right hilum under the care Dr. Dayton Scrape.  2) Systemic chemotherapy with carboplatin for AUC of 5 on day 1 and etoposide 120 mg/M2 on days 1,2 and 3 with Neulasta support on day 4, status post 6 cycles. 3) Prophylactic cranial irradiation under the care of Dr. Dayton Scrape.  CURRENT THERAPY: Observation.   INTERVAL HISTORY: Janice Daniels 66 y.o. female returns to the clinic today for two-month followup visit. The patient is feeling fine today with no specific complaints. She tolerated the prophylactic cranial irradiation fairly well. She denied having any significant chest pain or shortness breath, no cough or hemoptysis. No weight loss or night sweats. She has repeat CT scan of the chest performed recently and she is here today for evaluation and discussion of her scan results.  MEDICAL HISTORY: Past Medical History  Diagnosis Date  . Hypertension   . Hypothyroid   . GERD (gastroesophageal reflux disease)   . COPD (chronic obstructive pulmonary disease)   . Low back pain   . Lung cancer 05/09/2011    RUL  . Depression   . History of radiation therapy 06/05/11 to 07/18/11    lung  . History of radiation therapy 09/13/11 to 09/26/11    brain    ALLERGIES:  is allergic to codeine.  MEDICATIONS:  Current Outpatient Prescriptions  Medication Sig Dispense Refill  . acetaminophen (TYLENOL) 500 MG tablet Take 500 mg by mouth every 6 (six) hours as needed.        Marland Kitchen albuterol (PROVENTIL) (2.5 MG/3ML) 0.083% nebulizer solution Take 2.5 mg by nebulization every 4 (four) hours as needed.        Marland Kitchen albuterol (VENTOLIN HFA) 108 (90 BASE) MCG/ACT inhaler  Inhale 2 puffs into the lungs every 6 (six) hours as needed.        . benazepril-hydrochlorthiazide (LOTENSIN HCT) 10-12.5 MG per tablet Take 1 tablet by mouth daily as needed.       . citalopram (CELEXA) 10 MG tablet Take 10 mg by mouth daily.        Marland Kitchen docusate sodium (COLACE) 100 MG capsule Take 100 mg by mouth 2 (two) times daily as needed.       Marland Kitchen levothyroxine (SYNTHROID, LEVOTHROID) 112 MCG tablet Take 112 mcg by mouth daily.        Marland Kitchen loratadine (CLARITIN) 10 MG tablet Take 10 mg by mouth daily. prn       . Multiple Vitamin (MULTIVITAMIN) tablet Take 1 tablet by mouth daily.        . prochlorperazine (COMPAZINE) 10 MG tablet Take 10 mg by mouth every 6 (six) hours as needed.        . tiotropium (SPIRIVA) 18 MCG inhalation capsule Place 18 mcg into inhaler and inhale daily as needed.        SURGICAL HISTORY:  Past Surgical History  Procedure Date  . Tubal ligation 1984  . Total abdominal hysterectomy 1986  . Tonsillectomy     REVIEW OF SYSTEMS:  A comprehensive review of systems was negative.   PHYSICAL EXAMINATION: General appearance: alert, cooperative and no distress Head: Normocephalic, without obvious abnormality, atraumatic Neck: no adenopathy Lymph nodes: Cervical,  supraclavicular, and axillary nodes normal. Resp: clear to auscultation bilaterally Cardio: regular rate and rhythm, S1, S2 normal, no murmur, click, rub or gallop GI: soft, non-tender; bowel sounds normal; no masses,  no organomegaly Extremities: extremities normal, atraumatic, no cyanosis or edema Neurologic: Alert and oriented X 3, normal strength and tone. Normal symmetric reflexes. Normal coordination and gait  ECOG PERFORMANCE STATUS: 0 - Asymptomatic  Blood pressure 152/75, pulse 103, temperature 97.5 F (36.4 C), temperature source Oral, height 5\' 4"  (1.626 m), weight 118 lb 14.4 oz (53.933 kg).  LABORATORY DATA: Lab Results  Component Value Date   WBC 3.9 11/17/2011   HGB 13.0 11/17/2011   HCT  38.7 11/17/2011   MCV 99.2 11/17/2011   PLT 246 11/17/2011      Chemistry      Component Value Date/Time   NA 143 11/17/2011 0847   NA 139 09/19/2011 1027   K 4.6 11/17/2011 0847   K 4.2 09/19/2011 1027   CL 101 11/17/2011 0847   CL 105 09/19/2011 1027   CO2 30 11/17/2011 0847   CO2 28 09/19/2011 1027   BUN 13 11/17/2011 0847   BUN 15 09/19/2011 1027   CREATININE 0.8 11/17/2011 0847   CREATININE 0.75 09/19/2011 1027      Component Value Date/Time   CALCIUM 9.0 11/17/2011 0847   CALCIUM 8.9 09/19/2011 1027   ALKPHOS 60 11/17/2011 0847   ALKPHOS 65 09/19/2011 1027   AST 23 11/17/2011 0847   AST 12 09/19/2011 1027   ALT <8 09/19/2011 1027   BILITOT 0.60 11/17/2011 0847   BILITOT 0.3 09/19/2011 1027       RADIOGRAPHIC STUDIES: Ct Chest W Contrast  11/17/2011  *RADIOLOGY REPORT*  Clinical Data: History of lung cancer status post chemotherapy and radiation therapy.  The restaging scan.  CT CHEST WITH CONTRAST  Technique:  Multidetector CT imaging of the chest was performed following the standard protocol during bolus administration of intravenous contrast.  Contrast: OMNIPAQUE IOHEXOL 300 MG/ML  SOLN  Comparison: CTA thorax 09/15/2011.  Findings:  Mediastinum: Heart size is normal. There is no significant pericardial fluid, thickening or pericardial calcification. There is atherosclerosis of the thoracic aorta, the great vessels of the mediastinum and the coronary arteries, including calcified atherosclerotic plaque in the left anterior descending and right coronary arteries. No pathologically enlarged mediastinal lymph nodes. Prominent nodal tissue in the right hilar region appears similar to the prior examination. No enlarged left hilar lymph nodes are noted.  The esophagus is unremarkable in appearance. No acute abnormality of the thoracic aorta; specifically, no aneurysm or dissection.  Lungs/Pleura: Compared to the prior study from 09/15/2011 the previously noted mass-like area of airspace  consolidation in the right upper lobe is less well defined.  There continue to be some patchy air space and interstitial opacities with extensive architectural distortion throughout the right upper lobe and superior segment of the right lower lobe, with some lateral pleural retraction and distortion of the major fissure, all of which is favored at this time to represent evolving postradiation changes. The nodular areas of peribronchovascular ground-glass attenuation scattered throughout the lung bases seen on the prior examination have largely resolved, suggesting that they were of infectious or inflammatory etiology on the prior study.  There is again a background of mild paraseptal emphysema and mild diffuse bronchial wall thickening.  Upper Abdomen: Atherosclerotic calcifications throughout the visualized vasculature.  Musculoskeletal: There are no aggressive appearing lytic or blastic lesions noted in the visualized portions of  the skeleton.  IMPRESSION: 1.  Continued evolution of postradiation changes in the right upper lobe and superior segment of the right lower lobe, as detailed above, without definitive evidence to suggest residual disease on today's examination.  2.  Interval resolution of previously noted bibasilar areas of peribronchovascular ground-glass attenuation nodularity, suggesting that these findings were of infectious or inflammatory etiology on the prior examination. 3.  Atherosclerosis, including two-vessel coronary artery disease. Please note that although the presence of coronary artery calcium documents the presence of coronary artery disease, the severity of this disease and any potential stenosis cannot be assessed on this non-gated CT examination.  Assessment for potential risk factor modification, dietary therapy or pharmacologic therapy may be warranted, if clinically indicated. 4.  Mild paraseptal emphysema and mild diffuse bronchial wall thickening again noted.  Original Report  Authenticated By: Florencia Reasons, M.D.    ASSESSMENT: This is a very pleasant 66 years old white female with history of limited stage small cell lung cancer status post systemic chemotherapy with carboplatin and etoposide concurrent with radiation as well as prophylactic cranial irradiation. The patient is doing fine and she has no evidence for disease recurrence.  PLAN: I discussed the scan results with the patient and recommended for her continuous observation for now. I would see her back for followup visit in 3 months with repeat CT scan of the chest with contrast.  She was advised to call me immediately if she has any concerning symptoms in the interval.  All questions were answered. The patient knows to call the clinic with any problems, questions or concerns. We can certainly see the patient much sooner if necessary.

## 2011-11-23 NOTE — Telephone Encounter (Signed)
Gv pt appt for aug2013.  scheduled ct scan for 08/27 @ [facility]

## 2012-02-13 ENCOUNTER — Telehealth: Payer: Self-pay | Admitting: Internal Medicine

## 2012-02-13 NOTE — Telephone Encounter (Signed)
l/m for pt that 8/29 appt had been cx and r/.s to 9/3 due to mkm not being in office

## 2012-02-20 ENCOUNTER — Ambulatory Visit (HOSPITAL_COMMUNITY)
Admission: RE | Admit: 2012-02-20 | Discharge: 2012-02-20 | Disposition: A | Discharge status: Home / Self Care | Payer: Medicare Other | Source: Ambulatory Visit | Attending: Internal Medicine | Admitting: Internal Medicine

## 2012-02-20 ENCOUNTER — Other Ambulatory Visit (HOSPITAL_BASED_OUTPATIENT_CLINIC_OR_DEPARTMENT_OTHER): Payer: Medicare Other

## 2012-02-20 DIAGNOSIS — C3490 Malignant neoplasm of unspecified part of unspecified bronchus or lung: Secondary | ICD-10-CM

## 2012-02-20 DIAGNOSIS — C7931 Secondary malignant neoplasm of brain: Secondary | ICD-10-CM | POA: Insufficient documentation

## 2012-02-20 LAB — CBC WITH DIFFERENTIAL/PLATELET
BASO%: 1.3 % (ref 0.0–2.0)
HCT: 39.1 % (ref 34.8–46.6)
LYMPH%: 12.9 % — ABNORMAL LOW (ref 14.0–49.7)
MCH: 32.3 pg (ref 25.1–34.0)
MCHC: 33.4 g/dL (ref 31.5–36.0)
MCV: 96.8 fL (ref 79.5–101.0)
MONO#: 0.6 10*3/uL (ref 0.1–0.9)
NEUT%: 70.1 % (ref 38.4–76.8)
Platelets: 226 10*3/uL (ref 145–400)
WBC: 4.1 10*3/uL (ref 3.9–10.3)

## 2012-02-20 LAB — COMPREHENSIVE METABOLIC PANEL (CC13)
CO2: 26 mEq/L (ref 22–29)
Creatinine: 0.8 mg/dL (ref 0.6–1.1)
Total Bilirubin: 0.8 mg/dL (ref 0.20–1.20)
Total Protein: 6.7 g/dL (ref 6.4–8.3)

## 2012-02-20 MED ORDER — IOHEXOL 300 MG/ML  SOLN
80.0000 mL | Freq: Once | INTRAMUSCULAR | Status: AC | PRN
  Administered 2012-02-20: 80 mL via INTRAVENOUS

## 2012-02-22 ENCOUNTER — Ambulatory Visit: Payer: Medicare Other | Admitting: Internal Medicine

## 2012-02-27 ENCOUNTER — Telehealth: Payer: Self-pay | Admitting: Internal Medicine

## 2012-02-27 ENCOUNTER — Ambulatory Visit (HOSPITAL_BASED_OUTPATIENT_CLINIC_OR_DEPARTMENT_OTHER): Payer: Medicare Other | Admitting: Internal Medicine

## 2012-02-27 VITALS — BP 144/87 | HR 93 | Temp 98.4°F | Resp 20 | Ht 64.0 in | Wt 115.2 lb

## 2012-02-27 DIAGNOSIS — C3410 Malignant neoplasm of upper lobe, unspecified bronchus or lung: Secondary | ICD-10-CM

## 2012-02-27 DIAGNOSIS — C3490 Malignant neoplasm of unspecified part of unspecified bronchus or lung: Secondary | ICD-10-CM

## 2012-02-27 NOTE — Telephone Encounter (Signed)
appts made and printed for pt aom °

## 2012-02-27 NOTE — Patient Instructions (Signed)
Your scan showed no evidence for progression of the cancer Continue on observation Followup in 3 months with repeat CT scan of the chest

## 2012-02-27 NOTE — Progress Notes (Signed)
[facility] [phone]   Fax:([phone]  OFFICE PROGRESS NOTE  Garlan Fillers, MD [address] [facility], [place]. [place] [address]  DIAGNOSIS: Limited stage small cell lung cancer diagnosed in October of 2012 .   PRIOR THERAPY:  1) Status post palliative radiotherapy to the right hilum under the care Dr. Dayton Scrape.  2) Systemic chemotherapy with carboplatin for AUC of 5 on day 1 and etoposide 120 mg/M2 on days 1,2 and 3 with Neulasta support on day 4, status post 6 cycles.  3) Prophylactic cranial irradiation under the care of Dr. Dayton Scrape.   CURRENT THERAPY: Observation.  INTERVAL HISTORY: Janice Daniels 66 y.o. female returns to the clinic today for routine three-month followup visit. The patient is feeling fine today with no specific complaints. She denied having any significant chest pain, shortness breath, cough or hemoptysis. She has no significant weight loss or night sweats. The patient has repeat CT scan of the chest performed recently and she is here today for evaluation and discussion of her scan results.   MEDICAL HISTORY: Past Medical History  Diagnosis Date  . Hypertension   . Hypothyroid   . GERD (gastroesophageal reflux disease)   . COPD (chronic obstructive pulmonary disease)   . Low back pain   . Lung cancer 05/09/2011    RUL  . Depression   . History of radiation therapy 06/05/11 to 07/18/11    lung  . History of radiation therapy 09/13/11 to 09/26/11    brain    ALLERGIES:  is allergic to codeine.  MEDICATIONS:  Current Outpatient Prescriptions  Medication Sig Dispense Refill  . acetaminophen (TYLENOL) 500 MG tablet Take 500 mg by mouth every 6 (six) hours as needed.        Marland Kitchen albuterol (PROVENTIL) (2.5 MG/3ML) 0.083% nebulizer solution Take 2.5 mg by nebulization every 4 (four) hours as needed.        Marland Kitchen albuterol (VENTOLIN HFA) 108 (90 BASE) MCG/ACT inhaler Inhale 2 puffs into the lungs  every 6 (six) hours as needed.        . citalopram (CELEXA) 10 MG tablet Take 10 mg by mouth daily.        Marland Kitchen docusate sodium (COLACE) 100 MG capsule Take 100 mg by mouth 2 (two) times daily as needed.       Marland Kitchen levothyroxine (SYNTHROID, LEVOTHROID) 112 MCG tablet Take 112 mcg by mouth daily.        Marland Kitchen loratadine (CLARITIN) 10 MG tablet Take 10 mg by mouth daily. prn       . Multiple Vitamin (MULTIVITAMIN) tablet Take 1 tablet by mouth daily.        . prochlorperazine (COMPAZINE) 10 MG tablet Take 10 mg by mouth every 6 (six) hours as needed.        . tiotropium (SPIRIVA) 18 MCG inhalation capsule Place 18 mcg into inhaler and inhale daily as needed.      . benazepril-hydrochlorthiazide (LOTENSIN HCT) 10-12.5 MG per tablet Take 1 tablet by mouth daily as needed.         SURGICAL HISTORY:  Past Surgical History  Procedure Date  . Tubal ligation 1984  . Total abdominal hysterectomy 1986  . Tonsillectomy     REVIEW OF SYSTEMS:  A comprehensive review of systems was negative.   PHYSICAL EXAMINATION: General appearance: alert, cooperative and no distress Head: Normocephalic, without obvious abnormality, atraumatic Neck: no adenopathy Lymph nodes: Cervical, supraclavicular, and axillary nodes  normal. Resp: clear to auscultation bilaterally Cardio: regular rate and rhythm, S1, S2 normal, no murmur, click, rub or gallop GI: soft, non-tender; bowel sounds normal; no masses,  no organomegaly Extremities: extremities normal, atraumatic, no cyanosis or edema Neurologic: Alert and oriented X 3, normal strength and tone. Normal symmetric reflexes. Normal coordination and gait  ECOG PERFORMANCE STATUS: 0 - Asymptomatic  Blood pressure 144/87, pulse 93, temperature 98.4 F (36.9 C), temperature source Oral, resp. rate 20, height 5\' 4"  (1.626 m), weight 115 lb 3.2 oz (52.254 kg).  LABORATORY DATA: Lab Results  Component Value Date   WBC 4.1 02/20/2012   HGB 13.1 02/20/2012   HCT 39.1 02/20/2012    MCV 96.8 02/20/2012   PLT 226 02/20/2012      Chemistry      Component Value Date/Time   NA 140 02/20/2012 0919   NA 143 11/17/2011 0847   NA 139 09/19/2011 1027   K 3.9 02/20/2012 0919   K 4.6 11/17/2011 0847   K 4.2 09/19/2011 1027   CL 107 02/20/2012 0919   CL 101 11/17/2011 0847   CL 105 09/19/2011 1027   CO2 26 02/20/2012 0919   CO2 30 11/17/2011 0847   CO2 28 09/19/2011 1027   BUN 12.0 02/20/2012 0919   BUN 13 11/17/2011 0847   BUN 15 09/19/2011 1027   CREATININE 0.8 02/20/2012 0919   CREATININE 0.8 11/17/2011 0847   CREATININE 0.75 09/19/2011 1027      Component Value Date/Time   CALCIUM 8.9 02/20/2012 0919   CALCIUM 9.0 11/17/2011 0847   CALCIUM 8.9 09/19/2011 1027   ALKPHOS 57 02/20/2012 0919   ALKPHOS 60 11/17/2011 0847   ALKPHOS 65 09/19/2011 1027   AST 31 02/20/2012 0919   AST 23 11/17/2011 0847   AST 12 09/19/2011 1027   ALT 27 02/20/2012 0919   ALT <8 09/19/2011 1027   BILITOT 0.80 02/20/2012 0919   BILITOT 0.60 11/17/2011 0847   BILITOT 0.3 09/19/2011 1027       RADIOGRAPHIC STUDIES: Ct Chest W Contrast  02/20/2012  *RADIOLOGY REPORT*  Clinical Data: Lung cancer with prior brain metastasis.  CT CHEST WITH CONTRAST  Technique:  Multidetector CT imaging of the chest was performed following the standard protocol during bolus administration of intravenous contrast.  Contrast: 80mL OMNIPAQUE IOHEXOL 300 MG/ML  SOLN  Comparison: CT 11/17/2011  Findings: Within the right upper lobe there is linear nodular thickening with associated bronchiectasis which is not significantly changed from prior (image 24, series 5). The linear nodular thickening extends to the right hilum.  There is increase in loculated pleural fluid versus pleural thickening in the posterior superior aspect of the right lower lobe (image 23, series 2).  No evidence of mediastinal lymphadenopathy.  No pericardial fluid. Esophagus is normal.  No axillary supraclavicular lymphadenopathy.  Limited view of the upper abdomen  demonstrates normal adrenal glands.  No focal hepatic lesion is present.  Limited view of the skeleton is unremarkable.  IMPRESSION:  1.  Stable linear nodular thickening in the right upper lobe is likely benign post therapy change.  Attention on follow-up. 2.  Increase in pleural fluid which is loculated in the superior aspect the right hemithorax is also likely related to therapy. 3.  No clear evidence of disease progression.   Original Report Authenticated By: Genevive Bi, M.D.     ASSESSMENT: This is a very pleasant 66 years old white female with history of limited stage small cell lung cancer status post  systemic chemotherapy with carboplatin and etoposide with concurrent radiotherapy as well as prophylactic cranial irradiation. The patient is doing fine and has no evidence for disease recurrence on the recent scan.  PLAN: I discussed the scan results with the patient and recommended for her continuous observation for now with repeat CT scan of the chest with contrast in 3 months.  She was advised to call me immediately if she has any concerning symptoms in the interval.  All questions were answered. The patient knows to call the clinic with any problems, questions or concerns. We can certainly see the patient much sooner if necessary.

## 2012-03-29 ENCOUNTER — Encounter: Payer: Self-pay | Admitting: Internal Medicine

## 2012-03-29 NOTE — Progress Notes (Signed)
Returned Janice Daniels's call. Discount expired 05/21/12, Janice Daniels will be coming in on 05/20/12 to re-apply. Advised to bring her most current (30 days) income and bank statement.

## 2012-04-09 ENCOUNTER — Encounter: Payer: Self-pay | Admitting: Pulmonary Disease

## 2012-04-09 ENCOUNTER — Ambulatory Visit (INDEPENDENT_AMBULATORY_CARE_PROVIDER_SITE_OTHER): Payer: Medicare Other | Admitting: Pulmonary Disease

## 2012-04-09 VITALS — BP 104/70 | HR 98 | Temp 98.0°F | Ht 64.0 in | Wt 115.0 lb

## 2012-04-09 DIAGNOSIS — J449 Chronic obstructive pulmonary disease, unspecified: Secondary | ICD-10-CM

## 2012-04-09 NOTE — Progress Notes (Signed)
  Subjective:    Patient ID: Janice Daniels, female    DOB: 05-21-1946, 66 y.o.   MRN: [id number]  HPI The patient comes in today for followup of her known COPD.  She has been staying on Spiriva compliantly, and feels that it does help her breathing.  She has not required frequent rescue inhaler use.  She had what sounds like an upper respiratory infection 2 weeks ago, but is now back to baseline.  Overall, she feels that she is doing well, but admits that her lower extremities are weak.   Review of Systems  Constitutional: Negative for fever, chills, diaphoresis, activity change, appetite change, fatigue and unexpected weight change.  HENT: Negative for ear pain, nosebleeds, congestion, sore throat, facial swelling, rhinorrhea, sneezing, trouble swallowing, neck pain, neck stiffness, voice change, postnasal drip, sinus pressure, tinnitus and ear discharge.   Eyes: Negative for visual disturbance.  Respiratory: Positive for cough. Negative for choking, chest tightness, shortness of breath and wheezing.   Cardiovascular: Negative for chest pain, palpitations and leg swelling.  Gastrointestinal: Negative for nausea, vomiting, abdominal pain, constipation and blood in stool.  Genitourinary: Negative for difficulty urinating.  Musculoskeletal: Negative for back pain, joint swelling and gait problem.  Skin: Negative for rash.  Neurological: Negative for dizziness, tremors, seizures, syncope, speech difficulty, weakness, light-headedness, numbness and headaches.  Hematological: Does not bruise/bleed easily.  Psychiatric/Behavioral: Negative for confusion and agitation. The patient is not nervous/anxious.        Objective:   Physical Exam Thin female in no acute distress Nose without purulence or discharge noted Chest with decreased breath sounds, but no wheezes or rhonchi Cardiac exam is regular rate and rhythm Lower extremities without edema, cyanosis alert and oriented, moves all 4  extremities.       Assessment & Plan:

## 2012-04-09 NOTE — Patient Instructions (Addendum)
No change in current medications Try and stay as active as possible, and will refer to pulmonary rehab program at [facility].  Will have the director call you to discuss the program and costs. followup with me in 6mos.

## 2012-04-09 NOTE — Assessment & Plan Note (Signed)
The patient is doing fairly well from a COPD standpoint.  She has not had an acute exacerbation since the last visit, and is not requiring frequent use of her rescue inhaler.  She does get fatigued easily with exertional activities, and blames leg weakness for this.  I think she would greatly benefit from a pulmonary rehabilitation program, and will refer the patient to discuss possible costs.  She is concerned that she cannot afford.  She is to continue her current bronchodilator regimen.

## 2012-05-27 ENCOUNTER — Encounter: Payer: Self-pay | Admitting: Internal Medicine

## 2012-05-27 NOTE — Progress Notes (Signed)
Patient came and I gave her a new application for assistance. Last expired 05/21/12. Advised her of all proof needed for income to send with the application.

## 2012-05-28 ENCOUNTER — Other Ambulatory Visit (HOSPITAL_BASED_OUTPATIENT_CLINIC_OR_DEPARTMENT_OTHER): Payer: Medicare Other

## 2012-05-28 ENCOUNTER — Ambulatory Visit (HOSPITAL_COMMUNITY)
Admission: RE | Admit: 2012-05-28 | Discharge: 2012-05-28 | Disposition: A | Discharge status: Home / Self Care | Payer: Medicare Other | Source: Ambulatory Visit | Attending: Internal Medicine | Admitting: Internal Medicine

## 2012-05-28 DIAGNOSIS — C3410 Malignant neoplasm of upper lobe, unspecified bronchus or lung: Secondary | ICD-10-CM

## 2012-05-28 DIAGNOSIS — C3490 Malignant neoplasm of unspecified part of unspecified bronchus or lung: Secondary | ICD-10-CM | POA: Insufficient documentation

## 2012-05-28 DIAGNOSIS — R911 Solitary pulmonary nodule: Secondary | ICD-10-CM | POA: Insufficient documentation

## 2012-05-28 LAB — CBC WITH DIFFERENTIAL/PLATELET
Basophils Absolute: 0 10*3/uL (ref 0.0–0.1)
Eosinophils Absolute: 0 10*3/uL (ref 0.0–0.5)
HCT: 40.3 % (ref 34.8–46.6)
HGB: 13.7 g/dL (ref 11.6–15.9)
LYMPH%: 15.7 % (ref 14.0–49.7)
MCV: 96.9 fL (ref 79.5–101.0)
MONO%: 13 % (ref 0.0–14.0)
NEUT#: 3.3 10*3/uL (ref 1.5–6.5)
NEUT%: 70 % (ref 38.4–76.8)
Platelets: 230 10*3/uL (ref 145–400)
RDW: 13.4 % (ref 11.2–14.5)

## 2012-05-28 MED ORDER — IOHEXOL 300 MG/ML  SOLN
80.0000 mL | Freq: Once | INTRAMUSCULAR | Status: AC | PRN
  Administered 2012-05-28: 80 mL via INTRAVENOUS

## 2012-05-30 ENCOUNTER — Telehealth: Payer: Self-pay | Admitting: Internal Medicine

## 2012-05-30 ENCOUNTER — Ambulatory Visit (HOSPITAL_BASED_OUTPATIENT_CLINIC_OR_DEPARTMENT_OTHER): Payer: Medicare Other | Admitting: Internal Medicine

## 2012-05-30 VITALS — BP 141/83 | HR 96 | Temp 97.9°F | Resp 20 | Ht 64.0 in | Wt 116.5 lb

## 2012-05-30 DIAGNOSIS — C3410 Malignant neoplasm of upper lobe, unspecified bronchus or lung: Secondary | ICD-10-CM

## 2012-05-30 DIAGNOSIS — C3490 Malignant neoplasm of unspecified part of unspecified bronchus or lung: Secondary | ICD-10-CM

## 2012-05-30 NOTE — Telephone Encounter (Signed)
Pt aware that central scheduling will contact her with d/t of ct

## 2012-05-30 NOTE — Patient Instructions (Signed)
No evidence for disease recurrence on the recent scan. Followup in 3 months with repeat CT scan of the chest

## 2012-05-30 NOTE — Progress Notes (Signed)
[facility] [phone]   Fax:([phone]  OFFICE PROGRESS NOTE  Garlan Fillers, MD [address] [facility], [place]. [place] [address]  DIAGNOSIS: Limited stage small cell lung cancer diagnosed in October of 2012 .   PRIOR THERAPY:  1) Status post palliative radiotherapy to the right hilum under the care Dr. Dayton Scrape.  2) Systemic chemotherapy with carboplatin for AUC of 5 on day 1 and etoposide 120 mg/M2 on days 1,2 and 3 with Neulasta support on day 4, status post 6 cycles.  3) Prophylactic cranial irradiation under the care of Dr. Dayton Scrape.   CURRENT THERAPY: Observation.  INTERVAL HISTORY: Janice Daniels 66 y.o. female returns to the clinic today for routine three-month followup visit. The patient is feeling fine today with no specific complaints. She denied having any significant weight loss or night sweats. She has no chest pain, shortness breath, cough or hemoptysis. The patient had repeat CT scan of the chest performed recently and she is here today for evaluation and discussion of her scan results.  MEDICAL HISTORY: Past Medical History  Diagnosis Date  . Hypertension   . Hypothyroid   . GERD (gastroesophageal reflux disease)   . COPD (chronic obstructive pulmonary disease)   . Low back pain   . Lung cancer 05/09/2011    RUL  . Depression   . History of radiation therapy 06/05/11 to 07/18/11    lung  . History of radiation therapy 09/13/11 to 09/26/11    brain    ALLERGIES:  is allergic to codeine.  MEDICATIONS:  Current Outpatient Prescriptions  Medication Sig Dispense Refill  . acetaminophen (TYLENOL) 500 MG tablet Take 500 mg by mouth every 6 (six) hours as needed.        Marland Kitchen albuterol (PROVENTIL) (2.5 MG/3ML) 0.083% nebulizer solution Take 2.5 mg by nebulization every 4 (four) hours as needed.        Marland Kitchen albuterol (VENTOLIN HFA) 108 (90 BASE) MCG/ACT inhaler Inhale 2 puffs into the lungs every 6 (six)  hours as needed.        . benazepril-hydrochlorthiazide (LOTENSIN HCT) 10-12.5 MG per tablet Take 1 tablet by mouth daily as needed.       . Cholecalciferol (VITAMIN D3 PO) Take 1 tablet by mouth every 30 (thirty) days.      . citalopram (CELEXA) 10 MG tablet Take 10 mg by mouth daily.        Marland Kitchen docusate sodium (COLACE) 100 MG capsule Take 100 mg by mouth 2 (two) times daily as needed.       Marland Kitchen levothyroxine (SYNTHROID, LEVOTHROID) 112 MCG tablet Take 112 mcg by mouth daily.        Marland Kitchen loratadine (CLARITIN) 10 MG tablet Take 10 mg by mouth daily. prn       . Multiple Vitamin (MULTIVITAMIN) tablet Take 1 tablet by mouth daily.        . prochlorperazine (COMPAZINE) 10 MG tablet Take 10 mg by mouth every 6 (six) hours as needed.        . tiotropium (SPIRIVA) 18 MCG inhalation capsule Place 18 mcg into inhaler and inhale daily as needed.        SURGICAL HISTORY:  Past Surgical History  Procedure Date  . Tubal ligation 1984  . Total abdominal hysterectomy 1986  . Tonsillectomy     REVIEW OF SYSTEMS:  A comprehensive review of systems was negative.   PHYSICAL EXAMINATION: General appearance: alert, cooperative and no  distress Head: Normocephalic, without obvious abnormality, atraumatic Neck: no adenopathy Resp: clear to auscultation bilaterally Cardio: regular rate and rhythm, S1, S2 normal, no murmur, click, rub or gallop GI: soft, non-tender; bowel sounds normal; no masses,  no organomegaly Extremities: extremities normal, atraumatic, no cyanosis or edema Neurologic: Alert and oriented X 3, normal strength and tone. Normal symmetric reflexes. Normal coordination and gait  ECOG PERFORMANCE STATUS: 0 - Asymptomatic  Blood pressure 141/83, pulse 96, temperature 97.9 F (36.6 C), temperature source Oral, resp. rate 20, height 5\' 4"  (1.626 m), weight 116 lb 8 oz (52.844 kg).  LABORATORY DATA: Lab Results  Component Value Date   WBC 4.7 05/28/2012   HGB 13.7 05/28/2012   HCT 40.3 05/28/2012     MCV 96.9 05/28/2012   PLT 230 05/28/2012      Chemistry      Component Value Date/Time   NA 140 05/28/2012 1023   NA 143 11/17/2011 0847   NA 139 09/19/2011 1027   K 4.7 05/28/2012 1023   K 4.6 11/17/2011 0847   K 4.2 09/19/2011 1027   CL 106 05/28/2012 1023   CL 101 11/17/2011 0847   CL 105 09/19/2011 1027   CO2 27 05/28/2012 1023   CO2 30 11/17/2011 0847   CO2 28 09/19/2011 1027   BUN 16.0 05/28/2012 1023   BUN 13 11/17/2011 0847   BUN 15 09/19/2011 1027   CREATININE 0.9 05/28/2012 1023   CREATININE 0.8 11/17/2011 0847   CREATININE 0.75 09/19/2011 1027      Component Value Date/Time   CALCIUM 9.2 05/28/2012 1023   CALCIUM 9.0 11/17/2011 0847   CALCIUM 8.9 09/19/2011 1027   ALKPHOS 60 05/28/2012 1023   ALKPHOS 60 11/17/2011 0847   ALKPHOS 65 09/19/2011 1027   AST 19 05/28/2012 1023   AST 23 11/17/2011 0847   AST 12 09/19/2011 1027   ALT 15 05/28/2012 1023   ALT <8 09/19/2011 1027   BILITOT 0.56 05/28/2012 1023   BILITOT 0.60 11/17/2011 0847   BILITOT 0.3 09/19/2011 1027       RADIOGRAPHIC STUDIES: Ct Chest W Contrast  05/28/2012  *RADIOLOGY REPORT*  Clinical Data: Restaging lung cancer  CT CHEST WITH CONTRAST  Technique:  Multidetector CT imaging of the chest was performed following the standard protocol during bolus administration of intravenous contrast.  Contrast: 80mL OMNIPAQUE IOHEXOL 300 MG/ML  SOLN  Comparison: 02/20/2012  Findings:  Lungs/pleura: There is a small amount of loculated pleural fluid along the posterior and medial aspect of the right lower lobe. Pulmonary nodule within the right upper lobe measures 8 mm, image 12.  Previously 6 mm.  Heart/Mediastinum: No enlarged axillary or supraclavicular adenopathy.  No mediastinal or hilar adenopathy.  The heart size is normal.  No pericardial effusion.  Upper abdomen: No abnormality identified.  Normal appearance of the adrenal glands.  Bones/Musculoskeletal:  Review of the visualized bony structures is unremarkable.  IMPRESSION:  1.   Pulmonary nodule within the right upper lobe is slightly increased in size from previous exam.  This now measures 8 mm. 2.  Stable changes of external beam radiation within the right midlung. 3.  Stable loculated pleural fluid within the medial and posterior aspect of the right lower lobe.   Original Report Authenticated By: Signa Kell, M.D.     ASSESSMENT: This is a very pleasant 66 years old white female with history of limited stage small cell lung cancer status post systemic chemotherapy concurrent with radiation followed by prophylactic irradiation.  The patient is doing fine and she has no evidence for disease recurrence, except for a slightly enlarging right upper lobe pulmonary nodule.  PLAN: I discussed the scan results and showed the images to the patient today. I recommended for her to continue on observation for now with repeat CT scan of the chest in 3 months. She was advised to call me immediately if she has any concerning symptoms in the interval.  All questions were answered. The patient knows to call the clinic with any problems, questions or concerns. We can certainly see the patient much sooner if necessary.

## 2012-05-30 NOTE — Telephone Encounter (Signed)
gv and printed pt appt schedule for March 2014 °

## 2012-08-29 ENCOUNTER — Other Ambulatory Visit (HOSPITAL_BASED_OUTPATIENT_CLINIC_OR_DEPARTMENT_OTHER): Payer: Medicare Other | Admitting: Lab

## 2012-08-29 ENCOUNTER — Ambulatory Visit (HOSPITAL_COMMUNITY)
Admission: RE | Admit: 2012-08-29 | Discharge: 2012-08-29 | Disposition: A | Discharge status: Home / Self Care | Payer: Medicare Other | Source: Ambulatory Visit | Attending: Internal Medicine | Admitting: Internal Medicine

## 2012-08-29 DIAGNOSIS — C3490 Malignant neoplasm of unspecified part of unspecified bronchus or lung: Secondary | ICD-10-CM

## 2012-08-29 DIAGNOSIS — C3410 Malignant neoplasm of upper lobe, unspecified bronchus or lung: Secondary | ICD-10-CM

## 2012-08-29 DIAGNOSIS — R911 Solitary pulmonary nodule: Secondary | ICD-10-CM | POA: Insufficient documentation

## 2012-08-29 LAB — COMPREHENSIVE METABOLIC PANEL (CC13)
Albumin: 3.6 g/dL (ref 3.5–5.0)
BUN: 11.9 mg/dL (ref 7.0–26.0)
CO2: 29 mEq/L (ref 22–29)
Calcium: 9.3 mg/dL (ref 8.4–10.4)
Glucose: 79 mg/dl (ref 70–99)
Potassium: 4.2 mEq/L (ref 3.5–5.1)
Sodium: 140 mEq/L (ref 136–145)
Total Protein: 7.3 g/dL (ref 6.4–8.3)

## 2012-08-29 LAB — CBC WITH DIFFERENTIAL/PLATELET
Basophils Absolute: 0 10*3/uL (ref 0.0–0.1)
Eosinophils Absolute: 0.1 10*3/uL (ref 0.0–0.5)
HGB: 13.2 g/dL (ref 11.6–15.9)
MCV: 98.1 fL (ref 79.5–101.0)
MONO#: 0.8 10*3/uL (ref 0.1–0.9)
NEUT#: 3.1 10*3/uL (ref 1.5–6.5)
Platelets: 230 10*3/uL (ref 145–400)
RBC: 3.95 10*6/uL (ref 3.70–5.45)
RDW: 12.8 % (ref 11.2–14.5)
WBC: 4.7 10*3/uL (ref 3.9–10.3)

## 2012-08-29 MED ORDER — IOHEXOL 300 MG/ML  SOLN
80.0000 mL | Freq: Once | INTRAMUSCULAR | Status: AC | PRN
  Administered 2012-08-29: 80 mL via INTRAVENOUS

## 2012-09-02 ENCOUNTER — Telehealth: Payer: Self-pay | Admitting: Internal Medicine

## 2012-09-02 ENCOUNTER — Ambulatory Visit: Payer: Medicare Other | Admitting: Internal Medicine

## 2012-09-02 ENCOUNTER — Encounter: Payer: Self-pay | Admitting: Internal Medicine

## 2012-09-02 ENCOUNTER — Ambulatory Visit (HOSPITAL_BASED_OUTPATIENT_CLINIC_OR_DEPARTMENT_OTHER): Payer: Medicare Other | Admitting: Internal Medicine

## 2012-09-02 DIAGNOSIS — C3410 Malignant neoplasm of upper lobe, unspecified bronchus or lung: Secondary | ICD-10-CM

## 2012-09-02 DIAGNOSIS — Z85118 Personal history of other malignant neoplasm of bronchus and lung: Secondary | ICD-10-CM

## 2012-09-02 HISTORY — DX: Malignant neoplasm of upper lobe, unspecified bronchus or lung: C34.10

## 2012-09-02 NOTE — Patient Instructions (Signed)
The right upper lobe nodule is slightly larger than before. I will order a PET scan for evaluation of this nodule. Followup visit in 2 weeks.

## 2012-09-02 NOTE — Progress Notes (Signed)
[facility] Telephone:([phone]   Fax:(336) [phone]  OFFICE PROGRESS NOTE  Garlan Fillers, MD [address] [facility], [place]. [place] [address]  DIAGNOSIS: Limited stage small cell lung cancer diagnosed in October of 2012 .   PRIOR THERAPY:  1) Status post palliative radiotherapy to the right hilum under the care Dr. Dayton Scrape.  2) Systemic chemotherapy with carboplatin for AUC of 5 on day 1 and etoposide 120 mg/M2 on days 1,2 and 3 with Neulasta support on day 4, status post 6 cycles.  3) Prophylactic cranial irradiation under the care of Dr. Dayton Scrape.   CURRENT THERAPY: Observation.  INTERVAL HISTORY: Janice Daniels 67 y.o. female returns to the clinic today for routine followup visit. The patient is feeling fine today with no specific complaints. She denied having any significant chest pain, shortness breath, cough or hemoptysis. She denied having any weight loss or night sweats. No neurological symptoms. The patient had repeat CT scan of the chest performed recently and she is here for evaluation and discussion of her scan results. She was recently diagnosed with shingles involving the the scan of the right lower rib cage anteriorly with radiation to the back which is very painful and she is currently on Neurontin 600 mg by mouth 3 times a day by her primary care physician Dr. Eloise Harman.   MEDICAL HISTORY: Past Medical History  Diagnosis Date  . Hypertension   . Hypothyroid   . GERD (gastroesophageal reflux disease)   . COPD (chronic obstructive pulmonary disease)   . Low back pain   . Lung cancer 05/09/2011    RUL  . Depression   . History of radiation therapy 06/05/11 to 07/18/11    lung  . History of radiation therapy 09/13/11 to 09/26/11    brain    ALLERGIES:  is allergic to codeine.  MEDICATIONS:  Current Outpatient Prescriptions  Medication Sig Dispense Refill  . acetaminophen (TYLENOL) 500 MG tablet Take 500 mg by  mouth every 6 (six) hours as needed.        Marland Kitchen albuterol (PROVENTIL) (2.5 MG/3ML) 0.083% nebulizer solution Take 2.5 mg by nebulization every 4 (four) hours as needed.        Marland Kitchen albuterol (VENTOLIN HFA) 108 (90 BASE) MCG/ACT inhaler Inhale 2 puffs into the lungs every 6 (six) hours as needed.        . Cholecalciferol (VITAMIN D3 PO) Take 1 tablet by mouth every 30 (thirty) days.      . citalopram (CELEXA) 10 MG tablet Take 10 mg by mouth daily.        Marland Kitchen docusate sodium (COLACE) 100 MG capsule Take 100 mg by mouth 2 (two) times daily as needed.       Marland Kitchen levothyroxine (SYNTHROID, LEVOTHROID) 112 MCG tablet Take 112 mcg by mouth daily.        Marland Kitchen loratadine (CLARITIN) 10 MG tablet Take 10 mg by mouth daily. prn       . Multiple Vitamin (MULTIVITAMIN) tablet Take 1 tablet by mouth daily.        . prochlorperazine (COMPAZINE) 10 MG tablet Take 10 mg by mouth every 6 (six) hours as needed.        . tiotropium (SPIRIVA) 18 MCG inhalation capsule Place 18 mcg into inhaler and inhale daily as needed.       No current facility-administered medications for this visit.    SURGICAL HISTORY:  Past Surgical History  Procedure Laterality Date  .  Tubal ligation  1984  . Total abdominal hysterectomy  1986  . Tonsillectomy      REVIEW OF SYSTEMS:  A comprehensive review of systems was negative.   PHYSICAL EXAMINATION: General appearance: alert, cooperative and no distress Head: Normocephalic, without obvious abnormality, atraumatic Neck: no adenopathy Lymph nodes: Cervical, supraclavicular, and axillary nodes normal. Resp: clear to auscultation bilaterally Cardio: regular rate and rhythm, S1, S2 normal, no murmur, click, rub or gallop GI: soft, non-tender; bowel sounds normal; no masses,  no organomegaly Extremities: extremities normal, atraumatic, no cyanosis or edema  ECOG PERFORMANCE STATUS: 0 - Asymptomatic  Blood pressure 134/67, pulse 102, temperature 97.2 F (36.2 C), temperature source Oral,  resp. rate 20, height 5\' 4"  (1.626 m), weight 121 lb 14.4 oz (55.293 kg).  LABORATORY DATA: Lab Results  Component Value Date   WBC 4.7 08/29/2012   HGB 13.2 08/29/2012   HCT 38.7 08/29/2012   MCV 98.1 08/29/2012   PLT 230 08/29/2012      Chemistry      Component Value Date/Time   NA 140 08/29/2012 0955   NA 143 11/17/2011 0847   NA 139 09/19/2011 1027   K 4.2 08/29/2012 0955   K 4.6 11/17/2011 0847   K 4.2 09/19/2011 1027   CL 103 08/29/2012 0955   CL 101 11/17/2011 0847   CL 105 09/19/2011 1027   CO2 29 08/29/2012 0955   CO2 30 11/17/2011 0847   CO2 28 09/19/2011 1027   BUN 11.9 08/29/2012 0955   BUN 13 11/17/2011 0847   BUN 15 09/19/2011 1027   CREATININE 0.8 08/29/2012 0955   CREATININE 0.8 11/17/2011 0847   CREATININE 0.75 09/19/2011 1027      Component Value Date/Time   CALCIUM 9.3 08/29/2012 0955   CALCIUM 9.0 11/17/2011 0847   CALCIUM 8.9 09/19/2011 1027   ALKPHOS 64 08/29/2012 0955   ALKPHOS 60 11/17/2011 0847   ALKPHOS 65 09/19/2011 1027   AST 15 08/29/2012 0955   AST 23 11/17/2011 0847   AST 12 09/19/2011 1027   ALT 10 08/29/2012 0955   ALT <8 09/19/2011 1027   BILITOT 0.52 08/29/2012 0955   BILITOT 0.60 11/17/2011 0847   BILITOT 0.3 09/19/2011 1027       RADIOGRAPHIC STUDIES: Ct Chest W Contrast  08/29/2012  *RADIOLOGY REPORT*  Clinical Data: Follow-up lung carcinoma.  Complete chemotherapy and radiation therapy.  CT CHEST WITH CONTRAST  Technique:  Multidetector CT imaging of the chest was performed following the standard protocol during bolus administration of intravenous contrast.  Contrast: 80mL OMNIPAQUE IOHEXOL 300 MG/ML  SOLN  Comparison: 05/28/2012  Findings: Postradiation changes in the right hemithorax are stable in appearance.  An ill-defined nodular density in the right upper lobe on image 12 is mildly increased in size, now measuring 11 mm compared 8 mm on previous study.  This is suspicious for a small bronchogenic carcinoma.  No other suspicious pulmonary nodules or masses are identified.   There is no evidence of acute infiltrate.  No evidence of pleural or pericardial effusion.  No mediastinal or hilar lymphadenopathy identified.  No adenopathy seen elsewhere in the thorax.  Both adrenal glands are normal in appearance.  No evidence of chest wall soft tissue mass or suspicious bone lesions.  IMPRESSION:  1.  Mild increase in size of the spiculated nodule in the right upper lobe now measuring 11 mm.  This is suspicious for a small bronchogenic carcinoma.  Consider PET CT for further evaluation. 2.  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 3.  No lymphadenopathy or pleural effusion.   Original Report Authenticated By: Myles Rosenthal, M.D.     ASSESSMENT: This is a very pleasant 67 years old white female with history of limited stage small cell lung cancer status post palliative radiotherapy to the right hilum followed by 6 cycles of systemic chemotherapy with carboplatin and etoposide followed by prophylactic cranial irradiation and currently on observation. The patient has enlarging spiculated nodule in the right upper lobe concerning for bronchogenic carcinoma.  PLAN: I discussed the scan results and showed the images to the patient today. I recommended for her to have a PET scan for evaluation of this lesion. I would see her back for followup visit in 2 weeks for evaluation and discussion of her scan results and further recommendation regarding management of this nodule. She was advised to call immediately if she has any concerning symptoms in the interval.  All questions were answered. The patient knows to call the clinic with any problems, questions or concerns. We can certainly see the patient much sooner if necessary.

## 2012-09-02 NOTE — Telephone Encounter (Signed)
Gave pt appt for Pet scan appt for 09/06/12 and see MD after 2 weeks

## 2012-09-06 ENCOUNTER — Ambulatory Visit (HOSPITAL_COMMUNITY)
Admission: RE | Admit: 2012-09-06 | Discharge: 2012-09-06 | Disposition: A | Discharge status: Home / Self Care | Payer: Medicare Other | Source: Ambulatory Visit | Attending: Internal Medicine | Admitting: Internal Medicine

## 2012-09-06 DIAGNOSIS — R911 Solitary pulmonary nodule: Secondary | ICD-10-CM | POA: Insufficient documentation

## 2012-09-06 DIAGNOSIS — C3490 Malignant neoplasm of unspecified part of unspecified bronchus or lung: Secondary | ICD-10-CM | POA: Insufficient documentation

## 2012-09-06 DIAGNOSIS — Z923 Personal history of irradiation: Secondary | ICD-10-CM | POA: Insufficient documentation

## 2012-09-06 LAB — GLUCOSE, CAPILLARY: Glucose-Capillary: 85 mg/dL (ref 70–99)

## 2012-09-06 MED ORDER — FLUDEOXYGLUCOSE F - 18 (FDG) INJECTION
17.8000 | Freq: Once | INTRAVENOUS | Status: AC | PRN
  Administered 2012-09-06: 17.8 via INTRAVENOUS

## 2012-09-17 ENCOUNTER — Telehealth: Payer: Self-pay | Admitting: Internal Medicine

## 2012-09-17 ENCOUNTER — Encounter: Payer: Self-pay | Admitting: Internal Medicine

## 2012-09-17 ENCOUNTER — Ambulatory Visit (HOSPITAL_BASED_OUTPATIENT_CLINIC_OR_DEPARTMENT_OTHER): Payer: Medicare Other | Admitting: Internal Medicine

## 2012-09-17 DIAGNOSIS — R911 Solitary pulmonary nodule: Secondary | ICD-10-CM

## 2012-09-17 DIAGNOSIS — C3410 Malignant neoplasm of upper lobe, unspecified bronchus or lung: Secondary | ICD-10-CM

## 2012-09-17 NOTE — Telephone Encounter (Signed)
gv and printed appt schedule for pt for April and June....scheduled pt with Dr. Dayton Scrape on 4.4.14 @ 10:45am

## 2012-09-17 NOTE — Progress Notes (Signed)
[facility] [phone]   Fax:(336) [phone]  OFFICE PROGRESS NOTE  Garlan Fillers, MD [address] [facility], [place]. 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   PRIOR THERAPY:  1) Status post palliative radiotherapy to the right hilum under the care Dr. Dayton Scrape.  2) Systemic chemotherapy with carboplatin for AUC of 5 on day 1 and etoposide 120 mg/M2 on days 1,2 and 3 with Neulasta support on day 4, status post 6 cycles.  3) Prophylactic cranial irradiation under the care of Dr. Dayton Scrape.   CURRENT THERAPY: Observation.  INTERVAL HISTORY: Janice Daniels 67 y.o. female returns to the clinic today for followup visit accompanied by her sister. The patient is feeling fine today with no specific complaints. She denied having any significant weight loss or night sweats. She has no chest pain, shortness breath, cough or hemoptysis. The patient had repeat PET scan performed recently and she is here for evaluation and discussion of her scan results and treatment options.  MEDICAL HISTORY: Past Medical History  Diagnosis Date  . Hypertension   . Hypothyroid   . GERD (gastroesophageal reflux disease)   . COPD (chronic obstructive pulmonary disease)   . Low back pain   . Lung cancer 05/09/2011    RUL  . Depression   . History of radiation therapy 06/05/11 to 07/18/11    lung  . History of radiation therapy 09/13/11 to 09/26/11    brain    ALLERGIES:  is allergic to codeine.  MEDICATIONS:  Current Outpatient Prescriptions  Medication Sig Dispense Refill  . acetaminophen (TYLENOL) 500 MG tablet Take 500 mg by mouth every 6 (six) hours as needed.        Marland Kitchen albuterol (PROVENTIL) (2.5 MG/3ML) 0.083% nebulizer solution Take 2.5 mg by nebulization every 4 (four) hours as needed.        Marland Kitchen albuterol (VENTOLIN HFA) 108 (90 BASE) MCG/ACT inhaler Inhale 2 puffs into the lungs every 6  (six) hours as needed.        . citalopram (CELEXA) 10 MG tablet Take 10 mg by mouth daily.        Marland Kitchen docusate sodium (COLACE) 100 MG capsule Take 100 mg by mouth 2 (two) times daily as needed.       Marland Kitchen levothyroxine (SYNTHROID, LEVOTHROID) 112 MCG tablet Take 112 mcg by mouth daily.        Marland Kitchen loratadine (CLARITIN) 10 MG tablet Take 10 mg by mouth daily. prn       . montelukast (SINGULAIR) 10 MG tablet Take 10 mg by mouth daily.      . Multiple Vitamin (MULTIVITAMIN) tablet Take 1 tablet by mouth daily.        . prochlorperazine (COMPAZINE) 10 MG tablet Take 10 mg by mouth every 6 (six) hours as needed.        Marland Kitchen ADVAIR DISKUS 250-50 MCG/DOSE AEPB Inhale 1 puff into the lungs as needed.      . Cholecalciferol (VITAMIN D3 PO) Take 1 tablet by mouth every 30 (thirty) days.      Marland Kitchen tiotropium (SPIRIVA) 18 MCG inhalation capsule Place 18 mcg into inhaler and inhale daily as needed.       No current facility-administered medications for this visit.    SURGICAL HISTORY:  Past Surgical History  Procedure Laterality Date  . Tubal ligation  1984  . Total abdominal hysterectomy  1986  .  Tonsillectomy      REVIEW OF SYSTEMS:  A comprehensive review of systems was negative.   PHYSICAL EXAMINATION: General appearance: alert, cooperative and no distress Head: Normocephalic, without obvious abnormality, atraumatic Neck: no adenopathy Resp: clear to auscultation bilaterally Cardio: regular rate and rhythm, S1, S2 normal, no murmur, click, rub or gallop GI: soft, non-tender; bowel sounds normal; no masses,  no organomegaly Extremities: extremities normal, atraumatic, no cyanosis or edema  ECOG PERFORMANCE STATUS: 0 - Asymptomatic  Blood pressure 143/85, pulse 114, temperature 97.1 F (36.2 C), temperature source Oral, resp. rate 20, height 5\' 4"  (1.626 m), weight 121 lb 4.8 oz (55.021 kg).  LABORATORY DATA: Lab Results  Component Value Date   WBC 4.7 08/29/2012   HGB 13.2 08/29/2012   HCT 38.7  08/29/2012   MCV 98.1 08/29/2012   PLT 230 08/29/2012      Chemistry      Component Value Date/Time   NA 140 08/29/2012 0955   NA 143 11/17/2011 0847   NA 139 09/19/2011 1027   K 4.2 08/29/2012 0955   K 4.6 11/17/2011 0847   K 4.2 09/19/2011 1027   CL 103 08/29/2012 0955   CL 101 11/17/2011 0847   CL 105 09/19/2011 1027   CO2 29 08/29/2012 0955   CO2 30 11/17/2011 0847   CO2 28 09/19/2011 1027   BUN 11.9 08/29/2012 0955   BUN 13 11/17/2011 0847   BUN 15 09/19/2011 1027   CREATININE 0.8 08/29/2012 0955   CREATININE 0.8 11/17/2011 0847   CREATININE 0.75 09/19/2011 1027      Component Value Date/Time   CALCIUM 9.3 08/29/2012 0955   CALCIUM 9.0 11/17/2011 0847   CALCIUM 8.9 09/19/2011 1027   ALKPHOS 64 08/29/2012 0955   ALKPHOS 60 11/17/2011 0847   ALKPHOS 65 09/19/2011 1027   AST 15 08/29/2012 0955   AST 23 11/17/2011 0847   AST 12 09/19/2011 1027   ALT 10 08/29/2012 0955   ALT <8 09/19/2011 1027   BILITOT 0.52 08/29/2012 0955   BILITOT 0.60 11/17/2011 0847   BILITOT 0.3 09/19/2011 1027       RADIOGRAPHIC STUDIES: Ct Chest W Contrast  08/29/2012  *RADIOLOGY REPORT*  Clinical Data: Follow-up lung carcinoma.  Complete chemotherapy and radiation therapy.  CT CHEST WITH CONTRAST  Technique:  Multidetector CT imaging of the chest was performed following the standard protocol during bolus administration of intravenous contrast.  Contrast: 80mL OMNIPAQUE IOHEXOL 300 MG/ML  SOLN  Comparison: 05/28/2012  Findings: Postradiation changes in the right hemithorax are stable in appearance.  An ill-defined nodular density in the right upper lobe on image 12 is mildly increased in size, now measuring 11 mm compared 8 mm on previous study.  This is suspicious for a small bronchogenic carcinoma.  No other suspicious pulmonary nodules or masses are identified.  There is no evidence of acute infiltrate.  No evidence of pleural or pericardial effusion.  No mediastinal or hilar lymphadenopathy identified.  No adenopathy seen elsewhere in the  thorax.  Both adrenal glands are normal in appearance.  No evidence of chest wall soft tissue mass or suspicious bone lesions.  IMPRESSION:  1.  Mild increase in size of the spiculated nodule in the right upper lobe now measuring 11 mm.  This is suspicious for a small bronchogenic carcinoma.  Consider PET CT for further evaluation. 2.  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 3.  No lymphadenopathy or pleural effusion.   Original Report Authenticated By:  Myles Rosenthal, M.D.    Nm Pet Image Restag (ps) Skull Base To Thigh  09/06/2012  *RADIOLOGY REPORT*  Clinical Data: Subsequent treatment strategy for lung cancer.  NUCLEAR MEDICINE PET SKULL BASE TO THIGH  Fasting Blood Glucose:  85  Technique:  17.8 mCi F-18 FDG was injected intravenously. CT data was obtained and used for attenuation correction and anatomic localization only.  (This was not acquired as a diagnostic CT examination.) Additional exam technical data entered on technologist worksheet.  Comparison:  04/20/2011  Findings:  Neck: No hypermetabolic lymph nodes in the neck.  Chest:  Radiation change within the right mid lung is identified. There has been resolution of the right midlung perihilar mass and right hilar adenopathy.  SUV max within the right perihilar region now measures 3.2, image 91.  Previously the tumor within the right midlung had an SUV max equal to 10.8.  Enlarging pulmonary nodule within the right upper lobe now measures 1.2 cm and has an SUV max equal to 6.3.  Previously this nodule measured 9 mm and had an SUV max equal to 1.5.  No hypermetabolic mediastinal adenopathy.  Abdomen/Pelvis:  No abnormal hypermetabolic activity within the liver, pancreas, adrenal glands, or spleen.  No hypermetabolic lymph nodes in the abdomen or pelvis.  Skeleton:  No focal hypermetabolic activity to suggest skeletal metastasis.  IMPRESSION:  1.  Enlarging pulmonary nodule within the right upper lobe now measures 1.2 cm and has an SUV  max equal to 6.30. This is concerning for a small bronchogenic carcinoma.  2. Stable radiation change within the right lung.  3.  No evidence for distant metastatic disease.   Original Report Authenticated By: Signa Kell, M.D.     ASSESSMENT: This is a very pleasant 67 years old white female with history of limited stage small cell lung cancer status post systemic chemotherapy as well as palliative radiation and prophylactic cranial irradiation. CT scan of the chest as well as PET scan showed enlarging hypermetabolic pulmonary nodule within the right upper lobe highly suspicious for bronchogenic carcinoma.   PLAN: I discussed the PET scan with the patient and her sister today. I recommended for her to see Dr. Dayton Scrape for consideration of stereotactic radiotherapy to this lesion. I would see her back for followup visit in 3 months with repeat CT scan of the chest for evaluation of her disease after the treatment. The patient was advised to call immediately if she has any concerning symptoms in the interval. All questions were answered. The patient knows to call the clinic with any problems, questions or concerns. We can certainly see the patient much sooner if necessary.

## 2012-09-17 NOTE — Patient Instructions (Signed)
The PET scan showed hypermetabolism in the right upper lobe nodule concerning for bronchogenic carcinoma. Referral to Dr. Dayton Scrape for consideration of stereotactic radiotherapy to this lesion. Followup visit in 3 months with repeat CT scan of the chest.

## 2012-09-23 ENCOUNTER — Ambulatory Visit: Payer: Medicare Other | Admitting: Internal Medicine

## 2012-09-25 ENCOUNTER — Encounter: Payer: Self-pay | Admitting: Oncology

## 2012-09-26 ENCOUNTER — Ambulatory Visit
Admission: RE | Admit: 2012-09-26 | Discharge: 2012-09-26 | Disposition: A | Discharge status: Home / Self Care | Payer: Medicare Other | Source: Ambulatory Visit | Attending: Radiation Oncology | Admitting: Radiation Oncology

## 2012-09-26 ENCOUNTER — Encounter: Payer: Self-pay | Admitting: Radiation Oncology

## 2012-09-26 DIAGNOSIS — C3490 Malignant neoplasm of unspecified part of unspecified bronchus or lung: Secondary | ICD-10-CM | POA: Insufficient documentation

## 2012-09-26 DIAGNOSIS — Z510 Encounter for antineoplastic radiation therapy: Secondary | ICD-10-CM | POA: Insufficient documentation

## 2012-09-26 NOTE — Progress Notes (Signed)
Please see the Nurse Progress Note in the MD Initial Consult Encounter for this patient. 

## 2012-09-26 NOTE — Progress Notes (Addendum)
Janice Daniels here for a reconsult visit for lung cancer.  She does have pain occasionally in her right side/back due to shingles.  She denies fatigue.  She does have a cough and is bringing up clear sputum.  She does not have a pacemaker.

## 2012-09-26 NOTE — Progress Notes (Signed)
CC: Dr. Si Gaul, Dr. 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 N2 M0) small cell carcinoma the right lung. Status post prophylactic cranial irradiation, 2500 cGy in 10 sessions completed 09/26/2011  History: Janice Daniels is a pleasant 67 year old female who is kindly seen again today for the courtesy of Dr. Arbutus Ped for consideration of SBRT the management of her suspected non-small cell carcinoma of the right lung. She received chemoradiation for limited stage small cell carcinoma of the right lung . Her radiation therapy is as detailed above. A CT of the chest and subsequent PET scan showed an enlarging hypermetabolic pulmonary nodule within the right upper lobe suspicious for a bronchogenic carcinoma. Her PET scan was on 09/06/2012. This nodule has a SUV Max of 6.3. No evidence for regional or distant metastases. She is without respiratory difficulties. Her appetite is good and she is slowly gaining weight.  Physical examination: Alert and oriented. No respiratory distress. Filed Vitals:   09/26/12 1059  BP: 169/86  Pulse: 97  Temp: 97.7 F (36.5 C)   Head neck examination: Grossly unremarkable. Nodes: Without palpable cervical or supraclavicular lymphadenopathy. Chest: Lungs clear. Heart: Regular rate and rhythm. Back: Without spinal or CVA tenderness. Abdomen: Without hepatomegaly. Extremities: Without edema. Neurologic examination: Grossly nonfocal.  Laboratory data: Lab Results  Component Value Date   WBC 4.7 08/29/2012   HGB 13.2 08/29/2012   HCT 38.7 08/29/2012   MCV 98.1 08/29/2012   PLT 230 08/29/2012   CMP     Component Value Date/Time   NA 140 08/29/2012 0955   NA 143 11/17/2011 0847   NA 139 09/19/2011 1027   K 4.2  08/29/2012 0955   K 4.6 11/17/2011 0847   K 4.2 09/19/2011 1027   CL 103 08/29/2012 0955   CL 101 11/17/2011 0847   CL 105 09/19/2011 1027   CO2 29 08/29/2012 0955   CO2 30 11/17/2011 0847   CO2 28 09/19/2011 1027   GLUCOSE 79 08/29/2012 0955   GLUCOSE 99 11/17/2011 0847   GLUCOSE 70 09/19/2011 1027   BUN 11.9 08/29/2012 0955   BUN 13 11/17/2011 0847   BUN 15 09/19/2011 1027   CREATININE 0.8 08/29/2012 0955   CREATININE 0.8 11/17/2011 0847   CREATININE 0.75 09/19/2011 1027   CALCIUM 9.3 08/29/2012 0955   CALCIUM 9.0 11/17/2011 0847   CALCIUM 8.9 09/19/2011 1027   PROT 7.3 08/29/2012 0955   PROT 7.3 11/17/2011 0847   PROT 6.3 09/19/2011 1027   ALBUMIN 3.6 08/29/2012 0955   ALBUMIN 3.9 09/19/2011 1027   AST 15 08/29/2012 0955   AST 23 11/17/2011 0847   AST 12 09/19/2011 1027   ALT 10 08/29/2012 0955   ALT <8 09/19/2011 1027   ALKPHOS 64 08/29/2012 0955   ALKPHOS 60 11/17/2011 0847   ALKPHOS 65 09/19/2011 1027   BILITOT 0.52 08/29/2012 0955   BILITOT 0.60 11/17/2011 0847   BILITOT 0.3 09/19/2011 1027   GFRNONAA >60 06/01/2008 0426   GFRAA  Value: >60        The eGFR has been calculated using the MDRD equation. This calculation has not been validated in all clinical 06/01/2008 0426   Impression: Stage I A. (T1 A. N0 M0) bronchitic carcinoma the right lung, favor non-small cell carcinoma, second  lung primary. We discussed obtaining a confirmatory biopsy, but she refuses. She does not want to accept the risk for a possible pneumothorax/need for chest tube. Based on her PET scan and CT appearance this almost certainly represents bronchogenic, non-small cell carcinoma. Her small cell lung cancer remains in remission. We discussed surgery versus SBRT. In with her previous history of radiation therapy, she is an excellent candidate for SBRT. We should be able to do this in 3 fractions. I discussed the potential acute and late toxicities of radiation therapy and she would like to proceed as soon as possible so she can visit family out of  state later this month. Consent is signed today.  Plan: As discussed above.  30 minutes was spent face-to-face the patient, primarily counseling the patient and coordinating her care.

## 2012-09-26 NOTE — Progress Notes (Addendum)
CT simulation/treatment planning note: The patient was taken to the CT simulator. She was placed supine on the CT simulation table. She was placed in a custom body fix immobilization device. Abdominal compression was applied. She was then scanned in 4 D. There was very little movement of the primary tumor within the right upper lobe. The primary tumor was contoured in the MIP image set making sure that the ITV encompass the primary tumor and all phases of the breathing cycle. The ITV was an expanded uniformly 0.8 cm to create PTV 54. This received 5400 cGy in 3 sessions. She is now ready for 3-D conformal planning with VMAT. Please see separate note for motion management.

## 2012-09-26 NOTE — Addendum Note (Signed)
Encounter addended by: Eduardo Osier, RN on: 09/26/2012 11:49 AM<BR>     Documentation filed: Demographics Visit

## 2012-09-26 NOTE — Progress Notes (Signed)
  Radiation Oncology         [phone] ________________________________  Name: Janice Daniels MRN: [id number]  Date: 09/26/2012  DOB: 15-Mar-1946  RESPIRATORY MOTION MANAGEMENT SIMULATION  NARRATIVE:  In order to account for effect of respiratory motion on target structures and other organs in the planning and delivery of radiotherapy, this patient underwent respiratory motion management simulation.  To accomplish this, when the patient was brought to the CT simulation planning suite, 4D respiratoy motion management CT images were obtained.  The CT images were loaded into the planning software.  Then, using a variety of tools including Cine, MIP, and standard views, the target volume and planning target volumes (PTV) were delineated.  Avoidance structures were contoured.  Treatment planning then occurred.  Dose volume histograms were generated and reviewed for each of the requested structure.  The resulting plan was carefully reviewed and approved today.

## 2012-09-30 ENCOUNTER — Encounter: Payer: Self-pay | Admitting: Radiation Oncology

## 2012-09-30 NOTE — Progress Notes (Signed)
3-D SBRT simulation/treatment planning note: Ms. Governor Rooks completed her 3-D planning for her right lung SBRT. Dose fine histograms were obtained for the target structures (ITV) and also normal structures including the spinal cord, and lung. Her previous radiation therapy to her chest/lung was also fused with her current 3-D planned. This constitutes a special treatment procedure because of overlap with previous radiation therapy volumes. She was setup to 3 modulated arcs with 3 complex treatment devices. I prescribing 5400 cGy in 3 fractions utilizing 6 MV photons. We met our departmental guidelines. Special physics consult was requested because of her previous radiation therapy.

## 2012-10-02 ENCOUNTER — Ambulatory Visit: Payer: Medicare Other | Admitting: Radiation Oncology

## 2012-10-03 ENCOUNTER — Ambulatory Visit
Admission: RE | Admit: 2012-10-03 | Discharge: 2012-10-03 | Disposition: A | Discharge status: Home / Self Care | Payer: Medicare Other | Source: Ambulatory Visit | Attending: Radiation Oncology | Admitting: Radiation Oncology

## 2012-10-03 NOTE — Progress Notes (Signed)
  Radiation Oncology         [phone] ________________________________  Name: Janice Daniels MRN: [id number]  Date: 10/03/2012  DOB: 1945-10-12  Stereotactic Body Radiotherapy Treatment Procedure Note  NARRATIVE:  Janice Daniels was brought to the stereotactic radiation treatment machine and placed supine on the CT couch. The patient was set up for stereotactic body radiotherapy on the body fix pillow.  3D TREATMENT PLANNING AND DOSIMETRY:  The patient's radiation plan was reviewed and approved prior to starting treatment.  It showed 3-dimensional radiation distributions overlaid onto the planning CT.  The [facility] for the target structures as well as the organs at risk were reviewed. The documentation of this is filed in the radiation oncology EMR.  SIMULATION VERIFICATION:  The patient underwent CT imaging on the treatment unit.  These were carefully aligned to document that the ablative radiation dose would cover the target volume and maximally spare the nearby organs at risk according to the planned distribution.  SPECIAL TREATMENT PROCEDURE: Janice Daniels received high dose ablative stereotactic body radiotherapy to the planned target volume without unforeseen complications. Treatment was delivered to the right lung primary uneventfully. She received her first fraction of 1800 cGy today she will dose today this 1800 cGy of a prescribed 5400 cGy in 3 sessions. The high doses associated with stereotactic body radiotherapy and the significant potential risks require careful treatment set up and patient monitoring constituting a special treatment procedure   STEREOTACTIC TREATMENT MANAGEMENT:  Following delivery, the patient was evaluated clinically. The patient tolerated treatment without significant acute effects, and was discharged to home in stable condition.    PLAN: Continue treatment as planned.  ------------------------------------------------        Maryln Gottron, MD

## 2012-10-04 ENCOUNTER — Ambulatory Visit: Payer: Medicare Other | Admitting: Radiation Oncology

## 2012-10-07 ENCOUNTER — Ambulatory Visit
Admission: RE | Admit: 2012-10-07 | Discharge: 2012-10-07 | Disposition: A | Discharge status: Home / Self Care | Payer: Medicare Other | Source: Ambulatory Visit | Attending: Radiation Oncology | Admitting: Radiation Oncology

## 2012-10-07 NOTE — Progress Notes (Signed)
Weekly Management Note:  Site: Right lung apex Current Dose:  3600  cGy Projected Dose: 5040  cGy  Narrative: The patient is seen today for routine under treatment assessment. CBCT/MVCT images/port films were reviewed. The chart was reviewed.   No complaints today. Treatment setup is excellent.  Physical Examination:  Filed Vitals:   10/07/12 1200  BP: 124/67  Pulse: 108  Temp: 97.7 F (36.5 C)  .  Weight: 121 lb 1.6 oz (54.931 kg). No change.  Impression: Tolerating radiation therapy well. She finishes her radiation therapy this Wednesday.  Plan: Continue radiation therapy as planned. She'll finish this Wednesday and then return for a one-month followup appointment.

## 2012-10-07 NOTE — Progress Notes (Signed)
  Radiation Oncology         [phone] ________________________________  Name: LAPORCHE MARTELLE MRN: [id number]  Date: 10/07/2012  DOB: [birth year]  Stereotactic Body Radiotherapy Treatment Procedure Note  NARRATIVE:  Adlai Nieblas Stiggers was brought to the stereotactic radiation treatment machine and placed supine on the CT couch. The patient was set up for stereotactic body radiotherapy on the body fix pillow.  3D TREATMENT PLANNING AND DOSIMETRY:  The patient's radiation plan was reviewed and approved prior to starting treatment.  It showed 3-dimensional radiation distributions overlaid onto the planning CT.  The [facility] for the target structures as well as the organs at risk were reviewed. The documentation of this is filed in the radiation oncology EMR.  SIMULATION VERIFICATION:  The patient underwent CT imaging on the treatment unit.  These were carefully aligned to document that the ablative radiation dose would cover the right lung apex target volume and maximally spare the nearby organs at risk according to the planned distribution.  SPECIAL TREATMENT PROCEDURE: Alejah Aristizabal Arizmendi received high dose ablative stereotactic body radiotherapy to the planned target volume without unforeseen complications. Treatment was delivered uneventfully. She received an additional 1800 cGy for a cumulative dose of 3600 cGy and 2 sessions. The high doses associated with stereotactic body radiotherapy and the significant potential risks require careful treatment set up and patient monitoring constituting a special treatment procedure   STEREOTACTIC TREATMENT MANAGEMENT:  Following delivery, the patient was evaluated clinically. The patient tolerated treatment without significant acute effects, and was discharged to home in stable condition.    PLAN: Continue treatment as planned.  ------------------------------------------------        Maryln Gottron, MD

## 2012-10-07 NOTE — Progress Notes (Signed)
Janice Daniels here for weekly put visit.  She has had 2/3 treatments to her right lung.  She denies pain, fatigue and shortness of breath.  She does not think she needs any radiaplex gel since she is having 3 treatments.

## 2012-10-08 ENCOUNTER — Ambulatory Visit: Payer: Medicare Other | Admitting: Radiation Oncology

## 2012-10-08 ENCOUNTER — Encounter: Payer: Self-pay | Admitting: Pulmonary Disease

## 2012-10-08 ENCOUNTER — Ambulatory Visit (INDEPENDENT_AMBULATORY_CARE_PROVIDER_SITE_OTHER): Payer: Medicare Other | Admitting: Pulmonary Disease

## 2012-10-08 VITALS — BP 116/70 | HR 112 | Temp 98.0°F | Ht 64.0 in | Wt 122.2 lb

## 2012-10-08 DIAGNOSIS — J4489 Other specified chronic obstructive pulmonary disease: Secondary | ICD-10-CM

## 2012-10-08 DIAGNOSIS — J449 Chronic obstructive pulmonary disease, unspecified: Secondary | ICD-10-CM

## 2012-10-08 NOTE — Progress Notes (Signed)
  Subjective:    Patient ID: Janice Daniels, female    DOB: May 02, 1946, 67 y.o.   MRN: [id number]  HPI The patient comes in today for followup of her known COPD.  She also has a history of lung cancer, and has been recently diagnosed with a recurrence, for which she is receiving radiation.  She has been staying compliant on her maintenance bronchodilator regimen, and feels that she is doing very well.  She has not had a recent chest cold or acute exacerbation.  She denies any significant chest congestion or purulence at this time.   Review of Systems  Constitutional: Negative for fever and unexpected weight change.  HENT: Positive for congestion, rhinorrhea, sneezing and postnasal drip. Negative for ear pain, nosebleeds, sore throat, trouble swallowing, dental problem and sinus pressure.   Eyes: Negative for redness and itching.  Respiratory: Positive for cough and wheezing. Negative for chest tightness and shortness of breath.   Cardiovascular: Negative for palpitations and leg swelling.  Gastrointestinal: Negative for nausea and vomiting.  Genitourinary: Negative for dysuria.  Musculoskeletal: Negative for joint swelling.  Skin: Negative for rash.  Neurological: Positive for headaches.  Hematological: Does not bruise/bleed easily.  Psychiatric/Behavioral: Negative for dysphoric mood. The patient is not nervous/anxious.        Objective:   Physical Exam Thin female in nad Nose without purulence or discharge noted. Neck without LN or TMG Chest with mild decrease in bs, no wheezing. Cor with rrr LE without edema, no cyanosis Alert and oriented, moves all 4.        Assessment & Plan:

## 2012-10-08 NOTE — Patient Instructions (Addendum)
Stay on advair and spiriva Stay as active as possible followup with me in 6mos, but call if you are having breathing issues.

## 2012-10-08 NOTE — Assessment & Plan Note (Signed)
The patient appears to be stable from a pulmonary standpoint on her current bronchodilator regimen.  She has not had a recent acute exacerbation, and is currently receiving radiation for a recurrence of her lung cancer.  Despite this, she is staying very active, and feels that her breathing is stable.  I have asked her to stay on her current medications, and to followup with me in 6 months.

## 2012-10-09 ENCOUNTER — Ambulatory Visit
Admission: RE | Admit: 2012-10-09 | Discharge: 2012-10-09 | Disposition: A | Discharge status: Home / Self Care | Payer: Medicare Other | Source: Ambulatory Visit | Attending: Radiation Oncology | Admitting: Radiation Oncology

## 2012-10-09 ENCOUNTER — Encounter: Payer: Self-pay | Admitting: Radiation Oncology

## 2012-10-09 ENCOUNTER — Ambulatory Visit: Payer: Medicare Other | Admitting: Radiation Oncology

## 2012-10-09 DIAGNOSIS — C3410 Malignant neoplasm of upper lobe, unspecified bronchus or lung: Secondary | ICD-10-CM | POA: Insufficient documentation

## 2012-10-09 NOTE — Progress Notes (Signed)
[facility] Radiation Oncology End of Treatment Note  Name:Janice Daniels  Date: 10/09/2012 HYQ:[id number] DOB:03/01/1946   Status:outpatient    CC: Garlan Fillers, MD  Dr. Si Gaul  REFERRING PHYSICIAN:    Dr. 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 Merlos tolerated her treatment beautifully with no toxicity whatsoever during her course of treatment.                          PLAN: Routine followup in one month. Patient instructed to call if questions or worsening complaints in interim.

## 2012-10-09 NOTE — Progress Notes (Signed)
  Radiation Oncology         [phone] ________________________________  Name: Janice Daniels MRN: [id number]  Date: 10/09/2012  DOB: 1946-02-18  Stereotactic Body Radiotherapy Treatment Procedure Note  NARRATIVE:  Janice Daniels was brought to the stereotactic radiation treatment machine and placed supine on the CT couch. The patient was set up for stereotactic body radiotherapy on the body fix pillow.  3D TREATMENT PLANNING AND DOSIMETRY:  The patient's radiation plan was reviewed and approved prior to starting treatment.  It showed 3-dimensional radiation distributions overlaid onto the planning CT.  The [facility] for the target structures as well as the organs at risk were reviewed. The documentation of this is filed in the radiation oncology EMR.  SIMULATION VERIFICATION:  The patient underwent CT imaging on the treatment unit.  These were carefully aligned to document that the ablative radiation dose would cover the right upper lobe target volume and maximally spare the nearby organs at risk according to the planned distribution.  SPECIAL TREATMENT PROCEDURE: Janice Daniels received high dose ablative stereotactic body radiotherapy to the planned target volume without unforeseen complications. Treatment was delivered uneventfully. She received an additional 1800 cGy for a cumulative dose of 5400 cGy in 3 fractions. The high doses associated with stereotactic body radiotherapy and the significant potential risks require careful treatment set up and patient monitoring constituting a special treatment procedure   STEREOTACTIC TREATMENT MANAGEMENT:  Following delivery, the patient was evaluated clinically. The patient tolerated treatment without significant acute effects, and was discharged to home in stable condition.    PLAN: Radiation therapy completed, followup visit in one month.  ------------------------------------------------        Maryln Gottron, MD

## 2012-10-09 NOTE — Progress Notes (Signed)
Chart note: The patient also had a custom Acuform neck immobilization device constructed at the time of her initial CT simulation on 09/26/2012. Because of previous radiation therapy to the chest I requested physics consultation and because of her previous chest irradiation this represented a special treatment procedure due to the enhanced planning necessary with calculation of dose volume histograms for the low and high dose lung volumes.

## 2012-10-10 ENCOUNTER — Ambulatory Visit: Payer: Medicare Other | Admitting: Radiation Oncology

## 2012-11-13 ENCOUNTER — Encounter: Payer: Self-pay | Admitting: Radiation Oncology

## 2012-11-13 ENCOUNTER — Ambulatory Visit
Admission: RE | Admit: 2012-11-13 | Discharge: 2012-11-13 | Disposition: A | Discharge status: Home / Self Care | Payer: Medicare Other | Source: Ambulatory Visit | Attending: Radiation Oncology | Admitting: Radiation Oncology

## 2012-11-13 HISTORY — DX: Personal history of irradiation: Z92.3

## 2012-11-13 NOTE — Progress Notes (Addendum)
Pt denies pain, fatigue, loss of appetite. She has occasional prod cough w/clear sputum, SOB w/exertion, weakness. Pt states she feels she has "allergies now w/sinus drainage in her throat". She denies sore throat. Pt denies smoking.

## 2012-11-13 NOTE — Progress Notes (Signed)
CC: Dr. Si Gaul  Followup note:  The patient returns today approximately 5 weeks following completion of SBRT the management of her stage T1a  non-small cell carcinoma the right lung (no biopsy confirmation). She should do well but has had some exacerbation of her cough which she attributes to seasonal allergies. Dr. Arbutus Ped for scheduled for a CT scan of the chest on June 24 with a followup visit on June 26. She denies dyspnea.  Physical examination: She looks well. Filed Vitals:   11/13/12 1047  BP: 132/81  Pulse: 96  Temp: 97.9 F (36.6 C)  Resp: 20   Nodes: There is no palpable cervical or supraclavicular lymphadenopathy. Chest: Breath sounds distant, otherwise clear.  Abdomen data: Lab Results  Component Value Date   WBC 4.7 08/29/2012   HGB 13.2 08/29/2012   HCT 38.7 08/29/2012   MCV 98.1 08/29/2012   PLT 230 08/29/2012   Impression stage IA (T1a N0 M0) non-small cell carcinoma of the right lung (no biopsy confirmation). She is currently asymptomatic except for seasonal allergies.  Plan: CT scan as ordered by Dr. Arbutus Ped on June 24. I will see the patient back for a followup visit in 2 months.

## 2012-12-17 ENCOUNTER — Ambulatory Visit (HOSPITAL_COMMUNITY)
Admission: RE | Admit: 2012-12-17 | Discharge: 2012-12-17 | Disposition: A | Discharge status: Home / Self Care | Payer: Medicare Other | Source: Ambulatory Visit | Attending: Internal Medicine | Admitting: Internal Medicine

## 2012-12-17 ENCOUNTER — Other Ambulatory Visit (HOSPITAL_BASED_OUTPATIENT_CLINIC_OR_DEPARTMENT_OTHER): Payer: Medicare Other | Admitting: Lab

## 2012-12-17 ENCOUNTER — Other Ambulatory Visit: Payer: Medicare Other | Admitting: Lab

## 2012-12-17 DIAGNOSIS — Z9221 Personal history of antineoplastic chemotherapy: Secondary | ICD-10-CM | POA: Insufficient documentation

## 2012-12-17 DIAGNOSIS — Z923 Personal history of irradiation: Secondary | ICD-10-CM | POA: Insufficient documentation

## 2012-12-17 DIAGNOSIS — J438 Other emphysema: Secondary | ICD-10-CM | POA: Insufficient documentation

## 2012-12-17 DIAGNOSIS — C3490 Malignant neoplasm of unspecified part of unspecified bronchus or lung: Secondary | ICD-10-CM | POA: Insufficient documentation

## 2012-12-17 DIAGNOSIS — C3410 Malignant neoplasm of upper lobe, unspecified bronchus or lung: Secondary | ICD-10-CM

## 2012-12-17 DIAGNOSIS — J479 Bronchiectasis, uncomplicated: Secondary | ICD-10-CM | POA: Insufficient documentation

## 2012-12-17 LAB — COMPREHENSIVE METABOLIC PANEL (CC13)
ALT: 7 U/L (ref 0–55)
Albumin: 3.7 g/dL (ref 3.5–5.0)
CO2: 27 mEq/L (ref 22–29)
Chloride: 105 mEq/L (ref 98–107)
Potassium: 4.6 mEq/L (ref 3.5–5.1)
Sodium: 140 mEq/L (ref 136–145)
Total Bilirubin: 0.37 mg/dL (ref 0.20–1.20)
Total Protein: 7.2 g/dL (ref 6.4–8.3)

## 2012-12-17 LAB — CBC WITH DIFFERENTIAL/PLATELET
BASO%: 0.4 % (ref 0.0–2.0)
LYMPH%: 12.1 % — ABNORMAL LOW (ref 14.0–49.7)
MCHC: 34.8 g/dL (ref 31.5–36.0)
MONO#: 0.7 10*3/uL (ref 0.1–0.9)
NEUT#: 4.1 10*3/uL (ref 1.5–6.5)
RBC: 4.14 10*6/uL (ref 3.70–5.45)
RDW: 12.9 % (ref 11.2–14.5)
WBC: 5.6 10*3/uL (ref 3.9–10.3)
lymph#: 0.7 10*3/uL — ABNORMAL LOW (ref 0.9–3.3)

## 2012-12-17 MED ORDER — IOHEXOL 300 MG/ML  SOLN
80.0000 mL | Freq: Once | INTRAMUSCULAR | Status: AC | PRN
  Administered 2012-12-17: 80 mL via INTRAVENOUS

## 2012-12-19 ENCOUNTER — Encounter: Payer: Self-pay | Admitting: Internal Medicine

## 2012-12-19 ENCOUNTER — Other Ambulatory Visit: Payer: Medicare Other | Admitting: Lab

## 2012-12-19 ENCOUNTER — Ambulatory Visit (HOSPITAL_BASED_OUTPATIENT_CLINIC_OR_DEPARTMENT_OTHER): Payer: Medicare Other | Admitting: Internal Medicine

## 2012-12-19 ENCOUNTER — Telehealth: Payer: Self-pay | Admitting: Internal Medicine

## 2012-12-19 VITALS — BP 137/68 | HR 112 | Temp 98.0°F | Resp 18 | Ht 64.0 in | Wt 119.1 lb

## 2012-12-19 DIAGNOSIS — C3491 Malignant neoplasm of unspecified part of right bronchus or lung: Secondary | ICD-10-CM

## 2012-12-19 DIAGNOSIS — C3490 Malignant neoplasm of unspecified part of unspecified bronchus or lung: Secondary | ICD-10-CM

## 2012-12-19 NOTE — Telephone Encounter (Signed)
Gave pt appt for lab and MD on September 2014 °

## 2012-12-19 NOTE — Progress Notes (Signed)
[facility] [phone]   [phone]  OFFICE PROGRESS NOTE  Garlan Fillers, MD [address] [facility], [place]. [place] [address]  DIAGNOSIS: Limited stage small cell lung cancer diagnosed in October of 2012 .   PRIOR THERAPY:  1) Status post palliative radiotherapy to the right hilum under the care Dr. Dayton Scrape.  2) Systemic chemotherapy with carboplatin for AUC of 5 on day 1 and etoposide 120 mg/M2 on days 1,2 and 3 with Neulasta support on day 4, status post 6 cycles.  3) Prophylactic cranial irradiation under the care of Dr. Dayton Scrape.  4) Curative stereotactic radiotherapy to right lung nodule suspicious for a stage IA non-small cell lung cancer under the care of Dr. Dayton Scrape completed on 10/09/2012.  CURRENT THERAPY: Observation.  INTERVAL HISTORY: Janice Daniels 67 y.o. female returns to the clinic today for followup visit. The patient is feeling fine today with no specific complaints. She tolerated her curative radiotherapy fairly well with no significant adverse effects. She denied having any significant chest pain, shortness of breath, cough or hemoptysis. She denied having any weight loss or night sweats. The patient has no nausea or vomiting. She had repeat CT scan of the chest performed recently and she is here for evaluation and discussion of her scan results.  MEDICAL HISTORY: Past Medical History  Diagnosis Date  . Hypertension   . Hypothyroid   . GERD (gastroesophageal reflux disease)   . COPD (chronic obstructive pulmonary disease)   . Low back pain   . Lung cancer 05/09/2011    RUL  . Depression   . History of radiation therapy 06/05/11 to 07/18/11    lung  . History of radiation therapy 09/13/11 to 09/26/11    brain  . Shingles   . Lung cancer, upper lobe 09/02/2012    rul  . History of chemotherapy     carboplatin and etoposide  . Hx of radiation therapy 10/03/12 - 10/09/12    RUL lung     ALLERGIES:  is allergic to codeine.  MEDICATIONS:  Current Outpatient Prescriptions  Medication Sig Dispense Refill  . acetaminophen (TYLENOL) 500 MG tablet Take 500 mg by mouth every 6 (six) hours as needed.        Marland Kitchen ADVAIR DISKUS 250-50 MCG/DOSE AEPB Inhale 1 puff into the lungs as needed.      . Cholecalciferol (VITAMIN D3 PO) Take 1 tablet by mouth every 30 (thirty) days.      . citalopram (CELEXA) 10 MG tablet Take 10 mg by mouth daily.        Marland Kitchen docusate sodium (COLACE) 100 MG capsule Take 100 mg by mouth 2 (two) times daily as needed.       . gabapentin (NEURONTIN) 600 MG tablet Take 600 mg by mouth daily. Per [facility] at Dr. Silvano Rusk office.      Marland Kitchen levothyroxine (SYNTHROID, LEVOTHROID) 112 MCG tablet Take 112 mcg by mouth daily.        Marland Kitchen loratadine (CLARITIN) 10 MG tablet Take 10 mg by mouth daily. prn       . montelukast (SINGULAIR) 10 MG tablet Take 10 mg by mouth daily.      . Multiple Vitamin (MULTIVITAMIN) tablet Take 1 tablet by mouth daily.        . prochlorperazine (COMPAZINE) 10 MG tablet Take 10 mg by mouth every 6 (six) hours as needed.        Marland Kitchen albuterol (PROVENTIL) (2.5 MG/3ML)  0.083% nebulizer solution Take 2.5 mg by nebulization every 4 (four) hours as needed.        Marland Kitchen albuterol (VENTOLIN HFA) 108 (90 BASE) MCG/ACT inhaler Inhale 2 puffs into the lungs every 6 (six) hours as needed.        . tiotropium (SPIRIVA) 18 MCG inhalation capsule Place 18 mcg into inhaler and inhale daily as needed.       No current facility-administered medications for this visit.    SURGICAL HISTORY:  Past Surgical History  Procedure Laterality Date  . Tubal ligation  1984  . Total abdominal hysterectomy  1986  . Tonsillectomy    . Endobronchial biopsy Right 05/03/2011    REVIEW OF SYSTEMS:  A comprehensive review of systems was negative.   PHYSICAL EXAMINATION: General appearance: alert, cooperative and no distress Head: Normocephalic, without obvious abnormality,  atraumatic Neck: no adenopathy Lymph nodes: Cervical, supraclavicular, and axillary nodes normal. Resp: clear to auscultation bilaterally Cardio: regular rate and rhythm, S1, S2 normal, no murmur, click, rub or gallop GI: soft, non-tender; bowel sounds normal; no masses,  no organomegaly Extremities: extremities normal, atraumatic, no cyanosis or edema  ECOG PERFORMANCE STATUS: 1 - Symptomatic but completely ambulatory  Blood pressure 137/68, pulse 112, temperature 98 F (36.7 C), temperature source Oral, resp. rate 18, height 5\' 4"  (1.626 m), weight 119 lb 1.6 oz (54.023 kg).  LABORATORY DATA: Lab Results  Component Value Date   WBC 5.6 12/17/2012   HGB 13.7 12/17/2012   HCT 39.4 12/17/2012   MCV 95.1 12/17/2012   PLT 224 12/17/2012      Chemistry      Component Value Date/Time   NA 140 12/17/2012 1006   NA 143 11/17/2011 0847   NA 139 09/19/2011 1027   K 4.6 12/17/2012 1006   K 4.6 11/17/2011 0847   K 4.2 09/19/2011 1027   CL 105 12/17/2012 1006   CL 101 11/17/2011 0847   CL 105 09/19/2011 1027   CO2 27 12/17/2012 1006   CO2 30 11/17/2011 0847   CO2 28 09/19/2011 1027   BUN 9.0 12/17/2012 1006   BUN 13 11/17/2011 0847   BUN 15 09/19/2011 1027   CREATININE 0.8 12/17/2012 1006   CREATININE 0.8 11/17/2011 0847   CREATININE 0.75 09/19/2011 1027      Component Value Date/Time   CALCIUM 9.2 12/17/2012 1006   CALCIUM 9.0 11/17/2011 0847   CALCIUM 8.9 09/19/2011 1027   ALKPHOS 62 12/17/2012 1006   ALKPHOS 60 11/17/2011 0847   ALKPHOS 65 09/19/2011 1027   AST 12 12/17/2012 1006   AST 23 11/17/2011 0847   AST 12 09/19/2011 1027   ALT 7 12/17/2012 1006   ALT <8 09/19/2011 1027   BILITOT 0.37 12/17/2012 1006   BILITOT 0.60 11/17/2011 0847   BILITOT 0.3 09/19/2011 1027       RADIOGRAPHIC STUDIES: Ct Chest W Contrast  12/17/2012   *RADIOLOGY REPORT*  Clinical Data: Lung cancer diagnosed 09/12.  Chemotherapy and radiation therapy complete.  No current complaints.  CT CHEST WITH CONTRAST  Technique:   Multidetector CT imaging of the chest was performed following the standard protocol during bolus administration of intravenous contrast.  Contrast: 80mL OMNIPAQUE IOHEXOL 300 MG/ML  SOLN  Comparison: PET 09/06/2012.  Most recent chest CT 08/30/1946.  Findings: Lungs/pleura: Probable secretions within the trachea. Mild centrilobular emphysema.  Decreased size of the previous described right upper lobe/apical lung nodule.  8 x 6 mm on image 13/series 5 versus 11 x  8 mm at the same level on the prior exam.  Similar appearance of radiation induced interstitial thickening and mild bronchiectasis involving the central right upper and right middle lobes.  Minimal extension into the superior segment right lower lobe.  No new or enlarging pulmonary lesions.  Minimal right-sided pleural fluid which is not significantly changed.  Heart/Mediastinum: Normal heart size, without pericardial effusion. No central pulmonary embolism, on this non-dedicated study.  Stable small middle mediastinal nodes, without adenopathy.  Right hilar tissue is similarly upper normal in size, including on image 24/series 2.  Not hypermetabolic at interval PET  Upper abdomen: No significant findings.  Normal adrenal glands.  Bones/Musculoskeletal:  No acute osseous abnormality.  IMPRESSION:  1.  Decrease size of the right apical pulmonary nodule. 2.  Similar configuration of radiation changes throughout the medial right lung. 3.  Upper normal right hilar nodal tissue, similar to on the prior and not significantly hypermetabolic at interval PET. Recommend attention on follow-up. 4.  Similar trace right-sided pleural fluid.   Original Report Authenticated By: Jeronimo Greaves, M.D.    ASSESSMENT AND PLAN: This is a very pleasant 67 years old white female with history of limited stage small cell lung cancer as well as questionable stage IA non-small cell lung cancer of the right lung recently status post curative stereotactic radiotherapy. The patient is  doing fine today with no specific complaints. Her CT scan showed no evidence for disease recurrence or progression. I discussed the scan results with the patient. I recommended for her to continue on observation with repeat CT scan of the chest in 3 months. She was advised to call immediately if she has any concerning symptoms in the interval.  All questions were answered. The patient knows to call the clinic with any problems, questions or concerns. We can certainly see the patient much sooner if necessary.

## 2012-12-20 NOTE — Patient Instructions (Signed)
No evidence for disease progression on his recent scan. Followup visit in 3 months with repeat CT scan of the chest.

## 2013-01-21 ENCOUNTER — Encounter: Payer: Self-pay | Admitting: Radiation Oncology

## 2013-01-21 ENCOUNTER — Ambulatory Visit
Admission: RE | Admit: 2013-01-21 | Discharge: 2013-01-21 | Disposition: A | Discharge status: Home / Self Care | Payer: Medicare Other | Source: Ambulatory Visit | Attending: Radiation Oncology | Admitting: Radiation Oncology

## 2013-01-21 NOTE — Progress Notes (Signed)
Followup note:  Diagnosis: Clinical stage I A. (T1a N0 M0) non-small cell carcinoma the right lung (no biopsy confirmation)  Janice Daniels returns today approximately 3-1/2 months following SBRT in the management of her suspected non-small cell carcinoma of the right lung. She is without complaints today. She states that her breathing is "good". She is off cigarettes. She was recently seen by Dr. Arbutus Ped on June 27 with a followup CT scan on June 24 showing decrease in size of the right apical pulmonary nodule along with upper normal right hilar and nodal tissue essentially unchanged compared to her previous interval PET scan.  Physical examination: Alert and oriented. She is in no acute respiratory distress. Filed Vitals:   01/21/13 1028  BP: 138/73  Pulse: 92  Temp: 97.4 F (36.3 C)  Resp: 18   Head and neck examination: Grossly unremarkable. Nodes: Without palpable cervical or supraclavicular lymphadenopathy. Chest: Lungs clear although breath sounds are distant. Heart: Regular rate and rhythm. Extremities: Without edema.  Laboratory data: Lab Results  Component Value Date   WBC 5.6 12/17/2012   HGB 13.7 12/17/2012   HCT 39.4 12/17/2012   MCV 95.1 12/17/2012   PLT 224 12/17/2012   Impression: Satisfactory progress. Favorable response, radiographically, thus far.  Plan: Followup through Dr. Arbutus Ped he please see her for a followup visit in 2 more months. I've not scheduled her for a followup visit provided that she maintain her followup with Dr. Arbutus Ped.

## 2013-01-21 NOTE — Progress Notes (Signed)
Patient reports a cough that is mostly dry but, only on occasion productive with clear sputum. Reports mild shortness of breath with exertion. Reports frequent frontal and temporal headaches. Reports last week she felt under the weather. Going on to explain that she had nausea, vomiting, sore throat and lack of energy last week. Reports fatigue continues. Denies dizziness, night sweats or unintentional weight loss. Reports intermittent middle back pain at old shingles site and upper back pain for which she takes tylenol.

## 2013-03-17 ENCOUNTER — Telehealth: Payer: Self-pay | Admitting: Internal Medicine

## 2013-03-17 NOTE — Telephone Encounter (Signed)
, °

## 2013-03-19 ENCOUNTER — Other Ambulatory Visit: Payer: Medicare Other | Admitting: Lab

## 2013-03-19 ENCOUNTER — Ambulatory Visit (HOSPITAL_COMMUNITY)
Admission: RE | Admit: 2013-03-19 | Discharge: 2013-03-19 | Disposition: A | Discharge status: Home / Self Care | Payer: Medicare Other | Source: Ambulatory Visit | Attending: Internal Medicine | Admitting: Internal Medicine

## 2013-03-19 ENCOUNTER — Other Ambulatory Visit: Payer: Self-pay | Admitting: Internal Medicine

## 2013-03-19 DIAGNOSIS — C3491 Malignant neoplasm of unspecified part of right bronchus or lung: Secondary | ICD-10-CM

## 2013-03-19 DIAGNOSIS — C3490 Malignant neoplasm of unspecified part of unspecified bronchus or lung: Secondary | ICD-10-CM | POA: Insufficient documentation

## 2013-03-19 DIAGNOSIS — J438 Other emphysema: Secondary | ICD-10-CM | POA: Insufficient documentation

## 2013-03-19 DIAGNOSIS — J479 Bronchiectasis, uncomplicated: Secondary | ICD-10-CM | POA: Insufficient documentation

## 2013-03-19 DIAGNOSIS — Z923 Personal history of irradiation: Secondary | ICD-10-CM | POA: Insufficient documentation

## 2013-03-19 DIAGNOSIS — I2510 Atherosclerotic heart disease of native coronary artery without angina pectoris: Secondary | ICD-10-CM | POA: Insufficient documentation

## 2013-03-19 DIAGNOSIS — R911 Solitary pulmonary nodule: Secondary | ICD-10-CM | POA: Insufficient documentation

## 2013-03-19 DIAGNOSIS — Z9221 Personal history of antineoplastic chemotherapy: Secondary | ICD-10-CM | POA: Insufficient documentation

## 2013-03-19 LAB — COMPREHENSIVE METABOLIC PANEL (CC13)
ALT: 7 U/L (ref 0–55)
Alkaline Phosphatase: 73 U/L (ref 40–150)
Sodium: 143 mEq/L (ref 136–145)
Total Bilirubin: 0.66 mg/dL (ref 0.20–1.20)
Total Protein: 7.4 g/dL (ref 6.4–8.3)

## 2013-03-19 LAB — CBC WITH DIFFERENTIAL/PLATELET
BASO%: 0.9 % (ref 0.0–2.0)
LYMPH%: 14.4 % (ref 14.0–49.7)
MCHC: 33.3 g/dL (ref 31.5–36.0)
MONO#: 0.8 10*3/uL (ref 0.1–0.9)
MONO%: 14.8 % — ABNORMAL HIGH (ref 0.0–14.0)
Platelets: 379 10*3/uL (ref 145–400)
RBC: 4.36 10*6/uL (ref 3.70–5.45)
WBC: 5.4 10*3/uL (ref 3.9–10.3)

## 2013-03-19 MED ORDER — IOHEXOL 300 MG/ML  SOLN
100.0000 mL | Freq: Once | INTRAMUSCULAR | Status: AC | PRN
  Administered 2013-03-19: 100 mL via INTRAVENOUS

## 2013-03-20 ENCOUNTER — Ambulatory Visit: Payer: Medicare Other | Admitting: Internal Medicine

## 2013-03-26 ENCOUNTER — Ambulatory Visit (HOSPITAL_BASED_OUTPATIENT_CLINIC_OR_DEPARTMENT_OTHER): Payer: Medicare Other | Admitting: Internal Medicine

## 2013-03-26 ENCOUNTER — Encounter: Payer: Self-pay | Admitting: Internal Medicine

## 2013-03-26 ENCOUNTER — Telehealth: Payer: Self-pay | Admitting: Internal Medicine

## 2013-03-26 ENCOUNTER — Ambulatory Visit (HOSPITAL_BASED_OUTPATIENT_CLINIC_OR_DEPARTMENT_OTHER): Payer: Medicare Other

## 2013-03-26 DIAGNOSIS — Z23 Encounter for immunization: Secondary | ICD-10-CM

## 2013-03-26 DIAGNOSIS — C3410 Malignant neoplasm of upper lobe, unspecified bronchus or lung: Secondary | ICD-10-CM

## 2013-03-26 MED ORDER — INFLUENZA VAC SPLIT QUAD 0.5 ML IM SUSP
0.5000 mL | INTRAMUSCULAR | Status: AC
  Administered 2013-03-26: 0.5 mL via INTRAMUSCULAR
  Filled 2013-03-26: qty 0.5

## 2013-03-26 NOTE — Patient Instructions (Signed)
Followup visit in 6 months with repeat CT scan of the chest. 

## 2013-03-26 NOTE — Telephone Encounter (Signed)
gv pt appt schedule for March/April 2015. [facility] will call pt w/appt for ct - pt aware.

## 2013-03-26 NOTE — Progress Notes (Signed)
[facility] Telephone:(336) [phone]   Fax:(336) [phone]  OFFICE PROGRESS NOTE  Garlan Fillers, MD [address] [facility], [place]. 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   PRIOR THERAPY:  1) Status post palliative radiotherapy to the right hilum under the care Dr. Dayton Scrape.  2) Systemic chemotherapy with carboplatin for AUC of 5 on day 1 and etoposide 120 mg/M2 on days 1,2 and 3 with Neulasta support on day 4, status post 6 cycles.  3) Prophylactic cranial irradiation under the care of Dr. Dayton Scrape.  4) Curative stereotactic radiotherapy to right lung nodule suspicious for a stage IA non-small cell lung cancer under the care of Dr. Dayton Scrape completed on 10/09/2012.   CURRENT THERAPY: Observation.  INTERVAL HISTORY: Janice Daniels 67 y.o. female returns to the clinic today for routine three-month followup visit. The patient is feeling fine today with no specific complaints. She denied having any significant weight loss or night sweats. She has no chest pain, shortness breath, cough or hemoptysis. The patient had repeat CT scan of the head and chest performed recently and she is here for evaluation and discussion of her scan results.  MEDICAL HISTORY: Past Medical History  Diagnosis Date  . Hypertension   . Hypothyroid   . GERD (gastroesophageal reflux disease)   . COPD (chronic obstructive pulmonary disease)   . Low back pain   . Lung cancer 05/09/2011    RUL  . Depression   . History of radiation therapy 06/05/11 to 07/18/11    lung  . History of radiation therapy 09/13/11 to 09/26/11    brain  . Shingles   . Lung cancer, upper lobe 09/02/2012    rul  . History of chemotherapy     carboplatin and etoposide  . Hx of radiation therapy 10/03/12 - 10/09/12    RUL lung    ALLERGIES:  is allergic to codeine.  MEDICATIONS:  Current Outpatient Prescriptions  Medication Sig  Dispense Refill  . ADVAIR DISKUS 250-50 MCG/DOSE AEPB Inhale 1 puff into the lungs as needed.      Marland Kitchen albuterol (PROVENTIL) (2.5 MG/3ML) 0.083% nebulizer solution Take 2.5 mg by nebulization every 4 (four) hours as needed.        Marland Kitchen albuterol (VENTOLIN HFA) 108 (90 BASE) MCG/ACT inhaler Inhale 2 puffs into the lungs every 6 (six) hours as needed.        . Cholecalciferol (VITAMIN D3 PO) Take 1 tablet by mouth every 30 (thirty) days.      . citalopram (CELEXA) 10 MG tablet Take 10 mg by mouth daily.        Marland Kitchen gabapentin (NEURONTIN) 600 MG tablet Take 600 mg by mouth daily. Per [facility] at Dr. Silvano Rusk office.      Marland Kitchen levothyroxine (SYNTHROID, LEVOTHROID) 112 MCG tablet Take 112 mcg by mouth daily.        Marland Kitchen loratadine (CLARITIN) 10 MG tablet Take 10 mg by mouth daily. prn       . montelukast (SINGULAIR) 10 MG tablet Take 10 mg by mouth daily.      . Multiple Vitamin (MULTIVITAMIN) tablet Take 1 tablet by mouth daily.        Marland Kitchen tiotropium (SPIRIVA) 18 MCG inhalation capsule Place 18 mcg into inhaler and inhale daily as needed.      Marland Kitchen acetaminophen (TYLENOL) 500 MG tablet Take 500 mg by mouth every 6 (six) hours as  needed.        . docusate sodium (COLACE) 100 MG capsule Take 100 mg by mouth 2 (two) times daily as needed.       . prochlorperazine (COMPAZINE) 10 MG tablet Take 10 mg by mouth every 6 (six) hours as needed.        . ranitidine (ZANTAC) 150 MG capsule 150 mg daily.       No current facility-administered medications for this visit.    SURGICAL HISTORY:  Past Surgical History  Procedure Laterality Date  . Tubal ligation  1984  . Total abdominal hysterectomy  1986  . Tonsillectomy    . Endobronchial biopsy Right 05/03/2011    REVIEW OF SYSTEMS:  A comprehensive review of systems was negative.   PHYSICAL EXAMINATION: General appearance: alert, cooperative and no distress Head: Normocephalic, without obvious abnormality, atraumatic Neck: no adenopathy, no JVD, supple, symmetrical, trachea  midline and thyroid not enlarged, symmetric, no tenderness/mass/nodules Lymph nodes: Cervical, supraclavicular, and axillary nodes normal. Resp: clear to auscultation bilaterally Back: symmetric, no curvature. ROM normal. No CVA tenderness. Cardio: regular rate and rhythm, S1, S2 normal, no murmur, click, rub or gallop GI: soft, non-tender; bowel sounds normal; no masses,  no organomegaly Extremities: extremities normal, atraumatic, no cyanosis or edema  ECOG PERFORMANCE STATUS: 1 - Symptomatic but completely ambulatory  Blood pressure 148/94, pulse 107, temperature 97.4 F (36.3 C), temperature source Oral, resp. rate 18, height 5\' 4"  (1.626 m), weight 115 lb 8 oz (52.39 kg).  LABORATORY DATA: Lab Results  Component Value Date   WBC 5.4 03/19/2013   HGB 14.2 03/19/2013   HCT 42.5 03/19/2013   MCV 97.4 03/19/2013   PLT 379 03/19/2013      Chemistry      Component Value Date/Time   NA 143 03/19/2013 0952   NA 143 11/17/2011 0847   NA 139 09/19/2011 1027   K 4.3 03/19/2013 0952   K 4.6 11/17/2011 0847   K 4.2 09/19/2011 1027   CL 105 12/17/2012 1006   CL 101 11/17/2011 0847   CL 105 09/19/2011 1027   CO2 27 03/19/2013 0952   CO2 30 11/17/2011 0847   CO2 28 09/19/2011 1027   BUN 8.1 03/19/2013 0952   BUN 13 11/17/2011 0847   BUN 15 09/19/2011 1027   CREATININE 0.8 03/19/2013 0952   CREATININE 0.8 11/17/2011 0847   CREATININE 0.75 09/19/2011 1027      Component Value Date/Time   CALCIUM 9.6 03/19/2013 0952   CALCIUM 9.0 11/17/2011 0847   CALCIUM 8.9 09/19/2011 1027   ALKPHOS 73 03/19/2013 0952   ALKPHOS 60 11/17/2011 0847   ALKPHOS 65 09/19/2011 1027   AST 14 03/19/2013 0952   AST 23 11/17/2011 0847   AST 12 09/19/2011 1027   ALT 7 03/19/2013 0952   ALT 17 11/17/2011 0847   ALT <8 09/19/2011 1027   BILITOT 0.66 03/19/2013 0952   BILITOT 0.60 11/17/2011 0847   BILITOT 0.3 09/19/2011 1027       RADIOGRAPHIC STUDIES: Ct Head W Wo Contrast  03/19/2013   *RADIOLOGY REPORT*  Clinical Data: Small  cell lung cancer.  CT HEAD WITHOUT AND WITH CONTRAST  Technique:  Contiguous axial images were obtained from the base of the skull through the vertex without and with intravenous contrast.  Contrast: OMNIPAQUE IOHEXOL 300 MG/ML  SOLN  Comparison:  CT head 08/02/2011.  MR head 05/16/2011.  Findings: There is no evidence for acute infarction, intracranial hemorrhage, mass lesion, hydrocephalus,  or extra-axial fluid.  Mild age related atrophy.  Extensive hypoattenuation of the periventricular and subcortical white matter in a  confluent fashion suggesting post treatment effect or chronic microvascular ischemic change.  This appears to have progressed from 08/02/2011 CT.  This also appears have progressed from prior MR.  MRI is more sensitive in the detection of not only intracranial metastatic disease but post treatment leukoencephalopathy and/or chronic microvascular ischemic change of the white matter.  Post infusion, no abnormal enhancement of the brain or meninges is identified.  Calvarium appears intact.  There is no sinus or mastoid disease.  IMPRESSION: Apparent progression of confluent white matter hypoattenuation without evidence for abnormal intracranial enhancement.  See discussion above.   Original Report Authenticated By: Davonna Belling, M.D.   Ct Chest W Contrast  03/19/2013   *RADIOLOGY REPORT*  Clinical Data: Lung cancer.  Chemotherapy and radiation therapy complete.  CT CHEST WITH CONTRAST  Technique:  Multidetector CT imaging of the chest was performed following the standard protocol during bolus administration of intravenous contrast.  Contrast: OMNIPAQUE IOHEXOL 300 MG/ML  SOLN  Comparison: 12/17/2012 and PET 09/06/2012.  Findings: No pathologically enlarged mediastinal, hilar or axillary lymph nodes.  Atherosclerotic calcification of the arterial vasculature, including coronary arteries.  Heart size normal.  No pericardial effusion.  Pre pericardiac lymph nodes are sub centimeter in  short axis size.  Biapical pleural parenchymal scarring.  Centrilobular and paraseptal emphysema.  Irregular nodule in the apical segment right upper lobe measures 4 x 9 mm, unchanged (image 13).  Post-treatment changes including bronchiectasis, volume loss and architectural distortion are seen in the right upper lobe, extending to the right hilum.  There is some involvement of the superior segment right lower lobe as well.  Findings are unchanged.  Faint 4 mm peripheral left upper lobe nodule, unchanged from 08/29/2012. Trace loculated right pleural fluid, as before.  Airway is otherwise unremarkable.  Incidental imaging of the upper abdomen shows no acute findings. No worrisome lytic or sclerotic lesions.  IMPRESSION:  1.  Post-treatment changes in the right hemithorax, stable. 2.  Irregular apical segment right upper lobe nodule, stable from 12/17/2012 but decreased in size from 08/29/2012. 3.  CT head dictated separately.   Original Report Authenticated By: Leanna Battles, M.D.    ASSESSMENT AND PLAN: This is a very pleasant 67 years old white female with history of limited stage small cell lung cancer diagnosed in October of 2012 status post systemic chemotherapy as well as palliative radiotherapy to the right hilum with prophylactic cranial irradiation and curative radiotherapy to right lung nodule. The patient is doing fine today with no evidence for disease recurrence. I discussed the scan results with the patient and recommended for her to continue on observation with repeat CT scan of the chest in 6 months. The patient will receive flu vaccine today in the clinic. She was advised to call immediately if she has any concerning symptoms in the interval.  The patient voices understanding of current disease status and treatment options and is in agreement with the current care plan.  All questions were answered. The patient knows to call the clinic with any problems, questions or concerns. We can  certainly see the patient much sooner if necessary.

## 2013-04-09 ENCOUNTER — Ambulatory Visit (INDEPENDENT_AMBULATORY_CARE_PROVIDER_SITE_OTHER): Payer: Medicare Other | Admitting: Pulmonary Disease

## 2013-04-09 ENCOUNTER — Encounter: Payer: Self-pay | Admitting: Pulmonary Disease

## 2013-04-09 VITALS — BP 128/70 | HR 103 | Temp 97.9°F | Ht 64.0 in | Wt 118.4 lb

## 2013-04-09 DIAGNOSIS — J449 Chronic obstructive pulmonary disease, unspecified: Secondary | ICD-10-CM

## 2013-04-09 NOTE — Patient Instructions (Signed)
Continue current medications Try to stay as active as possible. followup with me in one year if doing well.

## 2013-04-09 NOTE — Assessment & Plan Note (Signed)
The pt is remaining stable on her current medication regimen.  I have asked her to continue, but to work on a conditioning program.  I would like to see her back in 6mos, but she would rather make it one year.  She will call if having worsening symptoms.

## 2013-04-09 NOTE — Progress Notes (Signed)
  Subjective:    Patient ID: Janice Daniels, female    DOB: 09-25-1945, 67 y.o.   MRN: [id number]  HPI The pt comes in today for f/u of her known copd.  She denies smoking, but reeks of tobacco smoke today.  She feels her breathing is at baseline on her current meds, and denies any worsening cough or congestion.  She has not had an acute exacerbation since the last visit.  She is being followed closely by oncology for her lung cancer.    Review of Systems  Constitutional: Negative for fever and unexpected weight change.  HENT: Negative for congestion, dental problem, ear pain, nosebleeds, postnasal drip, rhinorrhea, sinus pressure, sneezing, sore throat and trouble swallowing.   Eyes: Negative for redness and itching.  Respiratory: Negative for cough, chest tightness, shortness of breath and wheezing.   Cardiovascular: Negative for palpitations and leg swelling.  Gastrointestinal: Negative for nausea and vomiting.  Genitourinary: Negative for dysuria.  Musculoskeletal: Negative for joint swelling.  Skin: Negative for rash.  Neurological: Negative for headaches.  Hematological: Does not bruise/bleed easily.  Psychiatric/Behavioral: Negative for dysphoric mood. The patient is not nervous/anxious.        Objective:   Physical Exam Thin female in nad Nose without purulence or d/c noted. Neck without LN or TMG Chest with decreased bs, no wheezing or rhonchi Cor with rrr LE without edema, no cyanosis Alert and oriented, moves all 4.        Assessment & Plan:

## 2013-08-27 ENCOUNTER — Other Ambulatory Visit (HOSPITAL_COMMUNITY): Payer: Self-pay | Admitting: Internal Medicine

## 2013-08-27 DIAGNOSIS — Z1231 Encounter for screening mammogram for malignant neoplasm of breast: Secondary | ICD-10-CM

## 2013-09-04 ENCOUNTER — Ambulatory Visit (HOSPITAL_COMMUNITY)
Admission: RE | Admit: 2013-09-04 | Discharge: 2013-09-04 | Disposition: A | Discharge status: Home / Self Care | Payer: Medicare HMO | Source: Ambulatory Visit | Attending: Internal Medicine | Admitting: Internal Medicine

## 2013-09-04 DIAGNOSIS — Z1231 Encounter for screening mammogram for malignant neoplasm of breast: Secondary | ICD-10-CM | POA: Insufficient documentation

## 2013-09-23 ENCOUNTER — Other Ambulatory Visit (HOSPITAL_COMMUNITY): Payer: Medicare Other

## 2013-09-23 ENCOUNTER — Other Ambulatory Visit (HOSPITAL_BASED_OUTPATIENT_CLINIC_OR_DEPARTMENT_OTHER): Payer: Commercial Managed Care - HMO

## 2013-09-23 ENCOUNTER — Ambulatory Visit (HOSPITAL_COMMUNITY)
Admission: RE | Admit: 2013-09-23 | Discharge: 2013-09-23 | Disposition: A | Discharge status: Home / Self Care | Payer: Medicare HMO | Source: Ambulatory Visit | Attending: Internal Medicine | Admitting: Internal Medicine

## 2013-09-23 ENCOUNTER — Encounter (HOSPITAL_COMMUNITY): Payer: Self-pay

## 2013-09-23 DIAGNOSIS — Z9221 Personal history of antineoplastic chemotherapy: Secondary | ICD-10-CM | POA: Insufficient documentation

## 2013-09-23 DIAGNOSIS — C3410 Malignant neoplasm of upper lobe, unspecified bronchus or lung: Secondary | ICD-10-CM | POA: Insufficient documentation

## 2013-09-23 DIAGNOSIS — Z923 Personal history of irradiation: Secondary | ICD-10-CM | POA: Insufficient documentation

## 2013-09-23 DIAGNOSIS — J438 Other emphysema: Secondary | ICD-10-CM | POA: Insufficient documentation

## 2013-09-23 LAB — CBC WITH DIFFERENTIAL/PLATELET
BASO%: 0.5 % (ref 0.0–2.0)
BASOS ABS: 0 10*3/uL (ref 0.0–0.1)
EOS%: 1.4 % (ref 0.0–7.0)
Eosinophils Absolute: 0.1 10*3/uL (ref 0.0–0.5)
HCT: 43.9 % (ref 34.8–46.6)
HEMOGLOBIN: 14.6 g/dL (ref 11.6–15.9)
LYMPH#: 0.8 10*3/uL — AB (ref 0.9–3.3)
LYMPH%: 12.3 % — ABNORMAL LOW (ref 14.0–49.7)
MCH: 31.8 pg (ref 25.1–34.0)
MCHC: 33.2 g/dL (ref 31.5–36.0)
MCV: 95.6 fL (ref 79.5–101.0)
MONO#: 0.8 10*3/uL (ref 0.1–0.9)
MONO%: 12.2 % (ref 0.0–14.0)
NEUT%: 73.6 % (ref 38.4–76.8)
NEUTROS ABS: 4.6 10*3/uL (ref 1.5–6.5)
Platelets: 271 10*3/uL (ref 145–400)
RBC: 4.59 10*6/uL (ref 3.70–5.45)
RDW: 13.8 % (ref 11.2–14.5)
WBC: 6.3 10*3/uL (ref 3.9–10.3)

## 2013-09-23 LAB — COMPREHENSIVE METABOLIC PANEL (CC13)
ALBUMIN: 4 g/dL (ref 3.5–5.0)
ALT: 6 U/L (ref 0–55)
ANION GAP: 9 meq/L (ref 3–11)
AST: 14 U/L (ref 5–34)
Alkaline Phosphatase: 70 U/L (ref 40–150)
BUN: 10.3 mg/dL (ref 7.0–26.0)
CALCIUM: 9.1 mg/dL (ref 8.4–10.4)
CHLORIDE: 104 meq/L (ref 98–109)
CO2: 27 mEq/L (ref 22–29)
Creatinine: 0.8 mg/dL (ref 0.6–1.1)
GLUCOSE: 86 mg/dL (ref 70–140)
POTASSIUM: 4.1 meq/L (ref 3.5–5.1)
Sodium: 139 mEq/L (ref 136–145)
Total Bilirubin: 0.54 mg/dL (ref 0.20–1.20)
Total Protein: 7.3 g/dL (ref 6.4–8.3)

## 2013-09-23 MED ORDER — IOHEXOL 300 MG/ML  SOLN
80.0000 mL | Freq: Once | INTRAMUSCULAR | Status: AC | PRN
  Administered 2013-09-23: 80 mL via INTRAVENOUS

## 2013-09-25 ENCOUNTER — Encounter: Payer: Self-pay | Admitting: Internal Medicine

## 2013-09-25 ENCOUNTER — Telehealth: Payer: Self-pay | Admitting: Internal Medicine

## 2013-09-25 ENCOUNTER — Ambulatory Visit (HOSPITAL_BASED_OUTPATIENT_CLINIC_OR_DEPARTMENT_OTHER): Payer: Commercial Managed Care - HMO | Admitting: Internal Medicine

## 2013-09-25 VITALS — BP 120/72 | HR 104 | Temp 97.9°F | Resp 20 | Ht 64.0 in | Wt 123.6 lb

## 2013-09-25 DIAGNOSIS — J984 Other disorders of lung: Secondary | ICD-10-CM

## 2013-09-25 DIAGNOSIS — C3410 Malignant neoplasm of upper lobe, unspecified bronchus or lung: Secondary | ICD-10-CM

## 2013-09-25 NOTE — Progress Notes (Signed)
[facility] Telephone:([phone]   Fax:(336) [place], MD [place] [address]  DIAGNOSIS: Limited stage small cell lung cancer diagnosed in October of 2012 .   PRIOR THERAPY:  1) Status post palliative radiotherapy to the right hilum under the care Dr. Valere Dross.  2) Systemic chemotherapy with carboplatin for AUC of 5 on day 1 and etoposide 120 mg/M2 on days 1,2 and 3 with Neulasta support on day 4, status post 6 cycles.  3) Prophylactic cranial irradiation under the care of Dr. Valere Dross.  4) Curative stereotactic radiotherapy to right lung nodule suspicious for a stage IA non-small cell lung cancer under the care of Dr. Valere Dross completed on 10/09/2012.   CURRENT THERAPY: Observation.  INTERVAL HISTORY: Janice Daniels 68 y.o. female returns to the clinic today for routine three-month followup visit. The patient is feeling fine today with no specific complaints. She denied having any significant weight loss or night sweats. She has no chest pain, shortness breath, cough or hemoptysis. The patient had repeat CT scan of the head and chest performed recently and she is here for evaluation and discussion of her scan results.  MEDICAL HISTORY: Past Medical History  Diagnosis Date  . Hypertension   . Hypothyroid   . GERD (gastroesophageal reflux disease)   . COPD (chronic obstructive pulmonary disease)   . Low back pain   . Lung cancer 05/09/2011    RUL  . Depression   . History of radiation therapy 06/05/11 to 07/18/11    lung  . History of radiation therapy 09/13/11 to 09/26/11    brain  . Shingles   . Lung cancer, upper lobe 09/02/2012    rul  . History of chemotherapy     carboplatin and etoposide  . Hx of radiation therapy 10/03/12 - 10/09/12    RUL lung    ALLERGIES:  is allergic to codeine.  MEDICATIONS:  Current Outpatient Prescriptions  Medication Sig Dispense Refill  . ADVAIR DISKUS  250-50 MCG/DOSE AEPB Inhale 1 puff into the lungs as needed.      Marland Kitchen albuterol (PROVENTIL) (2.5 MG/3ML) 0.083% nebulizer solution Take 2.5 mg by nebulization every 4 (four) hours as needed.        Marland Kitchen albuterol (VENTOLIN HFA) 108 (90 BASE) MCG/ACT inhaler Inhale 2 puffs into the lungs every 6 (six) hours as needed.        . Cholecalciferol (VITAMIN D3 PO) Take 1 tablet by mouth every 30 (thirty) days.      . citalopram (CELEXA) 10 MG tablet Take 10 mg by mouth daily.        Marland Kitchen docusate sodium (COLACE) 100 MG capsule Take 100 mg by mouth 2 (two) times daily as needed.       . gabapentin (NEURONTIN) 600 MG tablet Take 600 mg by mouth daily. Per [facility] at Dr. Shon Baton office.      Marland Kitchen ibuprofen (ADVIL,MOTRIN) 200 MG tablet Take 200 mg by mouth every 6 (six) hours as needed for pain.      Marland Kitchen levothyroxine (SYNTHROID, LEVOTHROID) 112 MCG tablet Take 112 mcg by mouth daily.        Marland Kitchen loratadine (CLARITIN) 10 MG tablet Take 10 mg by mouth daily. prn       . montelukast (SINGULAIR) 10 MG tablet Take 10 mg by mouth daily.      . Multiple Vitamin (MULTIVITAMIN) tablet Take 1 tablet by mouth daily.        Marland Kitchen  prochlorperazine (COMPAZINE) 10 MG tablet Take 10 mg by mouth every 6 (six) hours as needed.        . ranitidine (ZANTAC) 150 MG capsule 150 mg daily.      Marland Kitchen tiotropium (SPIRIVA) 18 MCG inhalation capsule Place 18 mcg into inhaler and inhale daily as needed.       No current facility-administered medications for this visit.    SURGICAL HISTORY:  Past Surgical History  Procedure Laterality Date  . Tubal ligation  1984  . Total abdominal hysterectomy  1986  . Tonsillectomy    . Endobronchial biopsy Right 05/03/2011    REVIEW OF SYSTEMS:  A comprehensive review of systems was negative.   PHYSICAL EXAMINATION: General appearance: alert, cooperative and no distress Head: Normocephalic, without obvious abnormality, atraumatic Neck: no adenopathy, no JVD, supple, symmetrical, trachea midline and thyroid not  enlarged, symmetric, no tenderness/mass/nodules Lymph nodes: Cervical, supraclavicular, and axillary nodes normal. Resp: clear to auscultation bilaterally Back: symmetric, no curvature. ROM normal. No CVA tenderness. Cardio: regular rate and rhythm, S1, S2 normal, no murmur, click, rub or gallop GI: soft, non-tender; bowel sounds normal; no masses,  no organomegaly Extremities: extremities normal, atraumatic, no cyanosis or edema  ECOG PERFORMANCE STATUS: 1 - Symptomatic but completely ambulatory  Blood pressure 120/72, pulse 104, temperature 97.9 F (36.6 C), temperature source Oral, resp. rate 20, height 5\' 4"  (1.626 m), weight 123 lb 9.6 oz (56.065 kg), SpO2 97.00%.  LABORATORY DATA: Lab Results  Component Value Date   WBC 6.3 09/23/2013   HGB 14.6 09/23/2013   HCT 43.9 09/23/2013   MCV 95.6 09/23/2013   PLT 271 09/23/2013      Chemistry      Component Value Date/Time   NA 139 09/23/2013 1015   NA 143 11/17/2011 0847   NA 139 09/19/2011 1027   K 4.1 09/23/2013 1015   K 4.6 11/17/2011 0847   K 4.2 09/19/2011 1027   CL 105 12/17/2012 1006   CL 101 11/17/2011 0847   CL 105 09/19/2011 1027   CO2 27 09/23/2013 1015   CO2 30 11/17/2011 0847   CO2 28 09/19/2011 1027   BUN 10.3 09/23/2013 1015   BUN 13 11/17/2011 0847   BUN 15 09/19/2011 1027   CREATININE 0.8 09/23/2013 1015   CREATININE 0.8 11/17/2011 0847   CREATININE 0.75 09/19/2011 1027      Component Value Date/Time   CALCIUM 9.1 09/23/2013 1015   CALCIUM 9.0 11/17/2011 0847   CALCIUM 8.9 09/19/2011 1027   ALKPHOS 70 09/23/2013 1015   ALKPHOS 60 11/17/2011 0847   ALKPHOS 65 09/19/2011 1027   AST 14 09/23/2013 1015   AST 23 11/17/2011 0847   AST 12 09/19/2011 1027   ALT 6 09/23/2013 1015   ALT 17 11/17/2011 0847   ALT <8 09/19/2011 1027   BILITOT 0.54 09/23/2013 1015   BILITOT 0.60 11/17/2011 0847   BILITOT 0.3 09/19/2011 1027       RADIOGRAPHIC STUDIES: Ct Chest W Contrast  09/23/2013   CLINICAL DATA:  Right upper lobe lung cancer  diagnosed in September, 2012. Completed chemotherapy and radiation therapy.  EXAM: CT CHEST WITH CONTRAST  TECHNIQUE: Multidetector CT imaging of the chest was performed during intravenous contrast administration.  CONTRAST:  77mL OMNIPAQUE IOHEXOL 300 MG/ML  SOLN  COMPARISON:  CT CHEST W/CM dated 03/19/2013; NM PET IMAGE RESTAG (PS) SKULL BASE TO THIGH dated 09/06/2012; CT CHEST W/CM dated 03/22/2011  FINDINGS: Generalized increased soft tissue density around the right  upper lobe central bronchial branches observed, difficult to quantify but readily apparent on inspection. Images 17-23 of series 5 a representative. Appearance suspicious for recurrence. 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  Increased patchy coarse interstitial opacity in the right upper lobe obscures the prior small right upper lobe nodule. Scarring noted along the minor fissure laterally. Underlying emphysema is present.  There is airway thickening in the left lower lobe as on image 45 of series 5. There is some mild airway thickening in the left upper lobe as well.  IMPRESSION: 1. Increased soft tissue density surrounding the right upper lobe bronchus and its branches, which demonstrate varicoid bronchiectasis. Appearance concerning for recurrent malignancy although could potentially be inflammatory. 2. Increasing right upper lobe interstitial and indistinct airspace opacity obscures the previous small nodule. Appearance suspicious for pneumonia. 3. Emphysema. 4. Scattered airway thickening in the left lung.   Electronically Signed   By: Sherryl Barters M.D.   On: 09/23/2013 12:57   Mm Digital Screening Bilateral  09/05/2013   CLINICAL DATA:  Screening.  EXAM: DIGITAL SCREENING BILATERAL MAMMOGRAM WITH CAD  COMPARISON:  Previous exam(s).  ACR Breast Density Category b: There are scattered areas of fibroglandular density.  FINDINGS: There are no findings suspicious for malignancy.  Images were processed with CAD.  IMPRESSION: No mammographic evidence of malignancy. A result letter of this screening mammogram will be mailed directly to the patient.  RECOMMENDATION: Screening mammogram in one year. (Code:SM-B-01Y)  BI-RADS CATEGORY  1: Negative.   Electronically Signed   By: Lovey Newcomer M.D.   On: 09/05/2013 09:27   ASSESSMENT AND PLAN: This is a very pleasant 68 years old white female with history of limited stage small cell lung cancer diagnosed in October of 2012 status post systemic chemotherapy as well as palliative radiotherapy to the right hilum with prophylactic cranial irradiation and curative radiotherapy to right lung nodule. The patient is doing fine today but her recent CT scan of the chest showed increased soft tissue density surrounding the right upper lobe bronchus and its branches concerning for recurrent malignancy.  I discussed the scan results with the patient I gave her the option of continuous observation with repeat CT scan of the chest in 2 months versus proceeding with a PET scan for further characterization of this abnormality. The patient is concerned and she would like to have a PET scan performed. I would see her back for follow up visit in 2 weeks for reevaluation after repeating a PET scan.  She was advised to call immediately if she has any concerning symptoms in the interval.  The patient voices understanding of current disease status and treatment options and is in agreement with the current care plan.  All questions were answered. The patient knows to call the clinic with any problems, questions or concerns. We can certainly see the patient much sooner if necessary.  Disclaimer: This note was dictated with voice recognition software. Similar sounding words can inadvertently be transcribed and may not be corrected upon review.

## 2013-09-25 NOTE — Telephone Encounter (Signed)
gv pt appt schedule for april. [facility] will call w/pet scan -pt aware.

## 2013-10-06 ENCOUNTER — Encounter (HOSPITAL_COMMUNITY): Payer: Self-pay

## 2013-10-06 ENCOUNTER — Encounter (HOSPITAL_COMMUNITY)
Admission: RE | Admit: 2013-10-06 | Discharge: 2013-10-06 | Disposition: A | Discharge status: Home / Self Care | Payer: Medicare HMO | Source: Ambulatory Visit | Attending: Internal Medicine | Admitting: Internal Medicine

## 2013-10-06 DIAGNOSIS — C3410 Malignant neoplasm of upper lobe, unspecified bronchus or lung: Secondary | ICD-10-CM | POA: Insufficient documentation

## 2013-10-06 LAB — GLUCOSE, CAPILLARY: GLUCOSE-CAPILLARY: 91 mg/dL (ref 70–99)

## 2013-10-06 MED ORDER — FLUDEOXYGLUCOSE F - 18 (FDG) INJECTION
6.8000 | Freq: Once | INTRAVENOUS | Status: AC | PRN
  Administered 2013-10-06: 6.8 via INTRAVENOUS

## 2013-10-14 ENCOUNTER — Encounter: Payer: Self-pay | Admitting: Internal Medicine

## 2013-10-14 ENCOUNTER — Ambulatory Visit (HOSPITAL_BASED_OUTPATIENT_CLINIC_OR_DEPARTMENT_OTHER): Payer: Commercial Managed Care - HMO | Admitting: Internal Medicine

## 2013-10-14 VITALS — BP 149/86 | HR 114 | Temp 98.0°F | Resp 20 | Ht 64.0 in | Wt 121.7 lb

## 2013-10-14 DIAGNOSIS — C3490 Malignant neoplasm of unspecified part of unspecified bronchus or lung: Secondary | ICD-10-CM

## 2013-10-14 DIAGNOSIS — M799 Soft tissue disorder, unspecified: Secondary | ICD-10-CM

## 2013-10-14 DIAGNOSIS — C3410 Malignant neoplasm of upper lobe, unspecified bronchus or lung: Secondary | ICD-10-CM

## 2013-10-14 NOTE — Progress Notes (Signed)
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   PRIOR THERAPY:  1) Status post palliative radiotherapy to the right hilum under the care Dr. Valere Dross.  2) Systemic chemotherapy with carboplatin for AUC of 5 on day 1 and etoposide 120 mg/M2 on days 1,2 and 3 with Neulasta support on day 4, status post 6 cycles.  3) Prophylactic cranial irradiation under the care of Dr. Valere Dross.  4) Curative stereotactic radiotherapy to right lung nodule suspicious for a stage IA non-small cell lung cancer under the care of Dr. Valere Dross completed on 10/09/2012.   CURRENT THERAPY: Observation.  INTERVAL HISTORY: Janice Daniels 68 y.o. female returns to the clinic today for followup visit. The patient is feeling fine today with no specific complaints. She denied having any significant weight loss or night sweats. She has no chest pain, shortness of breath with exertion, cough or hemoptysis. She has no nausea or vomiting, no fever or chills. She was found on recent CT scan of the chest to have an enlarging soft tissue mass in the right upper lobe. I ordered a PET scan and the patient is here today for evaluation and discussion of her scan results.   MEDICAL HISTORY: Past Medical History  Diagnosis Date  . Hypertension   . Hypothyroid   . GERD (gastroesophageal reflux disease)   . COPD (chronic obstructive pulmonary disease)   . Low back pain   . Lung cancer 05/09/2011    RUL  . Depression   . History of radiation therapy 06/05/11 to 07/18/11    lung  . History of radiation therapy 09/13/11 to 09/26/11    brain  . Shingles   . Lung cancer, upper lobe 09/02/2012    rul  . History of chemotherapy     carboplatin and etoposide  . Hx of radiation therapy 10/03/12 - 10/09/12    RUL lung    ALLERGIES:  is  allergic to codeine.  MEDICATIONS:  Current Outpatient Prescriptions  Medication Sig Dispense Refill  . ADVAIR DISKUS 250-50 MCG/DOSE AEPB Inhale 1 puff into the lungs as needed.      Marland Kitchen albuterol (PROVENTIL) (2.5 MG/3ML) 0.083% nebulizer solution Take 2.5 mg by nebulization every 4 (four) hours as needed.        Marland Kitchen albuterol (VENTOLIN HFA) 108 (90 BASE) MCG/ACT inhaler Inhale 2 puffs into the lungs every 6 (six) hours as needed.        . Cholecalciferol (VITAMIN D3 PO) Take 1 tablet by mouth every 30 (thirty) days.      . citalopram (CELEXA) 10 MG tablet Take 10 mg by mouth daily.        Marland Kitchen docusate sodium (COLACE) 100 MG capsule Take 100 mg by mouth 2 (two) times daily as needed.       . gabapentin (NEURONTIN) 600 MG tablet Take 600 mg by mouth daily. Per [facility] at Dr. Shon Baton office.      Marland Kitchen ibuprofen (ADVIL,MOTRIN) 200 MG tablet Take 200 mg by mouth every 6 (six) hours as needed for pain.      Marland Kitchen levothyroxine (SYNTHROID, LEVOTHROID) 112 MCG tablet Take 112 mcg by mouth daily.        Marland Kitchen loratadine (CLARITIN) 10 MG tablet Take 10 mg by mouth daily. prn       .  montelukast (SINGULAIR) 10 MG tablet Take 10 mg by mouth daily.      . Multiple Vitamin (MULTIVITAMIN) tablet Take 1 tablet by mouth daily.        . prochlorperazine (COMPAZINE) 10 MG tablet Take 10 mg by mouth every 6 (six) hours as needed.        . ranitidine (ZANTAC) 150 MG capsule 150 mg daily.      Marland Kitchen tiotropium (SPIRIVA) 18 MCG inhalation capsule Place 18 mcg into inhaler and inhale daily as needed.       No current facility-administered medications for this visit.    SURGICAL HISTORY:  Past Surgical History  Procedure Laterality Date  . Tubal ligation  1984  . Total abdominal hysterectomy  1986  . Tonsillectomy    . Endobronchial biopsy Right 05/03/2011    REVIEW OF SYSTEMS:  A comprehensive review of systems was negative.   PHYSICAL EXAMINATION: General appearance: alert, cooperative and no distress Head: Normocephalic,  without obvious abnormality, atraumatic Neck: no adenopathy, no JVD, supple, symmetrical, trachea midline and thyroid not enlarged, symmetric, no tenderness/mass/nodules Lymph nodes: Cervical, supraclavicular, and axillary nodes normal. Resp: clear to auscultation bilaterally Back: symmetric, no curvature. ROM normal. No CVA tenderness. Cardio: regular rate and rhythm, S1, S2 normal, no murmur, click, rub or gallop GI: soft, non-tender; bowel sounds normal; no masses,  no organomegaly Extremities: extremities normal, atraumatic, no cyanosis or edema  ECOG PERFORMANCE STATUS: 1 - Symptomatic but completely ambulatory  Blood pressure 149/86, pulse 114, temperature 98 F (36.7 C), temperature source Oral, resp. rate 20, height 5\' 4"  (1.626 m), weight 121 lb 11.2 oz (55.203 kg).  LABORATORY DATA: Lab Results  Component Value Date   WBC 6.3 09/23/2013   HGB 14.6 09/23/2013   HCT 43.9 09/23/2013   MCV 95.6 09/23/2013   PLT 271 09/23/2013      Chemistry      Component Value Date/Time   NA 139 09/23/2013 1015   NA 143 11/17/2011 0847   NA 139 09/19/2011 1027   K 4.1 09/23/2013 1015   K 4.6 11/17/2011 0847   K 4.2 09/19/2011 1027   CL 105 12/17/2012 1006   CL 101 11/17/2011 0847   CL 105 09/19/2011 1027   CO2 27 09/23/2013 1015   CO2 30 11/17/2011 0847   CO2 28 09/19/2011 1027   BUN 10.3 09/23/2013 1015   BUN 13 11/17/2011 0847   BUN 15 09/19/2011 1027   CREATININE 0.8 09/23/2013 1015   CREATININE 0.8 11/17/2011 0847   CREATININE 0.75 09/19/2011 1027      Component Value Date/Time   CALCIUM 9.1 09/23/2013 1015   CALCIUM 9.0 11/17/2011 0847   CALCIUM 8.9 09/19/2011 1027   ALKPHOS 70 09/23/2013 1015   ALKPHOS 60 11/17/2011 0847   ALKPHOS 65 09/19/2011 1027   AST 14 09/23/2013 1015   AST 23 11/17/2011 0847   AST 12 09/19/2011 1027   ALT 6 09/23/2013 1015   ALT 17 11/17/2011 0847   ALT <8 09/19/2011 1027   BILITOT 0.54 09/23/2013 1015   BILITOT 0.60 11/17/2011 0847   BILITOT 0.3 09/19/2011 1027        RADIOGRAPHIC STUDIES: Ct Chest W Contrast  09/23/2013   CLINICAL DATA:  Right upper lobe lung cancer diagnosed in September, 2012. Completed chemotherapy and radiation therapy.  EXAM: CT CHEST WITH CONTRAST  TECHNIQUE: Multidetector CT imaging of the chest was performed during intravenous contrast administration.  CONTRAST:  50mL OMNIPAQUE IOHEXOL 300 MG/ML  SOLN  COMPARISON:  CT CHEST W/CM dated 03/19/2013; NM PET IMAGE RESTAG (PS) SKULL BASE TO THIGH dated 09/06/2012; CT CHEST W/CM dated 03/22/2011  FINDINGS: Generalized increased soft tissue density around the right upper lobe central bronchial branches observed, difficult to quantify but readily apparent on inspection. Images 17-23 of series 5 a representative. Appearance suspicious for recurrence. 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  Increased patchy coarse interstitial opacity in the right upper lobe obscures the prior small right upper lobe nodule. Scarring noted along the minor fissure laterally. Underlying emphysema is present.  There is airway thickening in the left lower lobe as on image 45 of series 5. There is some mild airway thickening in the left upper lobe as well.  IMPRESSION: 1. Increased soft tissue density surrounding the right upper lobe bronchus and its branches, which demonstrate varicoid bronchiectasis. Appearance concerning for recurrent malignancy although could potentially be inflammatory. 2. Increasing right upper lobe interstitial and indistinct airspace opacity obscures the previous small nodule. Appearance suspicious for pneumonia. 3. Emphysema. 4. Scattered airway thickening in the left lung.   Electronically Signed   By: Sherryl Barters M.D.   On: 09/23/2013 12:57   Nm Pet Image Restag (ps) Skull Base To Thigh  10/06/2013   CLINICAL DATA:  Subsequent treatment strategy for upper lobe lung cancer, restaging. Limited stage small Cell. Right hilar  radiotherapy. Systemic chemotherapy. Stereotactic radiotherapy to right lung nodule on 10/09/2012. Abnormal chest CT demonstrating increased soft tissue thickening around the right upper lobe bronchus.  EXAM: NUCLEAR MEDICINE PET SKULL BASE TO THIGH  TECHNIQUE: 6.8 mCi F-18 FDG was injected intravenously. Full-ring PET imaging was performed from the skull base to thigh after the radiotracer. CT data was obtained and used for attenuation correction and anatomic localization.  FASTING BLOOD GLUCOSE:  Value: 91 mg/dl  COMPARISON:  NM PET IMAGE RESTAG (PS) SKULL BASE TO THIGH dated 09/06/2012; CT CHEST W/CM dated 09/23/2013; CT CHEST W/CM dated 03/19/2013  FINDINGS: NECK  No areas of abnormal hypermetabolism.  CHEST  Hypermetabolism which corresponds to soft tissue thickening surrounding right upper lobe bronchi. This is relatively diffuse, and measures a S.U.V. max of 3.9. Example image 23/series 8. No focal abnormality or significant mass effect to strongly suggest recurrent disease.  Separate area of presumed radiation fibrosis in the right apex (at the site of the nodule on 09/06/2012) demonstrates low-level hypermetabolism. This measures a S.U.V. max of 2.1, including on image 11. No well-defined nodule in this area.  Low-level hypermetabolism about the right pectoral is musculature is favored to the radiation induced (versus less likely due to motion after radiopharmaceutical injection). This measures a S.U.V. max of 3.7, including on image 52. . No correlate CT abnormality.  ABDOMEN/PELVIS  No areas of abnormal hypermetabolism.  SKELETON  No abnormal marrow activity.  CT IMAGES PERFORMED FOR ATTENUATION CORRECTION  No significant findings within the neck. Chest findings deferred to recent diagnostic CT. Aortic and branch vessel atherosclerosis. Multivessel coronary artery atherosclerosis. No acute superimposed process. Hysterectomy. Moderate osteopenia.  IMPRESSION: 1. Relatively diffuse low-level hypermetabolism  corresponding to soft tissue thickening surrounding right upper lobe bronchi. Favor evolving radiation changes. Recommend followup with chest CT at 3 months to confirm stability. 2. Separate area of presumed radiation change in the right apex. This demonstrates low-level hyper metabolism and is without residual well-defined nodule. 3. No evidence of extrathoracic disease. 4.  Atherosclerosis, including within the coronary arteries.   Electronically Signed   By: Marylyn Ishihara  Jobe Igo M.D.   On: 10/06/2013 16:12   ASSESSMENT AND PLAN: This is a very pleasant 68 years old white female with history of limited stage small cell lung cancer diagnosed in October of 2012 status post systemic chemotherapy as well as palliative radiotherapy to the right hilum with prophylactic cranial irradiation and curative radiotherapy to right lung nodule. The PET scan today showed no hypermetabolic activity in the right upper lobe soft tissue mass. I discussed the scan results with the patient and recommended for her to continue on observation with repeat CT scan of the chest in 3 months. She was advised to call immediately if she has any concerning symptoms in the interval.  The patient voices understanding of current disease status and treatment options and is in agreement with the current care plan.  All questions were answered. The patient knows to call the clinic with any problems, questions or concerns. We can certainly see the patient much sooner if necessary.  Disclaimer: This note was dictated with voice recognition software. Similar sounding words can inadvertently be transcribed and may not be corrected upon review.

## 2013-10-15 ENCOUNTER — Telehealth: Payer: Self-pay | Admitting: Internal Medicine

## 2013-10-15 NOTE — Telephone Encounter (Signed)
s.w. pt and advised on July appts....pt ok and aware

## 2014-01-13 ENCOUNTER — Other Ambulatory Visit (HOSPITAL_BASED_OUTPATIENT_CLINIC_OR_DEPARTMENT_OTHER): Payer: Medicare HMO

## 2014-01-13 ENCOUNTER — Ambulatory Visit (HOSPITAL_COMMUNITY)
Admission: RE | Admit: 2014-01-13 | Discharge: 2014-01-13 | Disposition: A | Discharge status: Home / Self Care | Payer: Medicare HMO | Source: Ambulatory Visit | Attending: Internal Medicine | Admitting: Internal Medicine

## 2014-01-13 DIAGNOSIS — R059 Cough, unspecified: Secondary | ICD-10-CM | POA: Insufficient documentation

## 2014-01-13 DIAGNOSIS — J438 Other emphysema: Secondary | ICD-10-CM | POA: Insufficient documentation

## 2014-01-13 DIAGNOSIS — Z9221 Personal history of antineoplastic chemotherapy: Secondary | ICD-10-CM | POA: Insufficient documentation

## 2014-01-13 DIAGNOSIS — C3410 Malignant neoplasm of upper lobe, unspecified bronchus or lung: Secondary | ICD-10-CM

## 2014-01-13 DIAGNOSIS — R05 Cough: Secondary | ICD-10-CM | POA: Insufficient documentation

## 2014-01-13 DIAGNOSIS — C3490 Malignant neoplasm of unspecified part of unspecified bronchus or lung: Secondary | ICD-10-CM | POA: Insufficient documentation

## 2014-01-13 DIAGNOSIS — R091 Pleurisy: Secondary | ICD-10-CM | POA: Insufficient documentation

## 2014-01-13 DIAGNOSIS — J479 Bronchiectasis, uncomplicated: Secondary | ICD-10-CM | POA: Insufficient documentation

## 2014-01-13 DIAGNOSIS — I2510 Atherosclerotic heart disease of native coronary artery without angina pectoris: Secondary | ICD-10-CM | POA: Insufficient documentation

## 2014-01-13 LAB — COMPREHENSIVE METABOLIC PANEL (CC13)
ALT: 8 U/L (ref 0–55)
ANION GAP: 8 meq/L (ref 3–11)
AST: 15 U/L (ref 5–34)
Albumin: 3.8 g/dL (ref 3.5–5.0)
Alkaline Phosphatase: 72 U/L (ref 40–150)
BILIRUBIN TOTAL: 0.55 mg/dL (ref 0.20–1.20)
BUN: 8.8 mg/dL (ref 7.0–26.0)
CALCIUM: 9.1 mg/dL (ref 8.4–10.4)
CHLORIDE: 106 meq/L (ref 98–109)
CO2: 28 meq/L (ref 22–29)
Creatinine: 0.9 mg/dL (ref 0.6–1.1)
Glucose: 125 mg/dl (ref 70–140)
Potassium: 4 mEq/L (ref 3.5–5.1)
Sodium: 142 mEq/L (ref 136–145)
Total Protein: 7.1 g/dL (ref 6.4–8.3)

## 2014-01-13 LAB — CBC WITH DIFFERENTIAL/PLATELET
BASO%: 0.7 % (ref 0.0–2.0)
Basophils Absolute: 0 10*3/uL (ref 0.0–0.1)
EOS%: 1.3 % (ref 0.0–7.0)
Eosinophils Absolute: 0.1 10*3/uL (ref 0.0–0.5)
HCT: 44 % (ref 34.8–46.6)
HGB: 14.4 g/dL (ref 11.6–15.9)
LYMPH#: 0.8 10*3/uL — AB (ref 0.9–3.3)
LYMPH%: 12.4 % — AB (ref 14.0–49.7)
MCH: 31.4 pg (ref 25.1–34.0)
MCHC: 32.8 g/dL (ref 31.5–36.0)
MCV: 95.8 fL (ref 79.5–101.0)
MONO#: 0.6 10*3/uL (ref 0.1–0.9)
MONO%: 9.9 % (ref 0.0–14.0)
NEUT#: 4.8 10*3/uL (ref 1.5–6.5)
NEUT%: 75.7 % (ref 38.4–76.8)
Platelets: 270 10*3/uL (ref 145–400)
RBC: 4.59 10*6/uL (ref 3.70–5.45)
RDW: 13.6 % (ref 11.2–14.5)
WBC: 6.3 10*3/uL (ref 3.9–10.3)

## 2014-01-13 MED ORDER — IOHEXOL 300 MG/ML  SOLN
80.0000 mL | Freq: Once | INTRAMUSCULAR | Status: AC | PRN
  Administered 2014-01-13: 80 mL via INTRAVENOUS

## 2014-01-20 ENCOUNTER — Telehealth: Payer: Self-pay | Admitting: Internal Medicine

## 2014-01-20 ENCOUNTER — Encounter: Payer: Self-pay | Admitting: Internal Medicine

## 2014-01-20 ENCOUNTER — Ambulatory Visit (HOSPITAL_BASED_OUTPATIENT_CLINIC_OR_DEPARTMENT_OTHER): Payer: Commercial Managed Care - HMO | Admitting: Internal Medicine

## 2014-01-20 VITALS — BP 138/84 | HR 115 | Temp 98.1°F | Resp 18 | Ht 64.0 in | Wt 120.0 lb

## 2014-01-20 DIAGNOSIS — C3491 Malignant neoplasm of unspecified part of right bronchus or lung: Secondary | ICD-10-CM

## 2014-01-20 DIAGNOSIS — Z85118 Personal history of other malignant neoplasm of bronchus and lung: Secondary | ICD-10-CM

## 2014-01-20 NOTE — Telephone Encounter (Signed)
Pt confirmed labs/ov per 07/28 POF, gave pt AVS....KJ

## 2014-01-20 NOTE — Progress Notes (Signed)
[facility] [phone]   Fax:(336) [place], [place] [address]  DIAGNOSIS: Limited stage small cell lung cancer diagnosed in October of 2012 .   PRIOR THERAPY:  1) Status post palliative radiotherapy to the right hilum under the care Dr. Valere Dross.  2) Systemic chemotherapy with carboplatin for AUC of 5 on day 1 and etoposide 120 mg/M2 on days 1,2 and 3 with Neulasta support on day 4, status post 6 cycles.  3) Prophylactic cranial irradiation under the care of Dr. Valere Dross.  4) Curative stereotactic radiotherapy to right lung nodule suspicious for a stage IA non-small cell lung cancer under the care of Dr. Valere Dross completed on 10/09/2012.   CURRENT THERAPY: Observation.  INTERVAL HISTORY: Janice Daniels 67 y.o. female returns to the clinic today for three-month followup visit. The patient has been observation since April 2014. The patient is feeling fine today with no specific complaints. She denied having any significant weight loss or night sweats. She has no chest pain, shortness of breath with exertion, cough or hemoptysis. She has no nausea or vomiting, no fever or chills. She had repeat CT scan of the chest performed recently and she is here for evaluation and discussion of her scan results.  MEDICAL HISTORY: Past Medical History  Diagnosis Date  . Hypertension   . Hypothyroid   . GERD (gastroesophageal reflux disease)   . COPD (chronic obstructive pulmonary disease)   . Low back pain   . Lung cancer 05/09/2011    RUL  . Depression   . History of radiation therapy 06/05/11 to 07/18/11    lung  . History of radiation therapy 09/13/11 to 09/26/11    brain  . Shingles   . Lung cancer, upper lobe 09/02/2012    rul  . History of chemotherapy     carboplatin and etoposide  . Hx of radiation therapy 10/03/12 - 10/09/12    RUL lung    ALLERGIES:  is allergic to  codeine.  MEDICATIONS:  Current Outpatient Prescriptions  Medication Sig Dispense Refill  . ADVAIR DISKUS 250-50 MCG/DOSE AEPB Inhale 1 puff into the lungs as needed.      Marland Kitchen albuterol (VENTOLIN HFA) 108 (90 BASE) MCG/ACT inhaler Inhale 2 puffs into the lungs every 6 (six) hours as needed.        . Cholecalciferol (VITAMIN D3 PO) Take 1 tablet by mouth every 30 (thirty) days.      . citalopram (CELEXA) 10 MG tablet Take 10 mg by mouth daily.        Marland Kitchen docusate sodium (COLACE) 100 MG capsule Take 100 mg by mouth 2 (two) times daily as needed.       . gabapentin (NEURONTIN) 600 MG tablet Take 600 mg by mouth daily. Per [facility] at Dr. Shon Baton office.      Marland Kitchen levothyroxine (SYNTHROID, LEVOTHROID) 112 MCG tablet Take 112 mcg by mouth daily.        Marland Kitchen loratadine (CLARITIN) 10 MG tablet Take 10 mg by mouth daily. prn       . montelukast (SINGULAIR) 10 MG tablet Take 10 mg by mouth daily.      . Multiple Vitamin (MULTIVITAMIN) tablet Take 1 tablet by mouth daily.        . ranitidine (ZANTAC) 150 MG capsule 150 mg daily.      Marland Kitchen albuterol (PROVENTIL) (2.5 MG/3ML) 0.083% nebulizer solution Take 2.5 mg by nebulization  every 4 (four) hours as needed.        Marland Kitchen ibuprofen (ADVIL,MOTRIN) 200 MG tablet Take 200 mg by mouth every 6 (six) hours as needed for pain.      Marland Kitchen tiotropium (SPIRIVA) 18 MCG inhalation capsule Place 18 mcg into inhaler and inhale daily as needed.       No current facility-administered medications for this visit.    SURGICAL HISTORY:  Past Surgical History  Procedure Laterality Date  . Tubal ligation  1984  . Total abdominal hysterectomy  1986  . Tonsillectomy    . Endobronchial biopsy Right 05/03/2011    REVIEW OF SYSTEMS:  A comprehensive review of systems was negative.   PHYSICAL EXAMINATION: General appearance: alert, cooperative and no distress Head: Normocephalic, without obvious abnormality, atraumatic Neck: no adenopathy, no JVD, supple, symmetrical, trachea midline and  thyroid not enlarged, symmetric, no tenderness/mass/nodules Lymph nodes: Cervical, supraclavicular, and axillary nodes normal. Resp: clear to auscultation bilaterally Back: symmetric, no curvature. ROM normal. No CVA tenderness. Cardio: regular rate and rhythm, S1, S2 normal, no murmur, click, rub or gallop GI: soft, non-tender; bowel sounds normal; no masses,  no organomegaly Extremities: extremities normal, atraumatic, no cyanosis or edema  ECOG PERFORMANCE STATUS: 1 - Symptomatic but completely ambulatory  Blood pressure 138/84, pulse 115, temperature 98.1 F (36.7 C), temperature source Oral, resp. rate 18, height 5\' 4"  (1.626 m), weight 120 lb (54.432 kg).  LABORATORY DATA: Lab Results  Component Value Date   WBC 6.3 01/13/2014   HGB 14.4 01/13/2014   HCT 44.0 01/13/2014   MCV 95.8 01/13/2014   PLT 270 01/13/2014      Chemistry      Component Value Date/Time   NA 142 01/13/2014 0947   NA 143 11/17/2011 0847   NA 139 09/19/2011 1027   K 4.0 01/13/2014 0947   K 4.6 11/17/2011 0847   K 4.2 09/19/2011 1027   CL 105 12/17/2012 1006   CL 101 11/17/2011 0847   CL 105 09/19/2011 1027   CO2 28 01/13/2014 0947   CO2 30 11/17/2011 0847   CO2 28 09/19/2011 1027   BUN 8.8 01/13/2014 0947   BUN 13 11/17/2011 0847   BUN 15 09/19/2011 1027   CREATININE 0.9 01/13/2014 0947   CREATININE 0.8 11/17/2011 0847   CREATININE 0.75 09/19/2011 1027      Component Value Date/Time   CALCIUM 9.1 01/13/2014 0947   CALCIUM 9.0 11/17/2011 0847   CALCIUM 8.9 09/19/2011 1027   ALKPHOS 72 01/13/2014 0947   ALKPHOS 60 11/17/2011 0847   ALKPHOS 65 09/19/2011 1027   AST 15 01/13/2014 0947   AST 23 11/17/2011 0847   AST 12 09/19/2011 1027   ALT 8 01/13/2014 0947   ALT 17 11/17/2011 0847   ALT <8 09/19/2011 1027   BILITOT 0.55 01/13/2014 0947   BILITOT 0.60 11/17/2011 0847   BILITOT 0.3 09/19/2011 1027       RADIOGRAPHIC STUDIES: Ct Chest W Contrast  01/13/2014   CLINICAL DATA:  Restaging lung cancer. Diagnosis 2012.  Chronic cough. Prior chemotherapy.  EXAM: CT CHEST WITH CONTRAST  TECHNIQUE: Multidetector CT imaging of the chest was performed during intravenous contrast administration.  CONTRAST:  38mL OMNIPAQUE IOHEXOL 300 MG/ML  SOLN  COMPARISON:  PET CT 10/06/2013.  Chest CT 09/23/2013.  FINDINGS: Again noted is the soft tissue density surrounding the central right upper lobe bronchi. This is difficult to quantify but measures approximately 4.7 x 3.3 cm on image 19 of series  5. This compares with 4.4 x 3.5 cm previously and is felt to be not significantly changed. Areas of adjacent pleural thickening posteriorly are stable. Stable interstitial thickening in the upper lobes superior to the thickened airways. These findings most likely reflect postradiation changes. Associated bronchiectasis also noted, stable. Centrilobular and paraseptal emphysema changes noted in the upper lobes bilaterally.  No pleural effusions. No new or enlarging pulmonary nodules. No mediastinal, hilar or axillary adenopathy.  Heart is normal size. Aorta is normal caliber. Scattered coronary artery calcifications. Chest wall soft tissues are unremarkable. Imaging into the upper abdomen shows no acute findings.  No acute bony abnormality or focal bone lesion.  IMPRESSION: Stable soft tissue thickening around the right upper lobe bronchi with associated bronchiectasis, adjacent interstitial thickening and pleural thickening. Findings are unchanged since prior study and likely reflect sequela of prior radiation treatment.  Stable emphysema.  Coronary artery disease.   Electronically Signed   By: Rolm Baptise M.D.   On: 01/13/2014 12:35   ASSESSMENT AND PLAN: This is a very pleasant 68 years old white female with history of limited stage small cell lung cancer diagnosed in October of 2012 status post systemic chemotherapy as well as palliative radiotherapy to the right hilum with prophylactic cranial irradiation and curative radiotherapy to right lung  nodule. Her recent CT scan of the chest showed no evidence for disease progression. I discussed the scan results with the patient and recommended for her to continue on observation with repeat CT scan of the chest in 6 months. She was advised to call immediately if she has any concerning symptoms in the interval.  The patient voices understanding of current disease status and treatment options and is in agreement with the current care plan.  All questions were answered. The patient knows to call the clinic with any problems, questions or concerns. We can certainly see the patient much sooner if necessary.  Disclaimer: This note was dictated with voice recognition software. Similar sounding words can inadvertently be transcribed and may not be corrected upon review.

## 2014-01-20 NOTE — Patient Instructions (Signed)
You Can Quit Smoking If you are ready to quit smoking or are thinking about it, congratulations! You have chosen to help yourself be healthier and live longer! There are lots of different ways to quit smoking. Nicotine gum, nicotine patches, a nicotine inhaler, or nicotine nasal spray can help with physical craving. Hypnosis, support groups, and medicines help break the habit of smoking. TIPS TO GET OFF AND STAY OFF CIGARETTES  Learn to predict your moods. Do not let a bad situation be your excuse to have a cigarette. Some situations in your life might tempt you to have a cigarette.  Ask friends and co-workers not to smoke around you.  Make your home smoke-free.  Never have "just one" cigarette. It leads to wanting another and another. Remind yourself of your decision to quit.  On a card, make a list of your reasons for not smoking. Read it at least the same number of times a day as you have a cigarette. Tell yourself everyday, "I do not want to smoke. I choose not to smoke."  Ask someone at home or work to help you with your plan to quit smoking.  Have something planned after you eat or have a cup of coffee. Take a walk or get other exercise to perk you up. This will help to keep you from overeating.  Try a relaxation exercise to calm you down and decrease your stress. Remember, you may be tense and nervous the first two weeks after you quit. This will pass.  Find new activities to keep your hands busy. Play with a pen, coin, or rubber band. Doodle or draw things on paper.  Brush your teeth right after eating. This will help cut down the craving for the taste of tobacco after meals. You can try mouthwash too.  Try gum, breath mints, or diet candy to keep something in your mouth. IF YOU SMOKE AND WANT TO QUIT:  Do not stock up on cigarettes. Never buy a carton. Wait until one pack is finished before you buy another.  Never carry cigarettes with you at work or at home.  Keep cigarettes  as far away from you as possible. Leave them with someone else.  Never carry matches or a lighter with you.  Ask yourself, "Do I need this cigarette or is this just a reflex?"  Bet with someone that you can quit. Put cigarette money in a piggy bank every morning. If you smoke, you give up the money. If you do not smoke, by the end of the week, you keep the money.  Keep trying. It takes 21 days to change a habit!  Talk to your doctor about using medicines to help you quit. These include nicotine replacement gum, lozenges, or skin patches. Document Released: 04/08/2009 Document Revised: 09/04/2011 Document Reviewed: 04/08/2009 [employer] Patient Information 2015 [employer]. This information is not intended to replace advice given to you by your health care provider. Make sure you discuss any questions you have with your health care provider.  

## 2014-02-17 ENCOUNTER — Encounter (HOSPITAL_COMMUNITY): Payer: Self-pay | Admitting: Emergency Medicine

## 2014-02-17 ENCOUNTER — Emergency Department (HOSPITAL_COMMUNITY)
Admission: EM | Admit: 2014-02-17 | Discharge: 2014-02-17 | Disposition: A | Discharge status: Home / Self Care | Payer: Medicare HMO | Attending: Emergency Medicine | Admitting: Emergency Medicine

## 2014-02-17 ENCOUNTER — Emergency Department (HOSPITAL_COMMUNITY): Payer: Medicare HMO

## 2014-02-17 DIAGNOSIS — Z9221 Personal history of antineoplastic chemotherapy: Secondary | ICD-10-CM | POA: Insufficient documentation

## 2014-02-17 DIAGNOSIS — Z85118 Personal history of other malignant neoplasm of bronchus and lung: Secondary | ICD-10-CM | POA: Insufficient documentation

## 2014-02-17 DIAGNOSIS — I10 Essential (primary) hypertension: Secondary | ICD-10-CM | POA: Diagnosis not present

## 2014-02-17 DIAGNOSIS — Z79899 Other long term (current) drug therapy: Secondary | ICD-10-CM | POA: Insufficient documentation

## 2014-02-17 DIAGNOSIS — J441 Chronic obstructive pulmonary disease with (acute) exacerbation: Secondary | ICD-10-CM

## 2014-02-17 DIAGNOSIS — F17200 Nicotine dependence, unspecified, uncomplicated: Secondary | ICD-10-CM | POA: Diagnosis not present

## 2014-02-17 DIAGNOSIS — F3289 Other specified depressive episodes: Secondary | ICD-10-CM | POA: Diagnosis not present

## 2014-02-17 DIAGNOSIS — F329 Major depressive disorder, single episode, unspecified: Secondary | ICD-10-CM | POA: Diagnosis not present

## 2014-02-17 DIAGNOSIS — K219 Gastro-esophageal reflux disease without esophagitis: Secondary | ICD-10-CM | POA: Diagnosis not present

## 2014-02-17 DIAGNOSIS — R0602 Shortness of breath: Secondary | ICD-10-CM | POA: Diagnosis present

## 2014-02-17 DIAGNOSIS — E039 Hypothyroidism, unspecified: Secondary | ICD-10-CM | POA: Insufficient documentation

## 2014-02-17 DIAGNOSIS — Z923 Personal history of irradiation: Secondary | ICD-10-CM | POA: Diagnosis not present

## 2014-02-17 DIAGNOSIS — Z8619 Personal history of other infectious and parasitic diseases: Secondary | ICD-10-CM | POA: Diagnosis not present

## 2014-02-17 LAB — BASIC METABOLIC PANEL
Anion gap: 11 (ref 5–15)
BUN: 12 mg/dL (ref 6–23)
CHLORIDE: 102 meq/L (ref 96–112)
CO2: 26 mEq/L (ref 19–32)
Calcium: 8.9 mg/dL (ref 8.4–10.5)
Creatinine, Ser: 0.7 mg/dL (ref 0.50–1.10)
GFR calc non Af Amer: 86 mL/min — ABNORMAL LOW (ref >90)
Glucose, Bld: 95 mg/dL (ref 70–99)
POTASSIUM: 3.8 meq/L (ref 3.7–5.3)
Sodium: 139 mEq/L (ref 137–147)

## 2014-02-17 LAB — CBC
HEMATOCRIT: 41.1 % (ref 36.0–46.0)
Hemoglobin: 14 g/dL (ref 12.0–15.0)
MCH: 32.3 pg (ref 26.0–34.0)
MCHC: 34.1 g/dL (ref 30.0–36.0)
MCV: 94.9 fL (ref 78.0–100.0)
Platelets: 256 10*3/uL (ref 150–400)
RBC: 4.33 MIL/uL (ref 3.87–5.11)
RDW: 13.6 % (ref 11.5–15.5)
WBC: 6.3 10*3/uL (ref 4.0–10.5)

## 2014-02-17 LAB — I-STAT TROPONIN, ED: TROPONIN I, POC: 0 ng/mL (ref 0.00–0.08)

## 2014-02-17 MED ORDER — IPRATROPIUM-ALBUTEROL 0.5-2.5 (3) MG/3ML IN SOLN
3.0000 mL | Freq: Once | RESPIRATORY_TRACT | Status: AC
  Administered 2014-02-17: 3 mL via RESPIRATORY_TRACT
  Filled 2014-02-17: qty 3

## 2014-02-17 MED ORDER — IPRATROPIUM BROMIDE 0.02 % IN SOLN
0.5000 mg | Freq: Once | RESPIRATORY_TRACT | Status: AC
  Administered 2014-02-17: 0.5 mg via RESPIRATORY_TRACT
  Filled 2014-02-17: qty 2.5

## 2014-02-17 MED ORDER — ALBUTEROL SULFATE (2.5 MG/3ML) 0.083% IN NEBU
5.0000 mg | INHALATION_SOLUTION | Freq: Once | RESPIRATORY_TRACT | Status: AC
  Administered 2014-02-17: 5 mg via RESPIRATORY_TRACT
  Filled 2014-02-17: qty 6

## 2014-02-17 MED ORDER — PREDNISONE 10 MG PO TABS: 20.0000 mg | ORAL_TABLET | Freq: Every day | ORAL | Status: DC

## 2014-02-17 MED ORDER — FENTANYL CITRATE 0.05 MG/ML IJ SOLN
50.0000 ug | Freq: Once | INTRAMUSCULAR | Status: AC
  Administered 2014-02-17: 50 ug via INTRAVENOUS
  Filled 2014-02-17: qty 2

## 2014-02-17 MED ORDER — ALBUTEROL SULFATE (2.5 MG/3ML) 0.083% IN NEBU: 5.0000 mg | INHALATION_SOLUTION | Freq: Once | RESPIRATORY_TRACT | Status: DC

## 2014-02-17 MED ORDER — METHYLPREDNISOLONE SODIUM SUCC 125 MG IJ SOLR
125.0000 mg | Freq: Once | INTRAMUSCULAR | Status: AC
  Administered 2014-02-17: 125 mg via INTRAVENOUS
  Filled 2014-02-17: qty 2

## 2014-02-17 NOTE — ED Provider Notes (Signed)
  Face-to-face evaluation   History: She complains of shortness of breath and inability to use her breathing medicine because of difficulty taking a breath. Symptoms ongoing and worsening. She has been smoking cigarettes. She does not use oxygen  Physical exam: Elderly, frail. Mild respiratory distress as evidenced by tachypnea, and increased work of breathing. She is able to live semi-recumbent without significant discomfort. Lungs with decreased air movement, and generalized expiratory wheezing. Exam is symmetric.  Assessment: COPD exacerbation, complicated by tobacco abuse.  Medical screening examination/treatment/procedure(s) were conducted as a shared visit with non-physician practitioner(s) and myself.  I personally evaluated the patient during the encounter  Richarda Blade, MD 02/18/14 [phone]

## 2014-02-17 NOTE — ED Notes (Signed)
Pt states that she has had chest pain with SOB since Friday. Pt states that it is worse with movement, breathing and palpation under left breast. Pt states that the pain is similar to when she had pleurisy. Pt states that pain has worsened since yesterday.

## 2014-02-17 NOTE — ED Notes (Signed)
Discharge instructions reviewed with pt. Pt verbalized understanding.   

## 2014-02-17 NOTE — Discharge Instructions (Signed)

## 2014-02-17 NOTE — ED Provider Notes (Signed)
Level V Caveat- respiratory distress.  Patient to the ER with complaints of chest pains and SOB. She has a PMH of hypertension, hypothyroid, GERD, COPD, low back pain, depression, complicated by smoking. She has lung cancer with hx of radiation and currently on chemotherapy Carboplatin and etoposide. She reports laying down not doing anything when she got a chest pain that started on the left side and wraps around of her back. She reports it gradually worsening till  This morning where she also developed severe SOB and worsening pain. She is also coughing but this worsens the pain. She denies having any other symptoms at this time.   CSN: [id number]     Arrival date & time 02/17/14  1432 History   First MD Initiated Contact with Patient 02/17/14 1613     (Consider location/radiation/quality/duration/timing/severity/associated sxs/prior Treatment) HPI Past Medical History  Diagnosis Date  . Hypertension   . Hypothyroid   . GERD (gastroesophageal reflux disease)   . COPD (chronic obstructive pulmonary disease)   . Low back pain   . Lung cancer 05/09/2011    RUL  . Depression   . History of radiation therapy 06/05/11 to 07/18/11    lung  . History of radiation therapy 09/13/11 to 09/26/11    brain  . Shingles   . Lung cancer, upper lobe 09/02/2012    rul  . History of chemotherapy     carboplatin and etoposide  . Hx of radiation therapy 10/03/12 - 10/09/12    RUL lung   Past Surgical History  Procedure Laterality Date  . Tubal ligation  1984  . Total abdominal hysterectomy  1986  . Tonsillectomy    . Endobronchial biopsy Right 05/03/2011   Family History  Problem Relation Age of Onset  . Esophageal cancer Father   . Hypothyroidism Father   . Cancer Father     esophageal  . Heart disease Mother   . Hyperlipidemia Mother   . Hypertension Mother     heart disease  . Other Brother     low back pain  . Cancer Brother     esophageal  . Other Brother     hypochondriasis  .  Other Brother     suicide  . Esophageal cancer Brother   . Cervical cancer Sister   . Cancer Sister     cervical   History  Substance Use Topics  . Smoking status: Current Every Day Smoker -- 1.00 packs/day for 50 years    Types: Cigarettes    Last Attempt to Quit: 04/27/2011  . Smokeless tobacco: Never Used     Comment: 2nd hand smoke exposure.  . Alcohol Use: No   OB History   Grav Para Term Preterm Abortions TAB SAB Ect Mult Living                 Review of Systems  Respiratory: Positive for cough and shortness of breath.   Cardiovascular: Positive for chest pain.   Level V Caveat- respiratory distress.   Allergies  Codeine  Home Medications   Prior to Admission medications   Medication Sig Start Date End Date Taking? Authorizing Provider  ADVAIR DISKUS 250-50 MCG/DOSE AEPB Inhale 1 puff into the lungs as needed (shortness of breath).  06/30/12  Yes Historical Provider, MD  albuterol (VENTOLIN HFA) 108 (90 BASE) MCG/ACT inhaler Inhale 2 puffs into the lungs every 6 (six) hours as needed for wheezing or shortness of breath.    Yes Historical Provider, MD  citalopram (  CELEXA) 10 MG tablet Take 10 mg by mouth daily.     Yes Historical Provider, MD  docusate sodium (COLACE) 100 MG capsule Take 100 mg by mouth 2 (two) times daily as needed for mild constipation.    Yes Historical Provider, MD  gabapentin (NEURONTIN) 600 MG tablet Take 600 mg by mouth at bedtime. Per BJ at Dr. Shon Baton office.   Yes Historical Provider, MD  ibuprofen (ADVIL,MOTRIN) 200 MG tablet Take 200 mg by mouth every 6 (six) hours as needed for pain.   Yes Historical Provider, MD  levothyroxine (SYNTHROID, LEVOTHROID) 112 MCG tablet Take 112 mcg by mouth daily.     Yes Historical Provider, MD  loratadine (CLARITIN) 10 MG tablet Take 10 mg by mouth daily as needed for allergies. prn   Yes Historical Provider, MD  montelukast (SINGULAIR) 10 MG tablet Take 10 mg by mouth daily. 08/25/12  Yes Historical  Provider, MD  Multiple Vitamin (MULTIVITAMIN) tablet Take 1 tablet by mouth daily.     Yes Historical Provider, MD  ranitidine (ZANTAC) 150 MG capsule Take 150 mg by mouth daily.  03/01/13  Yes Historical Provider, MD  tiotropium (SPIRIVA) 18 MCG inhalation capsule Place 18 mcg into inhaler and inhale daily.   Yes Historical Provider, MD  Vitamin D, Ergocalciferol, (DRISDOL) 50000 UNITS CAPS capsule Take 50,000 Units by mouth every 7 (seven) days. Monday   Yes Historical Provider, MD   BP 138/78  Pulse 101  Temp(Src) 97.8 F (36.6 C) (Oral)  Resp 24  Ht 5\' 4"  (1.626 m)  Wt 125 lb (56.7 kg)  BMI 21.45 kg/m2  SpO2 93% Physical Exam  Nursing note and vitals reviewed. Constitutional: She appears well-developed and well-nourished. She appears distressed (respiratory distress).  HENT:  Head: Normocephalic and atraumatic.  Eyes: Pupils are equal, round, and reactive to light.  Neck: Normal range of motion. Neck supple.  Cardiovascular: Normal rate and regular rhythm.   Pulmonary/Chest: Accessory muscle usage present. Tachypnea noted. She is in respiratory distress. She has decreased breath sounds. She has wheezes (inspiratory and expiratory wheezing).  Abdominal: Soft.  Neurological: She is alert.  Skin: Skin is warm and dry.    ED Course  Procedures (including critical care time) Labs Review Labs Reviewed  BASIC METABOLIC PANEL - Abnormal; Notable for the following:    GFR calc non Af Amer 86 (*)    All other components within normal limits  CBC  I-STAT TROPOININ, ED    Imaging Review Dg Chest 2 View (if Patient Has Fever And/or Copd)  02/17/2014   CLINICAL DATA:  Chest pain.  EXAM: CHEST  2 VIEW  COMPARISON:  CT 01/13/2014, 09/23/2013.  FINDINGS: Mediastinum and hilar structures are unremarkable. Stable right suprahilar and upper lobe interstitial prominence with retraction noted. These findings are again consistent with scarring from prior radiation. Lungs are clear of acute  infiltrates. No pleural effusion or pneumothorax. Stable heart size. Pulmonary vascularity is normal. No acute bony abnormality.  IMPRESSION: 1. Stable right upper lobe changes of scarring with retraction. These changes are most likely secondary with prior radiation treatment. 2. No acute abnormality.   Electronically Signed   By: Marcello Moores  Register   On: 02/17/2014 16:21     EKG Interpretation None      MDM   Final diagnoses:  COPD exacerbation   Patient given: Medications  ipratropium-albuterol (DUONEB) 0.5-2.5 (3) MG/3ML nebulizer solution 3 mL (3 mLs Nebulization Given 02/17/14 1456)  fentaNYL (SUBLIMAZE) injection 50 mcg (50 mcg Intravenous  Given 02/17/14 1706)  albuterol (PROVENTIL) (2.5 MG/3ML) 0.083% nebulizer solution 5 mg (5 mg Nebulization Given 02/17/14 1706)  ipratropium (ATROVENT) nebulizer solution 0.5 mg (0.5 mg Nebulization Given 02/17/14 1706)  methylPREDNISolone sodium succinate (SOLU-MEDROL) 125 mg/2 mL injection 125 mg (125 mg Intravenous Given 02/17/14 1719)   In the ED and is no longer having pain reports that she can now hold up the phlegm without any difficulty. She is still having increased effort of breathing but reports it is significantly better after the breathing treatments. Dr. Eulis Foster has seen the patient directly as well and has agreed with labs and images that have been ordered.  After the breathing treatment intervention the patient reports wanting to go home. She says "get me home, I would like to leave". Dr. Eulis Foster is okay with that so long as she follows up very closely in the next day or 2 with her primary care provider. He also recommends prednisone and that she no longer smoke. Patient is still mildly tachycardic at discharge, most likely due to albuterol treatments. She is still mildly tachypneic which she has some baseline due to her COPD and lung cancer. Her O2 saturations are at 95% on room air and her blood pressure is stable. I agree and feel she is safe  to discharge as long as she has close follow-up.   Filed Vitals:   02/17/14 1900  BP: 138/78  Pulse: 101  Temp:   Resp: [address]       Linus Mako, PA-C 02/17/14 1916

## 2014-06-13 ENCOUNTER — Other Ambulatory Visit: Payer: Self-pay | Admitting: Nurse Practitioner

## 2014-07-20 ENCOUNTER — Other Ambulatory Visit: Payer: Self-pay | Admitting: Medical Oncology

## 2014-07-20 ENCOUNTER — Other Ambulatory Visit: Payer: Commercial Managed Care - HMO

## 2014-07-20 ENCOUNTER — Ambulatory Visit (HOSPITAL_COMMUNITY): Admission: RE | Admit: 2014-07-20 | Payer: Commercial Managed Care - HMO | Source: Ambulatory Visit

## 2014-07-20 ENCOUNTER — Ambulatory Visit: Payer: Commercial Managed Care - HMO | Admitting: Internal Medicine

## 2014-07-20 ENCOUNTER — Telehealth: Payer: Self-pay | Admitting: Internal Medicine

## 2014-07-20 NOTE — Telephone Encounter (Signed)
S/w pt confirming labs moved to same day as CT chest per 01/25 POF..... KJ

## 2014-07-23 ENCOUNTER — Encounter (HOSPITAL_COMMUNITY): Payer: Self-pay

## 2014-07-23 ENCOUNTER — Ambulatory Visit (HOSPITAL_COMMUNITY)
Admission: RE | Admit: 2014-07-23 | Discharge: 2014-07-23 | Disposition: A | Discharge status: Home / Self Care | Payer: Commercial Managed Care - HMO | Source: Ambulatory Visit | Attending: Internal Medicine | Admitting: Internal Medicine

## 2014-07-23 ENCOUNTER — Other Ambulatory Visit (HOSPITAL_BASED_OUTPATIENT_CLINIC_OR_DEPARTMENT_OTHER): Payer: Commercial Managed Care - HMO

## 2014-07-23 DIAGNOSIS — Z85118 Personal history of other malignant neoplasm of bronchus and lung: Secondary | ICD-10-CM

## 2014-07-23 DIAGNOSIS — C3491 Malignant neoplasm of unspecified part of right bronchus or lung: Secondary | ICD-10-CM

## 2014-07-23 LAB — COMPREHENSIVE METABOLIC PANEL (CC13)
ALBUMIN: 4.1 g/dL (ref 3.5–5.0)
ALK PHOS: 71 U/L (ref 40–150)
ALT: 6 U/L (ref 0–55)
ANION GAP: 10 meq/L (ref 3–11)
AST: 14 U/L (ref 5–34)
BUN: 6.5 mg/dL — AB (ref 7.0–26.0)
CALCIUM: 9 mg/dL (ref 8.4–10.4)
CHLORIDE: 104 meq/L (ref 98–109)
CO2: 25 mEq/L (ref 22–29)
Creatinine: 0.9 mg/dL (ref 0.6–1.1)
EGFR: 70 mL/min/{1.73_m2} — AB (ref >90)
Glucose: 100 mg/dl (ref 70–140)
POTASSIUM: 3.8 meq/L (ref 3.5–5.1)
Sodium: 139 mEq/L (ref 136–145)
Total Bilirubin: 0.57 mg/dL (ref 0.20–1.20)
Total Protein: 7.4 g/dL (ref 6.4–8.3)

## 2014-07-23 LAB — CBC WITH DIFFERENTIAL/PLATELET
BASO%: 0.8 % (ref 0.0–2.0)
BASOS ABS: 0 10*3/uL (ref 0.0–0.1)
EOS%: 1.4 % (ref 0.0–7.0)
Eosinophils Absolute: 0.1 10*3/uL (ref 0.0–0.5)
HCT: 42.8 % (ref 34.8–46.6)
HEMOGLOBIN: 13.9 g/dL (ref 11.6–15.9)
LYMPH%: 14.1 % (ref 14.0–49.7)
MCH: 31.1 pg (ref 25.1–34.0)
MCHC: 32.4 g/dL (ref 31.5–36.0)
MCV: 96 fL (ref 79.5–101.0)
MONO#: 0.7 10*3/uL (ref 0.1–0.9)
MONO%: 10.8 % (ref 0.0–14.0)
NEUT#: 4.7 10*3/uL (ref 1.5–6.5)
NEUT%: 72.9 % (ref 38.4–76.8)
Platelets: 289 10*3/uL (ref 145–400)
RBC: 4.46 10*6/uL (ref 3.70–5.45)
RDW: 14.2 % (ref 11.2–14.5)
WBC: 6.5 10*3/uL (ref 3.9–10.3)
lymph#: 0.9 10*3/uL (ref 0.9–3.3)

## 2014-07-23 MED ORDER — IOHEXOL 300 MG/ML  SOLN
80.0000 mL | Freq: Once | INTRAMUSCULAR | Status: AC | PRN
  Administered 2014-07-23: 80 mL via INTRAVENOUS

## 2014-07-27 ENCOUNTER — Telehealth: Payer: Self-pay | Admitting: Internal Medicine

## 2014-07-27 ENCOUNTER — Encounter: Payer: Self-pay | Admitting: Internal Medicine

## 2014-07-27 ENCOUNTER — Encounter: Payer: Self-pay | Admitting: *Deleted

## 2014-07-27 ENCOUNTER — Ambulatory Visit (HOSPITAL_BASED_OUTPATIENT_CLINIC_OR_DEPARTMENT_OTHER): Payer: Commercial Managed Care - HMO | Admitting: Internal Medicine

## 2014-07-27 VITALS — BP 128/83 | HR 67 | Temp 98.1°F | Resp 18 | Ht 64.0 in | Wt 124.4 lb

## 2014-07-27 DIAGNOSIS — Z720 Tobacco use: Secondary | ICD-10-CM

## 2014-07-27 DIAGNOSIS — Z85118 Personal history of other malignant neoplasm of bronchus and lung: Secondary | ICD-10-CM

## 2014-07-27 DIAGNOSIS — C3491 Malignant neoplasm of unspecified part of right bronchus or lung: Secondary | ICD-10-CM

## 2014-07-27 NOTE — Telephone Encounter (Signed)
gv and printed appt sched and avs for pt for Aug °

## 2014-07-27 NOTE — Patient Instructions (Signed)
Smoking Cessation Quitting smoking is important to your health and has many advantages. However, it is not always easy to quit since nicotine is a very addictive drug. Oftentimes, people try 3 times or more before being able to quit. This document explains the best ways for you to prepare to quit smoking. Quitting takes hard work and a lot of effort, but you can do it. ADVANTAGES OF QUITTING SMOKING  You will live longer, feel better, and live better.  Your body will feel the impact of quitting smoking almost immediately.  Within 20 minutes, blood pressure decreases. Your pulse returns to its normal level.  After 8 hours, carbon monoxide levels in the blood return to normal. Your oxygen level increases.  After 24 hours, the chance of having a heart attack starts to decrease. Your breath, hair, and body stop smelling like smoke.  After 48 hours, damaged nerve endings begin to recover. Your sense of taste and smell improve.  After 72 hours, the body is virtually free of nicotine. Your bronchial tubes relax and breathing becomes easier.  After 2 to 12 weeks, lungs can hold more air. Exercise becomes easier and circulation improves.  The risk of having a heart attack, stroke, cancer, or lung disease is greatly reduced.  After 1 year, the risk of coronary heart disease is cut in half.  After 5 years, the risk of stroke falls to the same as a nonsmoker.  After 10 years, the risk of lung cancer is cut in half and the risk of other cancers decreases significantly.  After 15 years, the risk of coronary heart disease drops, usually to the level of a nonsmoker.  If you are pregnant, quitting smoking will improve your chances of having a healthy baby.  The people you live with, especially any children, will be healthier.  You will have extra money to spend on things other than cigarettes. QUESTIONS TO THINK ABOUT BEFORE ATTEMPTING TO QUIT You may want to talk about your answers with your  health care provider.  Why do you want to quit?  If you tried to quit in the past, what helped and what did not?  What will be the most difficult situations for you after you quit? How will you plan to handle them?  Who can help you through the tough times? Your family? Friends? A health care provider?  What pleasures do you get from smoking? What ways can you still get pleasure if you quit? Here are some questions to ask your health care provider:  How can you help me to be successful at quitting?  What medicine do you think would be best for me and how should I take it?  What should I do if I need more help?  What is smoking withdrawal like? How can I get information on withdrawal? GET READY  Set a quit date.  Change your environment by getting rid of all cigarettes, ashtrays, matches, and lighters in your home, car, or work. Do not let people smoke in your home.  Review your past attempts to quit. Think about what worked and what did not. GET SUPPORT AND ENCOURAGEMENT You have a better chance of being successful if you have help. You can get support in many ways.  Tell your family, friends, and coworkers that you are going to quit and need their support. Ask them not to smoke around you.  Get individual, group, or telephone counseling and support. Programs are available at local hospitals and health centers. Call   your local health department for information about programs in your area.  Spiritual beliefs and practices may help some smokers quit.  Download a "quit meter" on your computer to keep track of quit statistics, such as how long you have gone without smoking, cigarettes not smoked, and money saved.  Get a self-help book about quitting smoking and staying off tobacco. LEARN NEW SKILLS AND BEHAVIORS  Distract yourself from urges to smoke. Talk to someone, go for a walk, or occupy your time with a task.  Change your normal routine. Take a different route to work.  Drink tea instead of coffee. Eat breakfast in a different place.  Reduce your stress. Take a hot bath, exercise, or read a book.  Plan something enjoyable to do every day. Reward yourself for not smoking.  Explore interactive web-based programs that specialize in helping you quit. GET MEDICINE AND USE IT CORRECTLY Medicines can help you stop smoking and decrease the urge to smoke. Combining medicine with the above behavioral methods and support can greatly increase your chances of successfully quitting smoking.  Nicotine replacement therapy helps deliver nicotine to your body without the negative effects and risks of smoking. Nicotine replacement therapy includes nicotine gum, lozenges, inhalers, nasal sprays, and skin patches. Some may be available over-the-counter and others require a prescription.  Antidepressant medicine helps people abstain from smoking, but how this works is unknown. This medicine is available by prescription.  Nicotinic receptor partial agonist medicine simulates the effect of nicotine in your brain. This medicine is available by prescription. Ask your health care provider for advice about which medicines to use and how to use them based on your health history. Your health care provider will tell you what side effects to look out for if you choose to be on a medicine or therapy. Carefully read the information on the package. Do not use any other product containing nicotine while using a nicotine replacement product.  RELAPSE OR DIFFICULT SITUATIONS Most relapses occur within the first 3 months after quitting. Do not be discouraged if you start smoking again. Remember, most people try several times before finally quitting. You may have symptoms of withdrawal because your body is used to nicotine. You may crave cigarettes, be irritable, feel very hungry, cough often, get headaches, or have difficulty concentrating. The withdrawal symptoms are only temporary. They are strongest  when you first quit, but they will go away within 10-14 days. To reduce the chances of relapse, try to:  Avoid drinking alcohol. Drinking lowers your chances of successfully quitting.  Reduce the amount of caffeine you consume. Once you quit smoking, the amount of caffeine in your body increases and can give you symptoms, such as a rapid heartbeat, sweating, and anxiety.  Avoid smokers because they can make you want to smoke.  Do not let weight gain distract you. Many smokers will gain weight when they quit, usually less than 10 pounds. Eat a healthy diet and stay active. You can always lose the weight gained after you quit.  Find ways to improve your mood other than smoking. FOR MORE INFORMATION  www.smokefree.gov  Document Released: 06/06/2001 Document Revised: 10/27/2013 Document Reviewed: 09/21/2011 [employer] Patient Information 2015 [employer]. This information is not intended to replace advice given to you by your health care provider. Make sure you discuss any questions you have with your health care provider.  

## 2014-07-27 NOTE — [facility] Oncology Navigator Note (Unsigned)
Spoke with Janice Daniels today at [facility]. She is on observation with follow up scan in 3 months.  Unfortunately, she is smoking again.  I spoke with her and gave her smoking cessation education.  She listened and has a quit date in 2 weeks.  I gave her tips to help.  I asked her to call me if she needed further assistance.  She stated she would.  I also gave her a booklet on smoking cessation from the [employer].  She also stated that her PCP gave her chantix to help quit.  I instructed her on how to take.  She stated she was going to try to take and focus on smoking cessation.

## 2014-07-27 NOTE — Progress Notes (Signed)
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   PRIOR THERAPY:  1) Status post palliative radiotherapy to the right hilum under the care Dr. Valere Dross.  2) Systemic chemotherapy with carboplatin for AUC of 5 on day 1 and etoposide 120 mg/M2 on days 1,2 and 3 with Neulasta support on day 4, status post 6 cycles.  3) Prophylactic cranial irradiation under the care of Dr. Valere Dross.  4) Curative stereotactic radiotherapy to right lung nodule suspicious for a stage IA non-small cell lung cancer under the care of Dr. Valere Dross completed on 10/09/2012.   CURRENT THERAPY: Observation.  INTERVAL HISTORY: Janice Daniels 69 y.o. female returns to the clinic today for six-month followup visit. The patient is feeling fine today with no specific complaints. She denied having any significant weight loss or night sweats. She has no chest pain, shortness of breath with exertion, cough or hemoptysis. She has no nausea or vomiting, no fever or chills. Unfortunately she started smoking again. She had repeat CT scan of the chest performed recently and she is here for evaluation and discussion of her scan results.  MEDICAL HISTORY: Past Medical History  Diagnosis Date  . Hypertension   . Hypothyroid   . GERD (gastroesophageal reflux disease)   . COPD (chronic obstructive pulmonary disease)   . Low back pain   . Lung cancer 05/09/2011    RUL  . Depression   . History of radiation therapy 06/05/11 to 07/18/11    lung  . History of radiation therapy 09/13/11 to 09/26/11    brain  . Shingles   . Lung cancer, upper lobe 09/02/2012    rul  . History of chemotherapy     carboplatin and etoposide  . Hx of radiation therapy 10/03/12 - 10/09/12    RUL lung    ALLERGIES:  is allergic to codeine.  MEDICATIONS:    Current Outpatient Prescriptions  Medication Sig Dispense Refill  . ADVAIR DISKUS 250-50 MCG/DOSE AEPB Inhale 1 puff into the lungs as needed (shortness of breath).     Marland Kitchen albuterol (PROVENTIL) (2.5 MG/3ML) 0.083% nebulizer solution   4  . albuterol (VENTOLIN HFA) 108 (90 BASE) MCG/ACT inhaler Inhale 2 puffs into the lungs every 6 (six) hours as needed for wheezing or shortness of breath.     . citalopram (CELEXA) 10 MG tablet Take 10 mg by mouth daily.      Marland Kitchen docusate sodium (COLACE) 100 MG capsule Take 100 mg by mouth 2 (two) times daily as needed for mild constipation.     . gabapentin (NEURONTIN) 600 MG tablet Take 600 mg by mouth at bedtime. Per BJ at Dr. Shon Baton office.    Marland Kitchen ibuprofen (ADVIL,MOTRIN) 200 MG tablet Take 200 mg by mouth every 6 (six) hours as needed for pain.    Marland Kitchen levothyroxine (SYNTHROID, LEVOTHROID) 112 MCG tablet Take 112 mcg by mouth daily.      . montelukast (SINGULAIR) 10 MG tablet Take 10 mg by mouth daily.    . Multiple Vitamin (MULTIVITAMIN) tablet Take 1 tablet by mouth daily.      . ranitidine (ZANTAC) 150 MG capsule Take 150 mg by mouth daily.     Marland Kitchen tiotropium (SPIRIVA) 18 MCG inhalation capsule Place 18 mcg into inhaler and inhale daily.    . Vitamin  D, Ergocalciferol, (DRISDOL) 50000 UNITS CAPS capsule Take 50,000 Units by mouth every 7 (seven) days. Monday    . loratadine (CLARITIN) 10 MG tablet Take 10 mg by mouth daily as needed for allergies. prn     No current facility-administered medications for this visit.    SURGICAL HISTORY:  Past Surgical History  Procedure Laterality Date  . Tubal ligation  1984  . Total abdominal hysterectomy  1986  . Tonsillectomy    . Endobronchial biopsy Right 05/03/2011    REVIEW OF SYSTEMS:  A comprehensive review of systems was negative.   PHYSICAL EXAMINATION: General appearance: alert, cooperative and no distress Head: Normocephalic, without obvious abnormality, atraumatic Neck: no adenopathy, no JVD, supple,  symmetrical, trachea midline and thyroid not enlarged, symmetric, no tenderness/mass/nodules Lymph nodes: Cervical, supraclavicular, and axillary nodes normal. Resp: clear to auscultation bilaterally Back: symmetric, no curvature. ROM normal. No CVA tenderness. Cardio: regular rate and rhythm, S1, S2 normal, no murmur, click, rub or gallop GI: soft, non-tender; bowel sounds normal; no masses,  no organomegaly Extremities: extremities normal, atraumatic, no cyanosis or edema  ECOG PERFORMANCE STATUS: 1 - Symptomatic but completely ambulatory  Blood pressure 128/83, pulse 67, temperature 98.1 F (36.7 C), temperature source Oral, resp. rate 18, height 5\' 4"  (1.626 m), weight 124 lb 6.4 oz (56.427 kg), SpO2 97 %.  LABORATORY DATA: Lab Results  Component Value Date   WBC 6.5 07/23/2014   HGB 13.9 07/23/2014   HCT 42.8 07/23/2014   MCV 96.0 07/23/2014   PLT 289 07/23/2014      Chemistry      Component Value Date/Time   NA 139 07/23/2014 1354   NA 139 02/17/2014 1453   NA 143 11/17/2011 0847   K 3.8 07/23/2014 1354   K 3.8 02/17/2014 1453   K 4.6 11/17/2011 0847   CL 102 02/17/2014 1453   CL 105 12/17/2012 1006   CL 101 11/17/2011 0847   CO2 25 07/23/2014 1354   CO2 26 02/17/2014 1453   CO2 30 11/17/2011 0847   BUN 6.5* 07/23/2014 1354   BUN 12 02/17/2014 1453   BUN 13 11/17/2011 0847   CREATININE 0.9 07/23/2014 1354   CREATININE 0.70 02/17/2014 1453   CREATININE 0.8 11/17/2011 0847      Component Value Date/Time   CALCIUM 9.0 07/23/2014 1354   CALCIUM 8.9 02/17/2014 1453   CALCIUM 9.0 11/17/2011 0847   ALKPHOS 71 07/23/2014 1354   ALKPHOS 60 11/17/2011 0847   ALKPHOS 65 09/19/2011 1027   AST 14 07/23/2014 1354   AST 23 11/17/2011 0847   AST 12 09/19/2011 1027   ALT 6 07/23/2014 1354   ALT 17 11/17/2011 0847   ALT <8 09/19/2011 1027   BILITOT 0.57 07/23/2014 1354   BILITOT 0.60 11/17/2011 0847   BILITOT 0.3 09/19/2011 1027       RADIOGRAPHIC STUDIES: Ct  Chest W Contrast  07/23/2014   CLINICAL DATA:  Lung carcinoma. Consider subsequent. Initial diagnosis 2012.  EXAM: CT CHEST WITH CONTRAST  TECHNIQUE: Multidetector CT imaging of the chest was performed during intravenous contrast administration.  CONTRAST:  46mL OMNIPAQUE IOHEXOL 300 MG/ML  SOLN  COMPARISON:  CT 01/13/2014, PET-CT 10/06/2013  FINDINGS: Mediastinum/Nodes: No axillary or supraclavicular lymphadenopathy. No mediastinal hilar lymphadenopathy. There is ill-defined thickening along the bronchus intermedius not changed from prior. No measurable nodes. Central pulmonary embolism. No pericardial fluid.  Lungs/Pleura: There is peribronchial hilar consolidation in the right upper lobe and right lower lobe not changed from  prior. There is underlying bronchiectasis and peripheral fibrotic linear change. No measurable nodularity.  There is fine nodular branching pattern within the medial lingula which is new compared to prior (image 28 and 3)0. Left lower lobe is clear.  Upper abdomen: Limited view of the liver, kidneys, pancreas are unremarkable. Normal adrenal glands.  Musculoskeletal: No aggressive osseous lesion inferior mid  IMPRESSION: 1. Stable perihilar consolidation and peribronchial thickening in the right lung consistent with radiation change. Note evidence of local recurrence. 2. New branching nodular pattern in the medial lingula is most suggestive of an infectious process. Consider chronic infectious process such mycobacteria avium with this pattern.   Electronically Signed   By: Suzy Bouchard M.D.   On: 07/23/2014 16:53    ASSESSMENT AND PLAN: This is a very pleasant 69 years old white female with history of limited stage small cell lung cancer diagnosed in October of 2012 status post systemic chemotherapy as well as palliative radiotherapy to the right hilum with prophylactic cranial irradiation and curative radiotherapy to right lung nodule. Her recent CT scan of the chest showed no  evidence for disease progression but it showed new branching nodular pattern. The patient has no symptoms concerning for infection. I discussed the scan results with the patient and recommended for her to continue on observation with repeat CT scan of the chest in 6 months. For smoke cessation, I strongly encouraged the patient to quit smoking and offered her to smoke cessation program. The patient was also seen by the thoracic navigator for smoke cessation counseling. She was advised to call immediately if she has any concerning symptoms in the interval.  The patient voices understanding of current disease status and treatment options and is in agreement with the current care plan.  All questions were answered. The patient knows to call the clinic with any problems, questions or concerns. We can certainly see the patient much sooner if necessary.  Disclaimer: This note was dictated with voice recognition software. Similar sounding words can inadvertently be transcribed and may not be corrected upon review.

## 2014-10-25 ENCOUNTER — Emergency Department (HOSPITAL_COMMUNITY): Payer: Commercial Managed Care - HMO

## 2014-10-25 ENCOUNTER — Inpatient Hospital Stay (HOSPITAL_COMMUNITY)
Admission: EM | Admit: 2014-10-25 | Discharge: 2014-10-30 | DRG: 291 | Disposition: A | Discharge status: Home / Self Care | Payer: Commercial Managed Care - HMO | Attending: Internal Medicine | Admitting: Internal Medicine

## 2014-10-25 ENCOUNTER — Encounter (HOSPITAL_COMMUNITY): Payer: Self-pay | Admitting: *Deleted

## 2014-10-25 DIAGNOSIS — E038 Other specified hypothyroidism: Secondary | ICD-10-CM | POA: Diagnosis present

## 2014-10-25 DIAGNOSIS — E785 Hyperlipidemia, unspecified: Secondary | ICD-10-CM | POA: Diagnosis present

## 2014-10-25 DIAGNOSIS — Z9221 Personal history of antineoplastic chemotherapy: Secondary | ICD-10-CM | POA: Diagnosis not present

## 2014-10-25 DIAGNOSIS — Z886 Allergy status to analgesic agent status: Secondary | ICD-10-CM

## 2014-10-25 DIAGNOSIS — Z90710 Acquired absence of both cervix and uterus: Secondary | ICD-10-CM | POA: Diagnosis not present

## 2014-10-25 DIAGNOSIS — E039 Hypothyroidism, unspecified: Secondary | ICD-10-CM | POA: Diagnosis present

## 2014-10-25 DIAGNOSIS — J9601 Acute respiratory failure with hypoxia: Secondary | ICD-10-CM | POA: Diagnosis present

## 2014-10-25 DIAGNOSIS — C3490 Malignant neoplasm of unspecified part of unspecified bronchus or lung: Secondary | ICD-10-CM | POA: Diagnosis present

## 2014-10-25 DIAGNOSIS — Z720 Tobacco use: Secondary | ICD-10-CM | POA: Diagnosis present

## 2014-10-25 DIAGNOSIS — E860 Dehydration: Secondary | ICD-10-CM | POA: Diagnosis present

## 2014-10-25 DIAGNOSIS — J189 Pneumonia, unspecified organism: Secondary | ICD-10-CM | POA: Diagnosis present

## 2014-10-25 DIAGNOSIS — R0600 Dyspnea, unspecified: Secondary | ICD-10-CM | POA: Diagnosis not present

## 2014-10-25 DIAGNOSIS — K219 Gastro-esophageal reflux disease without esophagitis: Secondary | ICD-10-CM | POA: Diagnosis present

## 2014-10-25 DIAGNOSIS — Z7951 Long term (current) use of inhaled steroids: Secondary | ICD-10-CM

## 2014-10-25 DIAGNOSIS — C3411 Malignant neoplasm of upper lobe, right bronchus or lung: Secondary | ICD-10-CM | POA: Diagnosis not present

## 2014-10-25 DIAGNOSIS — F17210 Nicotine dependence, cigarettes, uncomplicated: Secondary | ICD-10-CM | POA: Diagnosis present

## 2014-10-25 DIAGNOSIS — Y95 Nosocomial condition: Secondary | ICD-10-CM | POA: Diagnosis present

## 2014-10-25 DIAGNOSIS — I10 Essential (primary) hypertension: Secondary | ICD-10-CM | POA: Diagnosis present

## 2014-10-25 DIAGNOSIS — F329 Major depressive disorder, single episode, unspecified: Secondary | ICD-10-CM | POA: Diagnosis present

## 2014-10-25 DIAGNOSIS — R0902 Hypoxemia: Secondary | ICD-10-CM

## 2014-10-25 DIAGNOSIS — J9621 Acute and chronic respiratory failure with hypoxia: Secondary | ICD-10-CM | POA: Diagnosis present

## 2014-10-25 DIAGNOSIS — Z923 Personal history of irradiation: Secondary | ICD-10-CM | POA: Diagnosis not present

## 2014-10-25 DIAGNOSIS — Z9851 Tubal ligation status: Secondary | ICD-10-CM | POA: Diagnosis not present

## 2014-10-25 DIAGNOSIS — J441 Chronic obstructive pulmonary disease with (acute) exacerbation: Secondary | ICD-10-CM | POA: Diagnosis not present

## 2014-10-25 DIAGNOSIS — Z85118 Personal history of other malignant neoplasm of bronchus and lung: Secondary | ICD-10-CM

## 2014-10-25 DIAGNOSIS — I5023 Acute on chronic systolic (congestive) heart failure: Secondary | ICD-10-CM | POA: Diagnosis present

## 2014-10-25 DIAGNOSIS — F22 Delusional disorders: Secondary | ICD-10-CM

## 2014-10-25 DIAGNOSIS — I429 Cardiomyopathy, unspecified: Secondary | ICD-10-CM | POA: Diagnosis present

## 2014-10-25 DIAGNOSIS — N179 Acute kidney failure, unspecified: Secondary | ICD-10-CM | POA: Diagnosis present

## 2014-10-25 DIAGNOSIS — C3491 Malignant neoplasm of unspecified part of right bronchus or lung: Secondary | ICD-10-CM

## 2014-10-25 DIAGNOSIS — R0602 Shortness of breath: Secondary | ICD-10-CM | POA: Diagnosis present

## 2014-10-25 LAB — I-STAT ARTERIAL BLOOD GAS, ED
Acid-base deficit: 1 mmol/L (ref 0.0–2.0)
BICARBONATE: 24.8 meq/L — AB (ref 20.0–24.0)
O2 SAT: 100 %
PO2 ART: 171 mmHg — AB (ref 80.0–100.0)
Patient temperature: 97.6
TCO2: 26 mmol/L (ref 0–100)
pCO2 arterial: 41.2 mmHg (ref 35.0–45.0)
pH, Arterial: 7.385 (ref 7.350–7.450)

## 2014-10-25 LAB — CBC WITH DIFFERENTIAL/PLATELET
Basophils Absolute: 0 10*3/uL (ref 0.0–0.1)
Basophils Relative: 1 % (ref 0–1)
EOS ABS: 0.2 10*3/uL (ref 0.0–0.7)
Eosinophils Relative: 2 % (ref 0–5)
HCT: 36.7 % (ref 36.0–46.0)
Hemoglobin: 12.1 g/dL (ref 12.0–15.0)
Lymphocytes Relative: 11 % — ABNORMAL LOW (ref 12–46)
Lymphs Abs: 0.9 10*3/uL (ref 0.7–4.0)
MCH: 30.6 pg (ref 26.0–34.0)
MCHC: 33 g/dL (ref 30.0–36.0)
MCV: 92.9 fL (ref 78.0–100.0)
MONOS PCT: 6 % (ref 3–12)
Monocytes Absolute: 0.5 10*3/uL (ref 0.1–1.0)
NEUTROS ABS: 7 10*3/uL (ref 1.7–7.7)
NEUTROS PCT: 80 % — AB (ref 43–77)
Platelets: 318 10*3/uL (ref 150–400)
RBC: 3.95 MIL/uL (ref 3.87–5.11)
RDW: 13.6 % (ref 11.5–15.5)
WBC: 8.6 10*3/uL (ref 4.0–10.5)

## 2014-10-25 LAB — BASIC METABOLIC PANEL
ANION GAP: 9 (ref 5–15)
BUN: 10 mg/dL (ref 6–20)
CALCIUM: 8.8 mg/dL — AB (ref 8.9–10.3)
CO2: 25 mmol/L (ref 22–32)
Chloride: 103 mmol/L (ref 101–111)
Creatinine, Ser: 1.04 mg/dL — ABNORMAL HIGH (ref 0.44–1.00)
GFR, EST NON AFRICAN AMERICAN: 54 mL/min — AB (ref >60)
GLUCOSE: 126 mg/dL — AB (ref 70–99)
Potassium: 4.3 mmol/L (ref 3.5–5.1)
SODIUM: 137 mmol/L (ref 135–145)

## 2014-10-25 LAB — I-STAT TROPONIN, ED: Troponin i, poc: 0.01 ng/mL (ref 0.00–0.08)

## 2014-10-25 LAB — I-STAT CG4 LACTIC ACID, ED
LACTIC ACID, VENOUS: 0.81 mmol/L (ref 0.5–2.0)
Lactic Acid, Venous: 1.1 mmol/L (ref 0.5–2.0)

## 2014-10-25 LAB — CBG MONITORING, ED: GLUCOSE-CAPILLARY: 137 mg/dL — AB (ref 70–99)

## 2014-10-25 LAB — BRAIN NATRIURETIC PEPTIDE: B NATRIURETIC PEPTIDE 5: 1083.8 pg/mL — AB (ref 0.0–100.0)

## 2014-10-25 MED ORDER — IOHEXOL 350 MG/ML SOLN
75.0000 mL | Freq: Once | INTRAVENOUS | Status: AC | PRN
  Administered 2014-10-25: 75 mL via INTRAVENOUS

## 2014-10-25 MED ORDER — METHYLPREDNISOLONE SODIUM SUCC 125 MG IJ SOLR
125.0000 mg | Freq: Once | INTRAMUSCULAR | Status: AC
  Administered 2014-10-25: 125 mg via INTRAVENOUS
  Filled 2014-10-25: qty 2

## 2014-10-25 MED ORDER — ALBUTEROL SULFATE (2.5 MG/3ML) 0.083% IN NEBU
2.5000 mg | INHALATION_SOLUTION | RESPIRATORY_TRACT | Status: DC
  Administered 2014-10-26 (×2): 2.5 mg via RESPIRATORY_TRACT
  Filled 2014-10-25 (×2): qty 3

## 2014-10-25 MED ORDER — IPRATROPIUM BROMIDE 0.02 % IN SOLN
0.5000 mg | RESPIRATORY_TRACT | Status: DC
  Administered 2014-10-26 (×2): 0.5 mg via RESPIRATORY_TRACT
  Filled 2014-10-25 (×2): qty 2.5

## 2014-10-25 NOTE — ED Notes (Signed)
Pt reports increased SOB since Friday. Denies fevers. Pt reports "sweating a lot". Pt arrived on CPAP from EMS. Pt received 10albuterol and 1 atrovent.

## 2014-10-25 NOTE — ED Notes (Signed)
Pt reported that she failed to inform the MD that she experienced "coughing up blood for 3 days" PTA; RN will notify MD

## 2014-10-25 NOTE — ED Notes (Signed)
Report attempted, RN to call back. 

## 2014-10-25 NOTE — ED Notes (Signed)
Pt called out reporting left chest "spasm" dr Venora Maples aware. Repeat ekg shown to MD.

## 2014-10-25 NOTE — ED Notes (Signed)
MD at bedside. 

## 2014-10-25 NOTE — ED Provider Notes (Signed)
CSN: [id number]     Arrival date & time 10/25/14  1706 History   First MD Initiated Contact with Patient 10/25/14 1714     Chief Complaint  Patient presents with  . Respiratory Distress   (Consider location/radiation/quality/duration/timing/severity/associated sxs/prior Treatment) Patient is a 69 y.o. female presenting with shortness of breath. The history is provided by the patient and the EMS personnel. No language interpreter was used.  Shortness of Breath Severity:  Severe Onset quality:  Gradual Timing:  Constant Progression:  Worsening Chronicity:  Recurrent Context: URI   Relieved by:  Nothing Worsened by:  Activity and coughing Ineffective treatments:  Inhaler, oxygen, sitting up and rest Associated symptoms: cough, sputum production and wheezing   Associated symptoms: no abdominal pain, no chest pain, no diaphoresis, no fever, no headaches, no rash and no vomiting   Risk factors: tobacco use   Risk factors: no family hx of DVT, no hx of cancer, no hx of PE/DVT, no obesity and no prolonged immobilization     Past Medical History  Diagnosis Date  . Hypertension   . Hypothyroid   . GERD (gastroesophageal reflux disease)   . COPD (chronic obstructive pulmonary disease)   . Low back pain   . Lung cancer 05/09/2011    RUL  . Depression   . History of radiation therapy 06/05/11 to 07/18/11    lung  . History of radiation therapy 09/13/11 to 09/26/11    brain  . Shingles   . Lung cancer, upper lobe 09/02/2012    rul  . History of chemotherapy     carboplatin and etoposide  . Hx of radiation therapy 10/03/12 - 10/09/12    RUL lung   Past Surgical History  Procedure Laterality Date  . Tubal ligation  1984  . Total abdominal hysterectomy  1986  . Tonsillectomy    . Endobronchial biopsy Right 05/03/2011   Family History  Problem Relation Age of Onset  . Esophageal cancer Father   . Hypothyroidism Father   . Cancer Father     esophageal  . Heart disease Mother   .  Hyperlipidemia Mother   . Hypertension Mother     heart disease  . Other Brother     low back pain  . Cancer Brother     esophageal  . Other Brother     hypochondriasis  . Other Brother     suicide  . Esophageal cancer Brother   . Cervical cancer Sister   . Cancer Sister     cervical   History  Substance Use Topics  . Smoking status: Current Every Day Smoker -- 1.00 packs/day for 50 years    Types: Cigarettes    Last Attempt to Quit: 04/27/2011  . Smokeless tobacco: Never Used     Comment: 2nd hand smoke exposure.  . Alcohol Use: No   OB History    No data available     Review of Systems  Constitutional: Positive for fatigue. Negative for fever and diaphoresis.  Respiratory: Positive for cough, sputum production, chest tightness, shortness of breath and wheezing.   Cardiovascular: Negative for chest pain and palpitations.  Gastrointestinal: Negative for nausea, vomiting, abdominal pain and constipation.  Skin: Negative for rash.  Neurological: Negative for weakness, light-headedness and headaches.  Psychiatric/Behavioral: Negative for confusion.  All other systems reviewed and are negative.     Allergies  Codeine  Home Medications   Prior to Admission medications   Medication Sig Start Date End Date Taking? Authorizing  Provider  ADVAIR DISKUS 250-50 MCG/DOSE AEPB Inhale 1 puff into the lungs as needed (shortness of breath).  06/30/12   Historical Provider, MD  albuterol (PROVENTIL) (2.5 MG/3ML) 0.083% nebulizer solution  07/07/14   Historical Provider, MD  albuterol (VENTOLIN HFA) 108 (90 BASE) MCG/ACT inhaler Inhale 2 puffs into the lungs every 6 (six) hours as needed for wheezing or shortness of breath.     Historical Provider, MD  citalopram (CELEXA) 10 MG tablet Take 10 mg by mouth daily.      Historical Provider, MD  docusate sodium (COLACE) 100 MG capsule Take 100 mg by mouth 2 (two) times daily as needed for mild constipation.     Historical Provider, MD   gabapentin (NEURONTIN) 600 MG tablet Take 600 mg by mouth at bedtime. Per BJ at Dr. Shon Baton office.    Historical Provider, MD  ibuprofen (ADVIL,MOTRIN) 200 MG tablet Take 200 mg by mouth every 6 (six) hours as needed for pain.    Historical Provider, MD  levothyroxine (SYNTHROID, LEVOTHROID) 112 MCG tablet Take 112 mcg by mouth daily.      Historical Provider, MD  loratadine (CLARITIN) 10 MG tablet Take 10 mg by mouth daily as needed for allergies. prn    Historical Provider, MD  montelukast (SINGULAIR) 10 MG tablet Take 10 mg by mouth daily. 08/25/12   Historical Provider, MD  Multiple Vitamin (MULTIVITAMIN) tablet Take 1 tablet by mouth daily.      Historical Provider, MD  ranitidine (ZANTAC) 150 MG capsule Take 150 mg by mouth daily.  03/01/13   Historical Provider, MD  tiotropium (SPIRIVA) 18 MCG inhalation capsule Place 18 mcg into inhaler and inhale daily.    Historical Provider, MD  Vitamin D, Ergocalciferol, (DRISDOL) 50000 UNITS CAPS capsule Take 50,000 Units by mouth every 7 (seven) days. Monday    Historical Provider, MD   BP 139/99 mmHg  Pulse 114  Temp(Src) 97.6 F (36.4 C) (Temporal)  Resp 21  SpO2 100% Physical Exam  Constitutional: She appears well-developed and well-nourished. She is active and cooperative. She has a sickly appearance. No distress. Face mask in place.  HENT:  Head: Normocephalic and atraumatic.  Nose: Nose normal.  Mouth/Throat: Oropharynx is clear and moist. No oropharyngeal exudate.  Eyes: EOM are normal. Pupils are equal, round, and reactive to light.  Neck: Normal range of motion. Neck supple.  Cardiovascular: Normal rate, regular rhythm, normal heart sounds and intact distal pulses.   No murmur heard. Pulmonary/Chest: Accessory muscle usage present. Tachypnea noted. She is in respiratory distress. She has no wheezes. She has rhonchi. She exhibits no tenderness.  Speaking in somewhat short sentences, increased work of breathing but no retractions and  no tripoding.  Diffuse rhonchi.    Abdominal: Soft. There is no tenderness. There is no rebound and no guarding.  Musculoskeletal: Normal range of motion. She exhibits no tenderness.  Lymphadenopathy:    She has no cervical adenopathy.  Neurological: She is alert. No cranial nerve deficit. Coordination normal.  Skin: Skin is warm and dry. She is not diaphoretic.  Psychiatric: She has a normal mood and affect. Her behavior is normal. Judgment and thought content normal.  Nursing note and vitals reviewed.  ED Course  Procedures (including critical care time) Labs Review Labs Reviewed  CBC WITH DIFFERENTIAL/PLATELET - Abnormal; Notable for the following:    Neutrophils Relative % 80 (*)    Lymphocytes Relative 11 (*)    All other components within normal limits  BASIC METABOLIC  PANEL - Abnormal; Notable for the following:    Glucose, Bld 126 (*)    Creatinine, Ser 1.04 (*)    Calcium 8.8 (*)    GFR calc non Af Amer 54 (*)    All other components within normal limits  BRAIN NATRIURETIC PEPTIDE - Abnormal; Notable for the following:    B Natriuretic Peptide 1083.8 (*)    All other components within normal limits  CBG MONITORING, ED - Abnormal; Notable for the following:    Glucose-Capillary 137 (*)    All other components within normal limits  I-STAT ARTERIAL BLOOD GAS, ED - Abnormal; Notable for the following:    pO2, Arterial 171.0 (*)    Bicarbonate 24.8 (*)    All other components within normal limits  I-STAT TROPOININ, ED  I-STAT CG4 LACTIC ACID, ED  I-STAT CG4 LACTIC ACID, ED   Imaging Review Ct Angio Chest Pe W/cm &/or Wo Cm  10/25/2014   CLINICAL DATA:  Increase short of breath since Friday. History of right lung cancer.  EXAM: CT ANGIOGRAPHY CHEST WITH CONTRAST  TECHNIQUE: Multidetector CT imaging of the chest was performed using the standard protocol during bolus administration of intravenous contrast. Multiplanar CT image reconstructions and MIPs were obtained to evaluate  the vascular anatomy.  CONTRAST:  67m OMNIPAQUE IOHEXOL 350 MG/ML SOLN  COMPARISON:  CT thorax 07/23/2014, PET-CT 10/06/2013  FINDINGS: Mediastinum/Nodes: There are no filling defects within the pulmonary arteries to suggest acute pulmonary embolism. No acute findings aorta great vessels. No pericardial fluid.  There is no axillary supraclavicular adenopathy. There is perihilar masslike thickening on the right which is similar to comparison exam and likely related to surgery and radiation therapy.  Lungs/Pleura: No new airspace disease. No pleural fluid. No pneumothorax.  Upper abdomen: Limited view of the liver, kidneys, pancreas are unremarkable. Normal adrenal glands.  Musculoskeletal: No aggressive osseous lesion.  Review of the MIP images confirms the above findings.  IMPRESSION: 1. No evidence acute pulmonary embolism. 2. Right perihilar bronchovascular thickening and consolidation not changed from prior exam and consistent with postradiation and postsurgical change.   Electronically Signed   By: SSuzy BouchardM.D.   On: 10/25/2014 21:21   Dg Chest Portable 1 View  10/25/2014   CLINICAL DATA:  Increased shortness of breath, diaphoresis, lung cancer status post radiation therapy  EXAM: PORTABLE CHEST - 1 VIEW  COMPARISON:  CT chest dated 07/23/2014  FINDINGS: Right upper lobe/suprahilar mass, likely reflecting radiation changes with apical pleural parenchymal scarring when correlating with prior CT. Recurrent neoplasm cannot be excluded radiographically.  Chronic interstitial markings/emphysematous changes. No pleural effusion or pneumothorax.  Cardiomegaly.  IMPRESSION: Radiation changes/scarring in the right upper lobe. Underlying neoplasm cannot be excluded radiographically.  No evidence of acute cardiopulmonary disease.   Electronically Signed   By: SJulian HyM.D.   On: 10/25/2014 18:36     EKG Interpretation   Date/Time:  Sunday Oct 25 2014 17:13:31 EDT Ventricular Rate:  118 PR  Interval:  160 QRS Duration: 94 QT Interval:  325 QTC Calculation: 455 R Axis:   -23 Text Interpretation:  Sinus tachycardia Borderline left axis deviation  Anteroseptal infarct, age indeterminate Lateral leads are also involved No  significant change was found Confirmed by CAMPOS  MD, KLennette Bihari([id number] on  10/25/2014 5:45:32 PM      MDM   Final diagnoses:  COPD exacerbation  SOB (shortness of breath)  Hypoxia   Pt is a 89yoF with hx of COPD and  hx of right lung cancer s/p chemo and rads in 2012 who presents in acute hypoxic respiratory distress.  She reports cough, increased sputum production and SOB x 3 days, progressively worsening.  Attempted several rounds of home inhaler and used her husband's O2 but was unsuccessful in improving her dyspnea.  She denies frank chest pain and has been afebrile.   EMS was called to her house, who found her sitting upright in bed with ~8 pillows behind her 2/2 dyspnea/orthopnea.  She had an O2 sat in the 80s and BP 180/100 initially.  She received albuterol, atrovent, and was started on a CPAP en route.  Presented acutely uncomfortable but not toxic.  Able to speak in moderate length sentences but somewhat anxious.  O2 sats at 100% on supplemental O2 by BiPAP.  Normotensive.  Lungs with diffuse rhonchi but no wheezes, symmetric breath sounds, with normal air movement.   Will keep on BiPAP and get an ABG to assess treatment response.  CXR and EKG ordered along with labs.  As patient is afebrile, will hold on empiric Abx until the CXR returns.  Likely COPD exacerbation, so she was given solumedrol 125 mg.   CXR shows scarring at right upper lobe from previous lung cancer, not much changed from multiple previous images. No significant edema or pneumonia to explain her sx.   ABG relatively benign.   Taken off the BiPAP machine and transitioned to 3L O2 by [place] and patient tolerated well.    Further discussions with the patient reveal that she has had several  days of scant hemoptysis with her cough.  No gross blood or clots.  Based on relatively benign CXR that doesn't explain the severity of her initial sx and hx of lung cancer/hemoptysis, will get a CTA PE protocol.  Patient informed of the plan and voiced understanding.   Will need admission after the CT scan.   CTA returned negative for PE or pneumonia.  Her sx are most likely 2/2 COPD exacerbation.  Pt informed and voiced understanding.  She is still maintaining her sats on 3L by [place] and looks significantly improved from arrival, but still is tachypneic and with increased work of breathing.   PCP is Dr. Philip Aspen with [facility]  Will admit to hospitalist team, step down unit.   Patient was seen with ED Attending, Dr. Driscilla Moats, MD    Tori Milks, MD 10/26/14 [id number]  Jola Schmidt, MD 10/26/14 [id number]  Jola Schmidt, MD 11/19/14 [phone]

## 2014-10-25 NOTE — Progress Notes (Signed)
Pt requested to come off bipap. Pt placed on 3L [place]. RT will continue to monitor.

## 2014-10-26 ENCOUNTER — Encounter (HOSPITAL_COMMUNITY): Payer: Self-pay | Admitting: Internal Medicine

## 2014-10-26 ENCOUNTER — Inpatient Hospital Stay (HOSPITAL_COMMUNITY): Payer: Commercial Managed Care - HMO

## 2014-10-26 DIAGNOSIS — R0600 Dyspnea, unspecified: Secondary | ICD-10-CM

## 2014-10-26 DIAGNOSIS — E039 Hypothyroidism, unspecified: Secondary | ICD-10-CM

## 2014-10-26 DIAGNOSIS — J441 Chronic obstructive pulmonary disease with (acute) exacerbation: Secondary | ICD-10-CM

## 2014-10-26 DIAGNOSIS — J9621 Acute and chronic respiratory failure with hypoxia: Secondary | ICD-10-CM | POA: Diagnosis present

## 2014-10-26 DIAGNOSIS — J9601 Acute respiratory failure with hypoxia: Secondary | ICD-10-CM

## 2014-10-26 LAB — CBC WITH DIFFERENTIAL/PLATELET
BASOS ABS: 0 10*3/uL (ref 0.0–0.1)
Basophils Relative: 0 % (ref 0–1)
EOS PCT: 0 % (ref 0–5)
Eosinophils Absolute: 0 10*3/uL (ref 0.0–0.7)
HEMATOCRIT: 35.1 % — AB (ref 36.0–46.0)
Hemoglobin: 11.4 g/dL — ABNORMAL LOW (ref 12.0–15.0)
Lymphocytes Relative: 6 % — ABNORMAL LOW (ref 12–46)
Lymphs Abs: 0.2 10*3/uL — ABNORMAL LOW (ref 0.7–4.0)
MCH: 30.2 pg (ref 26.0–34.0)
MCHC: 32.5 g/dL (ref 30.0–36.0)
MCV: 93.1 fL (ref 78.0–100.0)
MONOS PCT: 1 % — AB (ref 3–12)
Monocytes Absolute: 0 10*3/uL — ABNORMAL LOW (ref 0.1–1.0)
NEUTROS ABS: 4 10*3/uL (ref 1.7–7.7)
Neutrophils Relative %: 93 % — ABNORMAL HIGH (ref 43–77)
Platelets: 276 10*3/uL (ref 150–400)
RBC: 3.77 MIL/uL — ABNORMAL LOW (ref 3.87–5.11)
RDW: 13.7 % (ref 11.5–15.5)
WBC: 4.3 10*3/uL (ref 4.0–10.5)

## 2014-10-26 LAB — COMPREHENSIVE METABOLIC PANEL
ALBUMIN: 3.4 g/dL — AB (ref 3.5–5.0)
ALK PHOS: 53 U/L (ref 38–126)
ALT: 11 U/L — ABNORMAL LOW (ref 14–54)
AST: 20 U/L (ref 15–41)
Anion gap: 9 (ref 5–15)
BILIRUBIN TOTAL: 0.6 mg/dL (ref 0.3–1.2)
BUN: 11 mg/dL (ref 6–20)
CALCIUM: 8.4 mg/dL — AB (ref 8.9–10.3)
CO2: 26 mmol/L (ref 22–32)
Chloride: 102 mmol/L (ref 101–111)
Creatinine, Ser: 0.91 mg/dL (ref 0.44–1.00)
GFR calc Af Amer: >60 mL/min (ref >60)
GLUCOSE: 222 mg/dL — AB (ref 70–99)
POTASSIUM: 4 mmol/L (ref 3.5–5.1)
SODIUM: 137 mmol/L (ref 135–145)
Total Protein: 6.9 g/dL (ref 6.5–8.1)

## 2014-10-26 LAB — GLUCOSE, CAPILLARY: GLUCOSE-CAPILLARY: 207 mg/dL — AB (ref 70–99)

## 2014-10-26 LAB — TROPONIN I

## 2014-10-26 LAB — TSH: TSH: 10.647 u[IU]/mL — AB (ref 0.350–4.500)

## 2014-10-26 MED ORDER — FAMOTIDINE 20 MG PO TABS
20.0000 mg | ORAL_TABLET | Freq: Two times a day (BID) | ORAL | Status: DC
  Administered 2014-10-26 – 2014-10-30 (×10): 20 mg via ORAL
  Filled 2014-10-26 (×12): qty 1

## 2014-10-26 MED ORDER — CETYLPYRIDINIUM CHLORIDE 0.05 % MT LIQD
7.0000 mL | Freq: Two times a day (BID) | OROMUCOSAL | Status: DC
  Administered 2014-10-26 (×3): 7 mL via OROMUCOSAL

## 2014-10-26 MED ORDER — IPRATROPIUM BROMIDE 0.02 % IN SOLN: 0.5000 mg | RESPIRATORY_TRACT | Status: DC

## 2014-10-26 MED ORDER — ENOXAPARIN SODIUM 40 MG/0.4ML ~~LOC~~ SOLN
40.0000 mg | Freq: Every day | SUBCUTANEOUS | Status: DC
  Administered 2014-10-26: 40 mg via SUBCUTANEOUS
  Filled 2014-10-26: qty 0.4

## 2014-10-26 MED ORDER — SODIUM CHLORIDE 0.9 % IJ SOLN
3.0000 mL | Freq: Two times a day (BID) | INTRAMUSCULAR | Status: DC
  Administered 2014-10-26 – 2014-10-29 (×8): 3 mL via INTRAVENOUS

## 2014-10-26 MED ORDER — IPRATROPIUM-ALBUTEROL 0.5-2.5 (3) MG/3ML IN SOLN
3.0000 mL | RESPIRATORY_TRACT | Status: DC
  Administered 2014-10-26 (×2): 3 mL via RESPIRATORY_TRACT
  Filled 2014-10-26 (×3): qty 3

## 2014-10-26 MED ORDER — BUDESONIDE 0.25 MG/2ML IN SUSP
0.2500 mg | Freq: Two times a day (BID) | RESPIRATORY_TRACT | Status: DC
  Administered 2014-10-26 – 2014-10-30 (×9): 0.25 mg via RESPIRATORY_TRACT
  Filled 2014-10-26 (×12): qty 2

## 2014-10-26 MED ORDER — MONTELUKAST SODIUM 10 MG PO TABS
10.0000 mg | ORAL_TABLET | Freq: Every day | ORAL | Status: DC
  Administered 2014-10-26 – 2014-10-30 (×5): 10 mg via ORAL
  Filled 2014-10-26 (×5): qty 1

## 2014-10-26 MED ORDER — LORATADINE 10 MG PO TABS
10.0000 mg | ORAL_TABLET | Freq: Every day | ORAL | Status: DC | PRN
  Filled 2014-10-26: qty 1

## 2014-10-26 MED ORDER — LEVOFLOXACIN IN D5W 750 MG/150ML IV SOLN
750.0000 mg | INTRAVENOUS | Status: DC
  Administered 2014-10-27: 750 mg via INTRAVENOUS
  Filled 2014-10-26 (×2): qty 150

## 2014-10-26 MED ORDER — METHYLPREDNISOLONE SODIUM SUCC 40 MG IJ SOLR
40.0000 mg | Freq: Two times a day (BID) | INTRAMUSCULAR | Status: AC
  Administered 2014-10-26 – 2014-10-28 (×6): 40 mg via INTRAVENOUS
  Filled 2014-10-26 (×9): qty 1

## 2014-10-26 MED ORDER — ALBUTEROL SULFATE (2.5 MG/3ML) 0.083% IN NEBU: 2.5000 mg | INHALATION_SOLUTION | RESPIRATORY_TRACT | Status: DC | PRN

## 2014-10-26 MED ORDER — CITALOPRAM HYDROBROMIDE 10 MG PO TABS
10.0000 mg | ORAL_TABLET | Freq: Every day | ORAL | Status: DC
  Administered 2014-10-26 – 2014-10-30 (×5): 10 mg via ORAL
  Filled 2014-10-26 (×5): qty 1

## 2014-10-26 MED ORDER — ONDANSETRON HCL 4 MG/2ML IJ SOLN
4.0000 mg | Freq: Four times a day (QID) | INTRAMUSCULAR | Status: DC | PRN
Start: 2014-10-26 — End: 2014-10-30

## 2014-10-26 MED ORDER — LEVOTHYROXINE SODIUM 112 MCG PO TABS
112.0000 ug | ORAL_TABLET | Freq: Every day | ORAL | Status: DC
  Administered 2014-10-26 – 2014-10-27 (×2): 112 ug via ORAL
  Filled 2014-10-26 (×3): qty 1

## 2014-10-26 MED ORDER — ACETAMINOPHEN 325 MG PO TABS
650.0000 mg | ORAL_TABLET | Freq: Four times a day (QID) | ORAL | Status: DC | PRN
  Administered 2014-10-26 (×2): 650 mg via ORAL
  Filled 2014-10-26 (×2): qty 2

## 2014-10-26 MED ORDER — HYDROCOD POLST-CPM POLST ER 10-8 MG/5ML PO SUER
5.0000 mL | Freq: Once | ORAL | Status: AC
  Administered 2014-10-27: 5 mL via ORAL
  Filled 2014-10-26: qty 5

## 2014-10-26 MED ORDER — ALBUTEROL SULFATE (2.5 MG/3ML) 0.083% IN NEBU: 2.5000 mg | INHALATION_SOLUTION | RESPIRATORY_TRACT | Status: DC

## 2014-10-26 MED ORDER — IPRATROPIUM-ALBUTEROL 0.5-2.5 (3) MG/3ML IN SOLN
3.0000 mL | Freq: Four times a day (QID) | RESPIRATORY_TRACT | Status: DC
  Administered 2014-10-26 – 2014-10-27 (×4): 3 mL via RESPIRATORY_TRACT
  Filled 2014-10-26 (×4): qty 3

## 2014-10-26 MED ORDER — LEVOFLOXACIN IN D5W 750 MG/150ML IV SOLN
750.0000 mg | Freq: Once | INTRAVENOUS | Status: AC
  Administered 2014-10-26: 750 mg via INTRAVENOUS
  Filled 2014-10-26: qty 150

## 2014-10-26 MED ORDER — LEVOFLOXACIN IN D5W 750 MG/150ML IV SOLN: 750.0000 mg | INTRAVENOUS | Status: DC

## 2014-10-26 MED ORDER — GABAPENTIN 600 MG PO TABS
600.0000 mg | ORAL_TABLET | Freq: Every day | ORAL | Status: DC
Start: 2014-10-26 — End: 2014-10-30
  Administered 2014-10-26 – 2014-10-29 (×5): 600 mg via ORAL
  Filled 2014-10-26 (×7): qty 1

## 2014-10-26 MED ORDER — ACETAMINOPHEN 650 MG RE SUPP: 650.0000 mg | Freq: Four times a day (QID) | RECTAL | Status: DC | PRN

## 2014-10-26 MED ORDER — ONDANSETRON HCL 4 MG PO TABS: 4.0000 mg | ORAL_TABLET | Freq: Four times a day (QID) | ORAL | Status: DC | PRN

## 2014-10-26 NOTE — Care Management Note (Addendum)
Case Management Note  Patient Details  Name: [name] MRN: [id number] Date of Birth: 05-10-1946  Subjective/Objective:          Pt from home with husband, admitted with respiratory distress.      Action/Plan:  Return to home when medically stable. CM to f/u with disposition needs. Expected Discharge Date:  10/29/14               Expected Discharge Plan:  Home/Self Care  In-House Referral:     Discharge planning Services  CM Consult  Post Acute Care Choice:    Choice offered to:     DME Arranged:    DME Agency:     HH Arranged:    HH Agency:     Status of Service:  In process, will continue to follow  Medicare Important Message Given:  Yes Date Medicare IM Given:  10/26/14 Medicare IM give by:  Whitman Hero RN Date Additional Medicare IM Given:    Additional Medicare Important Message give by:     If discussed at [place] of Stay Meetings, dates discussed:    Additional Comments:   Emergency contact  , Bruce Mayers [place]  Whitman Hero [place], [place] 10/26/2014, 11:22 AM

## 2014-10-26 NOTE — H&P (Signed)
[facility] History and Physical  Janice Daniels MWU:[id number] DOB: 1945-09-12 DOA: 10/25/2014  Referring physician: Ms. Joya Gaskins. PCP: Donnajean Lopes, MD  Specialists: Dr. Julien Nordmann. Oncologist.  Chief Complaint: shortness of breath.  HPI: Janice Daniels is a 69 y.o. female with known history of small cell lung cancer, COPD, ongoing tobacco abuse, hypothyroidism presents to the ER because of shortness of breath. Patient states over the last 4 days patient has been getting very short of breath with minimal exertion. Denies any chest pain. Has been having productive cough. In the ER patient was found to be wheezing and tachycardic. CT angiogram of the chest done did not show any pulmonary embolism or pneumonic process. On exam patient has bilateral expiratory wheezes and crepitations. Patient was initially placed on BiPAP and after nebulizer patient improved and was changed to nasal cannula. Patient at this time is admitted for acute respiratory failure probably from COPD and there may be a complement of CHF.   Review of Systems: As presented in the history of presenting illness, rest negative.  Past Medical History  Diagnosis Date  . Hypertension   . Hypothyroid   . GERD (gastroesophageal reflux disease)   . COPD (chronic obstructive pulmonary disease)   . Low back pain   . Lung cancer 05/09/2011    RUL  . Depression   . History of radiation therapy 06/05/11 to 07/18/11    lung  . History of radiation therapy 09/13/11 to 09/26/11    brain  . Shingles   . Lung cancer, upper lobe 09/02/2012    rul  . History of chemotherapy     carboplatin and etoposide  . Hx of radiation therapy 10/03/12 - 10/09/12    RUL lung   Past Surgical History  Procedure Laterality Date  . Tubal ligation  1984  . Total abdominal hysterectomy  1986  . Tonsillectomy    . Endobronchial biopsy Right 05/03/2011   Social History:  reports that she has been smoking Cigarettes.  She has a 50  pack-year smoking history. She has never used smokeless tobacco. She reports that she does not drink alcohol or use illicit drugs. Where does patient live home. Can patient participate in ADLs? Yes.  Allergies  Allergen Reactions  . Codeine Other (See Comments)    Elevated pulse    Family History:  Family History  Problem Relation Age of Onset  . Esophageal cancer Father   . Hypothyroidism Father   . Cancer Father     esophageal  . Heart disease Mother   . Hyperlipidemia Mother   . Hypertension Mother     heart disease  . Other Brother     low back pain  . Cancer Brother     esophageal  . Other Brother     hypochondriasis  . Other Brother     suicide  . Esophageal cancer Brother   . Cervical cancer Sister   . Cancer Sister     cervical      Prior to Admission medications   Medication Sig Start Date End Date Taking? Authorizing Provider  albuterol (PROVENTIL) (2.5 MG/3ML) 0.083% nebulizer solution Take 2.5 mg by nebulization 2 (two) times daily.  07/07/14  Yes Historical Provider, MD  albuterol (VENTOLIN HFA) 108 (90 BASE) MCG/ACT inhaler Inhale 2 puffs into the lungs every 6 (six) hours as needed for wheezing or shortness of breath.    Yes Historical Provider, MD  citalopram (CELEXA) 10 MG tablet Take 10 mg by mouth daily.  Yes Historical Provider, MD  Fluticasone-Salmeterol (ADVAIR) 250-50 MCG/DOSE AEPB Inhale 1 puff into the lungs 2 (two) times daily.   Yes Historical Provider, MD  gabapentin (NEURONTIN) 600 MG tablet Take 600 mg by mouth at bedtime. Per BJ at Dr. Shon Baton office.   Yes Historical Provider, MD  Ibuprofen-Diphenhydramine Cit (ADVIL PM PO) Take 1 tablet by mouth at bedtime.   Yes Historical Provider, MD  levothyroxine (SYNTHROID, LEVOTHROID) 112 MCG tablet Take 112 mcg by mouth daily.     Yes Historical Provider, MD  loratadine (CLARITIN) 10 MG tablet Take 10 mg by mouth daily as needed for allergies.    Yes Historical Provider, MD  montelukast  (SINGULAIR) 10 MG tablet Take 10 mg by mouth daily. 08/25/12  Yes Historical Provider, MD  Multiple Vitamin (MULTIVITAMIN WITH MINERALS) TABS tablet Take 1 tablet by mouth daily. Centrum Silver   Yes Historical Provider, MD  OVER THE COUNTER MEDICATION Take 1 tablet by mouth daily as needed (constipation). OTC Stool softener   Yes Historical Provider, MD  OVER THE COUNTER MEDICATION Place 1 drop into both eyes daily as needed (itching). Over the counter eye drops   Yes Historical Provider, MD  ranitidine (ZANTAC) 150 MG tablet Take 150 mg by mouth daily as needed for heartburn (acid reflux).   Yes Historical Provider, MD  tiotropium (SPIRIVA) 18 MCG inhalation capsule Place 18 mcg into inhaler and inhale daily.   Yes Historical Provider, MD  varenicline (CHANTIX) 1 MG tablet Take 1 mg by mouth daily.   Yes Historical Provider, MD  Vitamin D, Ergocalciferol, (DRISDOL) 50000 UNITS CAPS capsule Take 50,000 Units by mouth every 7 (seven) days. Monday   Yes Historical Provider, MD    Physical Exam: Filed Vitals:   10/25/14 2200 10/25/14 2215 10/25/14 2301 10/26/14 0020  BP: 129/84 110/84 134/82   Pulse: 104 98  99  Temp:   97.8 F (36.6 C)   TempSrc:   Oral   Resp: '23 28 20 22  '$ Height:   '5\' 4"'$  (1.626 m)   Weight:   60.8 kg (134 lb 0.6 oz)   SpO2: 98% 99% 100% 100%     General:  Moderately built and poorly nourished.  Eyes: anicteric no pallor.  ENT: no discharge from the ears eyes nose.  Neck: no mass felt. JVD not appreciated.  Cardiovascular: S1-S2 heard.  Respiratory: bilateral expiratory wheeze no crepitations.  Abdomen: soft nontender bowel sounds present.  Skin: no rash.  Musculoskeletal: no edema.  Psychiatric: appears normal.  Neurologic: alert awake oriented to time place and person. Moves all extremities.  Labs on Admission:  Basic Metabolic Panel:  Recent Labs Lab 10/25/14 1716  NA 137  K 4.3  CL 103  CO2 25  GLUCOSE 126*  BUN 10  CREATININE 1.04*   CALCIUM 8.8*   Liver Function Tests: No results for input(s): AST, ALT, ALKPHOS, BILITOT, PROT, ALBUMIN in the last 168 hours. No results for input(s): LIPASE, AMYLASE in the last 168 hours. No results for input(s): AMMONIA in the last 168 hours. CBC:  Recent Labs Lab 10/25/14 1716  WBC 8.6  NEUTROABS 7.0  HGB 12.1  HCT 36.7  MCV 92.9  PLT 318   Cardiac Enzymes: No results for input(s): CKTOTAL, CKMB, CKMBINDEX, TROPONINI in the last 168 hours.  BNP (last 3 results)  Recent Labs  10/25/14 1716  BNP 1083.8*    ProBNP (last 3 results) No results for input(s): PROBNP in the last 8760 hours.  CBG:  Recent Labs Lab 10/25/14 1734  GLUCAP 137*    Radiological Exams on Admission: Ct Angio Chest Pe W/cm &/or Wo Cm  10/25/2014   CLINICAL DATA:  Increase short of breath since Friday. History of right lung cancer.  EXAM: CT ANGIOGRAPHY CHEST WITH CONTRAST  TECHNIQUE: Multidetector CT imaging of the chest was performed using the standard protocol during bolus administration of intravenous contrast. Multiplanar CT image reconstructions and MIPs were obtained to evaluate the vascular anatomy.  CONTRAST:  9m OMNIPAQUE IOHEXOL 350 MG/ML SOLN  COMPARISON:  CT thorax 07/23/2014, PET-CT 10/06/2013  FINDINGS: Mediastinum/Nodes: There are no filling defects within the pulmonary arteries to suggest acute pulmonary embolism. No acute findings aorta great vessels. No pericardial fluid.  There is no axillary supraclavicular adenopathy. There is perihilar masslike thickening on the right which is similar to comparison exam and likely related to surgery and radiation therapy.  Lungs/Pleura: No new airspace disease. No pleural fluid. No pneumothorax.  Upper abdomen: Limited view of the liver, kidneys, pancreas are unremarkable. Normal adrenal glands.  Musculoskeletal: No aggressive osseous lesion.  Review of the MIP images confirms the above findings.  IMPRESSION: 1. No evidence acute pulmonary  embolism. 2. Right perihilar bronchovascular thickening and consolidation not changed from prior exam and consistent with postradiation and postsurgical change.   Electronically Signed   By: SSuzy BouchardM.D.   On: 10/25/2014 21:21   Dg Chest Portable 1 View  10/25/2014   CLINICAL DATA:  Increased shortness of breath, diaphoresis, lung cancer status post radiation therapy  EXAM: PORTABLE CHEST - 1 VIEW  COMPARISON:  CT chest dated 07/23/2014  FINDINGS: Right upper lobe/suprahilar mass, likely reflecting radiation changes with apical pleural parenchymal scarring when correlating with prior CT. Recurrent neoplasm cannot be excluded radiographically.  Chronic interstitial markings/emphysematous changes. No pleural effusion or pneumothorax.  Cardiomegaly.  IMPRESSION: Radiation changes/scarring in the right upper lobe. Underlying neoplasm cannot be excluded radiographically.  No evidence of acute cardiopulmonary disease.   Electronically Signed   By: SJulian HyM.D.   On: 10/25/2014 18:36    EKG: Independently reviewed. Sinus tachycardia.  Assessment/Plan Principal Problem:   Acute respiratory failure with hypoxia Active Problems:   Lung cancer   COPD exacerbation   Hypothyroidism   Acute respiratory failure   1. Acute hypoxic respiratory failure - probably from COPD exacerbation. Patient may have a complement of CHF also given the patient also has increased BNP. At this time I have placed patient on Solu-Medrol Pulmicort and nebulizer along with Levaquin. Cycle cardiac markers and check 2-D echo. Closely following stepdown unit. 2. Acute renal failure - patient's creatinine is increased from baseline. Check urinalysis. Closely follow metabolic panel. 3. Hypothyroidism - check TSH. Continue Synthroid. 4. Tobacco abuse - patient advised to quit smoking. 5. Lung cancer - follow recommendations per Dr. MEarlie Server patient's oncologist.   DVT ProphylaxisLovenox.  Code Status: full code.   Family Communication: discussed with patient.  Disposition Plan: admit to inpatient.    Janice Daniels N. [facility] Pager [phone]  If 7PM-7AM, please contact night-coverage [email or web address] Password [email or web address]5/07/2014, 12:24 AM

## 2014-10-26 NOTE — Progress Notes (Signed)
Physical medicine rehabilitation consult requested chart reviewed. Patient with history of small lung cancer, COPD admitted for shortness of breath 10/26/2014. Current workup is ongoing and will await physical and occupational therapy evaluations to be completed and follow-up with appropriate recommendations

## 2014-10-26 NOTE — Progress Notes (Signed)
[place] TEAM 1 - Stepdown/ICU TEAM Progress Note  Janice Daniels [id number]:[id number] DOB: 10-Oct-1945 DOA: 10/25/2014 PCP: Donnajean Lopes, MD  Admit HPI / Brief Narrative: 69 y.o. female with history of small cell lung cancer, COPD, ongoing tobacco abuse, and hypothyroidism who presented to the ER c/o shortness of breath. Patient stated over 4 days she had been getting very short of breath with minimal exertion and having a productive cough.   In the ER patient was found to be wheezing and tachycardic. CT angiogram of the chest did not show a pulmonary embolism or pneumonic process. On exam patient had expiratory wheezing. Patient was initially placed on BiPAP and after nebulizer patient improved and was changed to nasal cannula.   HPI/Subjective: Patient seen for follow-up visit.  Assessment/Plan:  Acute hypoxic respiratory failure  COPD with acute bronchospastic exacerbation  Mild Acute renal failure  Hypothyroidism  Right upper lobe Lung CA status post radiation therapy plus chemotherapy follow recommendations per Dr. Earlie Server  Tobacco abuse  Code Status: FULL Family Communication: no family present at time of exam Disposition Plan:   Consultants: None  Procedures: none  Antibiotics: Levaquin 5/1 >  DVT prophylaxis: Lovenox  Objective: Blood pressure 99/59, pulse 100, temperature 97.6 F (36.4 C), temperature source Oral, resp. rate 23, height '5\' 4"'$  (1.626 m), weight 60.8 kg (134 lb 0.6 oz), SpO2 99 %.  Intake/Output Summary (Last 24 hours) at 10/26/14 0816 Last data filed at 10/25/14 2355  Gross per 24 hour  Intake      0 ml  Output   1325 ml  Net  -1325 ml   Exam: Follow-up visit completed  Data Reviewed: Basic Metabolic Panel:  Recent Labs Lab 10/25/14 1716 10/26/14 0235  NA 137 137  K 4.3 4.0  CL 103 102  CO2 25 26  GLUCOSE 126* 222*  BUN 10 11  CREATININE 1.04* 0.91  CALCIUM 8.8* 8.4*    CBC:  Recent Labs Lab 10/25/14 1716  10/26/14 0235  WBC 8.6 4.3  NEUTROABS 7.0 4.0  HGB 12.1 11.4*  HCT 36.7 35.1*  MCV 92.9 93.1  PLT 318 276    Liver Function Tests:  Recent Labs Lab 10/26/14 0235  AST 20  ALT 11*  ALKPHOS 53  BILITOT 0.6  PROT 6.9  ALBUMIN 3.4*   Cardiac Enzymes:  Recent Labs Lab 10/26/14 0235  TROPONINI <0.03    CBG:  Recent Labs Lab 10/25/14 1734  GLUCAP 137*    Studies:   Recent x-ray studies have been reviewed in detail by the Attending Physician  Scheduled Meds:  Scheduled Meds: . antiseptic oral rinse  7 mL Mouth Rinse BID  . budesonide (PULMICORT) nebulizer solution  0.25 mg Nebulization BID  . citalopram  10 mg Oral Daily  . enoxaparin (LOVENOX) injection  40 mg Subcutaneous Daily  . famotidine  20 mg Oral BID  . gabapentin  600 mg Oral QHS  . ipratropium-albuterol  3 mL Nebulization Q4H  . [START ON 10/27/2014] levofloxacin (LEVAQUIN) IV  750 mg Intravenous Q48H  . levothyroxine  112 mcg Oral QAC breakfast  . methylPREDNISolone (SOLU-MEDROL) injection  40 mg Intravenous Q12H  . montelukast  10 mg Oral Daily  . sodium chloride  3 mL Intravenous Q12H    Time spent on care of this patient: No charge   Cherene Altes , MD   [facility] Office  [phone] Pager - Text Page per Amion as per below:  On-Call/Text Page:      [email or web address]  password [email or web address]  If 7PM-7AM, please contact night-coverage [email or web address] Password [email or web address] 10/26/2014, 8:16 AM   LOS: 1 day

## 2014-10-26 NOTE — Progress Notes (Signed)
Inpatient Diabetes Program Recommendations  AACE/ADA: New Consensus Statement on Inpatient Glycemic Control (2013)  Target Ranges:  Prepandial:   less than 140 mg/dL      Peak postprandial:   less than 180 mg/dL (1-2 hours)      Critically ill patients:  140 - 180 mg/dL   Recommend CBGs achs during steroid therapy. Thank you  Raoul Pitch BSN, RN,CDE Inpatient Diabetes Coordinator [phone] (team pager)

## 2014-10-27 ENCOUNTER — Inpatient Hospital Stay (HOSPITAL_COMMUNITY): Payer: Commercial Managed Care - HMO

## 2014-10-27 DIAGNOSIS — C3411 Malignant neoplasm of upper lobe, right bronchus or lung: Secondary | ICD-10-CM

## 2014-10-27 DIAGNOSIS — J189 Pneumonia, unspecified organism: Secondary | ICD-10-CM | POA: Diagnosis present

## 2014-10-27 DIAGNOSIS — Z720 Tobacco use: Secondary | ICD-10-CM | POA: Diagnosis present

## 2014-10-27 DIAGNOSIS — N179 Acute kidney failure, unspecified: Secondary | ICD-10-CM | POA: Diagnosis present

## 2014-10-27 DIAGNOSIS — E038 Other specified hypothyroidism: Secondary | ICD-10-CM

## 2014-10-27 LAB — BASIC METABOLIC PANEL
Anion gap: 9 (ref 5–15)
BUN: 17 mg/dL (ref 6–20)
CHLORIDE: 101 mmol/L (ref 101–111)
CO2: 27 mmol/L (ref 22–32)
Calcium: 8.9 mg/dL (ref 8.9–10.3)
Creatinine, Ser: 0.95 mg/dL (ref 0.44–1.00)
GFR calc Af Amer: >60 mL/min (ref >60)
GFR, EST NON AFRICAN AMERICAN: 60 mL/min — AB (ref >60)
GLUCOSE: 173 mg/dL — AB (ref 70–99)
Potassium: 4.4 mmol/L (ref 3.5–5.1)
SODIUM: 137 mmol/L (ref 135–145)

## 2014-10-27 LAB — T4, FREE: Free T4: 0.59 ng/dL — ABNORMAL LOW (ref 0.61–1.12)

## 2014-10-27 MED ORDER — SODIUM CHLORIDE 0.9 % IV SOLN: INTRAVENOUS | Status: DC

## 2014-10-27 MED ORDER — FUROSEMIDE 10 MG/ML IJ SOLN
40.0000 mg | Freq: Once | INTRAMUSCULAR | Status: AC
  Administered 2014-10-27: 40 mg via INTRAVENOUS

## 2014-10-27 MED ORDER — DM-GUAIFENESIN ER 30-600 MG PO TB12
1.0000 | ORAL_TABLET | Freq: Two times a day (BID) | ORAL | Status: DC
  Administered 2014-10-27: 1 via ORAL
  Filled 2014-10-27: qty 1

## 2014-10-27 MED ORDER — IPRATROPIUM-ALBUTEROL 0.5-2.5 (3) MG/3ML IN SOLN
3.0000 mL | Freq: Four times a day (QID) | RESPIRATORY_TRACT | Status: DC
  Administered 2014-10-27 – 2014-10-30 (×10): 3 mL via RESPIRATORY_TRACT
  Filled 2014-10-27 (×11): qty 3

## 2014-10-27 MED ORDER — VANCOMYCIN HCL IN DEXTROSE 1-5 GM/200ML-% IV SOLN
1000.0000 mg | Freq: Once | INTRAVENOUS | Status: AC
  Administered 2014-10-27: 1000 mg via INTRAVENOUS
  Filled 2014-10-27: qty 200

## 2014-10-27 MED ORDER — FUROSEMIDE 10 MG/ML IJ SOLN
INTRAMUSCULAR | Status: AC
  Filled 2014-10-27: qty 4

## 2014-10-27 MED ORDER — DM-GUAIFENESIN ER 30-600 MG PO TB12
2.0000 | ORAL_TABLET | Freq: Two times a day (BID) | ORAL | Status: DC
Start: 2014-10-27 — End: 2014-10-30
  Administered 2014-10-27 – 2014-10-30 (×6): 2 via ORAL
  Filled 2014-10-27 (×8): qty 2

## 2014-10-27 MED ORDER — LEVOTHYROXINE SODIUM 150 MCG PO TABS
150.0000 ug | ORAL_TABLET | Freq: Every day | ORAL | Status: DC
  Administered 2014-10-28 – 2014-10-30 (×3): 150 ug via ORAL
  Filled 2014-10-27 (×6): qty 1

## 2014-10-27 MED ORDER — VANCOMYCIN HCL 500 MG IV SOLR
500.0000 mg | Freq: Two times a day (BID) | INTRAVENOUS | Status: DC
  Administered 2014-10-28 – 2014-10-29 (×2): 500 mg via INTRAVENOUS
  Filled 2014-10-27 (×5): qty 500

## 2014-10-27 MED ORDER — DEXTROSE 5 % IV SOLN
1.0000 g | INTRAVENOUS | Status: DC
  Administered 2014-10-27 – 2014-10-28 (×2): 1 g via INTRAVENOUS
  Filled 2014-10-27 (×3): qty 1

## 2014-10-27 MED ORDER — IPRATROPIUM-ALBUTEROL 0.5-2.5 (3) MG/3ML IN SOLN: 3.0000 mL | Freq: Four times a day (QID) | RESPIRATORY_TRACT | Status: DC

## 2014-10-27 NOTE — Progress Notes (Signed)
[place] TEAM 1 - Stepdown/ICU TEAM Progress Note  Janice Daniels:[id number] DOB: Aug 07, 1945 DOA: 10/25/2014 PCP: Donnajean Lopes, MD  Admit HPI / Brief Narrative: Janice Daniels is a 69 y.o. WF PMHx HTN, hypothyroid, Small cell lung cancer, COPD, ongoing tobacco abuse, hypothyroidism  Presents to the ER because of shortness of breath. Patient states over the last 4 days patient has been getting very short of breath with minimal exertion. Denies any chest pain. Has been having productive cough. In the ER patient was found to be wheezing and tachycardic. CT angiogram of the chest done did not show any pulmonary embolism or pneumonic process. On exam patient has bilateral expiratory wheezes and crepitations. Patient was initially placed on BiPAP and after nebulizer patient improved and was changed to nasal cannula. Patient at this time is admitted for acute respiratory failure probably from COPD and there may be a complement of CHF.  NOTE; S/P Systemic chemotherapy with Carboplatin & Etoposide & RUL XRT   HPI/Subjective: 5/3 A/O 4, states uses her husband's home O2 at 2 1/2 L via [place] in order to maintain her saturations. States positive diaphoresis, fever at home however did not measure. States can feel thick phlegm in her chest but unable to expectorate. States visits assisted living home 3 times per week.  Assessment/Plan: Acute hypoxic respiratory failure/HCAP -Most likely multifactorial to include patient's continued tobacco use, previous Etoposide, XRT, and exposure to pathogens at the assisted living facility which she frequents 3 times per week. -Secondary to patient's exposure at assisted-living facility and her known lung pathology will treat as HCAP; start cefepime +vancomycin -Recommend patient CT chest high-resolution which would delineate XRT/Etoposide damage, patient however requests to hold on study secondary to RE having a scheduled study with her oncologist Dr.  Earlie Server  COPD with acute bronchospastic exacerbation -DuoNeb QID  -Mucinex DM -Flutter valve -Normal saline 24m/hr; monitor for fluid overload -If still unable to clear phlegm will start patient on Mucomyst  CHF? -Obtain echocardiogram  Mild Acute renal failure -Improved  Dehydration -See COPD  Hypothyroidism  -Increase Synthroid 150 g daily  Right upper lobe Lung CA status post radiation therapy plus chemotherapy -follow recommendations per Dr. MEarlie Server Tobacco abuse -Patient states stopped smoking~3 weeks ago.  Code Status: FULL Family Communication: family present at time of exam Disposition Plan: CIR vs SNF    Consultants: None  Procedure/Significant Events: 5/1 CT angiogram chest PE protocol;- No PE - Right perihilar bronchovascular thickening and consolidation Unchanged; c/w postradiation and postsurgical change. 5/3 PCXR; negative pulmonary edema-Right upper lobe/perihilar radiation changes with right apical scarring.   Culture 5/3 Influenza pending 5/3 sputum pending 5/3 strep pneumo urine antigen pending 5/3 Legionella urine antigen pending   Antibiotics: Levofloxacin 5/1>> stopped 5/3 Cefepime 5/3>> Vancomycin 5/3>>  DVT prophylaxis: Lovenox   Devices    LINES / TUBES:      Continuous Infusions: . sodium chloride 75 mL/hr at 10/27/14 1630    Objective: VITAL SIGNS: Temp: 98.1 F (36.7 C) (05/03 1943) Temp Source: Oral (05/03 1943) BP: 122/78 mmHg (05/03 1946) Pulse Rate: 103 (05/03 2005) SPO2; FIO2:   Intake/Output Summary (Last 24 hours) at 10/27/14 2105 Last data filed at 10/27/14 1947  Gross per 24 hour  Intake    630 ml  Output    300 ml  Net    330 ml     Exam: General: A/O 4, acute on chronic respiratory distress respiratory distress, mucous membranes are dry, positive tenting of skin  Lungs: diffuse rhonchi greatest in her RUL, mild inspiratory and expiratory wheezes . NO crackles Cardiovascular:  Regular rate and rhythm without murmur gallop or rub normal S1 and S2 Abdomen: Nontender, nondistended, soft, bowel sounds positive, no rebound, no ascites, no appreciable mass Extremities: No significant cyanosis, clubbing, or edema bilateral lower extremities  Data Reviewed: Basic Metabolic Panel:  Recent Labs Lab 10/25/14 1716 10/26/14 0235 10/27/14 0309  NA 137 137 137  K 4.3 4.0 4.4  CL 103 102 101  CO2 '25 26 27  '$ GLUCOSE 126* 222* 173*  BUN '10 11 17  '$ CREATININE 1.04* 0.91 0.95  CALCIUM 8.8* 8.4* 8.9   Liver Function Tests:  Recent Labs Lab 10/26/14 0235  AST 20  ALT 11*  ALKPHOS 53  BILITOT 0.6  PROT 6.9  ALBUMIN 3.4*   No results for input(s): LIPASE, AMYLASE in the last 168 hours. No results for input(s): AMMONIA in the last 168 hours. CBC:  Recent Labs Lab 10/25/14 1716 10/26/14 0235  WBC 8.6 4.3  NEUTROABS 7.0 4.0  HGB 12.1 11.4*  HCT 36.7 35.1*  MCV 92.9 93.1  PLT 318 276   Cardiac Enzymes:  Recent Labs Lab 10/26/14 0235  TROPONINI <0.03   BNP (last 3 results)  Recent Labs  10/25/14 1716  BNP 1083.8*    ProBNP (last 3 results) No results for input(s): PROBNP in the last 8760 hours.  CBG:  Recent Labs Lab 10/25/14 1734 10/26/14 1534  GLUCAP 137* 207*    No results found for this or any previous visit (from the past 240 hour(s)).   Studies:  Recent x-ray studies have been reviewed in detail by the Attending Physician  Scheduled Meds:  Scheduled Meds: . budesonide (PULMICORT) nebulizer solution  0.25 mg Nebulization BID  . ceFEPime (MAXIPIME) IV  1 g Intravenous Q24H  . citalopram  10 mg Oral Daily  . dextromethorphan-guaiFENesin  2 tablet Oral BID  . famotidine  20 mg Oral BID  . gabapentin  600 mg Oral QHS  . ipratropium-albuterol  3 mL Nebulization Q6H  . [START ON 10/28/2014] levothyroxine  150 mcg Oral QAC breakfast  . methylPREDNISolone (SOLU-MEDROL) injection  40 mg Intravenous Q12H  . montelukast  10 mg Oral  Daily  . sodium chloride  3 mL Intravenous Q12H  . [START ON 10/28/2014] vancomycin  500 mg Intravenous Q12H    Time spent on care of this patient: 40 mins   Elyza Whitt, Geraldo Docker , MD  [facility] Office  [phone] Pager - [phone]  On-Call/Text Page:      [email or web address]      password [email or web address]  If 7PM-7AM, please contact night-coverage [email or web address] Password [email or web address] 10/27/2014, 9:05 PM   LOS: 2 days   Care during the described time interval was provided by me .  I have reviewed this patient's available data, including medical history, events of note, physical examination, radiology studies and test results as part of my evaluation  Dia Crawford, MD [phone] Pager

## 2014-10-27 NOTE — Progress Notes (Signed)
RN notified about pts breathing. BBS CR.  RT will follow and page Dr Thereasa Solo.

## 2014-10-27 NOTE — Progress Notes (Signed)
ANTIBIOTIC CONSULT NOTE - INITIAL  Pharmacy Consult:  Vancomycin / Cefepime Indication:  HCAP  Allergies  Allergen Reactions  . Codeine Other (See Comments)    Elevated pulse    Patient Measurements: Height: '5\' 4"'$  (162.6 cm) Weight: 134 lb 0.6 oz (60.8 kg) IBW/kg (Calculated) : 54.7  Vital Signs: Temp: 97 F (36.1 C) (05/03 1500) Temp Source: Axillary (05/03 1500) BP: 126/82 mmHg (05/03 0800) Pulse Rate: 103 (05/03 0800) Intake/Output from previous day: 05/02 0701 - 05/03 0700 In: 1350 [P.O.:1200; IV Piggyback:150] Out: 1300 [Urine:1300] Intake/Output from this shift: Total I/O In: 240 [P.O.:240] Out: -   Labs:  Recent Labs  10/25/14 1716 10/26/14 0235 10/27/14 0309  WBC 8.6 4.3  --   HGB 12.1 11.4*  --   PLT 318 276  --   CREATININE 1.04* 0.91 0.95   Estimated Creatinine Clearance: 48.3 mL/min (by C-G formula based on Cr of 0.95). No results for input(s): VANCOTROUGH, VANCOPEAK, VANCORANDOM, GENTTROUGH, GENTPEAK, GENTRANDOM, TOBRATROUGH, TOBRAPEAK, TOBRARND, AMIKACINPEAK, AMIKACINTROU, AMIKACIN in the last 72 hours.   Microbiology: No results found for this or any previous visit (from the past 720 hour(s)).  Medical History: Past Medical History  Diagnosis Date  . Hypertension   . Hypothyroid   . GERD (gastroesophageal reflux disease)   . COPD (chronic obstructive pulmonary disease)   . Low back pain   . Lung cancer 05/09/2011    RUL  . Depression   . History of radiation therapy 06/05/11 to 07/18/11    lung  . History of radiation therapy 09/13/11 to 09/26/11    brain  . Shingles   . Lung cancer, upper lobe 09/02/2012    rul  . History of chemotherapy     carboplatin and etoposide  . Hx of radiation therapy 10/03/12 - 10/09/12    RUL lung      Assessment: 47 YOF with history of lung cancer, tobacco use, and COPD presented with SOB on 10/25/14.  Pharmacy consulted to initiate vancomycin and cefepime for HCAP.  Patient's renal function is  stable.  Vanc 5/3 >> Cefepime 5/3 >> LVQ 5/2 >> 5/3   Goal of Therapy:  Vancomycin trough level 15-20 mcg/ml   Plan:  - Vanc 1gm IV x 1, then '500mg'$  IV Q12H - Cefepime 1gm IV Q24H - Monitor renal fxn, clinical progress, vanc trough as indicated    Carlei Huang D. Mina Marble, PharmD, BCPS Pager:  [phone] 10/27/2014, 4:06 PM

## 2014-10-28 LAB — CBC WITH DIFFERENTIAL/PLATELET
Basophils Absolute: 0 10*3/uL (ref 0.0–0.1)
Basophils Relative: 0 % (ref 0–1)
EOS ABS: 0 10*3/uL (ref 0.0–0.7)
Eosinophils Relative: 0 % (ref 0–5)
HEMATOCRIT: 34.1 % — AB (ref 36.0–46.0)
HEMOGLOBIN: 11.1 g/dL — AB (ref 12.0–15.0)
Lymphocytes Relative: 4 % — ABNORMAL LOW (ref 12–46)
Lymphs Abs: 0.4 10*3/uL — ABNORMAL LOW (ref 0.7–4.0)
MCH: 30.9 pg (ref 26.0–34.0)
MCHC: 32.6 g/dL (ref 30.0–36.0)
MCV: 95 fL (ref 78.0–100.0)
MONO ABS: 0.3 10*3/uL (ref 0.1–1.0)
Monocytes Relative: 3 % (ref 3–12)
NEUTROS ABS: 9.3 10*3/uL — AB (ref 1.7–7.7)
Neutrophils Relative %: 93 % — ABNORMAL HIGH (ref 43–77)
Platelets: 287 10*3/uL (ref 150–400)
RBC: 3.59 MIL/uL — AB (ref 3.87–5.11)
RDW: 14.2 % (ref 11.5–15.5)
WBC: 10 10*3/uL (ref 4.0–10.5)

## 2014-10-28 LAB — COMPREHENSIVE METABOLIC PANEL
ALT: 10 U/L — AB (ref 14–54)
AST: 17 U/L (ref 15–41)
Albumin: 3.4 g/dL — ABNORMAL LOW (ref 3.5–5.0)
Alkaline Phosphatase: 42 U/L (ref 38–126)
Anion gap: 10 (ref 5–15)
BUN: 18 mg/dL (ref 6–20)
CALCIUM: 8.6 mg/dL — AB (ref 8.9–10.3)
CO2: 27 mmol/L (ref 22–32)
Chloride: 100 mmol/L — ABNORMAL LOW (ref 101–111)
Creatinine, Ser: 0.95 mg/dL (ref 0.44–1.00)
GFR calc Af Amer: >60 mL/min (ref >60)
GFR calc non Af Amer: 60 mL/min — ABNORMAL LOW (ref >60)
Glucose, Bld: 132 mg/dL — ABNORMAL HIGH (ref 70–99)
Potassium: 4.5 mmol/L (ref 3.5–5.1)
SODIUM: 137 mmol/L (ref 135–145)
Total Bilirubin: 0.4 mg/dL (ref 0.3–1.2)
Total Protein: 6.5 g/dL (ref 6.5–8.1)

## 2014-10-28 LAB — INFLUENZA PANEL BY PCR (TYPE A & B)
H1N1 flu by pcr: NOT DETECTED
Influenza A By PCR: NEGATIVE
Influenza B By PCR: NEGATIVE

## 2014-10-28 LAB — HEMOGLOBIN A1C
Hgb A1c MFr Bld: 6 % — ABNORMAL HIGH (ref 4.8–5.6)
Mean Plasma Glucose: 126 mg/dL

## 2014-10-28 MED ORDER — ASPIRIN EC 81 MG PO TBEC
81.0000 mg | DELAYED_RELEASE_TABLET | Freq: Every day | ORAL | Status: DC
  Administered 2014-10-28 – 2014-10-30 (×3): 81 mg via ORAL
  Filled 2014-10-28 (×4): qty 1

## 2014-10-28 MED ORDER — ENOXAPARIN SODIUM 40 MG/0.4ML ~~LOC~~ SOLN
40.0000 mg | SUBCUTANEOUS | Status: DC
  Administered 2014-10-28 – 2014-10-30 (×3): 40 mg via SUBCUTANEOUS
  Filled 2014-10-28 (×3): qty 0.4

## 2014-10-28 MED ORDER — PREDNISONE 20 MG PO TABS
40.0000 mg | ORAL_TABLET | Freq: Every day | ORAL | Status: DC
  Administered 2014-10-29 – 2014-10-30 (×2): 40 mg via ORAL
  Filled 2014-10-28 (×4): qty 2

## 2014-10-28 MED ORDER — LISINOPRIL 2.5 MG PO TABS
2.5000 mg | ORAL_TABLET | Freq: Every day | ORAL | Status: DC
  Administered 2014-10-28 – 2014-10-29 (×2): 2.5 mg via ORAL
  Filled 2014-10-28 (×3): qty 1

## 2014-10-28 NOTE — Progress Notes (Signed)
Pt tx [facility] per MD order, pt tol well, pt VSS, pt verbalized understanding of tx, report called to receiving RN all questions answered

## 2014-10-28 NOTE — Progress Notes (Addendum)
[facility] TEAM 1 - Stepdown/ICU TEAM Progress Note  Janice Daniels TFT:[id number] DOB: 11-05-1945 DOA: 10/25/2014 PCP: Donnajean Lopes, MD  Admit HPI / Brief Narrative: 69 y.o. female with history of small cell lung cancer, COPD, ongoing tobacco abuse, and hypothyroidism who presented to the ER c/o shortness of breath. Patient stated over 4 days she had been getting very short of breath with minimal exertion and having a productive cough.   In the ER patient was found to be wheezing and tachycardic. CT angiogram of the chest did not show a pulmonary embolism or pneumonic process. On exam patient had expiratory wheezing. Patient was initially placed on BiPAP and after nebulizer tx the  patient improved and was changed to nasal cannula.   HPI/Subjective: The patient is resting comfortably in her hospital bed.  She feels that her shortness of breath has improved but is not yet back to baseline.  She has not yet ambulated to a significant extent.  She denies chest pain abdominal pain nausea vomiting or diarrhea.  She tells me that she quit smoking approximately 1 week ago.  Assessment/Plan:  Acute hypoxic respiratory failure likely multifactorial to include patient's continued tobacco use, previous Etoposide, XRT, and exposure to pathogens at the assisted living facility which she frequents 3 times per week - secondary to exposure at assisted-living facility treating as HCAP w/ cefepime +vanc - influenza panel negative  COPD with acute bronchospastic exacerbation Modest ongoing wheezing appreciable but expiratory phase not prolonged inpatient in no distress was slowly improving exam - continue current medical therapy - begin ambulating  Newly diagnosed systolic congestive heart failure TTE notes EF 35-40% with diffuse hypokinesis - no baseline echocardiogram is available for review - clinically there is no evidence of volume overload with no JVD and no lower extremity edema - follow I's and  O's and daily weights - initiate low dose ACE inhibitor - in future if blood pressure and bronchospasm will allow consider adding beta blocker - given diffuse pattern I do not feel compelled to initiate inpatient CAD screening but will suggest referral to outpatient cardiology at time of discharge  Mild Acute renal failure Resolved - GFR now greater than 60  Hypothyroidism TSH markedly elevated at 10.6 - free T4 low at 0.59 - Synthroid dose increased  Right upper lobe Lung CA diagnosed October 2012 status post radiation therapy plus chemotherapy for follow-up scanning in August following 6 month observation period which began in February at time of last [place] office visit   Tobacco abuse I have congratulated the patient on stopping smoking approximately 1 week ago - I have counseled her at length as to the absolute need to stick with this and that this is unquestionably the most important thing she can do to help care for her lung disease   Code Status: FULL Family Communication: no family present at time of exam Disposition Plan: Transfer to medical bed - titrate oxygen (not on home O2) - PT/OT - ?Discharge 48 hours  Consultants: None  Procedures: none  Antibiotics: Levaquin 5/1 > 5/2 Cefepime 5/3 > Vanc 5/3 >  DVT prophylaxis: Lovenox  Objective: Blood pressure 109/72, pulse 104, temperature 98.1 F (36.7 C), temperature source Oral, resp. rate 17, height '5\' 4"'$  (1.626 m), weight 60.8 kg (134 lb 0.6 oz), SpO2 95 %.  Intake/Output Summary (Last 24 hours) at 10/28/14 0950 Last data filed at 10/28/14 0600  Gross per 24 hour  Intake 1492.5 ml  Output    900 ml  Net  592.5 ml   Exam: General: No acute respiratory distress when resting in bed  Lungs: Diffuse expiratory wheeze without focal crackles with modest to good air movement throughout all fields  Cardiovascular: Regular rate and rhythm without murmur gallop or rub normal S1 and S2 - No JVD  Abdomen: Nontender,  nondistended, soft, bowel sounds positive, no rebound, no ascites, no appreciable mass Extremities: No significant cyanosis, clubbing, or edema bilateral lower extremities   Data Reviewed: Basic Metabolic Panel:  Recent Labs Lab 10/25/14 1716 10/26/14 0235 10/27/14 0309 10/28/14 0257  NA 137 137 137 137  K 4.3 4.0 4.4 4.5  CL 103 102 101 100*  CO2 '25 26 27 27  '$ GLUCOSE 126* 222* 173* 132*  BUN '10 11 17 18  '$ CREATININE 1.04* 0.91 0.95 0.95  CALCIUM 8.8* 8.4* 8.9 8.6*    CBC:  Recent Labs Lab 10/25/14 1716 10/26/14 0235 10/28/14 0257  WBC 8.6 4.3 10.0  NEUTROABS 7.0 4.0 9.3*  HGB 12.1 11.4* 11.1*  HCT 36.7 35.1* 34.1*  MCV 92.9 93.1 95.0  PLT 318 276 287    Liver Function Tests:  Recent Labs Lab 10/26/14 0235 10/28/14 0257  AST 20 17  ALT 11* 10*  ALKPHOS 53 42  BILITOT 0.6 0.4  PROT 6.9 6.5  ALBUMIN 3.4* 3.4*   Cardiac Enzymes:  Recent Labs Lab 10/26/14 0235  TROPONINI <0.03    CBG:  Recent Labs Lab 10/25/14 1734 10/26/14 1534  GLUCAP 137* 207*    Studies:   Recent x-ray studies have been reviewed in detail by the Attending Physician  Scheduled Meds:  Scheduled Meds: . budesonide (PULMICORT) nebulizer solution  0.25 mg Nebulization BID  . ceFEPime (MAXIPIME) IV  1 g Intravenous Q24H  . citalopram  10 mg Oral Daily  . dextromethorphan-guaiFENesin  2 tablet Oral BID  . famotidine  20 mg Oral BID  . gabapentin  600 mg Oral QHS  . ipratropium-albuterol  3 mL Nebulization Q6H  . levothyroxine  150 mcg Oral QAC breakfast  . methylPREDNISolone (SOLU-MEDROL) injection  40 mg Intravenous Q12H  . montelukast  10 mg Oral Daily  . sodium chloride  3 mL Intravenous Q12H  . vancomycin  500 mg Intravenous Q12H    Time spent on care of this patient: 35 minutes   MCCLUNG,JEFFREY T , MD   [facility] Office  [phone] Pager - Text Page per Shea Evans as per below:  On-Call/Text Page:      [email or web address]      password [email or web address]  If 7PM-7AM,  please contact night-coverage [email or web address] Password [email or web address] 10/28/2014, 9:50 AM   LOS: 3 days

## 2014-10-28 NOTE — Evaluation (Signed)
Physical Therapy Evaluation Patient Details Name: Janice Daniels MRN: [id number] DOB: February 02, 1946 Today's Date: 10/28/2014   History of Present Illness  Janice Daniels is a 69 y.o. female with known history of small cell lung cancer, COPD, ongoing tobacco abuse, hypothyroidism presents to the ER because of shortness of breath. Patient states over the last 4 days patient has been getting very short of breath with minimal exertion. Denies any chest pain. Has been having productive cough. In the ER patient was found to be wheezing and tachycardic. CT angiogram of the chest done did not show any pulmonary embolism or pneumonic process. On exam patient has bilateral expiratory wheezes and crepitations. Patient was initially placed on BiPAP and after nebulizer patient improved and was changed to nasal cannula. Patient at this time is admitted for acute respiratory failure probably from COPD and there may be a complement of CHF. PMHx: lung CA with chemo/radiation  Clinical Impression  Pt admitted with/for SOB due to COPD/? PNA.  Pt currently limited functionally due to the problems listed below.  (see problems list.)  Pt will benefit from PT to maximize function and safety to be able to get home safely with available assist of family.     Follow Up Recommendations Home health PT;Supervision for mobility/OOB    Equipment Recommendations  Other (comment) (TBA)    Recommendations for Other Services       Precautions / Restrictions Precautions Precautions: Fall      Mobility  Bed Mobility Overal bed mobility: Needs Assistance Bed Mobility: Supine to Sit     Supine to sit: Supervision        Transfers Overall transfer level: Needs assistance   Transfers: Sit to/from Stand Sit to Stand: Min guard            Ambulation/Gait Ambulation/Gait assistance: Min assist Ambulation Distance (Feet): 330 Feet Assistive device: None;1 person hand held assist Gait  Pattern/deviations: Step-through pattern Gait velocity: slower Gait velocity interpretation: Below normal speed for age/gender General Gait Details: mildly unsteady with drift  Stairs            Wheelchair Mobility    Modified Rankin (Stroke Patients Only)       Balance Overall balance assessment: Needs assistance Sitting-balance support: No upper extremity supported Sitting balance-Leahy Scale: Good     Standing balance support: No upper extremity supported Standing balance-Leahy Scale: Fair                               Pertinent Vitals/Pain Pain Assessment: Faces Faces Pain Scale: Hurts a little bit Pain Location: stomach Pain Descriptors / Indicators: Sore Pain Intervention(s): Monitored during session    Home Living Family/patient expects to be discharged to:: Private residence Living Arrangements: Spouse/significant other Available Help at Discharge: Family;Available 24 hours/day Type of Home: House Home Access: Stairs to enter Entrance Stairs-Rails: Psychiatric nurse of Steps: 2-3 Home Layout: One level Home Equipment: None;Other (comment) (TBA)      Prior Function Level of Independence: Independent               Hand Dominance        Extremity/Trunk Assessment   Upper Extremity Assessment: Overall WFL for tasks assessed           Lower Extremity Assessment: Overall WFL for tasks assessed (mild proximal weakness and truncal weakness)      Cervical / Trunk Assessment: Normal  Communication   Communication:  No difficulties  Cognition Arousal/Alertness: Awake/alert Behavior During Therapy: WFL for tasks assessed/performed Overall Cognitive Status: Within Functional Limits for tasks assessed                      General Comments General comments (skin integrity, edema, etc.): SpO2 on RA maintained in the mid 90's % with EHR in gth 100's  Frothy/crackling breath sounds.  Pt unable to cough  anything significant.    Exercises        Assessment/Plan    PT Assessment Patient needs continued PT services  PT Diagnosis Generalized weakness;Other (comment) (decr activity tolerance)   PT Problem List Decreased strength;Decreased activity tolerance;Decreased mobility;Decreased knowledge of use of DME;Cardiopulmonary status limiting activity  PT Treatment Interventions DME instruction;Gait training;Stair training;Functional mobility training;Therapeutic activities;Patient/family education   PT Goals (Current goals can be found in the Care Plan section) Acute Rehab PT Goals Patient Stated Goal: Home feeling better PT Goal Formulation: With patient Time For Goal Achievement: 11/11/14 Potential to Achieve Goals: Good    Frequency Min 3X/week   Barriers to discharge        Co-evaluation               End of Session   Activity Tolerance: Patient tolerated treatment well;Patient limited by fatigue Patient left: in chair;with call bell/phone within reach Nurse Communication: Mobility status         Time: 6294-7654 PT Time Calculation (min) (ACUTE ONLY): 30 min   Charges:   PT Evaluation $Initial PT Evaluation Tier I: 1 Procedure PT Treatments $Gait Training: 8-22 mins   PT G Codes:        Naythan Douthit, Tessie Fass 10/28/2014, 2:53 PM 10/28/2014  Donnella Sham, PT [phone]  (pager)

## 2014-10-28 NOTE — Progress Notes (Signed)
Attempted ABG x2. Unsuccessful. Patinet refuses to be stuck again for ABG. Pt in no distress at this time. Pt is on 2 LPM nasal cannula SPO2 98%.

## 2014-10-28 NOTE — Progress Notes (Signed)
NURSING PROGRESS NOTE  ROSAMUND [place]LAND [id number] Transfer Data: 10/28/2014 6:21 PM Attending Provider: Cherene Altes, MD UVJ:[id number],OIPPGF G, MD Code Status: FULL  MCKENSI REDINGER is a 69 y.o. female patient transferred from [place] -No acute distress noted.   -No complaints of chest pain.   Blood pressure 132/71, pulse 108, temperature 98.1 F (36.7 C), temperature source Oral, resp. rate 17, height '5\' 4"'$  (1.626 m), weight 60.8 kg (134 lb 0.6 oz), SpO2 94 %.   IV Fluids:  IV in place, occlusive dsg intact without redness, IV cath Right arm, SL.  Allergies:  Codeine  Past Medical History:   has a past medical history of Hypertension; Hypothyroid; GERD (gastroesophageal reflux disease); COPD (chronic obstructive pulmonary disease); Low back pain; Lung cancer (05/09/2011); Depression; History of radiation therapy (06/05/11 to 07/18/11); History of radiation therapy (09/13/11 to 09/26/11); Shingles; Lung cancer, upper lobe (09/02/2012); History of chemotherapy; and radiation therapy (10/03/12 - 10/09/12).  Past Surgical History:   has past surgical history that includes Tubal ligation (1984); Total abdominal hysterectomy (1986); Tonsillectomy; and endobronchial biopsy (Right, 05/03/2011).  Social History:   reports that she has been smoking Cigarettes.  She has a 50 pack-year smoking history. She has never used smokeless tobacco. She reports that she does not drink alcohol or use illicit drugs.  Skin:Intact  Patient/Family orientated to room. Information packet given to patient/family. Admission inpatient armband information verified with patient/family to include name and date of birth and placed on patient arm. Side rails up x 2, fall assessment and education completed with patient/family. Patient/family able to verbalize understanding of risk associated with falls and verbalized understanding to call for assistance before getting out of bed. Call light within reach.  Patient/family able to voice and demonstrate understanding of unit orientation instructions.    Will continue to evaluate and treat per MD orders.

## 2014-10-29 ENCOUNTER — Encounter (HOSPITAL_COMMUNITY): Payer: Self-pay | Admitting: Cardiology

## 2014-10-29 DIAGNOSIS — I5023 Acute on chronic systolic (congestive) heart failure: Principal | ICD-10-CM

## 2014-10-29 LAB — BASIC METABOLIC PANEL
Anion gap: 7 (ref 5–15)
BUN: 16 mg/dL (ref 6–20)
CALCIUM: 8.7 mg/dL — AB (ref 8.9–10.3)
CO2: 31 mmol/L (ref 22–32)
Chloride: 101 mmol/L (ref 101–111)
Creatinine, Ser: 0.82 mg/dL (ref 0.44–1.00)
GFR calc Af Amer: >60 mL/min (ref >60)
GFR calc non Af Amer: >60 mL/min (ref >60)
GLUCOSE: 132 mg/dL — AB (ref 70–99)
Potassium: 3.9 mmol/L (ref 3.5–5.1)
Sodium: 139 mmol/L (ref 135–145)

## 2014-10-29 LAB — LEGIONELLA ANTIGEN, URINE

## 2014-10-29 LAB — LIPID PANEL
Cholesterol: 232 mg/dL — ABNORMAL HIGH (ref 0–200)
HDL: 61 mg/dL (ref >40)
LDL Cholesterol: 151 mg/dL — ABNORMAL HIGH (ref 0–99)
TRIGLYCERIDES: 100 mg/dL (ref <150)
Total CHOL/HDL Ratio: 3.8 RATIO
VLDL: 20 mg/dL (ref 0–40)

## 2014-10-29 LAB — CBC
HCT: 37.1 % (ref 36.0–46.0)
HEMOGLOBIN: 12 g/dL (ref 12.0–15.0)
MCH: 30.4 pg (ref 26.0–34.0)
MCHC: 32.3 g/dL (ref 30.0–36.0)
MCV: 93.9 fL (ref 78.0–100.0)
Platelets: 300 10*3/uL (ref 150–400)
RBC: 3.95 MIL/uL (ref 3.87–5.11)
RDW: 14 % (ref 11.5–15.5)
WBC: 7.3 10*3/uL (ref 4.0–10.5)

## 2014-10-29 LAB — STREP PNEUMONIAE URINARY ANTIGEN: Strep Pneumo Urinary Antigen: NEGATIVE

## 2014-10-29 MED ORDER — POTASSIUM CHLORIDE CRYS ER 20 MEQ PO TBCR
40.0000 meq | EXTENDED_RELEASE_TABLET | Freq: Every day | ORAL | Status: DC
  Administered 2014-10-29 – 2014-10-30 (×2): 40 meq via ORAL
  Filled 2014-10-29 (×2): qty 2

## 2014-10-29 MED ORDER — LISINOPRIL 2.5 MG PO TABS
2.5000 mg | ORAL_TABLET | Freq: Two times a day (BID) | ORAL | Status: DC
  Administered 2014-10-29 – 2014-10-30 (×2): 2.5 mg via ORAL
  Filled 2014-10-29 (×4): qty 1

## 2014-10-29 MED ORDER — DEXTROSE 5 % IV SOLN
1.0000 g | Freq: Two times a day (BID) | INTRAVENOUS | Status: DC
  Administered 2014-10-29: 1 g via INTRAVENOUS
  Filled 2014-10-29 (×2): qty 1

## 2014-10-29 MED ORDER — FUROSEMIDE 10 MG/ML IJ SOLN
40.0000 mg | Freq: Two times a day (BID) | INTRAMUSCULAR | Status: DC
  Administered 2014-10-29 – 2014-10-30 (×3): 40 mg via INTRAVENOUS
  Filled 2014-10-29 (×5): qty 4

## 2014-10-29 MED ORDER — ATORVASTATIN CALCIUM 20 MG PO TABS
20.0000 mg | ORAL_TABLET | Freq: Every day | ORAL | Status: DC
  Administered 2014-10-29: 20 mg via ORAL
  Filled 2014-10-29 (×2): qty 1

## 2014-10-29 NOTE — Consult Note (Signed)
CARDIOLOGY CONSULT NOTE  Patient ID: Janice Daniels MRN: [id number] DOB/AGE: Nov 23, 1945 69 y.o.  Admit date: 10/25/2014 Primary Cardiologist: Luanna Cole Reason for Consultation: CHF  HPI: 69 yo with history of HTN, COPD, small cell lung cancer was admitted with severe shortness of breath and wheezing. She has baseline dyspnea with moderate exertion that has been attributed to COPD.  However, about 4 days prior to admission, she became progressively more short of breath.  She got to the point where she was short of breath at rest.  She was wheezing and coughing.  No fever.  She came to the ER and was admitted for possible COPD exacerbation.  CTA chest showed no PE and no definite PNA.  She was begun on steroids, antibiotics, and nebs for AECOPD.  BNP was noted to be elevated and she had an echo done.  This showed EF 35-40% with diffuse hypokinesis.  She has had a few episodes of chest "cramping" from time to time.  These have been atypical.  She denies prior cardiac history, but she is a smoker with HTN and hyperlipidemia.    She has had some improvement in the hospital but remains prominently short of breath.  +Orthopnea.    Review of systems complete and found to be negative unless listed above in HPI  Past Medical History: 1. Small cell lung cancer: diagnosed 10/12.  S/p radiation and chemotherapy with carboplatin and etoposide. Under surveillance with CTs for now.  2. HTN 3. Hypothyoidism 4. GERD 5. COPD: On Advair and Spiriva at home, active smoker. 6. Depression 7. TAH 8. Hyperlipidemia  Family History  Problem Relation Age of Onset  . Esophageal cancer Father   . Hypothyroidism Father   . Cancer Father     esophageal  . Heart disease Mother   . Hyperlipidemia Mother   . Hypertension Mother     heart disease  . Other Brother     low back pain  . Cancer Brother     esophageal  . Other Brother     hypochondriasis  . Other Brother     suicide  . Esophageal  cancer Brother   . Cervical cancer Sister   . Cancer Sister     cervical    History   Social History  . Marital Status: Married    Spouse Name: [name]  . Number of Children: 5  . Years of Education: N/A   Occupational History  . retired Quarry manager    Social History Main Topics  . Smoking status: Current Every Day Smoker -- 1.00 packs/day for 50 years    Types: Cigarettes    Last Attempt to Quit: 04/27/2011  . Smokeless tobacco: Never Used     Comment: 2nd hand smoke exposure.  . Alcohol Use: No  . Drug Use: No  . Sexual Activity: Not on file   Other Topics Concern  . Not on file   Social History Narrative   She is married to [place] and had 5 sons.  She had a daughter who died at 59 months.  Her son, Heron Sabins, died at age 47 from alcoholism and pancreas disease.  Her son, Merry Proud, is 87 was born 54 and is alive and well.  Her son, Clair Gulling, is 17, born 64, and has bipolar disorder.  Her son, Jenny Reichmann, is 76, born [birth year], and has kidney disease.  Her son, Corene Cornea, is age 31, born [birth year], and is in the [place].  Prescriptions prior to admission  Medication Sig Dispense Refill Last Dose  . albuterol (PROVENTIL) (2.5 MG/3ML) 0.083% nebulizer solution Take 2.5 mg by nebulization 2 (two) times daily.   4 10/25/2014 at am  . albuterol (VENTOLIN HFA) 108 (90 BASE) MCG/ACT inhaler Inhale 2 puffs into the lungs every 6 (six) hours as needed for wheezing or shortness of breath.    10/24/2014 at Unknown time  . citalopram (CELEXA) 10 MG tablet Take 10 mg by mouth daily.     10/23/2014  . Fluticasone-Salmeterol (ADVAIR) 250-50 MCG/DOSE AEPB Inhale 1 puff into the lungs 2 (two) times daily.   10/24/2014 at Unknown time  . gabapentin (NEURONTIN) 600 MG tablet Take 600 mg by mouth at bedtime. Per BJ at Dr. Shon Baton office.   10/24/2014 at Unknown time  . Ibuprofen-Diphenhydramine Cit (ADVIL PM PO) Take 1 tablet by mouth at bedtime.   10/24/2014 at Unknown time  . levothyroxine (SYNTHROID, LEVOTHROID)  112 MCG tablet Take 112 mcg by mouth daily.     several days ago  . loratadine (CLARITIN) 10 MG tablet Take 10 mg by mouth daily as needed for allergies.    unknown  . montelukast (SINGULAIR) 10 MG tablet Take 10 mg by mouth daily.   few days ago  . Multiple Vitamin (MULTIVITAMIN WITH MINERALS) TABS tablet Take 1 tablet by mouth daily. Centrum Silver   10/24/2014 at Unknown time  . OVER THE COUNTER MEDICATION Take 1 tablet by mouth daily as needed (constipation). OTC Stool softener   4-5 days ago  . OVER THE COUNTER MEDICATION Place 1 drop into both eyes daily as needed (itching). Over the counter eye drops   2 weeks ago  . ranitidine (ZANTAC) 150 MG tablet Take 150 mg by mouth daily as needed for heartburn (acid reflux).   unknown  . tiotropium (SPIRIVA) 18 MCG inhalation capsule Place 18 mcg into inhaler and inhale daily.   10/25/2014 at Unknown time  . varenicline (CHANTIX) 1 MG tablet Take 1 mg by mouth daily.   10/25/2014 at Unknown time  . Vitamin D, Ergocalciferol, (DRISDOL) 50000 UNITS CAPS capsule Take 50,000 Units by mouth every 7 (seven) days. Monday   10/19/2014   Current Scheduled Meds: . aspirin EC  81 mg Oral Daily  . atorvastatin  20 mg Oral q1800  . budesonide (PULMICORT) nebulizer solution  0.25 mg Nebulization BID  . ceFEPime (MAXIPIME) IV  1 g Intravenous Q24H  . citalopram  10 mg Oral Daily  . dextromethorphan-guaiFENesin  2 tablet Oral BID  . enoxaparin (LOVENOX) injection  40 mg Subcutaneous Q24H  . famotidine  20 mg Oral BID  . furosemide  40 mg Intravenous BID  . gabapentin  600 mg Oral QHS  . ipratropium-albuterol  3 mL Nebulization Q6H  . levothyroxine  150 mcg Oral QAC breakfast  . lisinopril  2.5 mg Oral BID  . montelukast  10 mg Oral Daily  . potassium chloride  40 mEq Oral Daily  . predniSONE  40 mg Oral Q breakfast  . sodium chloride  3 mL Intravenous Q12H  . vancomycin  500 mg Intravenous Q12H   Continuous Infusions:  PRN Meds:.acetaminophen **OR**  acetaminophen, loratadine, ondansetron **OR** ondansetron (ZOFRAN) IV   Physical exam Blood pressure 127/75, pulse 104, temperature 98 F (36.7 C), temperature source Oral, resp. rate 18, height '5\' 4"'$  (1.626 m), weight 134 lb 0.6 oz (60.8 kg), SpO2 96 %. General: Mild tachypnea Neck: JVP 10-12 cm, no thyromegaly or thyroid nodule.  Lungs: Rhonchi and expiratory wheezes throughout. CV: Nondisplaced PMI.  Heart distant sounds, regular S1/S2, no S3/S4, no murmur.  No peripheral edema.  No carotid bruit.  Normal pedal pulses.  Abdomen: Soft, nontender, no hepatosplenomegaly, no distention.  Skin: Intact without lesions or rashes.  Neurologic: Alert and oriented x 3.  Psych: Normal affect. Extremities: No clubbing or cyanosis.  HEENT: Normal.   Labs:   Lab Results  Component Value Date   WBC 7.3 10/29/2014   HGB 12.0 10/29/2014   HCT 37.1 10/29/2014   MCV 93.9 10/29/2014   PLT 300 10/29/2014    Recent Labs Lab 10/28/14 0257 10/29/14 0619  NA 137 139  K 4.5 3.9  CL 100* 101  CO2 27 31  BUN 18 16  CREATININE 0.95 0.82  CALCIUM 8.6* 8.7*  PROT 6.5  --   BILITOT 0.4  --   ALKPHOS 42  --   ALT 10*  --   AST 17  --   GLUCOSE 132* 132*  BNP 1083  Lab Results  Component Value Date   CKTOTAL 185* 05/31/2008   TROPONINI <0.03 10/26/2014    Lab Results  Component Value Date   CHOL 232* 10/29/2014   CHOL 169 08/12/2010   CHOL 232* 09/07/2009   Lab Results  Component Value Date   HDL 61 10/29/2014   HDL 30* 08/12/2010   HDL 54 09/07/2009   Lab Results  Component Value Date   LDLCALC 151* 10/29/2014   LDLCALC 118* 08/12/2010   LDLCALC 155* 09/07/2009   Lab Results  Component Value Date   TRIG 100 10/29/2014   TRIG 105 08/12/2010   TRIG 115 09/07/2009   Lab Results  Component Value Date   CHOLHDL 3.8 10/29/2014   CHOLHDL 5.6 Ratio 08/12/2010   CHOLHDL 4.3 Ratio 09/07/2009   No results found for: LDLDIRECT    Radiology: - CTA chest: No PE, no definite  PNA  EKG:ST at 118, lateral and anterolateral T wave inversions  ASSESSMENT AND PLAN: 69 yo with history of HTN, COPD, small cell lung cancer was admitted with severe shortness of breath and wheezing.  She is thought to have COPD exacerbation but also had elevated BNP and echo with EF 35-40%.   1. Acute ?on chronic systolic CHF: EF 08-65% with diffuse hypokinesis on echo, BNP 1083 at admission.  Uncertain of etiology of cardiomyopathy.  No prior cardiac history.  She had chemotherapy back in 2012-2013 but had etoposide and carboplatin, which do not commonly cause cardiotoxicity.  She does have multiple RFs for CAD: HTN, hyperlipidemia, active smoking.  She has not had episodes concerning for angina or MI.  Her ECG is abnormal with T wave inversions.  On exam, she does appear volume overloaded with elevated JVP.  I do think that some of her symptoms are related to volume overload.  - Lasix 40 mg IV bid with strict I/Os and daily weight.  Will supplement K.  - Increase lisinopril to 2.5 mg bid.  - No beta blocker while actively wheezing.  Eventually start bisoprolol.  - At some point, will need ischemic workup.  Would probably favor cardiac cath for definitive diagnosis but will wait until respiratory status is better, either as inpatient or output.  2. Hyperlipidemia: LDL very high, start atorvastatin.  3. COPD: Suspect COPD exacerbation, getting steroids, nebs, and abx per primary service.  4. AKI: Resolved.  Will need to watch closely with diuresis.  5. Lung cancer: Small cell lung CA, s/p chemo  and radiation.  She is being followed with surveillance CTs.   Signed: Loralie Champagne 10/29/2014 12:47 PM

## 2014-10-29 NOTE — Progress Notes (Signed)
PATIENT DETAILS Name: Janice Daniels Age: 69 y.o. Sex: female Date of Birth: 09/09/1945 Admit Date: 10/25/2014 Admitting Physician Rise Patience, MD UTM:[email or web address],SFKCLE G, MD  Subjective: Improving slowly  Assessment/Plan: Principal Problem:   Acute respiratory failure with hypoxia:secondary to COPD exacerbation. Initially required BIPAP in the ED, now doing much better with Steroids, nebs, Abx. Currently on room air.   Active Problems:   COPD exacerbation:much better with steroids, nebs and Abx. Now transitioned to prednisone, Lungs with good air entry-some scattered rhonchi.Will taper prednisone over a week from discharge. CT Chest(personally reviewed) showed no NEW infiltrates-will stop Abx 5/5.    Acute systolic XNT:ZGYFVC-BSW sure if wheezing is all cardiac-continue Lasix. Have consulted cards.Follow.    Hypothyroidism:TSH markedly elevated at 10.6 - free T4 low at 0.59 - Synthroid dose increased to 150 mcg. Will need TSH check in around 3 months.    GERD:continue Pepcid    Right upper lobe Lung CA diagnosed October 2012 status post radiation therapy plus chemotherapy:for follow-up scanning in August following 6 month observation period which began in February at time of last Onc office visit    Tobacco abuse:apparently quit 1 week prior to admit-counseled.  Disposition: Remain inpatient-home in 1-2 days  Antimicrobial agents  See below  Anti-infectives    Start     Dose/Rate Route Frequency Ordered Stop   10/29/14 1115  ceFEPIme (MAXIPIME) 1 g in dextrose 5 % 50 mL IVPB     1 g 100 mL/hr over 30 Minutes Intravenous Every 12 hours 10/29/14 1110     10/28/14 0600  vancomycin (VANCOCIN) 500 mg in sodium chloride 0.9 % 100 mL IVPB     500 mg 100 mL/hr over 60 Minutes Intravenous Every 12 hours 10/27/14 1608     10/27/14 2200  levofloxacin (LEVAQUIN) IVPB 750 mg  Status:  Discontinued    Comments:  Levaquin 750 mg IV q48h for CrCl < 50 mL/min     750 mg 100 mL/hr over 90 Minutes Intravenous Every 48 hours 10/26/14 0027 10/26/14 1252   10/27/14 1700  ceFEPIme (MAXIPIME) 1 g in dextrose 5 % 50 mL IVPB  Status:  Discontinued     1 g 100 mL/hr over 30 Minutes Intravenous Every 24 hours 10/27/14 1608 10/29/14 1110   10/27/14 1615  vancomycin (VANCOCIN) IVPB 1000 mg/200 mL premix     1,000 mg 200 mL/hr over 60 Minutes Intravenous  Once 10/27/14 1608 10/27/14 1942   10/27/14 0200  levofloxacin (LEVAQUIN) IVPB 750 mg  Status:  Discontinued    Comments:  Levaquin 750 mg IV q48h for CrCl < 50 mL/min   750 mg 100 mL/hr over 90 Minutes Intravenous Every 24 hours 10/26/14 1252 10/27/14 1535   10/26/14 0100  levofloxacin (LEVAQUIN) IVPB 750 mg     750 mg 100 mL/hr over 90 Minutes Intravenous  Once 10/26/14 0027 10/26/14 0323      DVT Prophylaxis: Prophylactic Lovenox   Code Status: Full code   Family Communication None at bedside  Procedures: None  CONSULTS:  cardiology  Time spent 25 minutes-which includes 50% of the time with face-to-face with patient/ family and coordinating care related to the above assessment and plan.  MEDICATIONS: Scheduled Meds: . aspirin EC  81 mg Oral Daily  . atorvastatin  20 mg Oral q1800  . budesonide (PULMICORT) nebulizer solution  0.25 mg Nebulization BID  . ceFEPime (MAXIPIME) IV  1 g  Intravenous Q12H  . citalopram  10 mg Oral Daily  . dextromethorphan-guaiFENesin  2 tablet Oral BID  . enoxaparin (LOVENOX) injection  40 mg Subcutaneous Q24H  . famotidine  20 mg Oral BID  . furosemide  40 mg Intravenous BID  . gabapentin  600 mg Oral QHS  . ipratropium-albuterol  3 mL Nebulization Q6H  . levothyroxine  150 mcg Oral QAC breakfast  . lisinopril  2.5 mg Oral BID  . montelukast  10 mg Oral Daily  . potassium chloride  40 mEq Oral Daily  . predniSONE  40 mg Oral Q breakfast  . sodium chloride  3 mL Intravenous Q12H  . vancomycin  500 mg Intravenous Q12H   Continuous Infusions:  PRN  Meds:.acetaminophen **OR** acetaminophen, loratadine, ondansetron **OR** ondansetron (ZOFRAN) IV    PHYSICAL EXAM: Vital signs in last 24 hours: Filed Vitals:   10/29/14 0149 10/29/14 0624 10/29/14 0813 10/29/14 1100  BP:  127/75    Pulse: 101 104    Temp:  98 F (36.7 C)    TempSrc:  Oral    Resp: 18 18    Height:      Weight:    60.827 kg (134 lb 1.6 oz)  SpO2: 93% 96% 96%     Weight change:  Filed Weights   10/25/14 2301 10/29/14 1100  Weight: 60.8 kg (134 lb 0.6 oz) 60.827 kg (134 lb 1.6 oz)   Body mass index is 23.01 kg/(m^2).   Gen Exam: Awake and alert with clear speech.   Neck: Supple, No JVD.   Chest: Good air entry-rhonchi in all lung zones CVS: S1 S2 Regular, no murmurs.  Abdomen: soft, BS +, non tender, non distended.  Extremities: no edema, lower extremities warm to touch. Neurologic: Non Focal.   Skin: No Rash.   Wounds: N/A.    Intake/Output from previous day:  Intake/Output Summary (Last 24 hours) at 10/29/14 1235 Last data filed at 10/29/14 1159  Gross per 24 hour  Intake    810 ml  Output   1350 ml  Net   -540 ml     LAB RESULTS: CBC  Recent Labs Lab 10/25/14 1716 10/26/14 0235 10/28/14 0257 10/29/14 0619  WBC 8.6 4.3 10.0 7.3  HGB 12.1 11.4* 11.1* 12.0  HCT 36.7 35.1* 34.1* 37.1  PLT 318 276 287 300  MCV 92.9 93.1 95.0 93.9  MCH 30.6 30.2 30.9 30.4  MCHC 33.0 32.5 32.6 32.3  RDW 13.6 13.7 14.2 14.0  LYMPHSABS 0.9 0.2* 0.4*  --   MONOABS 0.5 0.0* 0.3  --   EOSABS 0.2 0.0 0.0  --   BASOSABS 0.0 0.0 0.0  --     Chemistries   Recent Labs Lab 10/25/14 1716 10/26/14 0235 10/27/14 0309 10/28/14 0257 10/29/14 0619  NA 137 137 137 137 139  K 4.3 4.0 4.4 4.5 3.9  CL 103 102 101 100* 101  CO2 '25 26 27 27 31  '$ GLUCOSE 126* 222* 173* 132* 132*  BUN '10 11 17 18 16  '$ CREATININE 1.04* 0.91 0.95 0.95 0.82  CALCIUM 8.8* 8.4* 8.9 8.6* 8.7*    CBG:  Recent Labs Lab 10/25/14 1734 10/26/14 1534  GLUCAP 137* 207*     GFR Estimated Creatinine Clearance: 55.9 mL/min (by C-G formula based on Cr of 0.82).  Coagulation profile No results for input(s): INR, PROTIME in the last 168 hours.  Cardiac Enzymes  Recent Labs Lab 10/26/14 0235  TROPONINI <0.03    Invalid input(s): POCBNP No  results for input(s): DDIMER in the last 72 hours.  Recent Labs  10/27/14 0309  HGBA1C 6.0*    Recent Labs  10/29/14 0619  CHOL 232*  HDL 61  LDLCALC 151*  TRIG 100  CHOLHDL 3.8   No results for input(s): TSH, T4TOTAL, T3FREE, THYROIDAB in the last 72 hours.  Invalid input(s): FREET3 No results for input(s): VITAMINB12, FOLATE, FERRITIN, TIBC, IRON, RETICCTPCT in the last 72 hours. No results for input(s): LIPASE, AMYLASE in the last 72 hours.  Urine Studies No results for input(s): UHGB, CRYS in the last 72 hours.  Invalid input(s): UACOL, UAPR, USPG, UPH, UTP, UGL, UKET, UBIL, UNIT, UROB, ULEU, UEPI, UWBC, URBC, UBAC, CAST, UCOM, BILUA  MICROBIOLOGY: No results found for this or any previous visit (from the past 240 hour(s)).  RADIOLOGY STUDIES/RESULTS: Ct Angio Chest Pe W/cm &/or Wo Cm  10/25/2014   CLINICAL DATA:  Increase short of breath since Friday. History of right lung cancer.  EXAM: CT ANGIOGRAPHY CHEST WITH CONTRAST  TECHNIQUE: Multidetector CT imaging of the chest was performed using the standard protocol during bolus administration of intravenous contrast. Multiplanar CT image reconstructions and MIPs were obtained to evaluate the vascular anatomy.  CONTRAST:  76m OMNIPAQUE IOHEXOL 350 MG/ML SOLN  COMPARISON:  CT thorax 07/23/2014, PET-CT 10/06/2013  FINDINGS: Mediastinum/Nodes: There are no filling defects within the pulmonary arteries to suggest acute pulmonary embolism. No acute findings aorta great vessels. No pericardial fluid.  There is no axillary supraclavicular adenopathy. There is perihilar masslike thickening on the right which is similar to comparison exam and likely related to  surgery and radiation therapy.  Lungs/Pleura: No new airspace disease. No pleural fluid. No pneumothorax.  Upper abdomen: Limited view of the liver, kidneys, pancreas are unremarkable. Normal adrenal glands.  Musculoskeletal: No aggressive osseous lesion.  Review of the MIP images confirms the above findings.  IMPRESSION: 1. No evidence acute pulmonary embolism. 2. Right perihilar bronchovascular thickening and consolidation not changed from prior exam and consistent with postradiation and postsurgical change.   Electronically Signed   By: SSuzy BouchardM.D.   On: 10/25/2014 21:21   Dg Chest Portable 1 View  10/27/2014   CLINICAL DATA:  Lung cancer, COPD  EXAM: PORTABLE CHEST - 1 VIEW  COMPARISON:  CTA chest dated 10/25/2014  FINDINGS: Right upper lobe/perihilar radiation changes with right apical scarring.  Bibasilar scarring/atelectasis.  Underlying chronic interstitial markings/emphysematous changes.  No pleural effusion or pneumothorax.  Cardiomegaly.  IMPRESSION: Right upper lobe/perihilar radiation changes with right apical scarring.  Bibasilar scarring/ atelectasis.   Electronically Signed   By: SJulian HyM.D.   On: 10/27/2014 10:11   Dg Chest Portable 1 View  10/25/2014   CLINICAL DATA:  Increased shortness of breath, diaphoresis, lung cancer status post radiation therapy  EXAM: PORTABLE CHEST - 1 VIEW  COMPARISON:  CT chest dated 07/23/2014  FINDINGS: Right upper lobe/suprahilar mass, likely reflecting radiation changes with apical pleural parenchymal scarring when correlating with prior CT. Recurrent neoplasm cannot be excluded radiographically.  Chronic interstitial markings/emphysematous changes. No pleural effusion or pneumothorax.  Cardiomegaly.  IMPRESSION: Radiation changes/scarring in the right upper lobe. Underlying neoplasm cannot be excluded radiographically.  No evidence of acute cardiopulmonary disease.   Electronically Signed   By: SJulian HyM.D.   On: 10/25/2014 18:36     GOren Binet MD  [facility] Pager:[phone] If 7PM-7AM, please contact night-coverage [email or web address] Password [email or web address] 10/29/2014, 12:35 PM   LOS: 4 days

## 2014-10-29 NOTE — Evaluation (Signed)
Occupational Therapy Evaluation Patient Details Name: Janice Daniels MRN: [id number] DOB: 09/08/1945 Today's Date: 10/29/2014    History of Present Illness Pt is a 69 y.o. female with PMH of small cell lung cancer, COPD, tobacco abuse, hypothyroidism, admitted 10/25/14 with SOB. CT angiogram of chest was negative for PE or pneumonia.    Clinical Impression   PTA pt lived at home and was independent with ADLs. Pt currently requires min A for functional mobility due to mild unsteadiness. Pt will benefit from acute OT to progress to Supervision level for functional mobility and safety with ADLs. SPO2 remained >93% on RA during activities.      Follow Up Recommendations  No OT follow up    Equipment Recommendations  Tub/shower seat    Recommendations for Other Services       Precautions / Restrictions Precautions Precautions: Fall Restrictions Weight Bearing Restrictions: No      Mobility Bed Mobility               General bed mobility comments: Pt sitting in recliner chair when OT arrived and states she got independently  Transfers Overall transfer level: Needs assistance Equipment used: None Transfers: Sit to/from Stand Sit to Stand: Min guard         General transfer comment: Min guard for safety         ADL Overall ADL's : Needs assistance/impaired Eating/Feeding: Independent;Sitting   Grooming: Min guard;Standing Grooming Details (indicate cue type and reason): pt rested forearms on sink for rest break during grooming tasks.  Upper Body Bathing: Set up;Sitting   Lower Body Bathing: Min guard;Sit to/from stand   Upper Body Dressing : Set up;Sitting   Lower Body Dressing: Min guard;Sit to/from stand   Toilet Transfer: Minimal assistance;Ambulation (hand held assist)   Toileting- Water quality scientist and Hygiene: Min guard;Sit to/from stand       Functional mobility during ADLs: Minimal assistance (hand held assist) General ADL Comments:  Pt requires min A hand held assist to balance during ambulation but requires only min guard to stand.      Vision Additional Comments: No change from baseline          Pertinent Vitals/Pain Pain Assessment: No/denies pain     Hand Dominance Right   Extremity/Trunk Assessment Upper Extremity Assessment Upper Extremity Assessment: Overall WFL for tasks assessed   Lower Extremity Assessment Lower Extremity Assessment: Overall WFL for tasks assessed   Cervical / Trunk Assessment Cervical / Trunk Assessment: Normal   Communication Communication Communication: No difficulties   Cognition Arousal/Alertness: Awake/alert Behavior During Therapy: WFL for tasks assessed/performed Overall Cognitive Status: Within Functional Limits for tasks assessed                                Home Living Family/patient expects to be discharged to:: Private residence Living Arrangements: Spouse/significant other Available Help at Discharge: Family;Available 24 hours/day Type of Home: House Home Access: Stairs to enter [employer] of Steps: 2-3 Entrance Stairs-Rails: Right;Left Home Layout: One level     Bathroom Shower/Tub: Tub/shower unit Shower/tub characteristics: Architectural technologist: Standard     Home Equipment: None;Grab bars - tub/shower          Prior Functioning/Environment Level of Independence: Independent             OT Diagnosis: Generalized weakness;Other (comment) (decreased cardiopulmonary status)   OT Problem List: Decreased strength;Decreased activity tolerance;Impaired balance (sitting and/or standing);Cardiopulmonary  status limiting activity   OT Treatment/Interventions: Self-care/ADL training;Therapeutic exercise;Energy conservation;DME and/or AE instruction;Therapeutic activities;Patient/family education;Balance training    OT Goals(Current goals can be found in the care plan section) Acute Rehab OT Goals Patient Stated Goal:  home tomorrow OT Goal Formulation: With patient Time For Goal Achievement: 11/05/14 Potential to Achieve Goals: Good  OT Frequency: Min 2X/week    End of Session Nurse Communication: Mobility status  Activity Tolerance: Patient tolerated treatment well Patient left: Other (comment);with call bell/phone within reach (sitting EOB)   Time: 2493-2419 OT Time Calculation (min): 16 min Charges:  OT General Charges $OT Visit: 1 Procedure OT Evaluation $Initial OT Evaluation Tier I: 1 Procedure G-Codes:    Juluis Rainier 03-Nov-2014, 1:23 PM  Cyndie Chime, OTR/L Occupational Therapist [phone] (pager)

## 2014-10-29 NOTE — Care Management Note (Signed)
Case Management Note  Patient Details  Name: Janice Daniels MRN: [id number] Date of Birth: 1946-04-20  Subjective/Objective:            NCM spoke with patient, she refused HHPT ,states she does not need it,  She also refused HHRN.  NCM left agency list with patient.          Action/Plan:   Expected Discharge Date:  10/29/14               Expected Discharge Plan:  [place]  In-House Referral:     Discharge planning Services  CM Consult  Post Acute Care Choice:  Home Health Choice offered to:  Patient  DME Arranged:    DME Agency:     HH Arranged:  Patient Refused [place] Agency:     Status of Service:  In process, will continue to follow  Medicare Important Message Given:  Yes Date Medicare IM Given:  10/26/14 Medicare IM give by:  Whitman Hero RN Date Additional Medicare IM Given:  10/29/14 Additional Medicare Important Message give by:  [name] RN  If discussed at Long Length of Stay Meetings, dates discussed:    Additional Comments:  [name], RN 10/29/2014, 12:43 PM

## 2014-10-30 ENCOUNTER — Other Ambulatory Visit: Payer: Self-pay | Admitting: Internal Medicine

## 2014-10-30 LAB — BASIC METABOLIC PANEL
Anion gap: 8 (ref 5–15)
BUN: 16 mg/dL (ref 6–20)
CO2: 33 mmol/L — ABNORMAL HIGH (ref 22–32)
Calcium: 8.6 mg/dL — ABNORMAL LOW (ref 8.9–10.3)
Chloride: 95 mmol/L — ABNORMAL LOW (ref 101–111)
Creatinine, Ser: 0.97 mg/dL (ref 0.44–1.00)
GFR calc Af Amer: >60 mL/min (ref >60)
GFR, EST NON AFRICAN AMERICAN: 58 mL/min — AB (ref >60)
Glucose, Bld: 83 mg/dL (ref 70–99)
Potassium: 3 mmol/L — ABNORMAL LOW (ref 3.5–5.1)
SODIUM: 136 mmol/L (ref 135–145)

## 2014-10-30 MED ORDER — POTASSIUM CHLORIDE CRYS ER 20 MEQ PO TBCR: 40.0000 meq | EXTENDED_RELEASE_TABLET | Freq: Every day | ORAL | Status: DC

## 2014-10-30 MED ORDER — PREDNISONE 10 MG PO TABS: ORAL_TABLET | ORAL | Status: DC

## 2014-10-30 MED ORDER — IPRATROPIUM-ALBUTEROL 0.5-2.5 (3) MG/3ML IN SOLN: 3.0000 mL | Freq: Four times a day (QID) | RESPIRATORY_TRACT | Status: AC

## 2014-10-30 MED ORDER — FUROSEMIDE 40 MG PO TABS
40.0000 mg | ORAL_TABLET | Freq: Every day | ORAL | Status: DC
Start: 2014-10-31 — End: 2014-10-30

## 2014-10-30 MED ORDER — ASPIRIN 81 MG PO TBEC: 81.0000 mg | DELAYED_RELEASE_TABLET | Freq: Every day | ORAL | Status: AC

## 2014-10-30 MED ORDER — SPIRONOLACTONE 25 MG PO TABS: 12.5000 mg | ORAL_TABLET | Freq: Every day | ORAL | Status: DC

## 2014-10-30 MED ORDER — LISINOPRIL 2.5 MG PO TABS: 2.5000 mg | ORAL_TABLET | Freq: Two times a day (BID) | ORAL | Status: DC

## 2014-10-30 MED ORDER — LEVOTHYROXINE SODIUM 125 MCG PO TABS: 125.0000 ug | ORAL_TABLET | Freq: Every day | ORAL | Status: DC

## 2014-10-30 MED ORDER — DM-GUAIFENESIN ER 30-600 MG PO TB12: 2.0000 | ORAL_TABLET | Freq: Two times a day (BID) | ORAL | Status: DC

## 2014-10-30 MED ORDER — ALBUTEROL SULFATE (2.5 MG/3ML) 0.083% IN NEBU: 2.5000 mg | INHALATION_SOLUTION | RESPIRATORY_TRACT | Status: DC | PRN

## 2014-10-30 MED ORDER — POTASSIUM CHLORIDE CRYS ER 20 MEQ PO TBCR
20.0000 meq | EXTENDED_RELEASE_TABLET | Freq: Once | ORAL | Status: AC
  Administered 2014-10-30: 20 meq via ORAL
  Filled 2014-10-30: qty 1

## 2014-10-30 MED ORDER — SPIRONOLACTONE 12.5 MG HALF TABLET
12.5000 mg | ORAL_TABLET | Freq: Every day | ORAL | Status: DC
  Administered 2014-10-30: 12.5 mg via ORAL
  Filled 2014-10-30: qty 1

## 2014-10-30 MED ORDER — FUROSEMIDE 40 MG PO TABS: 40.0000 mg | ORAL_TABLET | Freq: Every day | ORAL | Status: DC

## 2014-10-30 NOTE — Progress Notes (Signed)
Pt received discharge orders,  No iv access, Vitals in stable condition.waiting for ride. .dis 10/30/2014 1:47 PM Kristoph Sattler

## 2014-10-30 NOTE — Progress Notes (Signed)
Patient ID: Janice Daniels, female   DOB: Nov 19, 1945, 69 y.o.   MRN: [id number]   SUBJECTIVE: Patient diuresed very well yesterday. Weight down 3 lbs.  She feels better and sounds better.  Scheduled Meds: . aspirin EC  81 mg Oral Daily  . atorvastatin  20 mg Oral q1800  . budesonide (PULMICORT) nebulizer solution  0.25 mg Nebulization BID  . citalopram  10 mg Oral Daily  . dextromethorphan-guaiFENesin  2 tablet Oral BID  . enoxaparin (LOVENOX) injection  40 mg Subcutaneous Q24H  . famotidine  20 mg Oral BID  . [START ON 10/31/2014] furosemide  40 mg Oral Daily  . gabapentin  600 mg Oral QHS  . ipratropium-albuterol  3 mL Nebulization Q6H  . levothyroxine  150 mcg Oral QAC breakfast  . lisinopril  2.5 mg Oral BID  . montelukast  10 mg Oral Daily  . potassium chloride  40 mEq Oral Daily  . predniSONE  40 mg Oral Q breakfast  . sodium chloride  3 mL Intravenous Q12H  . spironolactone  12.5 mg Oral Daily   Continuous Infusions:  PRN Meds:.acetaminophen **OR** acetaminophen, loratadine, ondansetron **OR** ondansetron (ZOFRAN) IV   Filed Vitals:   10/29/14 1959 10/29/14 2157 10/30/14 0608 10/30/14 0746  BP:  106/68 112/86 117/101  Pulse:  97 98   Temp:  97.7 F (36.5 C) 98.2 F (36.8 C)   TempSrc:  Oral Oral   Resp:  20 18   Height:      Weight:   131 lb 9.6 oz (59.693 kg)   SpO2: 96% 94% 95%     Intake/Output Summary (Last 24 hours) at 10/30/14 0831 Last data filed at 10/30/14 0600  Gross per 24 hour  Intake    290 ml  Output   3200 ml  Net  -2910 ml    LABS: Basic Metabolic Panel:  Recent Labs  10/28/14 0257 10/29/14 0619  NA 137 139  K 4.5 3.9  CL 100* 101  CO2 27 31  GLUCOSE 132* 132*  BUN 18 16  CREATININE 0.95 0.82  CALCIUM 8.6* 8.7*   Liver Function Tests:  Recent Labs  10/28/14 0257  AST 17  ALT 10*  ALKPHOS 42  BILITOT 0.4  PROT 6.5  ALBUMIN 3.4*   No results for input(s): LIPASE, AMYLASE in the last 72 hours. CBC:  Recent Labs  10/28/14 0257 10/29/14 0619  WBC 10.0 7.3  NEUTROABS 9.3*  --   HGB 11.1* 12.0  HCT 34.1* 37.1  MCV 95.0 93.9  PLT 287 300   Cardiac Enzymes: No results for input(s): CKTOTAL, CKMB, CKMBINDEX, TROPONINI in the last 72 hours. BNP: Invalid input(s): POCBNP D-Dimer: No results for input(s): DDIMER in the last 72 hours. Hemoglobin A1C: No results for input(s): HGBA1C in the last 72 hours. Fasting Lipid Panel:  Recent Labs  10/29/14 0619  CHOL 232*  HDL 61  LDLCALC 151*  TRIG 100  CHOLHDL 3.8   Thyroid Function Tests: No results for input(s): TSH, T4TOTAL, T3FREE, THYROIDAB in the last 72 hours.  Invalid input(s): FREET3 Anemia Panel: No results for input(s): VITAMINB12, FOLATE, FERRITIN, TIBC, IRON, RETICCTPCT in the last 72 hours.  RADIOLOGY: Ct Angio Chest Pe W/cm &/or Wo Cm  10/25/2014   CLINICAL DATA:  Increase short of breath since Friday. History of right lung cancer.  EXAM: CT ANGIOGRAPHY CHEST WITH CONTRAST  TECHNIQUE: Multidetector CT imaging of the chest was performed using the standard protocol during bolus administration of intravenous contrast.  Multiplanar CT image reconstructions and MIPs were obtained to evaluate the vascular anatomy.  CONTRAST:  22m OMNIPAQUE IOHEXOL 350 MG/ML SOLN  COMPARISON:  CT thorax 07/23/2014, PET-CT 10/06/2013  FINDINGS: Mediastinum/Nodes: There are no filling defects within the pulmonary arteries to suggest acute pulmonary embolism. No acute findings aorta great vessels. No pericardial fluid.  There is no axillary supraclavicular adenopathy. There is perihilar masslike thickening on the right which is similar to comparison exam and likely related to surgery and radiation therapy.  Lungs/Pleura: No new airspace disease. No pleural fluid. No pneumothorax.  Upper abdomen: Limited view of the liver, kidneys, pancreas are unremarkable. Normal adrenal glands.  Musculoskeletal: No aggressive osseous lesion.  Review of the MIP images confirms the  above findings.  IMPRESSION: 1. No evidence acute pulmonary embolism. 2. Right perihilar bronchovascular thickening and consolidation not changed from prior exam and consistent with postradiation and postsurgical change.   Electronically Signed   By: SSuzy BouchardM.D.   On: 10/25/2014 21:21   Dg Chest Portable 1 View  10/27/2014   CLINICAL DATA:  Lung cancer, COPD  EXAM: PORTABLE CHEST - 1 VIEW  COMPARISON:  CTA chest dated 10/25/2014  FINDINGS: Right upper lobe/perihilar radiation changes with right apical scarring.  Bibasilar scarring/atelectasis.  Underlying chronic interstitial markings/emphysematous changes.  No pleural effusion or pneumothorax.  Cardiomegaly.  IMPRESSION: Right upper lobe/perihilar radiation changes with right apical scarring.  Bibasilar scarring/ atelectasis.   Electronically Signed   By: SJulian HyM.D.   On: 10/27/2014 10:11   Dg Chest Portable 1 View  10/25/2014   CLINICAL DATA:  Increased shortness of breath, diaphoresis, lung cancer status post radiation therapy  EXAM: PORTABLE CHEST - 1 VIEW  COMPARISON:  CT chest dated 07/23/2014  FINDINGS: Right upper lobe/suprahilar mass, likely reflecting radiation changes with apical pleural parenchymal scarring when correlating with prior CT. Recurrent neoplasm cannot be excluded radiographically.  Chronic interstitial markings/emphysematous changes. No pleural effusion or pneumothorax.  Cardiomegaly.  IMPRESSION: Radiation changes/scarring in the right upper lobe. Underlying neoplasm cannot be excluded radiographically.  No evidence of acute cardiopulmonary disease.   Electronically Signed   By: SJulian HyM.D.   On: 10/25/2014 18:36    PHYSICAL EXAM General: NAD Neck: No JVD, no thyromegaly or thyroid nodule.  Lungs: Rhonchi bilaterally CV: Nondisplaced PMI.  Heart regular S1/S2, no S3/S4, no murmur.  No peripheral edema.   Abdomen: Soft, nontender, no hepatosplenomegaly, no distention.  Neurologic: Alert and  oriented x 3.  Psych: Normal affect. Extremities: No clubbing or cyanosis.   TELEMETRY: Reviewed telemetry pt in NSR  ASSESSMENT AND PLAN:  69yo with history of HTN, COPD, small cell lung cancer was admitted with severe shortness of breath and wheezing. She is thought to have COPD exacerbation but also had elevated BNP and echo with EF 35-40%.  1. Acute ?on chronic systolic CHF: EF 376-28%with diffuse hypokinesis on echo, BNP 1083 at admission. Uncertain of etiology of cardiomyopathy. No prior cardiac history. She had chemotherapy back in 2012-2013 but had etoposide and carboplatin, which do not commonly cause cardiotoxicity. She does have multiple RFs for CAD: HTN, hyperlipidemia, active smoking. She has not had episodes concerning for angina or MI. Her ECG is abnormal with T wave inversions. She was diuresed yesterday with IV Lasix with good response, weight down and JVP not elevated today.   - Can change Lasix over to po.  Awaiting BMET today.   - Continue current lisinopril and add spironolactone 12.5 mg  daily.   - No beta blocker yet given COPD exacerbation. Eventually start bisoprolol, likely as outpatient.   - At some point, will need ischemic workup. Would probably favor cardiac cath for definitive diagnosis => I can do Monday if she is still in the hospital, otherwise will arrange as outpatient.  2. Hyperlipidemia: LDL very high, start atorvastatin.  3. COPD: Suspect COPD exacerbation, getting steroids, nebs, and abx per primary service. Improving.  4. AKI: Resolved. Will need to watch closely with diuresis. Awaiting today's BMET.  5. Lung cancer: Small cell lung CA, s/p chemo and radiation. She is being followed with surveillance CTs.  6. Getting close to discharge, I will arrange followup with me in CHF clinic.  Loralie Champagne 10/30/2014 8:34 AM

## 2014-10-30 NOTE — Progress Notes (Signed)
Pt discharge to home with family, transport assisted pt with w/c to family ride. Vitals stable. 10/30/2014 2:54 PM. Robet Leu

## 2014-10-30 NOTE — Discharge Summary (Signed)
PATIENT DETAILS Name: Janice Daniels Age: 69 y.o. Sex: female Date of Birth: Nov 13, 1945 MRN: [id number]. Admitting Physician: Rise Patience, MD GGY:IRSWNIOE,VOJJKK G, MD  Admit Date: 10/25/2014 Discharge date: 10/30/2014  Recommendations for Outpatient Follow-up:  Recheck CBC/BMET in 1 week.   New Medications: Lasix, Lisinopril and Aldacton New Diagnoses:Systolic Heart Failure  PRIMARY DISCHARGE DIAGNOSIS:  Principal Problem:   Acute respiratory failure with hypoxia Active Problems:   Lung cancer   COPD exacerbation   Hypothyroidism   Acute respiratory failure   HCAP (healthcare-associated pneumonia)   Acute renal failure syndrome   Other specified hypothyroidism   Cancer of upper lobe of right lung   Tobacco abuse   Acute on chronic systolic CHF (congestive heart failure)      PAST MEDICAL HISTORY: Past Medical History  Diagnosis Date  . Hypertension   . Hypothyroid   . GERD (gastroesophageal reflux disease)   . COPD (chronic obstructive pulmonary disease)   . Low back pain   . Lung cancer 05/09/2011    RUL  . Depression   . History of radiation therapy 06/05/11 to 07/18/11    lung  . History of radiation therapy 09/13/11 to 09/26/11    brain  . Shingles   . Lung cancer, upper lobe 09/02/2012    rul  . History of chemotherapy     carboplatin and etoposide  . Hx of radiation therapy 10/03/12 - 10/09/12    RUL lung  . Acute on chronic systolic CHF (congestive heart failure)     DISCHARGE MEDICATIONS: Current Discharge Medication List    START taking these medications   Details  aspirin EC 81 MG EC tablet Take 1 tablet (81 mg total) by mouth daily.    dextromethorphan-guaiFENesin (MUCINEX DM) 30-600 MG per 12 hr tablet Take 2 tablets by mouth 2 (two) times daily. Qty: 20 tablet, Refills: 0    furosemide (LASIX) 40 MG tablet Take 1 tablet (40 mg total) by mouth daily. Qty: 30 tablet, Refills: 0    ipratropium-albuterol (DUONEB) 0.5-2.5 (3)  MG/3ML SOLN Take 3 mLs by nebulization every 6 (six) hours. Qty: 360 mL, Refills: 0    lisinopril (PRINIVIL,ZESTRIL) 2.5 MG tablet Take 1 tablet (2.5 mg total) by mouth 2 (two) times daily. Qty: 30 tablet, Refills: 0    potassium chloride SA (K-DUR,KLOR-CON) 20 MEQ tablet Take 2 tablets (40 mEq total) by mouth daily. Qty: 30 tablet, Refills: 0    predniSONE (DELTASONE) 10 MG tablet Take 4 tablets (40 mg) daily for 2 days, then, Take 3 tablets (30 mg) daily for 2 days, then, Take 2 tablets (20 mg) daily for 2 days, then, Take 1 tablets (10 mg) daily for 1 days, then stop Qty: 19 tablet, Refills: 0    spironolactone (ALDACTONE) 25 MG tablet Take 0.5 tablets (12.5 mg total) by mouth daily. Qty: 30 tablet, Refills: 0      CONTINUE these medications which have CHANGED   Details  albuterol (PROVENTIL) (2.5 MG/3ML) 0.083% nebulizer solution Take 3 mLs (2.5 mg total) by nebulization every 2 (two) hours as needed for wheezing or shortness of breath. Qty: 75 mL, Refills: 0   Associated Diagnoses: Malignant neoplasm of right lung, unspecified part of lung    levothyroxine (SYNTHROID, LEVOTHROID) 125 MCG tablet Take 1 tablet (125 mcg total) by mouth daily. Qty: 30 tablet, Refills: 0      CONTINUE these medications which have NOT CHANGED   Details  albuterol (VENTOLIN HFA) 108 (90 BASE) MCG/ACT  inhaler Inhale 2 puffs into the lungs every 6 (six) hours as needed for wheezing or shortness of breath.     citalopram (CELEXA) 10 MG tablet Take 10 mg by mouth daily.      Fluticasone-Salmeterol (ADVAIR) 250-50 MCG/DOSE AEPB Inhale 1 puff into the lungs 2 (two) times daily.    gabapentin (NEURONTIN) 600 MG tablet Take 600 mg by mouth at bedtime. Per [facility] at Dr. Shon Baton office.    loratadine (CLARITIN) 10 MG tablet Take 10 mg by mouth daily as needed for allergies.     montelukast (SINGULAIR) 10 MG tablet Take 10 mg by mouth daily.   Associated Diagnoses: Lung cancer, right    Multiple Vitamin  (MULTIVITAMIN WITH MINERALS) TABS tablet Take 1 tablet by mouth daily. Centrum Silver    !! OVER THE COUNTER MEDICATION Take 1 tablet by mouth daily as needed (constipation). OTC Stool softener    !! OVER THE COUNTER MEDICATION Place 1 drop into both eyes daily as needed (itching). Over the counter eye drops    ranitidine (ZANTAC) 150 MG tablet Take 150 mg by mouth daily as needed for heartburn (acid reflux).    tiotropium (SPIRIVA) 18 MCG inhalation capsule Place 18 mcg into inhaler and inhale daily.    varenicline (CHANTIX) 1 MG tablet Take 1 mg by mouth daily.    Vitamin D, Ergocalciferol, (DRISDOL) 50000 UNITS CAPS capsule Take 50,000 Units by mouth every 7 (seven) days. Monday     !! - Potential duplicate medications found. Please discuss with provider.    STOP taking these medications     Ibuprofen-Diphenhydramine Cit (ADVIL PM PO)         ALLERGIES:   Allergies  Allergen Reactions  . Codeine Other (See Comments)    Elevated pulse    BRIEF HPI:  See H&P, Labs, Consult and Test reports for all details in brief, patient 69 y.o. female with history of small cell lung cancer, COPD, ongoing tobacco abuse, and hypothyroidism who presented with worsening SOB, patient was thought to COPD exacerbation-initially placed on BiPAP-admitted to the SDU.   CONSULTATIONS:   cardiology  PERTINENT RADIOLOGIC STUDIES: Ct Angio Chest Pe W/cm &/or Wo Cm  10/25/2014   CLINICAL DATA:  Increase short of breath since Friday. History of right lung cancer.  EXAM: CT ANGIOGRAPHY CHEST WITH CONTRAST  TECHNIQUE: Multidetector CT imaging of the chest was performed using the standard protocol during bolus administration of intravenous contrast. Multiplanar CT image reconstructions and MIPs were obtained to evaluate the vascular anatomy.  CONTRAST:  35m OMNIPAQUE IOHEXOL 350 MG/ML SOLN  COMPARISON:  CT thorax 07/23/2014, PET-CT 10/06/2013  FINDINGS: Mediastinum/Nodes: There are no filling defects within  the pulmonary arteries to suggest acute pulmonary embolism. No acute findings aorta great vessels. No pericardial fluid.  There is no axillary supraclavicular adenopathy. There is perihilar masslike thickening on the right which is similar to comparison exam and likely related to surgery and radiation therapy.  Lungs/Pleura: No new airspace disease. No pleural fluid. No pneumothorax.  Upper abdomen: Limited view of the liver, kidneys, pancreas are unremarkable. Normal adrenal glands.  Musculoskeletal: No aggressive osseous lesion.  Review of the MIP images confirms the above findings.  IMPRESSION: 1. No evidence acute pulmonary embolism. 2. Right perihilar bronchovascular thickening and consolidation not changed from prior exam and consistent with postradiation and postsurgical change.   Electronically Signed   By: SSuzy BouchardM.D.   On: 10/25/2014 21:21   Dg Chest Portable 1 View  10/27/2014  CLINICAL DATA:  Lung cancer, COPD  EXAM: PORTABLE CHEST - 1 VIEW  COMPARISON:  CTA chest dated 10/25/2014  FINDINGS: Right upper lobe/perihilar radiation changes with right apical scarring.  Bibasilar scarring/atelectasis.  Underlying chronic interstitial markings/emphysematous changes.  No pleural effusion or pneumothorax.  Cardiomegaly.  IMPRESSION: Right upper lobe/perihilar radiation changes with right apical scarring.  Bibasilar scarring/ atelectasis.   Electronically Signed   By: Julian Hy M.D.   On: 10/27/2014 10:11   Dg Chest Portable 1 View  10/25/2014   CLINICAL DATA:  Increased shortness of breath, diaphoresis, lung cancer status post radiation therapy  EXAM: PORTABLE CHEST - 1 VIEW  COMPARISON:  CT chest dated 07/23/2014  FINDINGS: Right upper lobe/suprahilar mass, likely reflecting radiation changes with apical pleural parenchymal scarring when correlating with prior CT. Recurrent neoplasm cannot be excluded radiographically.  Chronic interstitial markings/emphysematous changes. No pleural  effusion or pneumothorax.  Cardiomegaly.  IMPRESSION: Radiation changes/scarring in the right upper lobe. Underlying neoplasm cannot be excluded radiographically.  No evidence of acute cardiopulmonary disease.   Electronically Signed   By: Julian Hy M.D.   On: 10/25/2014 18:36     PERTINENT LAB RESULTS: CBC:  Recent Labs  10/28/14 0257 10/29/14 0619  WBC 10.0 7.3  HGB 11.1* 12.0  HCT 34.1* 37.1  PLT 287 300   CMET CMP     Component Value Date/Time   NA 136 10/30/2014 0802   NA 139 07/23/2014 1354   NA 143 11/17/2011 0847   K 3.0* 10/30/2014 0802   K 3.8 07/23/2014 1354   K 4.6 11/17/2011 0847   CL 95* 10/30/2014 0802   CL 105 12/17/2012 1006   CL 101 11/17/2011 0847   CO2 33* 10/30/2014 0802   CO2 25 07/23/2014 1354   CO2 30 11/17/2011 0847   GLUCOSE 83 10/30/2014 0802   GLUCOSE 100 07/23/2014 1354   GLUCOSE 96 12/17/2012 1006   GLUCOSE 99 11/17/2011 0847   BUN 16 10/30/2014 0802   BUN 6.5* 07/23/2014 1354   BUN 13 11/17/2011 0847   CREATININE 0.97 10/30/2014 0802   CREATININE 0.9 07/23/2014 1354   CREATININE 0.8 11/17/2011 0847   CALCIUM 8.6* 10/30/2014 0802   CALCIUM 9.0 07/23/2014 1354   CALCIUM 9.0 11/17/2011 0847   PROT 6.5 10/28/2014 0257   PROT 7.4 07/23/2014 1354   PROT 7.3 11/17/2011 0847   ALBUMIN 3.4* 10/28/2014 0257   ALBUMIN 4.1 07/23/2014 1354   AST 17 10/28/2014 0257   AST 14 07/23/2014 1354   AST 23 11/17/2011 0847   ALT 10* 10/28/2014 0257   ALT 6 07/23/2014 1354   ALT 17 11/17/2011 0847   ALKPHOS 42 10/28/2014 0257   ALKPHOS 71 07/23/2014 1354   ALKPHOS 60 11/17/2011 0847   BILITOT 0.4 10/28/2014 0257   BILITOT 0.57 07/23/2014 1354   BILITOT 0.60 11/17/2011 0847   GFRNONAA 58* 10/30/2014 0802   GFRAA >60 10/30/2014 0802    GFR Estimated Creatinine Clearance: 47.3 mL/min (by C-G formula based on Cr of 0.97). No results for input(s): LIPASE, AMYLASE in the last 72 hours. No results for input(s): CKTOTAL, CKMB, CKMBINDEX,  TROPONINI in the last 72 hours. Invalid input(s): POCBNP No results for input(s): DDIMER in the last 72 hours. No results for input(s): HGBA1C in the last 72 hours.  Recent Labs  10/29/14 0619  CHOL 232*  HDL 61  LDLCALC 151*  TRIG 100  CHOLHDL 3.8   No results for input(s): TSH, T4TOTAL, T3FREE, THYROIDAB in the  last 72 hours.  Invalid input(s): FREET3 No results for input(s): VITAMINB12, FOLATE, FERRITIN, TIBC, IRON, RETICCTPCT in the last 72 hours. Coags: No results for input(s): INR in the last 72 hours.  Invalid input(s): PT Microbiology: No results found for this or any previous visit (from the past 240 hour(s)).   BRIEF HOSPITAL COURSE:   Principal Problem: Acute respiratory failure with hypoxia:secondary to COPD exacerbation. Initially required BIPAP in the ED, now on room air.Doingg much better after treatment with  Steroids, nebs, Abx.   Active Problems:  COPD exacerbation:significantly improved with solumedrol, nebs and Abx. Now transitioned to prednisone, Lungs with good air entry-some few scattered rhonchi.Patient ambulating in the room without any assistance, feels almost back to usual baseline. Requests discharge today . Will taper prednisone over a week from discharge. CT Chest(personally reviewed) showed no NEW infiltrates-hence Abx were stopped on 5/5.   Acute systolic [facility]:XNATFTDDU on Echo (EF 35-40%) this admit, seen by cards, started on IV Lasix, compensated at time of discharge. Spoke with Dr Kelly Splinter is ok with discharge today, he will arrange outpatient follow up at the CHF clinic. Continue Lasix, Metoprolol, Lisinopril and Aldactone. Will need to check BMET at next visit.Etiology of cardiomyopathy unclear, Cards planning on outpatient work up.   Hypothyroidism:TSH markedly elevated at 10.6 - free T4 low at 0.59 - Synthroid dose increased to 125 mcg. Will need TSH check in around 3 months.   GERD:continue Pepcid   Right upper lobe Lung CA  diagnosed October 2012 status post radiation therapy plus chemotherapy:for follow-up scanning in August following 6 month observation period which began in February at time of last Onc office visit   Tobacco abuse:apparently quit 1 week prior to admit-counseled.  TODAY-DAY OF DISCHARGE:  Subjective:   Janice Daniels today has no headache,no chest abdominal pain,no new weakness tingling or numbness, feels much better wants to go home today.   Objective:   Blood pressure 117/101, pulse 98, temperature 98.2 F (36.8 C), temperature source Oral, resp. rate 18, height '5\' 4"'$  (1.626 m), weight 59.693 kg (131 lb 9.6 oz), SpO2 93 %.  Intake/Output Summary (Last 24 hours) at 10/30/14 0942 Last data filed at 10/30/14 0600  Gross per 24 hour  Intake    290 ml  Output   3200 ml  Net  -2910 ml   Filed Weights   10/25/14 2301 10/29/14 1100 10/30/14 0608  Weight: 60.8 kg (134 lb 0.6 oz) 60.827 kg (134 lb 1.6 oz) 59.693 kg (131 lb 9.6 oz)    Exam Awake Alert, Oriented *3, No new F.N deficits, Normal affect [place].AT,PERRAL Supple Neck,No JVD, No cervical lymphadenopathy appriciated.  Symmetrical Chest wall movement, Good air movement bilaterally, CTAB RRR,No Gallops,Rubs or new Murmurs, No Parasternal Heave +ve B.Sounds, Abd Soft, Non tender, No organomegaly appriciated, No rebound -guarding or rigidity. No Cyanosis, Clubbing or edema, No new Rash or bruise  DISCHARGE CONDITION: Stable  DISPOSITION: Home  DISCHARGE INSTRUCTIONS:    Activity:  As tolerated  Diet recommendation: Heart Healthy diet Fluid restriction 1.5 lit/day  Discharge Instructions    (HEART FAILURE PATIENTS) Call MD:  Anytime you have any of the following symptoms: 1) 3 pound weight gain in 24 hours or 5 pounds in 1 week 2) shortness of breath, with or without a dry hacking cough 3) swelling in the hands, feet or stomach 4) if you have to sleep on extra pillows at night in order to breathe.    Complete by:  As  directed  Diet - low sodium heart healthy    Complete by:  As directed   Fluid Restriction 1500 cc/day     Increase activity slowly    Complete by:  As directed            Follow-up Information    Follow up with Donnajean Lopes, MD.   Specialty:  Internal Medicine   Why:  Office will call patient with an appointment   Contact information:   [place] [address] [phone]       Follow up with Loralie Champagne, MD On 11/06/2014.   Specialty:  Cardiology   Why:  appointment at 10:40 am   Contact information:   [place] [phone]      Total Time spent on discharge equals 45 minutes.  SignedOren Binet 10/30/2014 9:42 AM

## 2014-10-30 NOTE — Progress Notes (Signed)
Physical Therapy Treatment Patient Details Name: Janice Daniels MRN: [id number] DOB: 02-20-1946 Today's Date: 11-11-14    History of Present Illness Pt is a 69 y.o. female with PMH of small cell lung cancer, COPD, tobacco abuse, hypothyroidism, admitted 10/25/14 with SOB. CT angiogram of chest was negative for PE or pneumonia.     PT Comments    Patient doing well with mobility and gait.  Able to negotiate stairs today.  Ready for d/c from PT perspective.  Patient declining HHPT f/u.  Follow Up Recommendations  Home health PT;Supervision for mobility/OOB (patient declining HHPT)     Equipment Recommendations  None recommended by PT    Recommendations for Other Services       Precautions / Restrictions Precautions Precautions: Fall Restrictions Weight Bearing Restrictions: No    Mobility  Bed Mobility                  Transfers Overall transfer level: Modified independent Equipment used: None Transfers: Sit to/from Stand Sit to Stand: Modified independent (Device/Increase time)         General transfer comment: Able to move to standing with no physical assist.  Ambulation/Gait Ambulation/Gait assistance: Min guard Ambulation Distance (Feet): 350 Feet Assistive device: None Gait Pattern/deviations: Step-through pattern;Decreased stride length;Staggering right Gait velocity: slower Gait velocity interpretation: Below normal speed for age/gender General Gait Details: mildly unsteady with drift   Stairs Stairs: Yes Stairs assistance: Supervision Stair Management: One rail Right;Step to pattern;Forwards Number of Stairs: 3 General stair comments: Patient able to negotiate 3 stairs with supervision safely.  Wheelchair Mobility    Modified Rankin (Stroke Patients Only)       Balance                                    Cognition Arousal/Alertness: Awake/alert Behavior During Therapy: WFL for tasks assessed/performed Overall  Cognitive Status: Within Functional Limits for tasks assessed                      Exercises      General Comments        Pertinent Vitals/Pain Pain Assessment: No/denies pain    Home Living                      Prior Function            PT Goals (current goals can now be found in the care plan section) Progress towards PT goals: Goals met/education completed, patient discharged from PT    Frequency  Min 3X/week    PT Plan Current plan remains appropriate    Co-evaluation             End of Session Equipment Utilized During Treatment: Gait belt Activity Tolerance: Patient tolerated treatment well (Dyspnea 2/4) Patient left: in bed;with call bell/phone within reach (sitting EOB)     Time: 0086-7619 PT Time Calculation (min) (ACUTE ONLY): 11 min  Charges:  $Gait Training: 8-22 mins                    G Codes:      Despina Pole 11-11-14, 1:31 PM Carita Pian. Sanjuana Kava, Angelina Pager [phone]

## 2014-11-06 ENCOUNTER — Ambulatory Visit (HOSPITAL_COMMUNITY)
Admit: 2014-11-06 | Discharge: 2014-11-06 | Disposition: A | Discharge status: Home / Self Care | Payer: Commercial Managed Care - HMO | Source: Ambulatory Visit | Attending: Internal Medicine | Admitting: Internal Medicine

## 2014-11-06 ENCOUNTER — Encounter (HOSPITAL_COMMUNITY): Payer: Self-pay

## 2014-11-06 VITALS — BP 124/76 | HR 112 | Wt 131.0 lb

## 2014-11-06 DIAGNOSIS — I5020 Unspecified systolic (congestive) heart failure: Secondary | ICD-10-CM | POA: Diagnosis not present

## 2014-11-06 DIAGNOSIS — I5023 Acute on chronic systolic (congestive) heart failure: Secondary | ICD-10-CM | POA: Diagnosis not present

## 2014-11-06 DIAGNOSIS — I5022 Chronic systolic (congestive) heart failure: Secondary | ICD-10-CM | POA: Diagnosis not present

## 2014-11-06 DIAGNOSIS — Z9221 Personal history of antineoplastic chemotherapy: Secondary | ICD-10-CM | POA: Diagnosis not present

## 2014-11-06 DIAGNOSIS — Z79899 Other long term (current) drug therapy: Secondary | ICD-10-CM | POA: Diagnosis not present

## 2014-11-06 DIAGNOSIS — Z923 Personal history of irradiation: Secondary | ICD-10-CM | POA: Diagnosis not present

## 2014-11-06 DIAGNOSIS — C3490 Malignant neoplasm of unspecified part of unspecified bronchus or lung: Secondary | ICD-10-CM | POA: Diagnosis not present

## 2014-11-06 DIAGNOSIS — I10 Essential (primary) hypertension: Secondary | ICD-10-CM | POA: Diagnosis not present

## 2014-11-06 DIAGNOSIS — Z9181 History of falling: Secondary | ICD-10-CM | POA: Diagnosis not present

## 2014-11-06 DIAGNOSIS — I429 Cardiomyopathy, unspecified: Secondary | ICD-10-CM | POA: Diagnosis not present

## 2014-11-06 DIAGNOSIS — J449 Chronic obstructive pulmonary disease, unspecified: Secondary | ICD-10-CM | POA: Diagnosis not present

## 2014-11-06 DIAGNOSIS — Z7982 Long term (current) use of aspirin: Secondary | ICD-10-CM | POA: Diagnosis not present

## 2014-11-06 DIAGNOSIS — F17210 Nicotine dependence, cigarettes, uncomplicated: Secondary | ICD-10-CM | POA: Insufficient documentation

## 2014-11-06 DIAGNOSIS — E785 Hyperlipidemia, unspecified: Secondary | ICD-10-CM | POA: Diagnosis not present

## 2014-11-06 LAB — BASIC METABOLIC PANEL
Anion gap: 11 (ref 5–15)
BUN: 27 mg/dL — ABNORMAL HIGH (ref 6–20)
CO2: 26 mmol/L (ref 22–32)
Calcium: 8.5 mg/dL — ABNORMAL LOW (ref 8.9–10.3)
Chloride: 93 mmol/L — ABNORMAL LOW (ref 101–111)
Creatinine, Ser: 1.03 mg/dL — ABNORMAL HIGH (ref 0.44–1.00)
GFR calc Af Amer: >60 mL/min (ref >60)
GFR calc non Af Amer: 54 mL/min — ABNORMAL LOW (ref >60)
Glucose, Bld: 106 mg/dL — ABNORMAL HIGH (ref 65–99)
Potassium: 4.4 mmol/L (ref 3.5–5.1)
SODIUM: 130 mmol/L — AB (ref 135–145)

## 2014-11-06 LAB — CBC
HCT: 41.2 % (ref 36.0–46.0)
HEMOGLOBIN: 13.7 g/dL (ref 12.0–15.0)
MCH: 30.3 pg (ref 26.0–34.0)
MCHC: 33.3 g/dL (ref 30.0–36.0)
MCV: 91.2 fL (ref 78.0–100.0)
PLATELETS: 313 10*3/uL (ref 150–400)
RBC: 4.52 MIL/uL (ref 3.87–5.11)
RDW: 13.7 % (ref 11.5–15.5)
WBC: 12.6 10*3/uL — AB (ref 4.0–10.5)

## 2014-11-06 MED ORDER — SIMVASTATIN 20 MG PO TABS: 20.0000 mg | ORAL_TABLET | Freq: Every day | ORAL | Status: DC

## 2014-11-06 NOTE — Progress Notes (Signed)
Patient ID: Janice Daniels, female   DOB: 03-17-1946, 69 y.o.   MRN: [id number]   Patient ID:  Janice Daniels is a 69 yo with history of HTN, COPD, small cell lung cancer (XRT and chemo in 2012) who was admitted last week with COPD exacerbation but also had elevated BNP and echo with EF 35-40% and ECG with TWI (probably LVH with strain).   Subjective:  Says she feels ok since being home. But has over past 2-3 days has had 3-4 falls. Says she was standing on scale and "started going over." Denies feeling lightheaded. Says she hit her head and got a knot on the back of her head. No LOC or other neuro sx. Says she walk around her house but doesn't go much farther. No CP. No edema. Still smoking but denies it. No edema.   Current Outpatient Prescriptions on File Prior to Encounter  Medication Sig Dispense Refill  . albuterol (PROVENTIL) (2.5 MG/3ML) 0.083% nebulizer solution Take 3 mLs (2.5 mg total) by nebulization every 2 (two) hours as needed for wheezing or shortness of breath. 75 mL 0  . albuterol (VENTOLIN HFA) 108 (90 BASE) MCG/ACT inhaler Inhale 2 puffs into the lungs every 6 (six) hours as needed for wheezing or shortness of breath.     Marland Kitchen aspirin EC 81 MG EC tablet Take 1 tablet (81 mg total) by mouth daily.    . citalopram (CELEXA) 10 MG tablet Take 10 mg by mouth daily.      Marland Kitchen dextromethorphan-guaiFENesin (MUCINEX DM) 30-600 MG per 12 hr tablet Take 2 tablets by mouth 2 (two) times daily. 20 tablet 0  . Fluticasone-Salmeterol (ADVAIR) 250-50 MCG/DOSE AEPB Inhale 1 puff into the lungs 2 (two) times daily.    Marland Kitchen gabapentin (NEURONTIN) 600 MG tablet Take 600 mg by mouth at bedtime. Per [facility]J at Dr. Shon Baton office.    Marland Kitchen ipratropium-albuterol (DUONEB) 0.5-2.5 (3) MG/3ML SOLN Take 3 mLs by nebulization every 6 (six) hours. 360 mL 0  . levothyroxine (SYNTHROID, LEVOTHROID) 125 MCG tablet Take 1 tablet (125 mcg total) by mouth daily. 30 tablet 0  . lisinopril (PRINIVIL,ZESTRIL) 2.5 MG tablet  Take 1 tablet (2.5 mg total) by mouth 2 (two) times daily. 30 tablet 0  . loratadine (CLARITIN) 10 MG tablet Take 10 mg by mouth daily as needed for allergies.     . montelukast (SINGULAIR) 10 MG tablet Take 10 mg by mouth daily.    . Multiple Vitamin (MULTIVITAMIN WITH MINERALS) TABS tablet Take 1 tablet by mouth daily. Centrum Silver    . OVER THE COUNTER MEDICATION Take 1 tablet by mouth daily as needed (constipation). OTC Stool softener    . OVER THE COUNTER MEDICATION Place 1 drop into both eyes daily as needed (itching). Over the counter eye drops    . predniSONE (DELTASONE) 10 MG tablet Take 4 tablets (40 mg) daily for 2 days, then, Take 3 tablets (30 mg) daily for 2 days, then, Take 2 tablets (20 mg) daily for 2 days, then, Take 1 tablets (10 mg) daily for 1 days, then stop 19 tablet 0  . ranitidine (ZANTAC) 150 MG tablet Take 150 mg by mouth daily as needed for heartburn (acid reflux).    Marland Kitchen spironolactone (ALDACTONE) 25 MG tablet Take 0.5 tablets (12.5 mg total) by mouth daily. 30 tablet 0  . tiotropium (SPIRIVA) 18 MCG inhalation capsule Place 18 mcg into inhaler and inhale daily.    . varenicline (CHANTIX) 1 MG tablet Take 1  mg by mouth daily.    . Vitamin D, Ergocalciferol, (DRISDOL) 50000 UNITS CAPS capsule Take 50,000 Units by mouth every 7 (seven) days. Monday    . potassium chloride SA (K-DUR,KLOR-CON) 20 MEQ tablet Take 2 tablets (40 mEq total) by mouth daily. (Patient not taking: Reported on 11/06/2014) 30 tablet 0   No current facility-administered medications on file prior to encounter.      Filed Vitals:   11/06/14 1041  BP: 124/76  Pulse: 112  Weight: 131 lb (59.421 kg)  SpO2: 96%   No intake or output data in the 24 hours ending 11/06/14 1131  LABS: Basic Metabolic Panel: No results for input(s): NA, K, CL, CO2, GLUCOSE, BUN, CREATININE, CALCIUM, MG, PHOS in the last 72 hours. Liver Function Tests: No results for input(s): AST, ALT, ALKPHOS, BILITOT, PROT,  ALBUMIN in the last 72 hours. No results for input(s): LIPASE, AMYLASE in the last 72 hours. CBC: No results for input(s): WBC, NEUTROABS, HGB, HCT, MCV, PLT in the last 72 hours. Cardiac Enzymes: No results for input(s): CKTOTAL, CKMB, CKMBINDEX, TROPONINI in the last 72 hours. BNP: Invalid input(s): POCBNP D-Dimer: No results for input(s): DDIMER in the last 72 hours. Hemoglobin A1C: No results for input(s): HGBA1C in the last 72 hours. Fasting Lipid Panel: No results for input(s): CHOL, HDL, LDLCALC, TRIG, CHOLHDL, LDLDIRECT in the last 72 hours. Thyroid Function Tests: No results for input(s): TSH, T4TOTAL, T3FREE, THYROIDAB in the last 72 hours.  Invalid input(s): FREET3 Anemia Panel: No results for input(s): VITAMINB12, FOLATE, FERRITIN, TIBC, IRON, RETICCTPCT in the last 72 hours.  RADIOLOGY: Ct Angio Chest Pe W/cm &/or Wo Cm  10/25/2014   CLINICAL DATA:  Increase short of breath since Friday. History of right lung cancer.  EXAM: CT ANGIOGRAPHY CHEST WITH CONTRAST  TECHNIQUE: Multidetector CT imaging of the chest was performed using the standard protocol during bolus administration of intravenous contrast. Multiplanar CT image reconstructions and MIPs were obtained to evaluate the vascular anatomy.  CONTRAST:  36m OMNIPAQUE IOHEXOL 350 MG/ML SOLN  COMPARISON:  CT thorax 07/23/2014, PET-CT 10/06/2013  FINDINGS: Mediastinum/Nodes: There are no filling defects within the pulmonary arteries to suggest acute pulmonary embolism. No acute findings aorta great vessels. No pericardial fluid.  There is no axillary supraclavicular adenopathy. There is perihilar masslike thickening on the right which is similar to comparison exam and likely related to surgery and radiation therapy.  Lungs/Pleura: No new airspace disease. No pleural fluid. No pneumothorax.  Upper abdomen: Limited view of the liver, kidneys, pancreas are unremarkable. Normal adrenal glands.  Musculoskeletal: No aggressive osseous  lesion.  Review of the MIP images confirms the above findings.  IMPRESSION: 1. No evidence acute pulmonary embolism. 2. Right perihilar bronchovascular thickening and consolidation not changed from prior exam and consistent with postradiation and postsurgical change.   Electronically Signed   By: SSuzy BouchardM.D.   On: 10/25/2014 21:21   Dg Chest Portable 1 View  10/27/2014   CLINICAL DATA:  Lung cancer, COPD  EXAM: PORTABLE CHEST - 1 VIEW  COMPARISON:  CTA chest dated 10/25/2014  FINDINGS: Right upper lobe/perihilar radiation changes with right apical scarring.  Bibasilar scarring/atelectasis.  Underlying chronic interstitial markings/emphysematous changes.  No pleural effusion or pneumothorax.  Cardiomegaly.  IMPRESSION: Right upper lobe/perihilar radiation changes with right apical scarring.  Bibasilar scarring/ atelectasis.   Electronically Signed   By: SJulian HyM.D.   On: 10/27/2014 10:11   Dg Chest Portable 1 View  10/25/2014  CLINICAL DATA:  Increased shortness of breath, diaphoresis, lung cancer status post radiation therapy  EXAM: PORTABLE CHEST - 1 VIEW  COMPARISON:  CT chest dated 07/23/2014  FINDINGS: Right upper lobe/suprahilar mass, likely reflecting radiation changes with apical pleural parenchymal scarring when correlating with prior CT. Recurrent neoplasm cannot be excluded radiographically.  Chronic interstitial markings/emphysematous changes. No pleural effusion or pneumothorax.  Cardiomegaly.  IMPRESSION: Radiation changes/scarring in the right upper lobe. Underlying neoplasm cannot be excluded radiographically.  No evidence of acute cardiopulmonary disease.   Electronically Signed   By: Julian Hy M.D.   On: 10/25/2014 18:36   SBP 110 -> 88 (standing)  PHYSICAL EXAM General: Elderly. Frail appearing. SOB at rest. Smells like cigarette smoke Neck: No JVD, no thyromegaly or thyroid nodule.  Lungs: markedly decreased BS throughout no wheeze CV: Distant Heart  regular S1/S2, no S3/S4, no murmur.  No peripheral edema.   Abdomen: Soft, nontender, no hepatosplenomegaly, no distention.  Neurologic: Alert and oriented x 3.  Psych: Normal affect. Extremities: No clubbing or cyanosis.   Sat 95% on RA. Don't drop with hall walk  ECG: Since with LVH and strain  ASSESSMENT AND PLAN:  69 yo with history of HTN, COPD, small cell lung cancer recentlyadmitted with COPD exacerbation but also had elevated BNP and echo with EF 35-40%.  1.Recently diagnosed systolic HF:: EF 85-88% with diffuse hypokinesis on echoUncertain of etiology of cardiomyopathy. No prior cardiac history. She had chemotherapy back in 2012-2013 but had etoposide and carboplatin, which do not commonly cause cardiotoxicity. She does have multiple RFs for CAD: HTN, hyperlipidemia, active smoking. She has not had episodes concerning for angina or MI.  - She is dry today and orthostatic with recent falls. Will stop lasix completely. Continue spiro. Encouraged po intake today - Continue lisinopril and spiro. Can change to losartan if needed - No beta blocker yet given severe COPD. Eventually can consider bisoprolol -Ideally would recommend cath to further assess cardiomyopathy but she is not CABG candidate and has no angina to warrant PCI so will manage medically. Continue ASA and add simva 20. Can consider lexiscan if develops ischemic symptoms or EF worse - Repeat echo 2 months 2. Hyperlipidemia: LDL very high. Started atorva in hospital but no longer taking. Will try simva. 3. COPD: Recent flare. Now on prednisone. Counseled on need to stop smoking. She needs closer f/u with Pulmonary. 4. Lung cancer: Small cell lung CA, s/p chemo and radiation. She is being followed with surveillance CTs.   HF not major issue here. Will refer to [facility] for ongoing care.   Glori Bickers MD 11/06/2014 11:31 AM

## 2014-11-06 NOTE — Progress Notes (Signed)
Orthostatics Sitting SBP 111 Standing SBP 88  Patient c/o slight dizziness Dr. Haroldine Laws in clinic and made aware.  Janice Daniels

## 2014-11-06 NOTE — Patient Instructions (Addendum)
STOP Lasix.  START Simvastatin '20mg'$  (1 tablet) daily.  FOLLOW UP with Pulmonary in 1 week.  Your provider Referred you [facility]. (appt 3-4 weeks)  LABS today bmet and cbc.

## 2014-11-11 ENCOUNTER — Other Ambulatory Visit: Payer: Self-pay | Admitting: Internal Medicine

## 2014-11-13 ENCOUNTER — Inpatient Hospital Stay: Payer: Commercial Managed Care - HMO | Admitting: Adult Health

## 2014-11-26 ENCOUNTER — Other Ambulatory Visit: Payer: Self-pay | Admitting: Internal Medicine

## 2014-12-04 ENCOUNTER — Other Ambulatory Visit: Payer: Self-pay | Admitting: Internal Medicine

## 2014-12-09 ENCOUNTER — Encounter: Payer: Self-pay | Admitting: Cardiology

## 2014-12-09 ENCOUNTER — Ambulatory Visit (INDEPENDENT_AMBULATORY_CARE_PROVIDER_SITE_OTHER): Payer: Commercial Managed Care - HMO | Admitting: Cardiology

## 2014-12-09 ENCOUNTER — Encounter (INDEPENDENT_AMBULATORY_CARE_PROVIDER_SITE_OTHER): Payer: Commercial Managed Care - HMO

## 2014-12-09 VITALS — BP 124/72 | HR 127 | Ht 64.0 in | Wt 123.6 lb

## 2014-12-09 DIAGNOSIS — R0602 Shortness of breath: Secondary | ICD-10-CM

## 2014-12-09 DIAGNOSIS — I5023 Acute on chronic systolic (congestive) heart failure: Secondary | ICD-10-CM | POA: Diagnosis not present

## 2014-12-09 DIAGNOSIS — I5022 Chronic systolic (congestive) heart failure: Secondary | ICD-10-CM | POA: Diagnosis not present

## 2014-12-09 DIAGNOSIS — R5383 Other fatigue: Secondary | ICD-10-CM

## 2014-12-09 DIAGNOSIS — J9601 Acute respiratory failure with hypoxia: Secondary | ICD-10-CM

## 2014-12-09 DIAGNOSIS — J441 Chronic obstructive pulmonary disease with (acute) exacerbation: Secondary | ICD-10-CM

## 2014-12-09 NOTE — Patient Instructions (Addendum)
Your physician recommends that you schedule a follow-up appointment in: 2 Weeks with Dr Percival Spanish or APP  Your physician has recommended that you wear a holter monitor. Holter monitors are medical devices that record the heart's electrical activity. Doctors most often use these monitors to diagnose arrhythmias. Arrhythmias are problems with the speed or rhythm of the heartbeat. The monitor is a small, portable device. You can wear one while you do your normal daily activities. This is usually used to diagnose what is causing palpitations/syncope (passing out).  You have been referred to Simonne Maffucci, Pulmonologist  Your physician recommends that you return for lab work CBC and BMP

## 2014-12-09 NOTE — Progress Notes (Signed)
Cardiology Office Note   Date:  12/09/2014   ID:  Janice Daniels, DOB 08-14-1945, MRN [id number]  PCP:  Donnajean Lopes, MD  Cardiologist:   Minus Breeding, MD   Chief Complaint  Patient presents with  . Shortness of Breath      History of Present Illness: Janice Daniels is a 69 y.o. female who presents for evaluation of shortness of breath.  She has a complicated medical history including treatment for small cell lung cancer as well as COPD. 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 However, it was not felt that time that her primary issue was related to her heart failure.  He had previously been decided that since she was not having any ischemic episodes and would not be a surgical candidate and had no acute ongoing symptoms that she would be managed conservatively. There was no plan for catheterization for stress perfusion study.  On reviewing her recent records I do note that her sodium was low when it was checked last month. Presenting today for follow-up she looks to be short of breath. However, she says this is her baseline. Her resting oxygen that duration on room air was 93%. She has a chronic cough productive of a white sputum but this is not changed from previous. She's not describing PND or orthopnea. She's not describing chest pressure, neck or arm discomfort. She's had no weight gain or edema.  She actually has not seen a pulmonologist in a while. She does say that she's had still some decrease in her gait or balance. She was falling although she states she has fallen in about a month.   Past Medical History  Diagnosis Date  . Hypertension   . Hypothyroid   . GERD (gastroesophageal reflux disease)   . COPD (chronic obstructive pulmonary disease)   . Low back pain   . Lung cancer 05/09/2011    RUL  . Depression   . History of radiation therapy 06/05/11 to 07/18/11    lung  . History of radiation therapy  09/13/11 to 09/26/11    brain  . Shingles   . Lung cancer, upper lobe 09/02/2012    rul  . History of chemotherapy     carboplatin and etoposide  . Hx of radiation therapy 10/03/12 - 10/09/12    RUL lung  . Acute on chronic systolic CHF (congestive heart failure)     Past Surgical History  Procedure Laterality Date  . Tubal ligation  1984  . Total abdominal hysterectomy  1986  . Tonsillectomy    . Endobronchial biopsy Right 05/03/2011     Current Outpatient Prescriptions  Medication Sig Dispense Refill  . albuterol (PROVENTIL) (2.5 MG/3ML) 0.083% nebulizer solution Take 3 mLs (2.5 mg total) by nebulization every 2 (two) hours as needed for wheezing or shortness of breath. 75 mL 0  . albuterol (VENTOLIN HFA) 108 (90 BASE) MCG/ACT inhaler Inhale 2 puffs into the lungs every 6 (six) hours as needed for wheezing or shortness of breath.     Marland Kitchen aspirin EC 81 MG EC tablet Take 1 tablet (81 mg total) by mouth daily.    . citalopram (CELEXA) 10 MG tablet Take 10 mg by mouth daily.      Marland Kitchen dextromethorphan-guaiFENesin (MUCINEX DM) 30-600 MG per 12 hr tablet Take 2 tablets by mouth 2 (two) times daily. 20 tablet 0  . Fluticasone-Salmeterol (ADVAIR) 250-50 MCG/DOSE AEPB Inhale 1 puff into  the lungs 2 (two) times daily.    Marland Kitchen gabapentin (NEURONTIN) 600 MG tablet Take 600 mg by mouth at bedtime. Per BJ at Dr. Shon Baton office.    Marland Kitchen ipratropium-albuterol (DUONEB) 0.5-2.5 (3) MG/3ML SOLN Take 3 mLs by nebulization every 6 (six) hours. 360 mL 0  . levothyroxine (SYNTHROID, LEVOTHROID) 125 MCG tablet Take 1 tablet (125 mcg total) by mouth daily. 30 tablet 0  . lisinopril (PRINIVIL,ZESTRIL) 2.5 MG tablet Take 1 tablet (2.5 mg total) by mouth 2 (two) times daily. 30 tablet 0  . loratadine (CLARITIN) 10 MG tablet Take 10 mg by mouth daily as needed for allergies.     . montelukast (SINGULAIR) 10 MG tablet Take 10 mg by mouth daily.    . Multiple Vitamin (MULTIVITAMIN WITH MINERALS) TABS tablet Take 1 tablet  by mouth daily. Centrum Silver    . OVER THE COUNTER MEDICATION Take 1 tablet by mouth daily as needed (constipation). OTC Stool softener    . OVER THE COUNTER MEDICATION Place 1 drop into both eyes daily as needed (itching). Over the counter eye drops    . simvastatin (ZOCOR) 20 MG tablet Take 1 tablet (20 mg total) by mouth daily. 30 tablet 3  . spironolactone (ALDACTONE) 25 MG tablet Take 0.5 tablets (12.5 mg total) by mouth daily. 30 tablet 0  . tiotropium (SPIRIVA) 18 MCG inhalation capsule Place 18 mcg into inhaler and inhale daily.    . varenicline (CHANTIX) 1 MG tablet Take 1 mg by mouth daily.    . Vitamin D, Ergocalciferol, (DRISDOL) 50000 UNITS CAPS capsule Take 50,000 Units by mouth every 7 (seven) days. Monday     No current facility-administered medications for this visit.    Allergies:   Codeine     ROS:  Please see the history of present illness.   Otherwise, review of systems are positive for none.   All other systems are reviewed and negative.    PHYSICAL EXAM: VS:  BP 124/72 mmHg  Pulse 127  Ht '5\' 4"'$  (1.626 m)  Wt 123 lb 9.6 oz (56.065 kg)  BMI 21.21 kg/m2  SpO2 92% , BMI Body mass index is 21.21 kg/(m^2). GENERAL:  Well appearing HEENT:  Pupils equal round and reactive, fundi not visualized, oral mucosa unremarkable NECK:  No jugular venous distention, waveform within normal limits, carotid upstroke brisk and symmetric, no bruits, no thyromegaly LYMPHATICS:  No cervical, inguinal adenopathy LUNGS:  Clear to auscultation bilaterally BACK:  No CVA tenderness CHEST:  Unremarkable HEART:  PMI not displaced or sustained,S1 and S2 within normal limits, no S3, no S4, no clicks, no rubs, no murmurs ABD:  Flat, positive bowel sounds normal in frequency in pitch, no bruits, no rebound, no guarding, no midline pulsatile mass, no hepatomegaly, no splenomegaly EXT:  2 plus pulses throughout, no edema, no cyanosis no clubbing SKIN:  No rashes no nodules NEURO:  Cranial  nerves II through XII grossly intact, motor grossly intact throughout PSYCH:  Cognitively intact, oriented to person place and time    EKG:  EKG is ordered today. The ekg ordered today demonstrates sinus rhythm, rate 127, axis within normal limits, left atrial enlargement, premature ectopic complex, no acute ST-T wave changes.   Recent Labs: 10/25/2014: B Natriuretic Peptide 1083.8* 10/26/2014: TSH 10.647* 10/28/2014: ALT 10* 11/06/2014: BUN 27*; Creatinine, Ser 1.03*; Hemoglobin 13.7; Platelets 313; Potassium 4.4; Sodium 130*    Lipid Panel    Component Value Date/Time   CHOL 232* 10/29/2014 0619   TRIG  100 10/29/2014 0619   HDL 61 10/29/2014 0619   CHOLHDL 3.8 10/29/2014 0619   VLDL 20 10/29/2014 0619   LDLCALC 151* 10/29/2014 0619      Wt Readings from Last 3 Encounters:  12/09/14 123 lb 9.6 oz (56.065 kg)  11/06/14 131 lb (59.421 kg)  10/30/14 131 lb 9.6 oz (59.693 kg)      Other studies Reviewed: Additional studies/ records that were reviewed today include:  Previous labs. Review of the above records demonstrates:  Please see elsewhere in the note.     ASSESSMENT AND PLAN:  DYPSPNEA: Shortness multifactorial and some degree related to chronic lung disease. She needs to be established with a new pulmonologist but she's not seen once in a while. At this point I don't think she has overt volume overload and given her recent increased BUN thought that she was mildly dehydrated I am not going to start a diaphoretic or titrate her ACE inhibitor at this point. We can consider going up on these in the future.  CARDIOMYOPATHY:  Again the etiology of this has not been established. I agree with conservative management at this point although I will consider stress perfusion imaging in the future.  WEAKNESS FALLS: I will repeat his sodium level to make sure this is not fitting. I think she would need to follow again with her primary provider and oncologist. She has had brain radiation  prophylactically.    TACHYCARDIA:  This seems to be increased compared with baseline but it is sinus.  I will apply a 24 hour Holter to asses further.  I think her dyspnea is driving the tachycardia but want to make sure that the heart rate goes down appropriately at rest.     Current medicines are reviewed at length with the patient today.  The patient does not have concerns regarding medicines.  The following changes have been made:  no change  Labs/ tests ordered today include:   Orders Placed This Encounter  Procedures  . Basic Metabolic Panel (BMET)  . CBC  . Holter monitor - 24 hour  . EKG 12-Lead     Disposition:   FU with me or APP in two weeks.     Signed, Minus Breeding, MD  12/09/2014 11:28 AM    [place]

## 2014-12-10 ENCOUNTER — Other Ambulatory Visit: Payer: Self-pay | Admitting: Cardiology

## 2014-12-10 LAB — CBC
HCT: 43.3 % (ref 36.0–46.0)
Hemoglobin: 14.1 g/dL (ref 12.0–15.0)
MCH: 29.9 pg (ref 26.0–34.0)
MCHC: 32.6 g/dL (ref 30.0–36.0)
MCV: 91.9 fL (ref 78.0–100.0)
MPV: 9.5 fL (ref 8.6–12.4)
PLATELETS: 331 10*3/uL (ref 150–400)
RBC: 4.71 MIL/uL (ref 3.87–5.11)
RDW: 13.8 % (ref 11.5–15.5)
WBC: 8.8 10*3/uL (ref 4.0–10.5)

## 2014-12-10 LAB — BASIC METABOLIC PANEL
BUN: 11 mg/dL (ref 6–23)
CALCIUM: 10 mg/dL (ref 8.4–10.5)
CO2: 19 mEq/L (ref 19–32)
Chloride: 103 mEq/L (ref 96–112)
Creat: 0.95 mg/dL (ref 0.50–1.10)
GLUCOSE: 108 mg/dL — AB (ref 70–99)
Potassium: 4.7 mEq/L (ref 3.5–5.3)
Sodium: 140 mEq/L (ref 135–145)

## 2014-12-10 MED ORDER — SPIRONOLACTONE 25 MG PO TABS: 12.5000 mg | ORAL_TABLET | Freq: Every day | ORAL | Status: DC

## 2014-12-10 NOTE — Addendum Note (Signed)
Addended by: Vennie Homans on: 12/10/2014 11:35 AM   Modules accepted: Orders

## 2014-12-10 NOTE — Telephone Encounter (Signed)
Rx(s) sent to pharmacy electronically.  

## 2014-12-10 NOTE — Telephone Encounter (Signed)
°  1. Which medications need to be refilled? Spironolactone  2. Which pharmacy is medication to be sent to?[facility]-[phone]  3. Do they need a 30 day or 90 day supply? 90 and refills  4. Would they like a call back once the medication has been sent to the pharmacy? yes

## 2014-12-10 NOTE — Addendum Note (Signed)
Addended by: Diana Eves on: 12/10/2014 04:42 PM   Modules accepted: Orders

## 2014-12-20 ENCOUNTER — Other Ambulatory Visit: Payer: Self-pay | Admitting: Internal Medicine

## 2014-12-23 ENCOUNTER — Encounter: Payer: Self-pay | Admitting: Pulmonary Disease

## 2014-12-23 ENCOUNTER — Other Ambulatory Visit: Payer: Self-pay | Admitting: *Deleted

## 2014-12-23 ENCOUNTER — Ambulatory Visit (INDEPENDENT_AMBULATORY_CARE_PROVIDER_SITE_OTHER): Payer: Commercial Managed Care - HMO | Admitting: Pulmonary Disease

## 2014-12-23 VITALS — BP 128/78 | HR 121 | Ht 64.0 in | Wt 125.0 lb

## 2014-12-23 DIAGNOSIS — C3401 Malignant neoplasm of right main bronchus: Secondary | ICD-10-CM

## 2014-12-23 DIAGNOSIS — I5022 Chronic systolic (congestive) heart failure: Secondary | ICD-10-CM | POA: Diagnosis not present

## 2014-12-23 DIAGNOSIS — J432 Centrilobular emphysema: Secondary | ICD-10-CM | POA: Diagnosis not present

## 2014-12-23 MED ORDER — SPIRONOLACTONE 25 MG PO TABS: 12.5000 mg | ORAL_TABLET | Freq: Every day | ORAL | Status: DC

## 2014-12-23 NOTE — Assessment & Plan Note (Signed)
Appears euvolemic on exam today. Has a cardiac cath planned.  Plan:   continue follow-up with cardiology

## 2014-12-23 NOTE — Assessment & Plan Note (Signed)
She had a recent exacerbation of her COPD but since quitting smoking she says her breathing has improved significantly. She is not wheezing on exam today with the exception of a focal area in the right upper lobe which is likely related to her prior radiation therapy.  I recommended that she have repeat pulmonary function testing but she refused. She says that because she's feeling better she does not thinks that she needs it.  Plan: Follow-up 3 months Continue Advair and Spiriva for now, although we probably consolidate these to [facility] if she is still doing well on the next visit Ambulatory oximetry today

## 2014-12-23 NOTE — Patient Instructions (Signed)
Stay away from cigarettes no matter what Keep taking Advair and Spiriva Stay active, exercise regularly Keep your appointment with cardiology We will see you back in 3 months or sooner if needed

## 2014-12-23 NOTE — Assessment & Plan Note (Signed)
She had stage IIIa non-small cell lung cancer diagnosed in 2012. Treated with chemotherapy and radiation therapy.  I personally reviewed the images from her chest x-ray from the recent hospitalization there appears to be no evidence of recurrence.  Plan: Continue follow-up with oncology

## 2014-12-23 NOTE — Progress Notes (Signed)
Subjective:    Patient ID: Janice Daniels, female    DOB: [birth year], 69 y.o.   MRN: [id number]  Synopsis: former [facility] patient with COPD and a history of lung cancer. PFT's 04/2011:  FEV1 1.57 (77%), ratio 47%, ++airtrapping, DLCO normal Referred to pulmonary rehab 03/2012 >> decided not to go.  Stage IIIa [place] lung cancer diagnosed 2012 treated with chemo and radiation, last treated in 2013. Quit smoking in 2016 after starting at age 33. Smoked 1ppd.  HPI Chief Complaint  Patient presents with  . Follow-up    old [facility] pt last seen 03/2013.  pt recently hospitalized for sob.      She was recently hosptialized for shortness of breath related to a COPD exacerbation and anew diagnosis of systolic heart failure. She is feeling much better now. Still has cough with mucus production which is yellow. She has had intermittent chest pain which was sharp, mid to left chest which lasted only a few seconds while lying flat.  Has not come back since. She is continuing to take her inhalers at home (Advair, Spiriva) without difficulty. She quit smoking about a month ago.     Past Medical History  Diagnosis Date  . Hypertension   . Hypothyroid   . GERD (gastroesophageal reflux disease)   . COPD (chronic obstructive pulmonary disease)   . Low back pain   . Lung cancer 05/09/2011    RUL  . Depression   . History of radiation therapy 06/05/11 to 07/18/11    lung  . History of radiation therapy 09/13/11 to 09/26/11    brain  . Shingles   . Lung cancer, upper lobe 09/02/2012    rul  . History of chemotherapy     carboplatin and etoposide  . Hx of radiation therapy 10/03/12 - 10/09/12    RUL lung  . Acute on chronic systolic CHF (congestive heart failure)       Review of Systems  Constitutional: Negative for fever, diaphoresis and fatigue.  HENT: Negative for postnasal drip, rhinorrhea and sinus pressure.   Respiratory: Positive for cough. Negative for shortness of breath and wheezing.     Cardiovascular: Positive for chest pain. Negative for palpitations and leg swelling.       Objective:   Physical Exam  Filed Vitals:   12/23/14 1045  BP: 128/78  Pulse: 121  Height: '5\' 4"'$  (1.626 m)  Weight: 125 lb (56.7 kg)  SpO2: 94%   RA  Gen: well appearing, no acute distress HENT: NCAT, OP clear, neck supple without masses Eyes: PERRL, EOMi Lymph: no cervical lymphadenopathy PULM: Focal wheeze RUL, otherwise clear with good air movement CV: RRR, no mgr, no JVD GI: BS+, soft, nontender, no hsm Derm: no rash or skin breakdown MSK: normal bulk and tone Neuro: A&Ox4, CN II-XII intact, strength 5/5 in all 4 extremities Psyche: normal mood and affect        Assessment & Plan:  COPD (chronic obstructive pulmonary disease) with emphysema She had a recent exacerbation of her COPD but since quitting smoking she says her breathing has improved significantly. She is not wheezing on exam today with the exception of a focal area in the right upper lobe which is likely related to her prior radiation therapy.  I recommended that she have repeat pulmonary function testing but she refused. She says that because she's feeling better she does not thinks that she needs it.  Plan: Follow-up 3 months Continue Advair and Spiriva for now, although  we probably consolidate these to [facility] if she is still doing well on the next visit Ambulatory oximetry today  Lung cancer She had stage IIIa non-small cell lung cancer diagnosed in 2012. Treated with chemotherapy and radiation therapy.  I personally reviewed the images from her chest x-ray from the recent hospitalization there appears to be no evidence of recurrence.  Plan: Continue follow-up with oncology  Chronic systolic heart failure Appears euvolemic on exam today. Has a cardiac cath planned.  Plan:   continue follow-up with cardiology     Current outpatient prescriptions:  .  albuterol (PROVENTIL) (2.5 MG/3ML) 0.083%  nebulizer solution, Take 3 mLs (2.5 mg total) by nebulization every 2 (two) hours as needed for wheezing or shortness of breath., Disp: 75 mL, Rfl: 0 .  albuterol (VENTOLIN HFA) 108 (90 BASE) MCG/ACT inhaler, Inhale 2 puffs into the lungs every 6 (six) hours as needed for wheezing or shortness of breath. , Disp: , Rfl:  .  aspirin EC 81 MG EC tablet, Take 1 tablet (81 mg total) by mouth daily., Disp: , Rfl:  .  citalopram (CELEXA) 10 MG tablet, Take 10 mg by mouth daily.  , Disp: , Rfl:  .  dextromethorphan-guaiFENesin (MUCINEX DM) 30-600 MG per 12 hr tablet, Take 2 tablets by mouth 2 (two) times daily., Disp: 20 tablet, Rfl: 0 .  Fluticasone-Salmeterol (ADVAIR) 250-50 MCG/DOSE AEPB, Inhale 1 puff into the lungs 2 (two) times daily., Disp: , Rfl:  .  gabapentin (NEURONTIN) 600 MG tablet, Take 600 mg by mouth at bedtime. Per [facility]J at Dr. Shon Baton office., Disp: , Rfl:  .  ipratropium-albuterol (DUONEB) 0.5-2.5 (3) MG/3ML SOLN, Take 3 mLs by nebulization every 6 (six) hours., Disp: 360 mL, Rfl: 0 .  levothyroxine (SYNTHROID, LEVOTHROID) 125 MCG tablet, Take 1 tablet (125 mcg total) by mouth daily., Disp: 30 tablet, Rfl: 0 .  lisinopril (PRINIVIL,ZESTRIL) 2.5 MG tablet, Take 1 tablet (2.5 mg total) by mouth 2 (two) times daily., Disp: 30 tablet, Rfl: 0 .  loratadine (CLARITIN) 10 MG tablet, Take 10 mg by mouth daily as needed for allergies. , Disp: , Rfl:  .  montelukast (SINGULAIR) 10 MG tablet, Take 10 mg by mouth daily., Disp: , Rfl:  .  Multiple Vitamin (MULTIVITAMIN WITH MINERALS) TABS tablet, Take 1 tablet by mouth daily. Centrum Silver, Disp: , Rfl:  .  OVER THE COUNTER MEDICATION, Take 1 tablet by mouth daily as needed (constipation). OTC Stool softener, Disp: , Rfl:  .  OVER THE COUNTER MEDICATION, Place 1 drop into both eyes daily as needed (itching). Over the counter eye drops, Disp: , Rfl:  .  simvastatin (ZOCOR) 20 MG tablet, Take 1 tablet (20 mg total) by mouth daily., Disp: 30 tablet, Rfl:  3 .  spironolactone (ALDACTONE) 25 MG tablet, Take 0.5 tablets (12.5 mg total) by mouth daily., Disp: 45 tablet, Rfl: 3 .  tiotropium (SPIRIVA) 18 MCG inhalation capsule, Place 18 mcg into inhaler and inhale daily., Disp: , Rfl:  .  varenicline (CHANTIX) 1 MG tablet, Take 1 mg by mouth daily., Disp: , Rfl:  .  Vitamin D, Ergocalciferol, (DRISDOL) 50000 UNITS CAPS capsule, Take 50,000 Units by mouth every 7 (seven) days. Monday, Disp: , Rfl:

## 2014-12-31 ENCOUNTER — Ambulatory Visit (INDEPENDENT_AMBULATORY_CARE_PROVIDER_SITE_OTHER): Payer: Commercial Managed Care - HMO | Admitting: Cardiology

## 2014-12-31 ENCOUNTER — Ambulatory Visit: Payer: Commercial Managed Care - HMO | Admitting: Cardiology

## 2014-12-31 ENCOUNTER — Encounter: Payer: Self-pay | Admitting: Cardiology

## 2014-12-31 VITALS — BP 104/60 | HR 100 | Ht 64.0 in | Wt 124.7 lb

## 2014-12-31 DIAGNOSIS — I10 Essential (primary) hypertension: Secondary | ICD-10-CM

## 2014-12-31 DIAGNOSIS — I5022 Chronic systolic (congestive) heart failure: Secondary | ICD-10-CM

## 2014-12-31 NOTE — Patient Instructions (Signed)
Your physician wants you to follow-up in: 1 Year. You will receive a reminder letter in the mail two months in advance. If you don't receive a letter, please call our office to schedule the follow-up appointment.  

## 2014-12-31 NOTE — Progress Notes (Signed)
Cardiology Office Note   Date:  12/31/2014   ID:  Janice Daniels, DOB [birth year], MRN [id number]  PCP:  Janice Lopes, MD  Cardiologist:   Minus Breeding, MD   Chief Complaint  Patient presents with  . Chest Pain      History of Present Illness: Janice Daniels is a 69 y.o. female who presents for evaluation of shortness of breath.  She has a complicated medical history including treatment for small cell lung cancer as well as COPD. She does have a dilated cardiomyopathy and was previously seen in the [facility]. However, it was not felt that time that her primary issue was related to her heart failure.  Because of the severity of her lung problems an ischemia workup was not undertaken. When I saw her for the first time she did have tachycardia and low sodium. However, follow-up labs demonstrated this was improved. She has seen a pulmonologist and has primary function testing scheduled.   She is actually breathing better. She stopped smoking. She still having some falls and sounds like her balance is off.  Past Medical History  Diagnosis Date  . Hypertension   . Hypothyroid   . GERD (gastroesophageal reflux disease)   . COPD (chronic obstructive pulmonary disease)   . Low back pain   . Lung cancer 05/09/2011    RUL  . Depression   . History of radiation therapy 06/05/11 to 07/18/11    lung  . History of radiation therapy 09/13/11 to 09/26/11    brain  . Shingles   . Lung cancer, upper lobe 09/02/2012    rul  . History of chemotherapy     carboplatin and etoposide  . Hx of radiation therapy 10/03/12 - 10/09/12    RUL lung  . Acute on chronic systolic CHF (congestive heart failure)     Past Surgical History  Procedure Laterality Date  . Tubal ligation  1984  . Total abdominal hysterectomy  1986  . Tonsillectomy    . Endobronchial biopsy Right 05/03/2011     Current Outpatient Prescriptions  Medication Sig Dispense Refill  . albuterol (PROVENTIL) (2.5 MG/3ML)  0.083% nebulizer solution Take 3 mLs (2.5 mg total) by nebulization every 2 (two) hours as needed for wheezing or shortness of breath. 75 mL 0  . albuterol (VENTOLIN HFA) 108 (90 BASE) MCG/ACT inhaler Inhale 2 puffs into the lungs every 6 (six) hours as needed for wheezing or shortness of breath.     Marland Kitchen aspirin EC 81 MG EC tablet Take 1 tablet (81 mg total) by mouth daily.    . citalopram (CELEXA) 10 MG tablet Take 10 mg by mouth daily.      Marland Kitchen dextromethorphan-guaiFENesin (MUCINEX DM) 30-600 MG per 12 hr tablet Take 2 tablets by mouth 2 (two) times daily. 20 tablet 0  . Fluticasone-Salmeterol (ADVAIR) 250-50 MCG/DOSE AEPB Inhale 1 puff into the lungs 2 (two) times daily.    Marland Kitchen gabapentin (NEURONTIN) 600 MG tablet Take 600 mg by mouth at bedtime. Per BJ at Dr. Shon Baton office.    Marland Kitchen ipratropium-albuterol (DUONEB) 0.5-2.5 (3) MG/3ML SOLN Take 3 mLs by nebulization every 6 (six) hours. 360 mL 0  . levothyroxine (SYNTHROID, LEVOTHROID) 125 MCG tablet Take 1 tablet (125 mcg total) by mouth daily. 30 tablet 0  . lisinopril (PRINIVIL,ZESTRIL) 2.5 MG tablet Take 1 tablet (2.5 mg total) by mouth 2 (two) times daily. 30 tablet 0  . loratadine (CLARITIN) 10 MG tablet Take 10 mg by mouth  daily as needed for allergies.     . montelukast (SINGULAIR) 10 MG tablet Take 10 mg by mouth daily.    . Multiple Vitamin (MULTIVITAMIN WITH MINERALS) TABS tablet Take 1 tablet by mouth daily. Centrum Silver    . OVER THE COUNTER MEDICATION Take 1 tablet by mouth daily as needed (constipation). OTC Stool softener    . OVER THE COUNTER MEDICATION Place 1 drop into both eyes daily as needed (itching). Over the counter eye drops    . simvastatin (ZOCOR) 20 MG tablet Take 1 tablet (20 mg total) by mouth daily. 30 tablet 3  . spironolactone (ALDACTONE) 25 MG tablet Take 0.5 tablets (12.5 mg total) by mouth daily. 45 tablet 3  . tiotropium (SPIRIVA) 18 MCG inhalation capsule Place 18 mcg into inhaler and inhale daily.    .  varenicline (CHANTIX) 1 MG tablet Take 1 mg by mouth daily.    . Vitamin D, Ergocalciferol, (DRISDOL) 50000 UNITS CAPS capsule Take 50,000 Units by mouth every 7 (seven) days. Monday     No current facility-administered medications for this visit.    Allergies:   Codeine     ROS:  Please see the history of present illness.   Otherwise, review of systems are positive for none.   All other systems are reviewed and negative.    PHYSICAL EXAM: VS:  There were no vitals taken for this visit. , BMI There is no weight on file to calculate BMI. GENERAL:  Well appearing NECK:  No jugular venous distention, waveform within normal limits, carotid upstroke brisk and symmetric, no bruits, no thyromegaly LUNGS:  Clear to auscultation bilaterally BACK:  No CVA tenderness CHEST:  Unremarkable HEART:  PMI not displaced or sustained,S1 and S2 within normal limits, no S3, no S4, no clicks, no rubs, no murmurs ABD:  Flat, positive bowel sounds normal in frequency in pitch, no bruits, no rebound, no guarding, no midline pulsatile mass, no hepatomegaly, no splenomegaly EXT:  2 plus pulses throughout, no edema, no cyanosis no clubbing   EKG:  EKG is not ordered today.   Recent Labs: 10/25/2014: B Natriuretic Peptide 1083.8* 10/26/2014: TSH 10.647* 10/28/2014: ALT 10* 12/09/2014: BUN 11; Creat 0.95; Hemoglobin 14.1; Platelets 331; Potassium 4.7; Sodium 140    Lipid Panel    Component Value Date/Time   CHOL 232* 10/29/2014 0619   TRIG 100 10/29/2014 0619   HDL 61 10/29/2014 0619   CHOLHDL 3.8 10/29/2014 0619   VLDL 20 10/29/2014 0619   LDLCALC 151* 10/29/2014 0619      Wt Readings from Last 3 Encounters:  12/23/14 125 lb (56.7 kg)  12/09/14 123 lb 9.6 oz (56.065 kg)  11/06/14 131 lb (59.421 kg)      Other studies Reviewed: Additional studies/ records that were reviewed today include:  None    ASSESSMENT AND PLAN:  DYPSPNEA: Shortness multifactorial and some degree related to chronic lung  disease. At this point no change to her heart failure medicines as indicated. I think this is primarily pulmonary.  CARDIOMYOPATHY:  Again the etiology of this has not been established. I agree with conservative management at this point although I will consider stress perfusion imaging in the future.  WEAKNESS FALLS: This may be related to previous radiation of her brain. Her sodium was normal at the last visit. She does not seem to be having any orthostasis. No change in therapy is indicated.  TACHYCARDIA:  I did apply a 24-hour Holter which demonstrated that her heart rate  goes down with rest. I do not suspect a primary dysrhythmia. No change in therapy is indicated. No further monitoring is planned.   Current medicines are reviewed at length with the patient today.  The patient does not have concerns regarding medicines.  The following changes have been made:  no change  Labs/ tests ordered today include:   No orders of the defined types were placed in this encounter.     Disposition:   FU with me or APP    Signed, Minus Breeding, MD  12/31/2014 7:37 AM    [facility]

## 2015-01-25 ENCOUNTER — Other Ambulatory Visit (HOSPITAL_BASED_OUTPATIENT_CLINIC_OR_DEPARTMENT_OTHER): Payer: Commercial Managed Care - HMO

## 2015-01-25 ENCOUNTER — Ambulatory Visit (HOSPITAL_COMMUNITY)
Admission: RE | Admit: 2015-01-25 | Discharge: 2015-01-25 | Disposition: A | Discharge status: Home / Self Care | Payer: Commercial Managed Care - HMO | Source: Ambulatory Visit | Attending: Internal Medicine | Admitting: Internal Medicine

## 2015-01-25 DIAGNOSIS — Z85118 Personal history of other malignant neoplasm of bronchus and lung: Secondary | ICD-10-CM

## 2015-01-25 DIAGNOSIS — Z923 Personal history of irradiation: Secondary | ICD-10-CM | POA: Diagnosis not present

## 2015-01-25 DIAGNOSIS — J432 Centrilobular emphysema: Secondary | ICD-10-CM | POA: Diagnosis not present

## 2015-01-25 DIAGNOSIS — C3491 Malignant neoplasm of unspecified part of right bronchus or lung: Secondary | ICD-10-CM | POA: Insufficient documentation

## 2015-01-25 LAB — CBC WITH DIFFERENTIAL/PLATELET
BASO%: 0.6 % (ref 0.0–2.0)
Basophils Absolute: 0 10*3/uL (ref 0.0–0.1)
EOS%: 1.5 % (ref 0.0–7.0)
Eosinophils Absolute: 0.1 10*3/uL (ref 0.0–0.5)
HEMATOCRIT: 39.1 % (ref 34.8–46.6)
HEMOGLOBIN: 13.1 g/dL (ref 11.6–15.9)
LYMPH#: 0.6 10*3/uL — AB (ref 0.9–3.3)
LYMPH%: 12.9 % — AB (ref 14.0–49.7)
MCH: 30.5 pg (ref 25.1–34.0)
MCHC: 33.4 g/dL (ref 31.5–36.0)
MCV: 91.2 fL (ref 79.5–101.0)
MONO#: 0.7 10*3/uL (ref 0.1–0.9)
MONO%: 13.1 % (ref 0.0–14.0)
NEUT#: 3.6 10*3/uL (ref 1.5–6.5)
NEUT%: 71.9 % (ref 38.4–76.8)
PLATELETS: 281 10*3/uL (ref 145–400)
RBC: 4.29 10*6/uL (ref 3.70–5.45)
RDW: 13.9 % (ref 11.2–14.5)
WBC: 5 10*3/uL (ref 3.9–10.3)

## 2015-01-25 LAB — COMPREHENSIVE METABOLIC PANEL (CC13)
ALT: 8 U/L (ref 0–55)
AST: 15 U/L (ref 5–34)
Albumin: 3.6 g/dL (ref 3.5–5.0)
Alkaline Phosphatase: 57 U/L (ref 40–150)
Anion Gap: 7 mEq/L (ref 3–11)
BILIRUBIN TOTAL: 0.51 mg/dL (ref 0.20–1.20)
BUN: 5.8 mg/dL — AB (ref 7.0–26.0)
CALCIUM: 9.3 mg/dL (ref 8.4–10.4)
CO2: 27 mEq/L (ref 22–29)
CREATININE: 0.8 mg/dL (ref 0.6–1.1)
Chloride: 107 mEq/L (ref 98–109)
EGFR: 75 mL/min/{1.73_m2} — AB (ref >90)
GLUCOSE: 95 mg/dL (ref 70–140)
Potassium: 4.3 mEq/L (ref 3.5–5.1)
Sodium: 141 mEq/L (ref 136–145)
Total Protein: 6.6 g/dL (ref 6.4–8.3)

## 2015-01-25 MED ORDER — IOHEXOL 300 MG/ML  SOLN
100.0000 mL | Freq: Once | INTRAMUSCULAR | Status: AC | PRN
  Administered 2015-01-25: 80 mL via INTRAVENOUS

## 2015-02-01 ENCOUNTER — Telehealth: Payer: Self-pay | Admitting: Medical Oncology

## 2015-02-01 ENCOUNTER — Other Ambulatory Visit: Payer: Self-pay | Admitting: Medical Oncology

## 2015-02-01 ENCOUNTER — Ambulatory Visit (HOSPITAL_COMMUNITY)
Admission: RE | Admit: 2015-02-01 | Discharge: 2015-02-01 | Disposition: A | Discharge status: Home / Self Care | Payer: Commercial Managed Care - HMO | Source: Ambulatory Visit | Attending: Internal Medicine | Admitting: Internal Medicine

## 2015-02-01 ENCOUNTER — Ambulatory Visit (HOSPITAL_BASED_OUTPATIENT_CLINIC_OR_DEPARTMENT_OTHER): Payer: Commercial Managed Care - HMO | Admitting: Internal Medicine

## 2015-02-01 ENCOUNTER — Encounter: Payer: Self-pay | Admitting: Internal Medicine

## 2015-02-01 ENCOUNTER — Telehealth: Payer: Self-pay | Admitting: Internal Medicine

## 2015-02-01 VITALS — BP 121/88 | HR 120 | Temp 98.8°F | Resp 20 | Ht 64.0 in | Wt 125.8 lb

## 2015-02-01 DIAGNOSIS — C3411 Malignant neoplasm of upper lobe, right bronchus or lung: Secondary | ICD-10-CM

## 2015-02-01 DIAGNOSIS — Z85118 Personal history of other malignant neoplasm of bronchus and lung: Secondary | ICD-10-CM

## 2015-02-01 DIAGNOSIS — G319 Degenerative disease of nervous system, unspecified: Secondary | ICD-10-CM | POA: Diagnosis not present

## 2015-02-01 DIAGNOSIS — C3490 Malignant neoplasm of unspecified part of unspecified bronchus or lung: Secondary | ICD-10-CM | POA: Insufficient documentation

## 2015-02-01 DIAGNOSIS — Z923 Personal history of irradiation: Secondary | ICD-10-CM | POA: Insufficient documentation

## 2015-02-01 DIAGNOSIS — R9089 Other abnormal findings on diagnostic imaging of central nervous system: Secondary | ICD-10-CM

## 2015-02-01 DIAGNOSIS — Z9181 History of falling: Secondary | ICD-10-CM | POA: Diagnosis not present

## 2015-02-01 DIAGNOSIS — R930 Abnormal findings on diagnostic imaging of skull and head, not elsewhere classified: Secondary | ICD-10-CM | POA: Diagnosis not present

## 2015-02-01 MED ORDER — GADOBENATE DIMEGLUMINE 529 MG/ML IV SOLN
15.0000 mL | Freq: Once | INTRAVENOUS | Status: AC | PRN
  Administered 2015-02-01: 11 mL via INTRAVENOUS

## 2015-02-01 NOTE — Progress Notes (Signed)
Pt unsteady on feet . Taken to scheduler with one man assist and assisted to wheelchair. MRI scheduled for 1 pm today , I called her husband and informed him of appt and he is coming to pick her up now  . Pt taken outside and assisted to bench to wait for husband . Valet staff instructed not to let pt get up unassisted.

## 2015-02-01 NOTE — Telephone Encounter (Signed)
Faxed MRI report to Dr Philip Aspen.

## 2015-02-01 NOTE — Telephone Encounter (Signed)
Pt confirmed labs/ov per 08/08 POF, gave pt AVS and Calendar..... KJ

## 2015-02-01 NOTE — Telephone Encounter (Signed)
Pt in lobby back from MRI . Per Julien Nordmann I shared MRI results with pt and walked with her to outside bench . " I am fine when I am sitting and had no problem driving here. " She ambulated independently to [place] and is alert and oriented. She would not let her husband take her home.

## 2015-02-01 NOTE — Progress Notes (Signed)
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   PRIOR THERAPY:  1) Status post palliative radiotherapy to the right hilum under the care Janice Daniels.  2) Systemic chemotherapy with carboplatin for AUC of 5 on day 1 and etoposide 120 mg/M2 on days 1,2 and 3 with Neulasta support on day 4, status post 6 cycles.  3) Prophylactic cranial irradiation under the care of Janice Daniels.  4) Curative stereotactic radiotherapy to right lung nodule suspicious for a stage IA non-small cell lung cancer under the care of Janice Daniels completed on 10/09/2012.   CURRENT THERAPY: Observation.  INTERVAL HISTORY: Janice Daniels 69 y.o. female returns to the clinic today for six-month followup visit. The patient is feeling fine today with no specific complaints except frequent falls started recently. She was diagnosed with congestive heart failure in June 2016 and was started on Aldactone, lisinopril as well as Zocor by Dr. Percival Spanish. The patient mentions that her falls started after her admission in June 2016 She denied having any significant weight loss or night sweats. She has no chest pain, shortness of breath with exertion, cough or hemoptysis. She has no nausea or vomiting, no fever or chills. She had repeat CT scan of the chest performed recently and she is here for evaluation and discussion of her scan results.  MEDICAL HISTORY: Past Medical History  Diagnosis Date  . Hypertension   . Hypothyroid   . GERD (gastroesophageal reflux disease)   . COPD (chronic obstructive pulmonary disease)   . Low back pain   . Lung cancer 05/09/2011    RUL  . Depression   . History of radiation therapy 06/05/11 to 07/18/11    lung  . History of radiation therapy 09/13/11 to 09/26/11    brain  . Shingles   . Lung cancer,  upper lobe 09/02/2012    rul  . History of chemotherapy     carboplatin and etoposide  . Hx of radiation therapy 10/03/12 - 10/09/12    RUL lung  . Acute on chronic systolic CHF (congestive heart failure)     ALLERGIES:  is allergic to codeine.  MEDICATIONS:  Current Outpatient Prescriptions  Medication Sig Dispense Refill  . albuterol (PROVENTIL) (2.5 MG/3ML) 0.083% nebulizer solution Take 3 mLs (2.5 mg total) by nebulization every 2 (two) hours as needed for wheezing or shortness of breath. 75 mL 0  . albuterol (VENTOLIN HFA) 108 (90 BASE) MCG/ACT inhaler Inhale 2 puffs into the lungs every 6 (six) hours as needed for wheezing or shortness of breath.     Marland Kitchen aspirin EC 81 MG EC tablet Take 1 tablet (81 mg total) by mouth daily.    . citalopram (CELEXA) 10 MG tablet Take 10 mg by mouth daily.      Marland Kitchen dextromethorphan-guaiFENesin (MUCINEX DM) 30-600 MG per 12 hr tablet Take 2 tablets by mouth 2 (two) times daily. 20 tablet 0  . Fluticasone-Salmeterol (ADVAIR) 250-50 MCG/DOSE AEPB Inhale 1 puff into the lungs 2 (two) times daily.    Marland Kitchen gabapentin (NEURONTIN) 600 MG tablet Take 600 mg by mouth at bedtime. Per [facility] at Dr. Shon Baton office.    Marland Kitchen ipratropium-albuterol (DUONEB) 0.5-2.5 (3) MG/3ML SOLN Take 3 mLs by nebulization every 6 (six) hours. 360 mL 0  . levothyroxine (SYNTHROID, LEVOTHROID) 125 MCG  tablet Take 1 tablet (125 mcg total) by mouth daily. 30 tablet 0  . lisinopril (PRINIVIL,ZESTRIL) 2.5 MG tablet Take 1 tablet (2.5 mg total) by mouth 2 (two) times daily. 30 tablet 0  . loratadine (CLARITIN) 10 MG tablet Take 10 mg by mouth daily as needed for allergies.     . montelukast (SINGULAIR) 10 MG tablet Take 10 mg by mouth daily.    . Multiple Vitamin (MULTIVITAMIN WITH MINERALS) TABS tablet Take 1 tablet by mouth daily. Centrum Silver    . OVER THE COUNTER MEDICATION Take 1 tablet by mouth daily as needed (constipation). OTC Stool softener    . OVER THE COUNTER MEDICATION Place 1 drop into  both eyes daily as needed (itching). Over the counter eye drops    . simvastatin (ZOCOR) 20 MG tablet Take 1 tablet (20 mg total) by mouth daily. 30 tablet 3  . spironolactone (ALDACTONE) 25 MG tablet Take 0.5 tablets (12.5 mg total) by mouth daily. 45 tablet 3  . tiotropium (SPIRIVA) 18 MCG inhalation capsule Place 18 mcg into inhaler and inhale daily.    . varenicline (CHANTIX) 1 MG tablet Take 1 mg by mouth daily.    . Vitamin D, Ergocalciferol, (DRISDOL) 50000 UNITS CAPS capsule Take 50,000 Units by mouth every 7 (seven) days. Monday     No current facility-administered medications for this visit.    SURGICAL HISTORY:  Past Surgical History  Procedure Laterality Date  . Tubal ligation  1984  . Total abdominal hysterectomy  1986  . Tonsillectomy    . Endobronchial biopsy Right 05/03/2011    REVIEW OF SYSTEMS:  A comprehensive review of systems was negative except for: Constitutional: positive for fatigue Neurological: positive for coordination problems, weakness and Frequent falls   PHYSICAL EXAMINATION: General appearance: alert, cooperative and no distress Head: Normocephalic, without obvious abnormality, atraumatic Neck: no adenopathy, no JVD, supple, symmetrical, trachea midline and thyroid not enlarged, symmetric, no tenderness/mass/nodules Lymph nodes: Cervical, supraclavicular, and axillary nodes normal. Resp: clear to auscultation bilaterally Back: symmetric, no curvature. ROM normal. No CVA tenderness. Cardio: regular rate and rhythm, S1, S2 normal, no murmur, click, rub or gallop GI: soft, non-tender; bowel sounds normal; no masses,  no organomegaly Extremities: extremities normal, atraumatic, no cyanosis or edema  ECOG PERFORMANCE STATUS: 1 - Symptomatic but completely ambulatory  Blood pressure 121/88, pulse 120, temperature 98.8 F (37.1 C), temperature source Oral, resp. rate 20, height '5\' 4"'$  (1.626 m), weight 125 lb 12.8 oz (57.063 kg), SpO2 98 %.  LABORATORY  DATA: Lab Results  Component Value Date   WBC 5.0 01/25/2015   HGB 13.1 01/25/2015   HCT 39.1 01/25/2015   MCV 91.2 01/25/2015   PLT 281 01/25/2015      Chemistry      Component Value Date/Time   NA 141 01/25/2015 1005   NA 140 12/09/2014 1125   NA 143 11/17/2011 0847   K 4.3 01/25/2015 1005   K 4.7 12/09/2014 1125   K 4.6 11/17/2011 0847   CL 103 12/09/2014 1125   CL 105 12/17/2012 1006   CL 101 11/17/2011 0847   CO2 27 01/25/2015 1005   CO2 19 12/09/2014 1125   CO2 30 11/17/2011 0847   BUN 5.8* 01/25/2015 1005   BUN 11 12/09/2014 1125   BUN 13 11/17/2011 0847   CREATININE 0.8 01/25/2015 1005   CREATININE 0.95 12/09/2014 1125   CREATININE 1.03* 11/06/2014 1200      Component Value Date/Time   CALCIUM 9.3  01/25/2015 1005   CALCIUM 10.0 12/09/2014 1125   CALCIUM 9.0 11/17/2011 0847   ALKPHOS 57 01/25/2015 1005   ALKPHOS 42 10/28/2014 0257   ALKPHOS 60 11/17/2011 0847   AST 15 01/25/2015 1005   AST 17 10/28/2014 0257   AST 23 11/17/2011 0847   ALT 8 01/25/2015 1005   ALT 10* 10/28/2014 0257   ALT 17 11/17/2011 0847   BILITOT 0.51 01/25/2015 1005   BILITOT 0.4 10/28/2014 0257   BILITOT 0.60 11/17/2011 0847       RADIOGRAPHIC STUDIES: Ct Chest W Contrast  01/25/2015   CLINICAL DATA:  Restaging lung cancer.  EXAM: CT CHEST WITH CONTRAST  TECHNIQUE: Multidetector CT imaging of the chest was performed during intravenous contrast administration.  CONTRAST:  45m OMNIPAQUE IOHEXOL 300 MG/ML  SOLN  COMPARISON:  10/25/2014  FINDINGS: Mediastinum: Normal heart size. No pericardial effusion identified. The trachea appears patent and is midline. Normal appearance of the esophagus. No enlarged mediastinal or hilar lymph nodes.  Lungs/Pleura: Moderate to advanced changes of centrilobular emphysema. Changes of external beam radiation are identified within the right lung including consolidation, bronchiectasis, architectural distortion and fibrosis. No suspicious pulmonary nodules  identified.  Upper Abdomen: The visualized portions of the liver and spleen are normal. The adrenal glands are both normal. Visualized portions of the kidneys and pancreas are unremarkable.  Musculoskeletal: No aggressive lytic or sclerotic bone lesions identified. Chronic healed left anterior rib fracture deformities noted.  IMPRESSION: 1. Stable changes of external beam radiation within the right lung. There are no specific features identified to suggest residual or recurrence of tumor within the lungs.   Electronically Signed   By: TKerby MoorsM.D.   On: 01/25/2015 13:07    ASSESSMENT AND PLAN: This is a very pleasant 69years old white female with history of limited stage small cell lung cancer diagnosed in October of 2012 status post systemic chemotherapy as well as palliative radiotherapy to the right hilum with prophylactic cranial irradiation and curative radiotherapy to right lung nodule. Her recent CT scan of the chest showed no evidence for disease progression. I discussed the scan results with the patient and recommended for her to continue on observation with repeat CT scan of the chest in 6 months if there is no evidence for metastatic disease to the brain. For the frequent falls, highly ordered a stat MRI of the brain to rule out brain metastasis. SUV is no evidence for brain metastasis, the patient will need to contact her cardiologist for reevaluation and adjustment of her cardiac medications. For smoke cessation, I strongly encouraged the patient to quit smoking and offered her to smoke cessation program. The patient was also seen by the thoracic navigator for smoke cessation counseling. She was advised to call immediately if she has any concerning symptoms in the interval.  The patient voices understanding of current disease status and treatment options and is in agreement with the current care plan.  All questions were answered. The patient knows to call the clinic with any  problems, questions or concerns. We can certainly see the patient much sooner if necessary.  Disclaimer: This note was dictated with voice recognition software. Similar sounding words can inadvertently be transcribed and may not be corrected upon review.

## 2015-02-01 NOTE — Telephone Encounter (Signed)
Husband to pick up pt today,

## 2015-02-02 ENCOUNTER — Telehealth: Payer: Self-pay | Admitting: Oncology

## 2015-02-02 NOTE — Telephone Encounter (Signed)
The referral for neurology was altered to reflect [facility] and also an inbox message was sent to the pcp for a referral due to her insurance  Janice Daniels

## 2015-02-15 ENCOUNTER — Ambulatory Visit (INDEPENDENT_AMBULATORY_CARE_PROVIDER_SITE_OTHER): Payer: Commercial Managed Care - HMO | Admitting: Neurology

## 2015-02-15 ENCOUNTER — Encounter: Payer: Self-pay | Admitting: Neurology

## 2015-02-15 VITALS — BP 113/76 | HR 112 | Ht 64.0 in | Wt 123.0 lb

## 2015-02-15 DIAGNOSIS — W19XXXD Unspecified fall, subsequent encounter: Secondary | ICD-10-CM

## 2015-02-15 DIAGNOSIS — R930 Abnormal findings on diagnostic imaging of skull and head, not elsewhere classified: Secondary | ICD-10-CM | POA: Diagnosis not present

## 2015-02-15 DIAGNOSIS — R9089 Other abnormal findings on diagnostic imaging of central nervous system: Secondary | ICD-10-CM

## 2015-02-15 MED ORDER — LEVETIRACETAM 250 MG PO TABS: 250.0000 mg | ORAL_TABLET | Freq: Two times a day (BID) | ORAL | Status: DC

## 2015-02-15 NOTE — Progress Notes (Signed)
PATIENT: Janice Daniels DOB: [birth year]  Chief Complaint  Patient presents with  . Abnormal MRI Brain    MMSE 28/30 - 11 animals.  She is here to discuss her abnormal MRI brain scan.     HISTORICAL  Janice Daniels is a 69 years old right-handed female, seen in refer by her primary care physician Dr. Bevelyn Buckles for evaluation of abnormal MRI of the brain was  I have reviewed and summarized most recent office visit February 03 2015 next  She has history of COPD, ongoing tobacco use, right lung cancer, small cell stage III , she was treated with chemotherapy, brain radiation([id number]) History of congestive heart failure,  hypothyroidism, on supplement  Since May 2016, she began to complains of worsening gait difficulty, she fell few times, left leg tends to give out underneath her, she denied loss of consciousness, but there was few episode, she felt that her eyes were rolling backwards, she also reported episodes of uncontrollable left leg jerking, lasting for a few minutes, without loss of consciousness, without spreading to her left arm, she is a poor historian, alone at visit, could not elaborate on details, also complains of memory trouble   I have personally reviewed MRI of the brain with without contrast February 01 2015: Generalized atrophy, with extensive confluent white matter hyperintensity, likely represent post radiation effect contrast enhancing lesion in the right basal ganglion, suggest a subacute hemorrhagic infarction   REVIEW OF SYSTEMS: Full 14 system review of systems performed and notable only for fatigue, murmur, hearing loss, shortness of breath, cough, wheezing, diarrhea, memory loss, weakness  ALLERGIES: Allergies  Allergen Reactions  . Codeine Other (See Comments)    Elevated pulse    HOME MEDICATIONS: Current Outpatient Prescriptions  Medication Sig Dispense Refill  . albuterol (PROVENTIL) (2.5 MG/3ML) 0.083% nebulizer solution Take 3 mLs  (2.5 mg total) by nebulization every 2 (two) hours as needed for wheezing or shortness of breath. 75 mL 0  . albuterol (VENTOLIN HFA) 108 (90 BASE) MCG/ACT inhaler Inhale 2 puffs into the lungs every 6 (six) hours as needed for wheezing or shortness of breath.     Marland Kitchen aspirin EC 81 MG EC tablet Take 1 tablet (81 mg total) by mouth daily.    . citalopram (CELEXA) 10 MG tablet Take 10 mg by mouth daily.      Marland Kitchen dextromethorphan-guaiFENesin (MUCINEX DM) 30-600 MG per 12 hr tablet Take 2 tablets by mouth 2 (two) times daily. 20 tablet 0  . Fluticasone-Salmeterol (ADVAIR) 250-50 MCG/DOSE AEPB Inhale 1 puff into the lungs 2 (two) times daily.    Marland Kitchen gabapentin (NEURONTIN) 600 MG tablet Take 600 mg by mouth at bedtime. Per [facility] at Dr. Shon Baton office.    Marland Kitchen ipratropium-albuterol (DUONEB) 0.5-2.5 (3) MG/3ML SOLN Take 3 mLs by nebulization every 6 (six) hours. 360 mL 0  . levothyroxine (SYNTHROID, LEVOTHROID) 125 MCG tablet Take 1 tablet (125 mcg total) by mouth daily. 30 tablet 0  . lisinopril (PRINIVIL,ZESTRIL) 2.5 MG tablet Take 1 tablet (2.5 mg total) by mouth 2 (two) times daily. 30 tablet 0  . loratadine (CLARITIN) 10 MG tablet Take 10 mg by mouth daily as needed for allergies.     . montelukast (SINGULAIR) 10 MG tablet Take 10 mg by mouth daily.    . simvastatin (ZOCOR) 20 MG tablet Take 1 tablet (20 mg total) by mouth daily. 30 tablet 3  . spironolactone (ALDACTONE) 25 MG tablet Take 0.5 tablets (12.5 mg total)  by mouth daily. 45 tablet 3  . tiotropium (SPIRIVA) 18 MCG inhalation capsule Place 18 mcg into inhaler and inhale daily.    . varenicline (CHANTIX) 1 MG tablet Take 1 mg by mouth daily.    . Vitamin D, Ergocalciferol, (DRISDOL) 50000 UNITS CAPS capsule Take 50,000 Units by mouth every 7 (seven) days. Monday     No current facility-administered medications for this visit.    PAST MEDICAL HISTORY: Past Medical History  Diagnosis Date  . Hypertension   . Hypothyroid   . GERD (gastroesophageal  reflux disease)   . COPD (chronic obstructive pulmonary disease)   . Low back pain   . Lung cancer 05/09/2011    RUL  . Depression   . History of radiation therapy 06/05/11 to 07/18/11    lung  . History of radiation therapy 09/13/11 to 09/26/11    brain  . Shingles   . Lung cancer, upper lobe 09/02/2012    rul  . History of chemotherapy     carboplatin and etoposide  . Hx of radiation therapy 10/03/12 - 10/09/12    RUL lung  . Acute on chronic systolic CHF (congestive heart failure)     PAST SURGICAL HISTORY: Past Surgical History  Procedure Laterality Date  . Tubal ligation  1984  . Total abdominal hysterectomy  1986  . Tonsillectomy    . Endobronchial biopsy Right 05/03/2011    FAMILY HISTORY: Family History  Problem Relation Age of Onset  . Esophageal cancer Father   . Hypothyroidism Father   . Cancer Father     esophageal  . Heart disease Mother   . Hyperlipidemia Mother   . Hypertension Mother     heart disease  . Other Brother     low back pain  . Cancer Brother     esophageal  . Other Brother     hypochondriasis  . Other Brother     suicide  . Esophageal cancer Brother   . Cervical cancer Sister   . Cancer Sister     cervical    SOCIAL HISTORY:  Social History   Social History  . Marital Status: Married    Spouse Name: [name]  . Number of Children: 5  . Years of Education: 12   Occupational History  . retired Quarry manager    Social History Main Topics  . Smoking status: Current Some Day Smoker -- 1.00 packs/day for 50 years    Types: Cigarettes    Last Attempt to Quit: 11/22/2014  . Smokeless tobacco: Never Used     Comment: Smokes 1 pack per week  . Alcohol Use: No  . Drug Use: No  . Sexual Activity: Not on file   Other Topics Concern  . Not on file   Social History Narrative   She is married to [place] and had 5 sons.  She had a daughter who died at 31 months.  Her son, Heron Sabins, died at age 58 from alcoholism and pancreas disease.  Her son, Merry Proud,  is 56 was born [birth year] and is alive and well.  Her son, Clair Gulling, is 66, born [birth year], and has bipolar disorder.  Her son, Jenny Reichmann, is 28, born 66, and has kidney disease.  Her son, Corene Cornea, is age 35, born [birth year], and is in the [place].    Right-handed.   2-4 cups caffeine per day.     PHYSICAL EXAM   Filed Vitals:   02/15/15 1145  BP: 113/76  Pulse: 112  Height: '5\' 4"'$  (1.626 m)  Weight: 123 lb (55.792 kg)    Not recorded      Body mass index is 21.1 kg/(m^2).  PHYSICAL EXAMNIATION:  Gen: NAD, conversant, well nourised, obese, well groomed                     Cardiovascular: Regular rate rhythm, no peripheral edema, warm, nontender. Eyes: Conjunctivae clear without exudates or hemorrhage Neck: Supple, no carotid bruise. Pulmonary: Clear to auscultation bilaterally   NEUROLOGICAL EXAM:  MENTAL STATUS: Speech:    Speech is normal; fluent and spontaneous with normal comprehension.  Cognition: Mini-Mental Status Examination is 28 out of 30, animal naming is 11     Orientation to time, place and person     Recent and remote memory: She missed 2 out of 3 recalls     Normal Attention span and concentration     Normal Language, naming, repeating,spontaneous speech     Fund of knowledge   CRANIAL NERVES: CN II: Visual fields are full to confrontation. Fundoscopic exam is normal with sharp discs and no vascular changes. Pupils are round equal and briskly reactive to light. CN III, IV, VI: extraocular movement are normal. No ptosis. CN V: Facial sensation is intact to pinprick in all 3 divisions bilaterally. Corneal responses are intact.  CN VII: Face is symmetric with normal eye closure and smile. CN VIII: Hearing is normal to rubbing fingers CN IX, X: Palate elevates symmetrically. Phonation is normal. CN XI: Head turning and shoulder shrug are intact CN XII: Tongue is midline with normal movements and no atrophy.  MOTOR: There is no pronator drift of out-stretched arms.  Muscle bulk and tone are normal. Muscle strength is normal.  REFLEXES: Reflexes are 2+ and symmetric at the biceps, triceps, knees, and ankles. Plantar responses are flexor.  SENSORY: Intact to light touch, pinprick, position sense, and vibration sense are intact in fingers and toes.  COORDINATION: Rapid alternating movements and fine finger movements are intact. There is no dysmetria on finger-to-nose and heel-knee-shin.    GAIT/STANCE: Wide based cautious unsteady gait   DIAGNOSTIC DATA (LABS, IMAGING, TESTING) - I reviewed patient records, labs, notes, testing and imaging myself where available.   ASSESSMENT AND PLAN  Janice Daniels is a 69 y.o. female   Frequent falling episode  This could due to her subacute right basal ganglion infarction, extensive white matter disease, Left leg jerking episodes  Possible partial seizure  EEG  Keppra 250 twice a day  Advise her document all event, bring her family at next visit  Memory loss  Mini-Mental Status Examination is 28 out of 30  Extensive white matter disease, age-related changes, she also has multiple vascular risk factor of hypertension, longtime smoker, post radiation damage  Marcial Pacas, M.D. Ph.D.  [facility] [address], [place], [place] [address] Ph: [phone] Fax: [phone]  CC: Dr. Bevelyn Buckles

## 2015-02-21 ENCOUNTER — Other Ambulatory Visit (HOSPITAL_COMMUNITY): Payer: Self-pay | Admitting: Internal Medicine

## 2015-03-09 ENCOUNTER — Ambulatory Visit (INDEPENDENT_AMBULATORY_CARE_PROVIDER_SITE_OTHER): Payer: Commercial Managed Care - HMO | Admitting: Neurology

## 2015-03-09 DIAGNOSIS — R410 Disorientation, unspecified: Secondary | ICD-10-CM | POA: Diagnosis not present

## 2015-03-09 DIAGNOSIS — R9089 Other abnormal findings on diagnostic imaging of central nervous system: Secondary | ICD-10-CM

## 2015-03-09 DIAGNOSIS — W19XXXD Unspecified fall, subsequent encounter: Secondary | ICD-10-CM

## 2015-03-11 NOTE — Procedures (Signed)
   HISTORY:  69 years old female, with history of gait disorder, memory disturbance.  TECHNIQUE:  16 channel EEG was performed based on standard 10-16 international system. One channel was dedicated to EKG, which has demonstrates regular rhythm of 107 beats per minutes.  Upon awakening, the posterior background activity was well-developed, in alpha range, 9 Hz, reactive to eye opening and closure.  There was occasionally T3 sharp transient.  Photic stimulation was performed, which induced a symmetric photic driving.  Hyperventilation was performed, there was no abnormality elicit.  No sleep was achieved.  CONCLUSION: This is a  normal awake EEG.  There is no electrodiagnostic evidence of epileptiform discharge

## 2015-03-23 ENCOUNTER — Other Ambulatory Visit (HOSPITAL_COMMUNITY): Payer: Self-pay | Admitting: Internal Medicine

## 2015-04-13 ENCOUNTER — Ambulatory Visit (INDEPENDENT_AMBULATORY_CARE_PROVIDER_SITE_OTHER): Payer: Commercial Managed Care - HMO | Admitting: Neurology

## 2015-04-13 ENCOUNTER — Encounter: Payer: Self-pay | Admitting: Neurology

## 2015-04-13 VITALS — BP 111/71 | HR 110 | Ht 64.0 in | Wt 121.0 lb

## 2015-04-13 DIAGNOSIS — C3490 Malignant neoplasm of unspecified part of unspecified bronchus or lung: Secondary | ICD-10-CM | POA: Diagnosis not present

## 2015-04-13 DIAGNOSIS — G40209 Localization-related (focal) (partial) symptomatic epilepsy and epileptic syndromes with complex partial seizures, not intractable, without status epilepticus: Secondary | ICD-10-CM

## 2015-04-13 DIAGNOSIS — J432 Centrilobular emphysema: Secondary | ICD-10-CM

## 2015-04-13 MED ORDER — LEVETIRACETAM ER 750 MG PO TB24: 750.0000 mg | ORAL_TABLET | Freq: Every day | ORAL | Status: DC

## 2015-04-13 NOTE — Progress Notes (Signed)
Chief Complaint  Patient presents with  . Falls/Memory    MMSE 27/30 - 11 animals. She is here with her husband, Janice Daniels.  She fell 5 days ago and hit her head.  Feels her shaking has improved with Keppra.  She would like to go over her EEG results.      PATIENT: Janice Daniels DOB: 09-Jun-1946  Chief Complaint  Patient presents with  . Falls/Memory    MMSE 27/30 - 11 animals. She is here with her husband, Janice Daniels.  She fell 5 days ago and hit her head.  Feels her shaking has improved with Keppra.  She would like to go over her EEG results.     HISTORICAL  Janice Daniels is a 69 years old right-handed female, seen in refer by her primary care physician Dr. Bevelyn Buckles for evaluation of abnormal MRI of the brain in February 15 2015.  I have reviewed and summarized most recent office visit February 03 2015.  She has history of COPD, ongoing tobacco use, right lung cancer, small cell stage III , status post palliative radiation to right hand hilum lung cancer, systemic chemotherapy, prophylactic cranial radiation therapy,  history of congestive heart failure,  hypothyroidism, on supplement  Since May 2016, she began to complains of worsening gait difficulty, she fell few times, left leg tends to give out underneath her, she denied loss of consciousness, but there was few episode, she felt that her eyes were rolling backwards, she also reported episodes of uncontrollable left leg jerking, lasting for a few minutes, without loss of consciousness, without spreading to her left arm, she is a poor historian, alone at visit, could not elaborate on details, also complains of memory trouble  I have personally reviewed MRI of the brain with without contrast February 01 2015: Generalized atrophy, with extensive confluent white matter hyperintensity, likely represent post radiation effect contrast enhancing lesion in the right basal ganglion, suggest a subacute hemorrhagic infarction  UPDATE  Apr 13 2015: She was started on Keppra 250 mg twice a day, suspicious for probable complex partial seizure  Her husband is with her at today's visit, reported 6-8 similar transient confusion, falling episodes since May 2016, husband also reported episode of eyes rolled back, body twitching, left leg jerking, transient loss of consciousness, lasting for few minutes, followed by confusion,   EEG was normal in September 2016  She was able to tolerate Keppra 250 mg twice a day, while she was taking Keppra, she has no seizure-like episode, but she had a few days not taking her Keppra, she began to have recurrent left leg jerking, unexpected falling.  REVIEW OF SYSTEMS: Full 14 system review of systems performed and notable only for fatigue, murmur, hearing loss, shortness of breath, cough, wheezing, diarrhea, memory loss, weakness  ALLERGIES: Allergies  Allergen Reactions  . Codeine Other (See Comments)    Elevated pulse    HOME MEDICATIONS: Current Outpatient Prescriptions  Medication Sig Dispense Refill  . albuterol (PROVENTIL) (2.5 MG/3ML) 0.083% nebulizer solution Take 3 mLs (2.5 mg total) by nebulization every 2 (two) hours as needed for wheezing or shortness of breath. 75 mL 0  . albuterol (VENTOLIN HFA) 108 (90 BASE) MCG/ACT inhaler Inhale 2 puffs into the lungs every 6 (six) hours as needed for wheezing or shortness of breath.     Marland Kitchen aspirin EC 81 MG EC tablet Take 1 tablet (81 mg total) by mouth daily.    . citalopram (CELEXA) 10 MG tablet Take 10  mg by mouth daily.      Marland Kitchen dextromethorphan-guaiFENesin (MUCINEX DM) 30-600 MG per 12 hr tablet Take 2 tablets by mouth 2 (two) times daily. 20 tablet 0  . Fluticasone-Salmeterol (ADVAIR) 250-50 MCG/DOSE AEPB Inhale 1 puff into the lungs 2 (two) times daily.    Marland Kitchen gabapentin (NEURONTIN) 600 MG tablet Take 600 mg by mouth at bedtime. Per [facility] at Dr. Shon Baton office.    Marland Kitchen ipratropium (ATROVENT HFA) 17 MCG/ACT inhaler Inhale 2 puffs into the lungs  every 6 (six) hours.    Marland Kitchen ipratropium-albuterol (DUONEB) 0.5-2.5 (3) MG/3ML SOLN Take 3 mLs by nebulization every 6 (six) hours. 360 mL 0  . levETIRAcetam (KEPPRA) 250 MG tablet Take 1 tablet (250 mg total) by mouth 2 (two) times daily. 60 tablet 6  . levothyroxine (SYNTHROID, LEVOTHROID) 125 MCG tablet Take 1 tablet (125 mcg total) by mouth daily. 30 tablet 0  . lisinopril (PRINIVIL,ZESTRIL) 2.5 MG tablet Take 1 tablet (2.5 mg total) by mouth 2 (two) times daily. 30 tablet 0  . loratadine (CLARITIN) 10 MG tablet Take 10 mg by mouth daily as needed for allergies.     . montelukast (SINGULAIR) 10 MG tablet Take 10 mg by mouth daily.    . simvastatin (ZOCOR) 20 MG tablet TAKE 1 TABLET(20 MG) BY MOUTH DAILY 30 tablet 0  . spironolactone (ALDACTONE) 25 MG tablet Take 0.5 tablets (12.5 mg total) by mouth daily. 45 tablet 3  . tiotropium (SPIRIVA) 18 MCG inhalation capsule Place 18 mcg into inhaler and inhale daily.    . varenicline (CHANTIX) 1 MG tablet Take 1 mg by mouth daily.    . Vitamin D, Ergocalciferol, (DRISDOL) 50000 UNITS CAPS capsule Take 50,000 Units by mouth every 7 (seven) days. Monday     No current facility-administered medications for this visit.    PAST MEDICAL HISTORY: Past Medical History  Diagnosis Date  . Hypertension   . Hypothyroid   . GERD (gastroesophageal reflux disease)   . COPD (chronic obstructive pulmonary disease) ([place])   . Low back pain   . Lung cancer ([place]) 05/09/2011    RUL  . Depression   . History of radiation therapy 06/05/11 to 07/18/11    lung  . History of radiation therapy 09/13/11 to 09/26/11    brain  . Shingles   . Lung cancer, upper lobe ([place]) 09/02/2012    rul  . History of chemotherapy     carboplatin and etoposide  . Hx of radiation therapy 10/03/12 - 10/09/12    RUL lung  . Acute on chronic systolic CHF (congestive heart failure) ([place])     PAST SURGICAL HISTORY: Past Surgical History  Procedure Laterality Date  . Tubal ligation  1984    . Total abdominal hysterectomy  1986  . Tonsillectomy    . Endobronchial biopsy Right 05/03/2011    FAMILY HISTORY: Family History  Problem Relation Age of Onset  . Esophageal cancer Father   . Hypothyroidism Father   . Cancer Father     esophageal  . Heart disease Mother   . Hyperlipidemia Mother   . Hypertension Mother     heart disease  . Other Brother     low back pain  . Cancer Brother     esophageal  . Other Brother     hypochondriasis  . Other Brother     suicide  . Esophageal cancer Brother   . Cervical cancer Sister   . Cancer Sister     cervical  SOCIAL HISTORY:  Social History   Social History  . Marital Status: Married    Spouse Name: Janice Daniels  . Number of Children: 5  . Years of Education: 12   Occupational History  . retired Quarry manager    Social History Main Topics  . Smoking status: Current Some Day Smoker -- 1.00 packs/day for 50 years    Types: Cigarettes    Last Attempt to Quit: 11/22/2014  . Smokeless tobacco: Never Used     Comment: Smokes 1 pack per week  . Alcohol Use: No  . Drug Use: No  . Sexual Activity: Not on file   Other Topics Concern  . Not on file   Social History Narrative   She is married to [place] and had 5 sons.  She had a daughter who died at 56 months.  Her son, Heron Sabins, died at age 51 from alcoholism and pancreas disease.  Her son, Merry Proud, is 72 was born 55 and is alive and well.  Her son, Clair Gulling, is 67, born [birth year], and has bipolar disorder.  Her son, Jenny Reichmann, is 1, born [birth year], and has kidney disease.  Her son, Corene Cornea, is age 27, born 67, and is in the [place].    Right-handed.   2-4 cups caffeine per day.     PHYSICAL EXAM   Filed Vitals:   04/13/15 1056  BP: 111/71  Pulse: 110  Height: '5\' 4"'$  (1.626 m)  Weight: 121 lb (54.885 kg)    Not recorded      Body mass index is 20.76 kg/(m^2).  PHYSICAL EXAMNIATION:  Gen: NAD, conversant, well nourised, obese, well groomed                      Cardiovascular: Regular rate rhythm, no peripheral edema, warm, nontender. Eyes: Conjunctivae clear without exudates or hemorrhage Neck: Supple, no carotid bruise. Pulmonary: Clear to auscultation bilaterally   NEUROLOGICAL EXAM:  MENTAL STATUS: Speech:    Speech is normal; fluent and spontaneous with normal comprehension.  Cognition: Mini-Mental Status Examination is 27 out of 30, animal naming is 11     Orientation to time, place and person she is not oriented to date:      Recent and remote memory: She missed 2 out of 3 recalls     Normal Attention span and concentration     Normal Language, naming, repeating,spontaneous speech     Fund of knowledge   CRANIAL NERVES: CN II: Visual fields are full to confrontation. Fundoscopic exam is normal with sharp discs and no vascular changes. Pupils are round equal and briskly reactive to light. CN III, IV, VI: extraocular movement are normal. No ptosis. CN V: Facial sensation is intact to pinprick in all 3 divisions bilaterally. Corneal responses are intact.  CN VII: Face is symmetric with normal eye closure and smile. CN VIII: Hearing is normal to rubbing fingers CN IX, X: Palate elevates symmetrically. Phonation is normal. CN XI: Head turning and shoulder shrug are intact CN XII: Tongue is midline with normal movements and no atrophy.  MOTOR: She has mild fixation of left arm up on rapid rotating movement, mild left hip flexion, ankle dorsiflexion weakness  REFLEXES: Reflexes are 2+ and symmetric at the biceps, triceps, knees, and ankles. Plantar responses are flexor.  SENSORY: Intact to light touch, pinprick, position sense, and vibration sense are intact in fingers and toes.  COORDINATION: Rapid alternating movements and fine finger movements are intact. There is no dysmetria  on finger-to-nose and heel-knee-shin.    GAIT/STANCE: Wide based cautious unsteady gait, tends to hold her left arm he elbow flexion, dragging her left  leg some   DIAGNOSTIC DATA (LABS, IMAGING, TESTING) - I reviewed patient records, labs, notes, testing and imaging myself where available.   ASSESSMENT AND PLAN  ARTHEA NOBEL is a 69 y.o. female   Complex partial seizure  She was not complying with Keppra 250 twice a day.  Change to Keppra xr 750 every night  Advise her document all event Memory loss  Mini-Mental Status Examination is 27 out of 30  Extensive white matter disease, atrophy following cranial radiation therapy, she also has multiple vascular risk factor of hypertension, longtime smoker, post radiation damage Gait difficulty  Mild left-sided weakness, most consistent with her extensive periventricular white matter changes, right side dominant  Marcial Pacas, M.D. Ph.D.  [facility] [address] [place], [place] [address] Ph: [phone] Fax: [phone]  CC: Dr. Bevelyn Buckles

## 2015-04-25 ENCOUNTER — Other Ambulatory Visit (HOSPITAL_COMMUNITY): Payer: Self-pay | Admitting: Internal Medicine

## 2015-04-27 ENCOUNTER — Other Ambulatory Visit (HOSPITAL_COMMUNITY): Payer: Self-pay | Admitting: *Deleted

## 2015-05-18 ENCOUNTER — Ambulatory Visit (INDEPENDENT_AMBULATORY_CARE_PROVIDER_SITE_OTHER): Payer: Commercial Managed Care - HMO | Admitting: Pulmonary Disease

## 2015-05-18 ENCOUNTER — Encounter: Payer: Self-pay | Admitting: Pulmonary Disease

## 2015-05-18 VITALS — BP 128/78 | HR 125 | Ht 64.0 in | Wt 121.8 lb

## 2015-05-18 DIAGNOSIS — K5900 Constipation, unspecified: Secondary | ICD-10-CM

## 2015-05-18 DIAGNOSIS — J432 Centrilobular emphysema: Secondary | ICD-10-CM | POA: Diagnosis not present

## 2015-05-18 NOTE — Assessment & Plan Note (Signed)
This is a new problem and somewhat severe. However, her physical exam is benign today in that she has a soft abdomen and has bowel sounds. So I am not concerned about a small bowel obstruction. However, if she does not respond to over-the-counter laxatives and stool softeners which I will recommend, I have advised that she go to the emergency room for further evaluation.  Plan: MiraLAX twice a day for the next 3 days Senna twice a day If no improvement in 2 days then go to the emergency room

## 2015-05-18 NOTE — Assessment & Plan Note (Signed)
She has cut back significantly on tobacco and she stop using all of her controller medications. She is using ipratropium and albuterol 3 times a day which seems to be effective. She has not had an exacerbation since the last visit.  Her flu and pneumonia vaccines are up-to-date  Plan: I explained to her today that because she only has moderate airflow obstruction and minimal symptoms it's reasonable to stay off of the 2 controller medications for now but I do want her to continue the DuoNeb 3 times a day.  I strongly encouraged her to quit smoking.

## 2015-05-18 NOTE — Patient Instructions (Signed)
Drink plenty of water Use MiraLAX twice a day for the next 3-4 days with water Take over-the-counter senna twice a day until you have had a bowel movement If you have not had improvement (no bowel movement) in 2 days then go to the emergency room Stop smoking Keep using her nebulized medications as you're doing We will see you back in 6 months or sooner if needed

## 2015-05-18 NOTE — Progress Notes (Signed)
Subjective:    Patient ID: Janice Daniels, female    DOB: [birth year], 69 y.o.   MRN: [id number]  Synopsis: former [facility] patient with COPD and a history of lung cancer. PFT's 04/2011:  FEV1 1.57 (77%), ratio 47%, ++airtrapping, DLCO normal Referred to pulmonary rehab 03/2012 >> decided not to go.  Stage IIIa [place] lung cancer diagnosed 2012 treated with chemo and radiation, last treated in 2013. Quit smoking in 2016 after starting at age 79. Smoked 1ppd.  HPI Chief Complaint  Patient presents with  . Follow-up    pt. states breathing is baseline. occ. SOB. occ. prod. cough clear in color. denies wheezing or chest pain/tightness.    Fletcher has been constipated more lately an she says that has been having belly pain for two weeks.  No nausea or vomiting, can't remember last bowel movements.  Has tried over the counter stool softeners, not help. Has not taken senna.  No weight loss, no change in stool caliber, no blood in stool.  Her breathing has been OK lately.  She has been taking her albuterol three times a day and it has been working well for her.  She has not been taking the Spiriva and Advair but she hasn't noticed a difference since stopping.    Has not been to another doctor for COPD treatment, no exacerbations.  She is still smoking 1/3 pack a day, she has cut back.    Past Medical History  Diagnosis Date  . Hypertension   . Hypothyroid   . GERD (gastroesophageal reflux disease)   . COPD (chronic obstructive pulmonary disease) ([place])   . Low back pain   . Lung cancer ([place]) 05/09/2011    RUL  . Depression   . History of radiation therapy 06/05/11 to 07/18/11    lung  . History of radiation therapy 09/13/11 to 09/26/11    brain  . Shingles   . Lung cancer, upper lobe ([place]) 09/02/2012    rul  . History of chemotherapy     carboplatin and etoposide  . Hx of radiation therapy 10/03/12 - 10/09/12    RUL lung  . Acute on chronic systolic CHF (congestive heart failure)  ([place])       Review of Systems  Constitutional: Negative for fever, diaphoresis and fatigue.  HENT: Negative for postnasal drip, rhinorrhea and sinus pressure.   Respiratory: Positive for cough. Negative for shortness of breath and wheezing.   Cardiovascular: Negative for chest pain, palpitations and leg swelling.  Gastrointestinal: Positive for abdominal pain and constipation. Negative for nausea, vomiting and blood in stool.       Objective:   Physical Exam  Filed Vitals:   05/18/15 1531  BP: 128/78  Pulse: 125  Height: '5\' 4"'$  (1.626 m)  Weight: 121 lb 12.8 oz (55.248 kg)  SpO2: 96%   RA  Gen: well appearing HENT: OP clear, TM's clear, neck supple PULM: CTA B, normal percussion CV: RRR, no mgr, trace edema GI: BS+, soft, nontender Derm: no cyanosis or rash Psyche: normal mood and affect  Prior records from my partner reviewed where she was treated for COPD Records reviewed, no recent treatment for constipation      Assessment & Plan:  COPD (chronic obstructive pulmonary disease) with emphysema ([place]) She has cut back significantly on tobacco and she stop using all of her controller medications. She is using ipratropium and albuterol 3 times a day which seems to be effective. She has not had an exacerbation since  the last visit.  Her flu and pneumonia vaccines are up-to-date  Plan: I explained to her today that because she only has moderate airflow obstruction and minimal symptoms it's reasonable to stay off of the 2 controller medications for now but I do want her to continue the DuoNeb 3 times a day.  I strongly encouraged her to quit smoking.  Constipation This is a new problem and somewhat severe. However, her physical exam is benign today in that she has a soft abdomen and has bowel sounds. So I am not concerned about a small bowel obstruction. However, if she does not respond to over-the-counter laxatives and stool softeners which I will recommend, I have advised  that she go to the emergency room for further evaluation.  Plan: MiraLAX twice a day for the next 3 days Senna twice a day If no improvement in 2 days then go to the emergency room     Current outpatient prescriptions:  .  albuterol (VENTOLIN HFA) 108 (90 BASE) MCG/ACT inhaler, Inhale 2 puffs into the lungs every 6 (six) hours as needed for wheezing or shortness of breath. , Disp: , Rfl:  .  aspirin EC 81 MG EC tablet, Take 1 tablet (81 mg total) by mouth daily., Disp: , Rfl:  .  citalopram (CELEXA) 10 MG tablet, Take 10 mg by mouth daily.  , Disp: , Rfl:  .  dextromethorphan-guaiFENesin (MUCINEX DM) 30-600 MG per 12 hr tablet, Take 2 tablets by mouth 2 (two) times daily., Disp: 20 tablet, Rfl: 0 .  gabapentin (NEURONTIN) 600 MG tablet, Take 600 mg by mouth at bedtime. Per [facility] at Dr. Shon Baton office., Disp: , Rfl:  .  ipratropium (ATROVENT HFA) 17 MCG/ACT inhaler, Inhale 2 puffs into the lungs every 6 (six) hours., Disp: , Rfl:  .  ipratropium-albuterol (DUONEB) 0.5-2.5 (3) MG/3ML SOLN, Take 3 mLs by nebulization every 6 (six) hours., Disp: 360 mL, Rfl: 0 .  Levetiracetam (KEPPRA XR) 750 MG TB24, Take 1 tablet (750 mg total) by mouth at bedtime., Disp: 30 tablet, Rfl: 11 .  levothyroxine (SYNTHROID, LEVOTHROID) 125 MCG tablet, Take 1 tablet (125 mcg total) by mouth daily., Disp: 30 tablet, Rfl: 0 .  lisinopril (PRINIVIL,ZESTRIL) 2.5 MG tablet, Take 1 tablet (2.5 mg total) by mouth 2 (two) times daily., Disp: 30 tablet, Rfl: 0 .  montelukast (SINGULAIR) 10 MG tablet, Take 10 mg by mouth daily., Disp: , Rfl:  .  simvastatin (ZOCOR) 20 MG tablet, TAKE 1 TABLET(20 MG) BY MOUTH DAILY, Disp: 30 tablet, Rfl: 0 .  spironolactone (ALDACTONE) 25 MG tablet, Take 0.5 tablets (12.5 mg total) by mouth daily., Disp: 45 tablet, Rfl: 3 .  Vitamin D, Ergocalciferol, (DRISDOL) 50000 UNITS CAPS capsule, Take 50,000 Units by mouth every 7 (seven) days. Monday, Disp: , Rfl:  .  varenicline (CHANTIX) 1 MG tablet,  Take 1 mg by mouth daily., Disp: , Rfl:

## 2015-05-26 ENCOUNTER — Encounter: Payer: Self-pay | Admitting: Gastroenterology

## 2015-06-01 ENCOUNTER — Other Ambulatory Visit (HOSPITAL_COMMUNITY): Payer: Self-pay | Admitting: Internal Medicine

## 2015-06-29 ENCOUNTER — Other Ambulatory Visit (HOSPITAL_COMMUNITY): Payer: Self-pay | Admitting: Internal Medicine

## 2015-07-12 ENCOUNTER — Ambulatory Visit: Payer: Commercial Managed Care - HMO | Admitting: Neurology

## 2015-07-30 ENCOUNTER — Ambulatory Visit (HOSPITAL_COMMUNITY): Payer: Commercial Managed Care - HMO

## 2015-08-02 ENCOUNTER — Ambulatory Visit (HOSPITAL_COMMUNITY)
Admission: RE | Admit: 2015-08-02 | Discharge: 2015-08-02 | Disposition: A | Discharge status: Home / Self Care | Payer: PPO | Source: Ambulatory Visit | Attending: Internal Medicine | Admitting: Internal Medicine

## 2015-08-02 ENCOUNTER — Other Ambulatory Visit (HOSPITAL_BASED_OUTPATIENT_CLINIC_OR_DEPARTMENT_OTHER): Payer: PPO

## 2015-08-02 ENCOUNTER — Encounter (HOSPITAL_COMMUNITY): Payer: Self-pay

## 2015-08-02 DIAGNOSIS — Z923 Personal history of irradiation: Secondary | ICD-10-CM | POA: Diagnosis not present

## 2015-08-02 DIAGNOSIS — Z9221 Personal history of antineoplastic chemotherapy: Secondary | ICD-10-CM | POA: Diagnosis not present

## 2015-08-02 DIAGNOSIS — C3411 Malignant neoplasm of upper lobe, right bronchus or lung: Secondary | ICD-10-CM | POA: Diagnosis not present

## 2015-08-02 DIAGNOSIS — C3491 Malignant neoplasm of unspecified part of right bronchus or lung: Secondary | ICD-10-CM | POA: Diagnosis not present

## 2015-08-02 DIAGNOSIS — Z85118 Personal history of other malignant neoplasm of bronchus and lung: Secondary | ICD-10-CM

## 2015-08-02 DIAGNOSIS — J439 Emphysema, unspecified: Secondary | ICD-10-CM | POA: Insufficient documentation

## 2015-08-02 LAB — COMPREHENSIVE METABOLIC PANEL WITH GFR
ALT: 11 U/L (ref 0–55)
AST: 15 U/L (ref 5–34)
Albumin: 4 g/dL (ref 3.5–5.0)
Alkaline Phosphatase: 64 U/L (ref 40–150)
Anion Gap: 8 meq/L (ref 3–11)
BUN: 6.6 mg/dL — ABNORMAL LOW (ref 7.0–26.0)
CO2: 26 meq/L (ref 22–29)
Calcium: 9.7 mg/dL (ref 8.4–10.4)
Chloride: 106 meq/L (ref 98–109)
Creatinine: 0.9 mg/dL (ref 0.6–1.1)
EGFR: 67 ml/min/1.73 m2 — ABNORMAL LOW (ref >90)
Glucose: 92 mg/dL (ref 70–140)
Potassium: 4.2 meq/L (ref 3.5–5.1)
Sodium: 141 meq/L (ref 136–145)
Total Bilirubin: 0.39 mg/dL (ref 0.20–1.20)
Total Protein: 7.3 g/dL (ref 6.4–8.3)

## 2015-08-02 LAB — CBC WITH DIFFERENTIAL/PLATELET
BASO%: 0.2 % (ref 0.0–2.0)
Basophils Absolute: 0 10*3/uL (ref 0.0–0.1)
EOS%: 1.2 % (ref 0.0–7.0)
Eosinophils Absolute: 0.1 10*3/uL (ref 0.0–0.5)
HEMATOCRIT: 41.8 % (ref 34.8–46.6)
HGB: 13.9 g/dL (ref 11.6–15.9)
LYMPH#: 1 10*3/uL (ref 0.9–3.3)
LYMPH%: 20.6 % (ref 14.0–49.7)
MCH: 30.9 pg (ref 25.1–34.0)
MCHC: 33.3 g/dL (ref 31.5–36.0)
MCV: 92.9 fL (ref 79.5–101.0)
MONO#: 0.6 10*3/uL (ref 0.1–0.9)
MONO%: 11.7 % (ref 0.0–14.0)
NEUT%: 66.3 % (ref 38.4–76.8)
NEUTROS ABS: 3.2 10*3/uL (ref 1.5–6.5)
Platelets: 239 10*3/uL (ref 145–400)
RBC: 4.5 10*6/uL (ref 3.70–5.45)
RDW: 12.6 % (ref 11.2–14.5)
WBC: 4.9 10*3/uL (ref 3.9–10.3)

## 2015-08-02 MED ORDER — IOHEXOL 300 MG/ML  SOLN
75.0000 mL | Freq: Once | INTRAMUSCULAR | Status: AC | PRN
  Administered 2015-08-02: 75 mL via INTRAVENOUS

## 2015-08-09 ENCOUNTER — Ambulatory Visit (HOSPITAL_BASED_OUTPATIENT_CLINIC_OR_DEPARTMENT_OTHER): Payer: PPO | Admitting: Internal Medicine

## 2015-08-09 ENCOUNTER — Encounter: Payer: Self-pay | Admitting: Internal Medicine

## 2015-08-09 ENCOUNTER — Telehealth: Payer: Self-pay | Admitting: Internal Medicine

## 2015-08-09 VITALS — BP 139/75 | HR 115 | Temp 98.3°F | Resp 19 | Ht 64.0 in | Wt 120.3 lb

## 2015-08-09 DIAGNOSIS — Z720 Tobacco use: Secondary | ICD-10-CM

## 2015-08-09 DIAGNOSIS — Z85118 Personal history of other malignant neoplasm of bronchus and lung: Secondary | ICD-10-CM

## 2015-08-09 DIAGNOSIS — C3411 Malignant neoplasm of upper lobe, right bronchus or lung: Secondary | ICD-10-CM

## 2015-08-09 NOTE — Patient Instructions (Signed)
Smoking Cessation, Tips for Success If you are ready to quit smoking, congratulations! You have chosen to help yourself be healthier. Cigarettes bring nicotine, tar, carbon monoxide, and other irritants into your body. Your lungs, heart, and blood vessels will be able to work better without these poisons. There are many different ways to quit smoking. Nicotine gum, nicotine patches, a nicotine inhaler, or nicotine nasal spray can help with physical craving. Hypnosis, support groups, and medicines help break the habit of smoking. WHAT THINGS CAN I DO TO MAKE QUITTING EASIER?  Here are some tips to help you quit for good:  Pick a date when you will quit smoking completely. Tell all of your friends and family about your plan to quit on that date.  Do not try to slowly cut down on the number of cigarettes you are smoking. Pick a quit date and quit smoking completely starting on that day.  Throw away all cigarettes.   Clean and remove all ashtrays from your home, work, and car.  On a card, write down your reasons for quitting. Carry the card with you and read it when you get the urge to smoke.  Cleanse your body of nicotine. Drink enough water and fluids to keep your urine clear or pale yellow. Do this after quitting to flush the nicotine from your body.  Learn to predict your moods. Do not let a bad situation be your excuse to have a cigarette. Some situations in your life might tempt you into wanting a cigarette.  Never have "just one" cigarette. It leads to wanting another and another. Remind yourself of your decision to quit.  Change habits associated with smoking. If you smoked while driving or when feeling stressed, try other activities to replace smoking. Stand up when drinking your coffee. Brush your teeth after eating. Sit in a different chair when you read the paper. Avoid alcohol while trying to quit, and try to drink fewer caffeinated beverages. Alcohol and caffeine may urge you to  smoke.  Avoid foods and drinks that can trigger a desire to smoke, such as sugary or spicy foods and alcohol.  Ask people who smoke not to smoke around you.  Have something planned to do right after eating or having a cup of coffee. For example, plan to take a walk or exercise.  Try a relaxation exercise to calm you down and decrease your stress. Remember, you may be tense and nervous for the first 2 weeks after you quit, but this will pass.  Find new activities to keep your hands busy. Play with a pen, coin, or rubber band. Doodle or draw things on paper.  Brush your teeth right after eating. This will help cut down on the craving for the taste of tobacco after meals. You can also try mouthwash.   Use oral substitutes in place of cigarettes. Try using lemon drops, carrots, cinnamon sticks, or chewing gum. Keep them handy so they are available when you have the urge to smoke.  When you have the urge to smoke, try deep breathing.  Designate your home as a nonsmoking area.  If you are a heavy smoker, ask your health care provider about a prescription for nicotine chewing gum. It can ease your withdrawal from nicotine.  Reward yourself. Set aside the cigarette money you save and buy yourself something nice.  Look for support from others. Join a support group or smoking cessation program. Ask someone at home or at work to help you with your plan   to quit smoking.  Always ask yourself, "Do I need this cigarette or is this just a reflex?" Tell yourself, "Today, I choose not to smoke," or "I do not want to smoke." You are reminding yourself of your decision to quit.  Do not replace cigarette smoking with electronic cigarettes (commonly called e-cigarettes). The safety of e-cigarettes is unknown, and some may contain harmful chemicals.  If you relapse, do not give up! Plan ahead and think about what you will do the next time you get the urge to smoke. HOW WILL I FEEL WHEN I QUIT SMOKING? You  may have symptoms of withdrawal because your body is used to nicotine (the addictive substance in cigarettes). You may crave cigarettes, be irritable, feel very hungry, cough often, get headaches, or have difficulty concentrating. The withdrawal symptoms are only temporary. They are strongest when you first quit but will go away within 10-14 days. When withdrawal symptoms occur, stay in control. Think about your reasons for quitting. Remind yourself that these are signs that your body is healing and getting used to being without cigarettes. Remember that withdrawal symptoms are easier to treat than the major diseases that smoking can cause.  Even after the withdrawal is over, expect periodic urges to smoke. However, these cravings are generally short lived and will go away whether you smoke or not. Do not smoke! WHAT RESOURCES ARE AVAILABLE TO HELP ME QUIT SMOKING? Your health care provider can direct you to community resources or hospitals for support, which may include:  Group support.  Education.  Hypnosis.  Therapy.   This information is not intended to replace advice given to you by your health care provider. Make sure you discuss any questions you have with your health care provider.   Document Released: 03/10/2004 Document Revised: 07/03/2014 Document Reviewed: 11/28/2012 Elsevier Interactive Patient Education 2016 [employer]  

## 2015-08-09 NOTE — Progress Notes (Signed)
[place] [phone]   Fax:([phone]) [place], MD [place] [address]  DIAGNOSIS: Limited stage small cell lung cancer diagnosed in October of 2012 .   PRIOR THERAPY:  1) Status post palliative radiotherapy to the right hilum under the care Dr. Valere Dross.  2) Systemic chemotherapy with carboplatin for AUC of 5 on day 1 and etoposide 120 mg/M2 on days 1,2 and 3 with Neulasta support on day 4, status post 6 cycles.  3) Prophylactic cranial irradiation under the care of Dr. Valere Dross.  4) Curative stereotactic radiotherapy to right lung nodule suspicious for a stage IA non-small cell lung cancer under the care of Dr. Valere Dross completed on 10/09/2012.   CURRENT THERAPY: Observation.  INTERVAL HISTORY: Janice Daniels 70 y.o. female returns to the clinic today for six-month followup visit. She denied having any significant weight loss or night sweats. She has no chest pain, shortness of breath with exertion, cough or hemoptysis. She has no nausea or vomiting, no fever or chills. She continues to have fatigue and generalized weakness. Unfortunately she continues to smoke and I strongly encouraged her to quit smoking. She had repeat CT scan of the chest performed recently and she is here for evaluation and discussion of her scan results.  MEDICAL HISTORY: Past Medical History  Diagnosis Date  . Hypertension   . Hypothyroid   . GERD (gastroesophageal reflux disease)   . COPD (chronic obstructive pulmonary disease) ([facility])   . Low back pain   . Lung cancer ([facility]) 05/09/2011    RUL  . Depression   . History of radiation therapy 06/05/11 to 07/18/11    lung  . History of radiation therapy 09/13/11 to 09/26/11    brain  . Shingles   . Lung cancer, upper lobe ([facility]) 09/02/2012    rul  . History of chemotherapy     carboplatin and etoposide  . Hx of radiation therapy 10/03/12 - 10/09/12    RUL lung  . Acute  on chronic systolic CHF (congestive heart failure) ([facility])     ALLERGIES:  is allergic to codeine.  MEDICATIONS:  Current Outpatient Prescriptions  Medication Sig Dispense Refill  . albuterol (VENTOLIN HFA) 108 (90 BASE) MCG/ACT inhaler Inhale 2 puffs into the lungs every 6 (six) hours as needed for wheezing or shortness of breath.     Marland Kitchen aspirin EC 81 MG EC tablet Take 1 tablet (81 mg total) by mouth daily.    . citalopram (CELEXA) 10 MG tablet Take 10 mg by mouth daily.      Marland Kitchen dextromethorphan-guaiFENesin (MUCINEX DM) 30-600 MG per 12 hr tablet Take 2 tablets by mouth 2 (two) times daily. 20 tablet 0  . gabapentin (NEURONTIN) 600 MG tablet Take 600 mg by mouth at bedtime. Per [facility] at Dr. Shon Baton office.    Marland Kitchen ipratropium (ATROVENT HFA) 17 MCG/ACT inhaler Inhale 2 puffs into the lungs every 6 (six) hours.    Marland Kitchen ipratropium-albuterol (DUONEB) 0.5-2.5 (3) MG/3ML SOLN Take 3 mLs by nebulization every 6 (six) hours. 360 mL 0  . Levetiracetam (KEPPRA XR) 750 MG TB24 Take 1 tablet (750 mg total) by mouth at bedtime. 30 tablet 11  . levothyroxine (SYNTHROID, LEVOTHROID) 125 MCG tablet Take 1 tablet (125 mcg total) by mouth daily. 30 tablet 0  . lisinopril (PRINIVIL,ZESTRIL) 2.5 MG tablet Take 1 tablet (2.5 mg total) by mouth 2 (two) times daily. 30 tablet 0  .  montelukast (SINGULAIR) 10 MG tablet Take 10 mg by mouth daily.    . simvastatin (ZOCOR) 20 MG tablet TAKE 1 TABLET(20 MG) BY MOUTH DAILY 30 tablet 6  . spironolactone (ALDACTONE) 25 MG tablet Take 0.5 tablets (12.5 mg total) by mouth daily. 45 tablet 3  . varenicline (CHANTIX) 1 MG tablet Take 1 mg by mouth daily.    . Vitamin D, Ergocalciferol, (DRISDOL) 50000 UNITS CAPS capsule Take 50,000 Units by mouth every 7 (seven) days. Monday     No current facility-administered medications for this visit.    SURGICAL HISTORY:  Past Surgical History  Procedure Laterality Date  . Tubal ligation  1984  . Total abdominal hysterectomy  1986  .  Tonsillectomy    . Endobronchial biopsy Right 05/03/2011    REVIEW OF SYSTEMS:  A comprehensive review of systems was negative except for: Constitutional: positive for fatigue Neurological: positive for weakness   PHYSICAL EXAMINATION: General appearance: alert, cooperative and no distress Head: Normocephalic, without obvious abnormality, atraumatic Neck: no adenopathy, no JVD, supple, symmetrical, trachea midline and thyroid not enlarged, symmetric, no tenderness/mass/nodules Lymph nodes: Cervical, supraclavicular, and axillary nodes normal. Resp: clear to auscultation bilaterally Back: symmetric, no curvature. ROM normal. No CVA tenderness. Cardio: regular rate and rhythm, S1, S2 normal, no murmur, click, rub or gallop GI: soft, non-tender; bowel sounds normal; no masses,  no organomegaly Extremities: extremities normal, atraumatic, no cyanosis or edema  ECOG PERFORMANCE STATUS: 1 - Symptomatic but completely ambulatory  Blood pressure 139/75, pulse 115, temperature 98.3 F (36.8 C), temperature source Oral, resp. rate 19, height '5\' 4"'$  (1.626 m), weight 120 lb 4.8 oz (54.568 kg).  LABORATORY DATA: Lab Results  Component Value Date   WBC 4.9 08/02/2015   HGB 13.9 08/02/2015   HCT 41.8 08/02/2015   MCV 92.9 08/02/2015   PLT 239 08/02/2015      Chemistry      Component Value Date/Time   NA 141 08/02/2015 1005   NA 140 12/09/2014 1125   NA 143 11/17/2011 0847   K 4.2 08/02/2015 1005   K 4.7 12/09/2014 1125   K 4.6 11/17/2011 0847   CL 103 12/09/2014 1125   CL 105 12/17/2012 1006   CL 101 11/17/2011 0847   CO2 26 08/02/2015 1005   CO2 19 12/09/2014 1125   CO2 30 11/17/2011 0847   BUN 6.6* 08/02/2015 1005   BUN 11 12/09/2014 1125   BUN 13 11/17/2011 0847   CREATININE 0.9 08/02/2015 1005   CREATININE 0.95 12/09/2014 1125   CREATININE 1.03* 11/06/2014 1200      Component Value Date/Time   CALCIUM 9.7 08/02/2015 1005   CALCIUM 10.0 12/09/2014 1125   CALCIUM 9.0  11/17/2011 0847   ALKPHOS 64 08/02/2015 1005   ALKPHOS 42 10/28/2014 0257   ALKPHOS 60 11/17/2011 0847   AST 15 08/02/2015 1005   AST 17 10/28/2014 0257   AST 23 11/17/2011 0847   ALT 11 08/02/2015 1005   ALT 10* 10/28/2014 0257   ALT 17 11/17/2011 0847   BILITOT 0.39 08/02/2015 1005   BILITOT 0.4 10/28/2014 0257   BILITOT 0.60 11/17/2011 0847       RADIOGRAPHIC STUDIES: Ct Chest W Contrast  08/02/2015  CLINICAL DATA:  Followup right upper lobe lung carcinoma. Status post radiation therapy and chemotherapy. Shortness of breath. EXAM: CT CHEST WITH CONTRAST TECHNIQUE: Multidetector CT imaging of the chest was performed during intravenous contrast administration. CONTRAST:  52m OMNIPAQUE IOHEXOL 300 MG/ML  SOLN COMPARISON:  01/25/2015 FINDINGS: Mediastinum/Lymph Nodes: No masses, pathologically enlarged lymph nodes, or other significant abnormality. Lungs/Pleura: Mild to moderate emphysema again noted. Pleural-parenchymal opacity in the posterior and central right upper lung field are again seen with associated central bronchiectasis. This is stable in appearance and consistent with post radiation changes. No new or worsening areas of pulmonary opacity are seen. No evidence of pleural effusion. Upper abdomen: No acute findings. Musculoskeletal: No chest wall mass or suspicious bone lesions identified. IMPRESSION: Stable post treatment changes in right upper lung field. Stable emphysema. No evidence of recurrent or metastatic carcinoma within the thorax. Electronically Signed   By: Earle Gell M.D.   On: 08/02/2015 12:17    ASSESSMENT AND PLAN: This is a very pleasant 70 years old white female with history of limited stage small cell lung cancer diagnosed in October of 2012 status post systemic chemotherapy as well as palliative radiotherapy to the right hilum with prophylactic cranial irradiation and curative radiotherapy to right lung nodule. Her recent CT scan of the chest showed no evidence  for disease progression. I discussed the scan results with the patient and recommended for her to continue on observation with repeat CT scan of the chest in 6 months For smoke cessation, I strongly encouraged the patient to quit smoking and offered her to smoke cessation program. The patient was also seen by the thoracic navigator for smoke cessation counseling. She was advised to call immediately if she has any concerning symptoms in the interval.  The patient voices understanding of current disease status and treatment options and is in agreement with the current care plan.  All questions were answered. The patient knows to call the clinic with any problems, questions or concerns. We can certainly see the patient much sooner if necessary.  Disclaimer: This note was dictated with voice recognition software. Similar sounding words can inadvertently be transcribed and may not be corrected upon review.

## 2015-08-09 NOTE — Telephone Encounter (Signed)
per pof to sch pt appt-gave pt opy of avs

## 2015-08-20 ENCOUNTER — Telehealth: Payer: Self-pay | Admitting: Pulmonary Disease

## 2015-08-20 NOTE — Telephone Encounter (Signed)
Rec'd request for records from [facility] I faxed 15 pages of office notes to their office and received confirmation from fax that it finally went through

## 2015-09-30 DIAGNOSIS — R062 Wheezing: Secondary | ICD-10-CM | POA: Diagnosis not present

## 2015-09-30 DIAGNOSIS — J449 Chronic obstructive pulmonary disease, unspecified: Secondary | ICD-10-CM | POA: Diagnosis not present

## 2015-09-30 DIAGNOSIS — Z6820 Body mass index (BMI) 20.0-20.9, adult: Secondary | ICD-10-CM | POA: Diagnosis not present

## 2015-09-30 DIAGNOSIS — I10 Essential (primary) hypertension: Secondary | ICD-10-CM | POA: Diagnosis not present

## 2015-09-30 DIAGNOSIS — K5909 Other constipation: Secondary | ICD-10-CM | POA: Diagnosis not present

## 2015-09-30 DIAGNOSIS — E038 Other specified hypothyroidism: Secondary | ICD-10-CM | POA: Diagnosis not present

## 2015-09-30 DIAGNOSIS — R0609 Other forms of dyspnea: Secondary | ICD-10-CM | POA: Diagnosis not present

## 2015-10-21 DIAGNOSIS — J9601 Acute respiratory failure with hypoxia: Secondary | ICD-10-CM | POA: Diagnosis not present

## 2015-10-21 DIAGNOSIS — Z6820 Body mass index (BMI) 20.0-20.9, adult: Secondary | ICD-10-CM | POA: Diagnosis not present

## 2015-10-21 DIAGNOSIS — R0609 Other forms of dyspnea: Secondary | ICD-10-CM | POA: Diagnosis not present

## 2015-10-21 DIAGNOSIS — J449 Chronic obstructive pulmonary disease, unspecified: Secondary | ICD-10-CM | POA: Diagnosis not present

## 2015-10-21 DIAGNOSIS — R112 Nausea with vomiting, unspecified: Secondary | ICD-10-CM | POA: Diagnosis not present

## 2015-10-21 DIAGNOSIS — I10 Essential (primary) hypertension: Secondary | ICD-10-CM | POA: Diagnosis not present

## 2015-10-21 DIAGNOSIS — F17200 Nicotine dependence, unspecified, uncomplicated: Secondary | ICD-10-CM | POA: Diagnosis not present

## 2015-10-21 DIAGNOSIS — E038 Other specified hypothyroidism: Secondary | ICD-10-CM | POA: Diagnosis not present

## 2015-10-28 ENCOUNTER — Other Ambulatory Visit: Payer: Self-pay | Admitting: Internal Medicine

## 2015-10-28 ENCOUNTER — Other Ambulatory Visit (HOSPITAL_COMMUNITY): Payer: Self-pay | Admitting: Internal Medicine

## 2015-10-28 DIAGNOSIS — R062 Wheezing: Secondary | ICD-10-CM | POA: Diagnosis not present

## 2015-10-28 DIAGNOSIS — J9601 Acute respiratory failure with hypoxia: Secondary | ICD-10-CM

## 2015-10-28 DIAGNOSIS — Z6820 Body mass index (BMI) 20.0-20.9, adult: Secondary | ICD-10-CM | POA: Diagnosis not present

## 2015-10-28 DIAGNOSIS — C3490 Malignant neoplasm of unspecified part of unspecified bronchus or lung: Secondary | ICD-10-CM | POA: Diagnosis not present

## 2015-11-02 ENCOUNTER — Encounter (HOSPITAL_COMMUNITY): Payer: Self-pay

## 2015-11-02 ENCOUNTER — Ambulatory Visit (HOSPITAL_COMMUNITY)
Admission: RE | Admit: 2015-11-02 | Discharge: 2015-11-02 | Disposition: A | Discharge status: Home / Self Care | Payer: PPO | Source: Ambulatory Visit | Attending: Internal Medicine | Admitting: Internal Medicine

## 2015-11-02 DIAGNOSIS — R0600 Dyspnea, unspecified: Secondary | ICD-10-CM | POA: Diagnosis not present

## 2015-11-02 DIAGNOSIS — C3490 Malignant neoplasm of unspecified part of unspecified bronchus or lung: Secondary | ICD-10-CM | POA: Insufficient documentation

## 2015-11-02 DIAGNOSIS — J439 Emphysema, unspecified: Secondary | ICD-10-CM | POA: Diagnosis not present

## 2015-11-02 DIAGNOSIS — J9601 Acute respiratory failure with hypoxia: Secondary | ICD-10-CM | POA: Insufficient documentation

## 2015-11-02 MED ORDER — IOPAMIDOL (ISOVUE-300) INJECTION 61%
75.0000 mL | Freq: Once | INTRAVENOUS | Status: AC | PRN
  Administered 2015-11-02: 75 mL via INTRAVENOUS

## 2015-11-04 ENCOUNTER — Other Ambulatory Visit: Payer: Self-pay | Admitting: Cardiology

## 2015-11-04 NOTE — Telephone Encounter (Signed)
Rx refill sent to pharmacy. 

## 2015-11-10 ENCOUNTER — Encounter: Payer: Self-pay | Admitting: Neurology

## 2015-11-10 ENCOUNTER — Ambulatory Visit (INDEPENDENT_AMBULATORY_CARE_PROVIDER_SITE_OTHER): Payer: PPO | Admitting: Neurology

## 2015-11-10 VITALS — BP 136/83 | HR 111 | Ht 64.0 in | Wt 121.2 lb

## 2015-11-10 DIAGNOSIS — I10 Essential (primary) hypertension: Secondary | ICD-10-CM | POA: Diagnosis not present

## 2015-11-10 DIAGNOSIS — I739 Peripheral vascular disease, unspecified: Secondary | ICD-10-CM

## 2015-11-10 DIAGNOSIS — C3490 Malignant neoplasm of unspecified part of unspecified bronchus or lung: Secondary | ICD-10-CM

## 2015-11-10 DIAGNOSIS — R569 Unspecified convulsions: Secondary | ICD-10-CM | POA: Diagnosis not present

## 2015-11-10 DIAGNOSIS — I999 Unspecified disorder of circulatory system: Secondary | ICD-10-CM

## 2015-11-10 DIAGNOSIS — J432 Centrilobular emphysema: Secondary | ICD-10-CM | POA: Diagnosis not present

## 2015-11-10 MED ORDER — LEVETIRACETAM ER 750 MG PO TB24: 750.0000 mg | ORAL_TABLET | Freq: Every day | ORAL | Status: DC

## 2015-11-10 NOTE — Progress Notes (Signed)
Chief Complaint  Patient presents with  . Seizures    She is here with her husband, Janice Daniels. She is no longer taking Keppra.  Says she has not had any further events.  . Memory Loss    MMSE - animals.  Feels memory to be about the same.      PATIENT: Janice Daniels DOB: 10-31-1945  Chief Complaint  Patient presents with  . Seizures    She is here with her husband, Janice Daniels. She is no longer taking Keppra.  Says she has not had any further events.  . Memory Loss    MMSE - animals.  Feels memory to be about the same.     HISTORICAL  Janice Daniels is a 70 years old right-handed female, seen in refer by her primary care physician Dr. Bevelyn Buckles for evaluation of abnormal MRI of the brain in February 15 2015.  I have reviewed and summarized most recent office visit February 03 2015.  She has history of COPD, ongoing tobacco use, right lung cancer, small cell stage III , status post palliative radiation to right hand hilum lung cancer, systemic chemotherapy, prophylactic cranial radiation therapy,  history of congestive heart failure,  hypothyroidism, on supplement  Since May 2016, she began to complains of worsening gait difficulty, she fell few times, left leg tends to give out underneath her, she denied loss of consciousness, but there was few episode, she felt that her eyes were rolling backwards, she also reported episodes of uncontrollable left leg jerking, lasting for a few minutes, without loss of consciousness, without spreading to her left arm, she is a poor historian, alone at visit, could not elaborate on details, also complains of memory trouble  I have personally reviewed MRI of the brain with without contrast February 01 2015: Generalized atrophy, with extensive confluent white matter hyperintensity, likely represent post radiation effect contrast enhancing lesion in the right basal ganglion, suggest a subacute hemorrhagic infarction  UPDATE Apr 13 2015: She was  started on Keppra 250 mg twice a day, suspicious for probable complex partial seizure  Her husband is with her at today's visit, reported 6-8 similar transient confusion, falling episodes since May 2016, husband also reported episode of eyes rolled back, body twitching, left leg jerking, transient loss of consciousness, lasting for few minutes, followed by confusion,   EEG was normal in September 2016  She was able to tolerate Keppra 250 mg twice a day, while she was taking Keppra, she has no seizure-like episode, but she had a few days not taking her Keppra, she began to have recurrent left leg jerking, unexpected falling.  UPDATE May 17th 2017: She had a history of right lower thoracic shingles in the past, was taking gabapentin 600 mg once a day,  Patient symptoms to be irritable at today's clinical visit, her husband reported that the most recent confusion episode was in February 2017, patient refused to take [place], denies recurrent seizure, she continued to smoke, shortness of breath with minimum exertion, she has not driven yet  Have reviewed laboratory evaluation in February 2017, normal CBC CMP REVIEW OF SYSTEMS: Full 14 system review of systems performed and notable only for back pain, cough, shortness of breath ALLERGIES: Allergies  Allergen Reactions  . Codeine Other (See Comments)    Elevated pulse    HOME MEDICATIONS: Current Outpatient Prescriptions  Medication Sig Dispense Refill  . ADVAIR DISKUS 250-50 MCG/DOSE AEPB Inhale 1 puff into the lungs 2 (two) times daily.  2  . albuterol (VENTOLIN HFA) 108 (90 BASE) MCG/ACT inhaler Inhale 2 puffs into the lungs every 6 (six) hours as needed for wheezing or shortness of breath.     Marland Kitchen aspirin EC 81 MG EC tablet Take 1 tablet (81 mg total) by mouth daily.    Marland Kitchen ipratropium (ATROVENT HFA) 17 MCG/ACT inhaler Inhale 2 puffs into the lungs every 6 (six) hours.    Marland Kitchen ipratropium-albuterol (DUONEB) 0.5-2.5 (3) MG/3ML SOLN Take 3 mLs by  nebulization every 6 (six) hours. 360 mL 0  . levothyroxine (SYNTHROID, LEVOTHROID) 75 MCG tablet Take 75 mcg by mouth daily.  3  . simvastatin (ZOCOR) 20 MG tablet TAKE 1 TABLET(20 MG) BY MOUTH DAILY 30 tablet 6  . spironolactone (ALDACTONE) 25 MG tablet TAKE 1/2 TABLET(12.5 MG) BY MOUTH DAILY 45 tablet 0  . Vitamin D, Ergocalciferol, (DRISDOL) 50000 UNITS CAPS capsule Take 50,000 Units by mouth every 7 (seven) days. Monday     No current facility-administered medications for this visit.    PAST MEDICAL HISTORY: Past Medical History  Diagnosis Date  . Hypertension   . Hypothyroid   . GERD (gastroesophageal reflux disease)   . COPD (chronic obstructive pulmonary disease) ([place])   . Low back pain   . Depression   . History of radiation therapy 06/05/11 to 07/18/11    lung  . History of radiation therapy 09/13/11 to 09/26/11    brain  . Shingles   . History of chemotherapy     carboplatin and etoposide  . Hx of radiation therapy 10/03/12 - 10/09/12    RUL lung  . Acute on chronic systolic CHF (congestive heart failure) ([place])   . Lung cancer ([place]) 05/09/2011    RUL  . Lung cancer, upper lobe ([place]) 09/02/2012    rul    PAST SURGICAL HISTORY: Past Surgical History  Procedure Laterality Date  . Tubal ligation  1984  . Total abdominal hysterectomy  1986  . Tonsillectomy    . Endobronchial biopsy Right 05/03/2011    FAMILY HISTORY: Family History  Problem Relation Age of Onset  . Esophageal cancer Father   . Hypothyroidism Father   . Cancer Father     esophageal  . Heart disease Mother   . Hyperlipidemia Mother   . Hypertension Mother     heart disease  . Other Brother     low back pain  . Cancer Brother     esophageal  . Other Brother     hypochondriasis  . Other Brother     suicide  . Esophageal cancer Brother   . Cervical cancer Sister   . Cancer Sister     cervical    SOCIAL HISTORY:  Social History   Social History  . Marital Status: Married    Spouse  Name: Janice Daniels  . Number of Children: 5  . Years of Education: 12   Occupational History  . retired Quarry manager    Social History Main Topics  . Smoking status: Current Some Day Smoker -- 1.00 packs/day for 50 years    Types: Cigarettes    Last Attempt to Quit: 11/22/2014  . Smokeless tobacco: Never Used     Comment: Smokes 1 pack per week  . Alcohol Use: No  . Drug Use: No  . Sexual Activity: Not on file   Other Topics Concern  . Not on file   Social History Narrative   She is married to [place] and had 5 sons.  She had a  daughter who died at 73 months.  Her son, Heron Sabins, died at age 2 from alcoholism and pancreas disease.  Her son, Merry Proud, is 30 was born 40 and is alive and well.  Her son, Clair Gulling, is 79, born [birth year], and has bipolar disorder.  Her son, Jenny Reichmann, is 59, born [birth year], and has kidney disease.  Her son, Corene Cornea, is age 96, born [birth year], and is in the [place].    Right-handed.   2-4 cups caffeine per day.     PHYSICAL EXAM   Filed Vitals:   11/10/15 1140  BP: 136/83  Pulse: 111  Height: '5\' 4"'$  (1.626 m)  Weight: 121 lb 4 oz (54.999 kg)    Not recorded      Body mass index is 20.8 kg/(m^2).  PHYSICAL EXAMNIATION:  Gen: NAD, conversant, well nourised, obese, well groomed                     Cardiovascular: Regular rate rhythm, no peripheral edema, warm, nontender. Eyes: Conjunctivae clear without exudates or hemorrhage Neck: Supple, no carotid bruise. Pulmonary: Wheezing sound, decreased bowel movement at the base of her long  NEUROLOGICAL EXAM:  MENTAL STATUS: Speech:    Speech is normal; fluent and spontaneous with normal comprehension.  Cognition: Mini-Mental Status Examination is 28 out of 30, animal naming is 15     Orientation to time, place and person she is not oriented to date:      Recent and remote memory: She missed 2 out of 3 recalls     Normal Attention span and concentration     Normal Language, naming, repeating,spontaneous speech     Fund of  knowledge   CRANIAL NERVES: CN II: Visual fields are full to confrontation. Fundoscopic exam is normal with sharp discs and no vascular changes. Pupils are round equal and briskly reactive to light. CN III, IV, VI: extraocular movement are normal. No ptosis. CN V: Facial sensation is intact to pinprick in all 3 divisions bilaterally. Corneal responses are intact.  CN VII: Face is symmetric with normal eye closure and smile. CN VIII: Hearing is normal to rubbing fingers CN IX, X: Palate elevates symmetrically. Phonation is normal. CN XI: Head turning and shoulder shrug are intact CN XII: Tongue is midline with normal movements and no atrophy.  MOTOR: She has mild fixation of left arm up on rapid rotating movement, mild left hip flexion, ankle dorsiflexion weakness  REFLEXES: Reflexes are 2+ and symmetric at the biceps, triceps, knees, and ankles. Plantar responses are flexor.  SENSORY: Intact to light touch, pinprick, position sense, and vibration sense are intact in fingers and toes.  COORDINATION: Rapid alternating movements and fine finger movements are intact. There is no dysmetria on finger-to-nose and heel-knee-shin.    GAIT/STANCE: Wide based cautious unsteady gait,   DIAGNOSTIC DATA (LABS, IMAGING, TESTING) - I reviewed patient records, labs, notes, testing and imaging myself where available.   ASSESSMENT AND PLAN  AILYN GLADD is a 70 y.o. female   Complex partial seizure  We again personally reviewed her MRI of the brain in August 2016: Evidence of significant atrophy, deep white matter confluent disease, right basal ganglion acute hemorrhagic stroke  I have refilled her Keppra xr 750 every night  Advise her document all event, no driving until seizure free for 6 months   Memory loss  Mini-Mental Status Examination is 28 out of 30   This is related to her generalized brain atrophy, extensive periventricular  white matter disease Gait difficulty  most  consistent with her extensive periventricular white matter changes, nystatin lung disease, shortness of breath with minimum exertion  Marcial Pacas, M.D. Ph.D.  [facility] [address] [place], [place] [address] Ph: [phone] Fax: [phone]  CC: Dr. Bevelyn Buckles

## 2015-11-11 ENCOUNTER — Telehealth: Payer: Self-pay | Admitting: *Deleted

## 2015-11-11 NOTE — Telephone Encounter (Signed)
Janice Daniels [phone] Janice Daniels [phone] * [phone] CAN YOU MAIL RESULTS OF BRAIN SCAN WITH EXPLANATION. PCB TO DISCUSS N  Pt need to call Dr. Sharlett Iles office for mri report.

## 2015-11-18 ENCOUNTER — Ambulatory Visit: Payer: PPO | Admitting: Pulmonary Disease

## 2015-11-29 DIAGNOSIS — C3490 Malignant neoplasm of unspecified part of unspecified bronchus or lung: Secondary | ICD-10-CM | POA: Diagnosis not present

## 2015-11-29 DIAGNOSIS — Z6821 Body mass index (BMI) 21.0-21.9, adult: Secondary | ICD-10-CM | POA: Diagnosis not present

## 2015-11-29 DIAGNOSIS — I10 Essential (primary) hypertension: Secondary | ICD-10-CM | POA: Diagnosis not present

## 2015-11-29 DIAGNOSIS — F17200 Nicotine dependence, unspecified, uncomplicated: Secondary | ICD-10-CM | POA: Diagnosis not present

## 2015-11-29 DIAGNOSIS — R0609 Other forms of dyspnea: Secondary | ICD-10-CM | POA: Diagnosis not present

## 2015-11-29 DIAGNOSIS — J9601 Acute respiratory failure with hypoxia: Secondary | ICD-10-CM | POA: Diagnosis not present

## 2015-11-29 DIAGNOSIS — I629 Nontraumatic intracranial hemorrhage, unspecified: Secondary | ICD-10-CM | POA: Diagnosis not present

## 2015-11-29 DIAGNOSIS — J449 Chronic obstructive pulmonary disease, unspecified: Secondary | ICD-10-CM | POA: Diagnosis not present

## 2015-12-31 DIAGNOSIS — C3490 Malignant neoplasm of unspecified part of unspecified bronchus or lung: Secondary | ICD-10-CM | POA: Diagnosis not present

## 2015-12-31 DIAGNOSIS — Z6821 Body mass index (BMI) 21.0-21.9, adult: Secondary | ICD-10-CM | POA: Diagnosis not present

## 2015-12-31 DIAGNOSIS — J9601 Acute respiratory failure with hypoxia: Secondary | ICD-10-CM | POA: Diagnosis not present

## 2015-12-31 DIAGNOSIS — M545 Low back pain: Secondary | ICD-10-CM | POA: Diagnosis not present

## 2015-12-31 DIAGNOSIS — J449 Chronic obstructive pulmonary disease, unspecified: Secondary | ICD-10-CM | POA: Diagnosis not present

## 2016-02-02 ENCOUNTER — Other Ambulatory Visit (HOSPITAL_COMMUNITY): Payer: Self-pay | Admitting: Cardiology

## 2016-02-02 NOTE — Telephone Encounter (Signed)
Rx(s) sent to pharmacy electronically.  

## 2016-02-07 ENCOUNTER — Encounter (HOSPITAL_COMMUNITY): Payer: Self-pay

## 2016-02-07 ENCOUNTER — Ambulatory Visit (HOSPITAL_COMMUNITY)
Admission: RE | Admit: 2016-02-07 | Discharge: 2016-02-07 | Disposition: A | Discharge status: Home / Self Care | Payer: PPO | Source: Ambulatory Visit | Attending: Internal Medicine | Admitting: Internal Medicine

## 2016-02-07 ENCOUNTER — Other Ambulatory Visit (HOSPITAL_BASED_OUTPATIENT_CLINIC_OR_DEPARTMENT_OTHER): Payer: PPO

## 2016-02-07 DIAGNOSIS — I700 Atherosclerosis of aorta: Secondary | ICD-10-CM | POA: Insufficient documentation

## 2016-02-07 DIAGNOSIS — C3411 Malignant neoplasm of upper lobe, right bronchus or lung: Secondary | ICD-10-CM

## 2016-02-07 DIAGNOSIS — Z923 Personal history of irradiation: Secondary | ICD-10-CM | POA: Insufficient documentation

## 2016-02-07 DIAGNOSIS — Z85118 Personal history of other malignant neoplasm of bronchus and lung: Secondary | ICD-10-CM

## 2016-02-07 DIAGNOSIS — J439 Emphysema, unspecified: Secondary | ICD-10-CM | POA: Insufficient documentation

## 2016-02-07 DIAGNOSIS — J181 Lobar pneumonia, unspecified organism: Secondary | ICD-10-CM | POA: Diagnosis not present

## 2016-02-07 DIAGNOSIS — I2510 Atherosclerotic heart disease of native coronary artery without angina pectoris: Secondary | ICD-10-CM | POA: Insufficient documentation

## 2016-02-07 LAB — COMPREHENSIVE METABOLIC PANEL
ALBUMIN: 3.9 g/dL (ref 3.5–5.0)
ALK PHOS: 72 U/L (ref 40–150)
AST: 16 U/L (ref 5–34)
Anion Gap: 8 mEq/L (ref 3–11)
BUN: 7 mg/dL (ref 7.0–26.0)
CO2: 30 mEq/L — ABNORMAL HIGH (ref 22–29)
CREATININE: 0.9 mg/dL (ref 0.6–1.1)
Calcium: 9.6 mg/dL (ref 8.4–10.4)
Chloride: 102 mEq/L (ref 98–109)
EGFR: 63 mL/min/{1.73_m2} — ABNORMAL LOW (ref >90)
GLUCOSE: 89 mg/dL (ref 70–140)
POTASSIUM: 3.9 meq/L (ref 3.5–5.1)
SODIUM: 140 meq/L (ref 136–145)
TOTAL PROTEIN: 7.5 g/dL (ref 6.4–8.3)
Total Bilirubin: 0.51 mg/dL (ref 0.20–1.20)

## 2016-02-07 LAB — CBC WITH DIFFERENTIAL/PLATELET
BASO%: 1 % (ref 0.0–2.0)
Basophils Absolute: 0.1 10*3/uL (ref 0.0–0.1)
EOS%: 1.3 % (ref 0.0–7.0)
Eosinophils Absolute: 0.1 10*3/uL (ref 0.0–0.5)
HEMATOCRIT: 44.5 % (ref 34.8–46.6)
HEMOGLOBIN: 14.6 g/dL (ref 11.6–15.9)
LYMPH#: 0.8 10*3/uL — AB (ref 0.9–3.3)
LYMPH%: 11.6 % — ABNORMAL LOW (ref 14.0–49.7)
MCH: 32.6 pg (ref 25.1–34.0)
MCHC: 32.9 g/dL (ref 31.5–36.0)
MCV: 99 fL (ref 79.5–101.0)
MONO#: 0.7 10*3/uL (ref 0.1–0.9)
MONO%: 10.1 % (ref 0.0–14.0)
NEUT%: 76 % (ref 38.4–76.8)
NEUTROS ABS: 5.4 10*3/uL (ref 1.5–6.5)
Platelets: 291 10*3/uL (ref 145–400)
RBC: 4.49 10*6/uL (ref 3.70–5.45)
RDW: 13.7 % (ref 11.2–14.5)
WBC: 7.1 10*3/uL (ref 3.9–10.3)

## 2016-02-07 MED ORDER — IOPAMIDOL (ISOVUE-300) INJECTION 61%
75.0000 mL | Freq: Once | INTRAVENOUS | Status: AC | PRN
  Administered 2016-02-07: 75 mL via INTRAVENOUS

## 2016-02-14 ENCOUNTER — Encounter: Payer: Self-pay | Admitting: Internal Medicine

## 2016-02-14 ENCOUNTER — Ambulatory Visit (HOSPITAL_BASED_OUTPATIENT_CLINIC_OR_DEPARTMENT_OTHER): Payer: PPO | Admitting: Internal Medicine

## 2016-02-14 ENCOUNTER — Telehealth: Payer: Self-pay | Admitting: Internal Medicine

## 2016-02-14 VITALS — BP 154/97 | HR 108 | Temp 97.8°F | Resp 19 | Ht 64.0 in | Wt 124.2 lb

## 2016-02-14 DIAGNOSIS — C3411 Malignant neoplasm of upper lobe, right bronchus or lung: Secondary | ICD-10-CM

## 2016-02-14 DIAGNOSIS — Z85118 Personal history of other malignant neoplasm of bronchus and lung: Secondary | ICD-10-CM | POA: Diagnosis not present

## 2016-02-14 DIAGNOSIS — Z720 Tobacco use: Secondary | ICD-10-CM | POA: Diagnosis not present

## 2016-02-14 NOTE — Progress Notes (Signed)
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   PRIOR THERAPY:  1) Status post palliative radiotherapy to the right hilum under the care Dr. Valere Dross.  2) Systemic chemotherapy with carboplatin for AUC of 5 on day 1 and etoposide 120 mg/M2 on days 1,2 and 3 with Neulasta support on day 4, status post 6 cycles.  3) Prophylactic cranial irradiation under the care of Dr. Valere Dross.  4) Curative stereotactic radiotherapy to right lung nodule suspicious for a stage IA non-small cell lung cancer under the care of Dr. Valere Dross completed on 10/09/2012.   CURRENT THERAPY: Observation.  INTERVAL HISTORY: Janice Daniels 70 y.o. female returns to the clinic today for six-month followup visit accompanied by her husband. The patient continues to have increasing fatigue and weakness as well as decreased mobility. She used a cane for walking. She is followed by neurology and was diagnosed with questionable stroke. She denied having any significant weight loss or night sweats. She has no chest pain, shortness of breath with exertion, cough or hemoptysis. She has no nausea or vomiting, no fever or chills. She continues to smoke 0.5 pack per day and I strongly encouraged her to quit smoking. She had repeat CT scan of the chest performed recently and she is here for evaluation and discussion of her scan results.  MEDICAL HISTORY: Past Medical History:  Diagnosis Date  . Acute on chronic systolic CHF (congestive heart failure) ([place])   . COPD (chronic obstructive pulmonary disease) ([place])   . Depression   . GERD (gastroesophageal reflux disease)   . History of chemotherapy    carboplatin and etoposide  . History of radiation therapy 06/05/11 to 07/18/11   lung  . History of radiation therapy 09/13/11 to 09/26/11   brain  . Hx of radiation therapy 10/03/12 - 10/09/12   RUL lung  . Hypertension   . Hypothyroid   . Low back pain   . Lung cancer ([place]) 05/09/2011   RUL  . Lung cancer, upper lobe ([facility]) 09/02/2012   rul  . Shingles     ALLERGIES:  is allergic to codeine.  MEDICATIONS:  Current Outpatient Prescriptions  Medication Sig Dispense Refill  . ADVAIR DISKUS 250-50 MCG/DOSE AEPB Inhale 1 puff into the lungs 2 (two) times daily.  2  . albuterol (VENTOLIN HFA) 108 (90 BASE) MCG/ACT inhaler Inhale 2 puffs into the lungs every 6 (six) hours as needed for wheezing or shortness of breath.     Marland Kitchen aspirin EC 81 MG EC tablet Take 1 tablet (81 mg total) by mouth daily.    Marland Kitchen ipratropium (ATROVENT HFA) 17 MCG/ACT inhaler Inhale 2 puffs into the lungs every 6 (six) hours.    Marland Kitchen ipratropium-albuterol (DUONEB) 0.5-2.5 (3) MG/3ML SOLN Take 3 mLs by nebulization every 6 (six) hours. 360 mL 0  . Levetiracetam (KEPPRA XR) 750 MG TB24 Take 1 tablet (750 mg total) by mouth at bedtime. 60 tablet 11  . levothyroxine (SYNTHROID, LEVOTHROID) 75 MCG tablet Take 75 mcg by mouth daily.  3  . simvastatin (ZOCOR) 20 MG tablet Take 1 tablet (20 mg total) by mouth daily. PLEASE CONTACT OFFICE FOR ADDITIONAL REFILLS 30 tablet 0  . spironolactone (ALDACTONE) 25 MG tablet TAKE 1/2 TABLET(12.5 MG) BY MOUTH DAILY 45 tablet 0  . Vitamin D, Ergocalciferol, (  DRISDOL) 50000 UNITS CAPS capsule Take 50,000 Units by mouth every 7 (seven) days. Monday     No current facility-administered medications for this visit.     SURGICAL HISTORY:  Past Surgical History:  Procedure Laterality Date  . endobronchial biopsy Right 05/03/2011  . TONSILLECTOMY    . TOTAL ABDOMINAL HYSTERECTOMY  1986  . TUBAL LIGATION  1984    REVIEW OF SYSTEMS:  A comprehensive review of systems was negative except for: Constitutional: positive for fatigue Neurological: positive for weakness   PHYSICAL EXAMINATION: General appearance: alert, cooperative and no  distress Head: Normocephalic, without obvious abnormality, atraumatic Neck: no adenopathy, no JVD, supple, symmetrical, trachea midline and thyroid not enlarged, symmetric, no tenderness/mass/nodules Lymph nodes: Cervical, supraclavicular, and axillary nodes normal. Resp: clear to auscultation bilaterally Back: symmetric, no curvature. ROM normal. No CVA tenderness. Cardio: regular rate and rhythm, S1, S2 normal, no murmur, click, rub or gallop GI: soft, non-tender; bowel sounds normal; no masses,  no organomegaly Extremities: extremities normal, atraumatic, no cyanosis or edema  ECOG PERFORMANCE STATUS: 2 - Symptomatic, <50% confined to bed  Blood pressure (!) 154/97, pulse (!) 108, temperature 97.8 F (36.6 C), temperature source Oral, resp. rate 19, height '5\' 4"'$  (1.626 m), weight 124 lb 3.2 oz (56.3 kg), SpO2 99 %.  LABORATORY DATA: Lab Results  Component Value Date   WBC 7.1 02/07/2016   HGB 14.6 02/07/2016   HCT 44.5 02/07/2016   MCV 99.0 02/07/2016   PLT 291 02/07/2016      Chemistry      Component Value Date/Time   NA 140 02/07/2016 1116   K 3.9 02/07/2016 1116   CL 103 12/09/2014 1125   CL 105 12/17/2012 1006   CO2 30 (H) 02/07/2016 1116   BUN 7.0 02/07/2016 1116   CREATININE 0.9 02/07/2016 1116      Component Value Date/Time   CALCIUM 9.6 02/07/2016 1116   ALKPHOS 72 02/07/2016 1116   AST 16 02/07/2016 1116   ALT <9 02/07/2016 1116   BILITOT 0.51 02/07/2016 1116       RADIOGRAPHIC STUDIES: Ct Chest W Contrast  Result Date: 02/07/2016 CLINICAL DATA:  Right upper lobe lung cancer aerated diagnosed 2012 treated with radiation therapy and chemotherapy. Restaging. Patient reports shortness of breath. EXAM: CT CHEST WITH CONTRAST TECHNIQUE: Multidetector CT imaging of the chest was performed during intravenous contrast administration. CONTRAST:  76m ISOVUE-300 IOPAMIDOL (ISOVUE-300) INJECTION 61% COMPARISON:  11/02/2015 chest CT. FINDINGS: Mediastinum/Nodes:  Normal heart size. No significant pericardial fluid/thickening. Left anterior descending and right coronary atherosclerosis. Atherosclerotic nonaneurysmal thoracic aorta. Normal caliber pulmonary arteries. No central pulmonary emboli. No discrete thyroid nodules. Unremarkable esophagus. No pathologically enlarged axillary, mediastinal or hilar lymph nodes. Lungs/Pleura: No pneumothorax. No pleural effusion. Mild to moderate centrilobular and paraseptal emphysema with mild diffuse bronchial wall thickening. Basilar left lower lobe 3 mm pulmonary nodule (series 5/ image 123) is stable since at least 2014 and considered benign. There is a masslike focus of consolidation in the posterior right upper lobe measuring 5.0 x 2.6 cm (series 5/ image 27), which has slowly increased on multiple prior CT studies, previously 4.9 x 2.4 cm on 11/02/2015 and 4.4 x 2.0 cm on 01/25/2015. Similarly, there is a right upper parahilar masslike focus of consolidation measuring 4.1 x 3.4 cm (series 5/image 49) with associated volume loss and distortion, which has slowly increased on multiple prior CT studies, previously 3.6 x 3.2 cm on 11/02/2015. No acute consolidative airspace disease or new significant pulmonary  nodules. Upper abdomen: Unremarkable. Musculoskeletal: No aggressive appearing focal osseous lesions. Mild thoracic spondylosis. IMPRESSION: 1. Masslike foci of consolidation in the right upper parahilar lung and posterior right upper lobe have slowly increased on multiple prior chest CT studies. This is a nonspecific finding that could represent recurrent tumor versus continued evolution of radiation fibrosis. Correlation with PET-CT is advised. 2. No thoracic adenopathy. 3. Additional findings include aortic atherosclerosis, 2 vessel coronary atherosclerosis and mild to moderate emphysema. Electronically Signed   By: Ilona Sorrel M.D.   On: 02/07/2016 14:47    ASSESSMENT AND PLAN: This is a very pleasant 70 years old white  female with history of limited stage small cell lung cancer diagnosed in October of 2012 status post systemic chemotherapy as well as palliative radiotherapy to the right hilum with prophylactic cranial irradiation and curative radiotherapy to right lung nodule. Her recent CT scan of the chest showed no evidence for disease progression except for the evolving radiation changes and masslike consolidation in the right upper parahilar lung and posterior right upper lobe. This has been evaluated with a PET scan in the past and showed no hypermetabolic activity. I discussed the scan results with the patient and recommended for her to continue on observation. I gave her the option to come back for follow-up visit in 4 months for evaluation with repeat scan but the patient would like to continue with her routine six-month visit. She was advised to call immediately if she has any concerning respiratory symptoms. For smoke cessation, I strongly encouraged the patient to quit smoking and offered her to smoke cessation program. The patient was also seen by the thoracic navigator for smoke cessation counseling. She was advised to call immediately if she has any concerning symptoms in the interval.  The patient voices understanding of current disease status and treatment options and is in agreement with the current care plan.  All questions were answered. The patient knows to call the clinic with any problems, questions or concerns. We can certainly see the patient much sooner if necessary.  Disclaimer: This note was dictated with voice recognition software. Similar sounding words can inadvertently be transcribed and may not be corrected upon review.

## 2016-02-14 NOTE — Telephone Encounter (Signed)
GAVE PATIENT AVS REPORT AND APPOINTMENT FOR February 2018. [facility] SCHEDULING WILL CALL RE SCANS - PATIENT AWARE.

## 2016-02-21 ENCOUNTER — Other Ambulatory Visit: Payer: Self-pay | Admitting: Cardiology

## 2016-02-21 NOTE — Telephone Encounter (Signed)
Rx(s) sent to pharmacy electronically.  

## 2016-03-30 DIAGNOSIS — N179 Acute kidney failure, unspecified: Secondary | ICD-10-CM | POA: Diagnosis not present

## 2016-03-30 DIAGNOSIS — E784 Other hyperlipidemia: Secondary | ICD-10-CM | POA: Diagnosis not present

## 2016-03-30 DIAGNOSIS — E038 Other specified hypothyroidism: Secondary | ICD-10-CM | POA: Diagnosis not present

## 2016-03-30 DIAGNOSIS — M859 Disorder of bone density and structure, unspecified: Secondary | ICD-10-CM | POA: Diagnosis not present

## 2016-04-06 DIAGNOSIS — Z1389 Encounter for screening for other disorder: Secondary | ICD-10-CM | POA: Diagnosis not present

## 2016-04-06 DIAGNOSIS — R2681 Unsteadiness on feet: Secondary | ICD-10-CM | POA: Diagnosis not present

## 2016-04-06 DIAGNOSIS — E038 Other specified hypothyroidism: Secondary | ICD-10-CM | POA: Diagnosis not present

## 2016-04-06 DIAGNOSIS — R3129 Other microscopic hematuria: Secondary | ICD-10-CM | POA: Diagnosis not present

## 2016-04-06 DIAGNOSIS — F17200 Nicotine dependence, unspecified, uncomplicated: Secondary | ICD-10-CM | POA: Diagnosis not present

## 2016-04-06 DIAGNOSIS — Z0000 Encounter for general adult medical examination without abnormal findings: Secondary | ICD-10-CM | POA: Diagnosis not present

## 2016-04-06 DIAGNOSIS — C3490 Malignant neoplasm of unspecified part of unspecified bronchus or lung: Secondary | ICD-10-CM | POA: Diagnosis not present

## 2016-04-06 DIAGNOSIS — Z6822 Body mass index (BMI) 22.0-22.9, adult: Secondary | ICD-10-CM | POA: Diagnosis not present

## 2016-04-06 DIAGNOSIS — I10 Essential (primary) hypertension: Secondary | ICD-10-CM | POA: Diagnosis not present

## 2016-04-06 DIAGNOSIS — Z23 Encounter for immunization: Secondary | ICD-10-CM | POA: Diagnosis not present

## 2016-04-06 DIAGNOSIS — J449 Chronic obstructive pulmonary disease, unspecified: Secondary | ICD-10-CM | POA: Diagnosis not present

## 2016-04-06 DIAGNOSIS — E784 Other hyperlipidemia: Secondary | ICD-10-CM | POA: Diagnosis not present

## 2016-04-22 DIAGNOSIS — I5023 Acute on chronic systolic (congestive) heart failure: Secondary | ICD-10-CM | POA: Diagnosis not present

## 2016-04-22 DIAGNOSIS — Z9181 History of falling: Secondary | ICD-10-CM | POA: Diagnosis not present

## 2016-04-22 DIAGNOSIS — J449 Chronic obstructive pulmonary disease, unspecified: Secondary | ICD-10-CM | POA: Diagnosis not present

## 2016-04-22 DIAGNOSIS — I110 Hypertensive heart disease with heart failure: Secondary | ICD-10-CM | POA: Diagnosis not present

## 2016-04-22 DIAGNOSIS — F17200 Nicotine dependence, unspecified, uncomplicated: Secondary | ICD-10-CM | POA: Diagnosis not present

## 2016-04-22 DIAGNOSIS — R2689 Other abnormalities of gait and mobility: Secondary | ICD-10-CM | POA: Diagnosis not present

## 2016-04-22 DIAGNOSIS — M6281 Muscle weakness (generalized): Secondary | ICD-10-CM | POA: Diagnosis not present

## 2016-04-22 DIAGNOSIS — Z85118 Personal history of other malignant neoplasm of bronchus and lung: Secondary | ICD-10-CM | POA: Diagnosis not present

## 2016-04-25 DIAGNOSIS — I110 Hypertensive heart disease with heart failure: Secondary | ICD-10-CM | POA: Diagnosis not present

## 2016-04-25 DIAGNOSIS — M6281 Muscle weakness (generalized): Secondary | ICD-10-CM | POA: Diagnosis not present

## 2016-04-25 DIAGNOSIS — J449 Chronic obstructive pulmonary disease, unspecified: Secondary | ICD-10-CM | POA: Diagnosis not present

## 2016-04-25 DIAGNOSIS — Z9181 History of falling: Secondary | ICD-10-CM | POA: Diagnosis not present

## 2016-04-25 DIAGNOSIS — F17200 Nicotine dependence, unspecified, uncomplicated: Secondary | ICD-10-CM | POA: Diagnosis not present

## 2016-04-25 DIAGNOSIS — I5023 Acute on chronic systolic (congestive) heart failure: Secondary | ICD-10-CM | POA: Diagnosis not present

## 2016-04-25 DIAGNOSIS — Z85118 Personal history of other malignant neoplasm of bronchus and lung: Secondary | ICD-10-CM | POA: Diagnosis not present

## 2016-04-25 DIAGNOSIS — R2689 Other abnormalities of gait and mobility: Secondary | ICD-10-CM | POA: Diagnosis not present

## 2016-04-27 DIAGNOSIS — Z85118 Personal history of other malignant neoplasm of bronchus and lung: Secondary | ICD-10-CM | POA: Diagnosis not present

## 2016-04-27 DIAGNOSIS — I110 Hypertensive heart disease with heart failure: Secondary | ICD-10-CM | POA: Diagnosis not present

## 2016-04-27 DIAGNOSIS — R2689 Other abnormalities of gait and mobility: Secondary | ICD-10-CM | POA: Diagnosis not present

## 2016-04-27 DIAGNOSIS — Z9181 History of falling: Secondary | ICD-10-CM | POA: Diagnosis not present

## 2016-04-27 DIAGNOSIS — J449 Chronic obstructive pulmonary disease, unspecified: Secondary | ICD-10-CM | POA: Diagnosis not present

## 2016-04-27 DIAGNOSIS — M6281 Muscle weakness (generalized): Secondary | ICD-10-CM | POA: Diagnosis not present

## 2016-04-27 DIAGNOSIS — F17200 Nicotine dependence, unspecified, uncomplicated: Secondary | ICD-10-CM | POA: Diagnosis not present

## 2016-04-27 DIAGNOSIS — I5023 Acute on chronic systolic (congestive) heart failure: Secondary | ICD-10-CM | POA: Diagnosis not present

## 2016-05-09 DIAGNOSIS — W06XXXA Fall from bed, initial encounter: Secondary | ICD-10-CM

## 2016-05-09 HISTORY — DX: Fall from bed, initial encounter: W06.XXXA

## 2016-05-10 ENCOUNTER — Emergency Department (HOSPITAL_COMMUNITY): Payer: PPO

## 2016-05-10 ENCOUNTER — Encounter (HOSPITAL_COMMUNITY): Payer: Self-pay

## 2016-05-10 ENCOUNTER — Inpatient Hospital Stay (HOSPITAL_COMMUNITY)
Admission: EM | Admit: 2016-05-10 | Discharge: 2016-05-25 | DRG: 480 | Disposition: A | Discharge status: Skilled Nursing Facility | Payer: PPO | Attending: Internal Medicine | Admitting: Internal Medicine

## 2016-05-10 DIAGNOSIS — J441 Chronic obstructive pulmonary disease with (acute) exacerbation: Secondary | ICD-10-CM

## 2016-05-10 DIAGNOSIS — Z9851 Tubal ligation status: Secondary | ICD-10-CM

## 2016-05-10 DIAGNOSIS — I110 Hypertensive heart disease with heart failure: Secondary | ICD-10-CM | POA: Diagnosis not present

## 2016-05-10 DIAGNOSIS — I5022 Chronic systolic (congestive) heart failure: Secondary | ICD-10-CM | POA: Diagnosis present

## 2016-05-10 DIAGNOSIS — J189 Pneumonia, unspecified organism: Secondary | ICD-10-CM

## 2016-05-10 DIAGNOSIS — S72002A Fracture of unspecified part of neck of left femur, initial encounter for closed fracture: Secondary | ICD-10-CM

## 2016-05-10 DIAGNOSIS — Z6821 Body mass index (BMI) 21.0-21.9, adult: Secondary | ICD-10-CM

## 2016-05-10 DIAGNOSIS — Z923 Personal history of irradiation: Secondary | ICD-10-CM | POA: Diagnosis not present

## 2016-05-10 DIAGNOSIS — J439 Emphysema, unspecified: Secondary | ICD-10-CM | POA: Diagnosis not present

## 2016-05-10 DIAGNOSIS — F329 Major depressive disorder, single episode, unspecified: Secondary | ICD-10-CM | POA: Diagnosis present

## 2016-05-10 DIAGNOSIS — F17210 Nicotine dependence, cigarettes, uncomplicated: Secondary | ICD-10-CM | POA: Diagnosis present

## 2016-05-10 DIAGNOSIS — N179 Acute kidney failure, unspecified: Secondary | ICD-10-CM | POA: Diagnosis not present

## 2016-05-10 DIAGNOSIS — Z90710 Acquired absence of both cervix and uterus: Secondary | ICD-10-CM | POA: Diagnosis not present

## 2016-05-10 DIAGNOSIS — B370 Candidal stomatitis: Secondary | ICD-10-CM | POA: Diagnosis not present

## 2016-05-10 DIAGNOSIS — W19XXXA Unspecified fall, initial encounter: Secondary | ICD-10-CM

## 2016-05-10 DIAGNOSIS — R042 Hemoptysis: Secondary | ICD-10-CM

## 2016-05-10 DIAGNOSIS — Z9221 Personal history of antineoplastic chemotherapy: Secondary | ICD-10-CM | POA: Diagnosis not present

## 2016-05-10 DIAGNOSIS — Z885 Allergy status to narcotic agent status: Secondary | ICD-10-CM | POA: Diagnosis not present

## 2016-05-10 DIAGNOSIS — I429 Cardiomyopathy, unspecified: Secondary | ICD-10-CM | POA: Diagnosis not present

## 2016-05-10 DIAGNOSIS — R0600 Dyspnea, unspecified: Secondary | ICD-10-CM

## 2016-05-10 DIAGNOSIS — J9801 Acute bronchospasm: Secondary | ICD-10-CM | POA: Diagnosis present

## 2016-05-10 DIAGNOSIS — D62 Acute posthemorrhagic anemia: Secondary | ICD-10-CM | POA: Diagnosis not present

## 2016-05-10 DIAGNOSIS — R000 Tachycardia, unspecified: Secondary | ICD-10-CM | POA: Diagnosis not present

## 2016-05-10 DIAGNOSIS — Z419 Encounter for procedure for purposes other than remedying health state, unspecified: Secondary | ICD-10-CM

## 2016-05-10 DIAGNOSIS — C3401 Malignant neoplasm of right main bronchus: Secondary | ICD-10-CM | POA: Diagnosis not present

## 2016-05-10 DIAGNOSIS — S72002D Fracture of unspecified part of neck of left femur, subsequent encounter for closed fracture with routine healing: Secondary | ICD-10-CM | POA: Diagnosis not present

## 2016-05-10 DIAGNOSIS — K5909 Other constipation: Secondary | ICD-10-CM | POA: Diagnosis present

## 2016-05-10 DIAGNOSIS — K219 Gastro-esophageal reflux disease without esophagitis: Secondary | ICD-10-CM | POA: Diagnosis present

## 2016-05-10 DIAGNOSIS — T380X5A Adverse effect of glucocorticoids and synthetic analogues, initial encounter: Secondary | ICD-10-CM | POA: Diagnosis not present

## 2016-05-10 DIAGNOSIS — I5023 Acute on chronic systolic (congestive) heart failure: Secondary | ICD-10-CM | POA: Diagnosis present

## 2016-05-10 DIAGNOSIS — Z79899 Other long term (current) drug therapy: Secondary | ICD-10-CM | POA: Diagnosis not present

## 2016-05-10 DIAGNOSIS — R0603 Acute respiratory distress: Secondary | ICD-10-CM | POA: Diagnosis not present

## 2016-05-10 DIAGNOSIS — Z720 Tobacco use: Secondary | ICD-10-CM | POA: Diagnosis present

## 2016-05-10 DIAGNOSIS — R2689 Other abnormalities of gait and mobility: Secondary | ICD-10-CM

## 2016-05-10 DIAGNOSIS — Z7951 Long term (current) use of inhaled steroids: Secondary | ICD-10-CM

## 2016-05-10 DIAGNOSIS — R0602 Shortness of breath: Secondary | ICD-10-CM | POA: Diagnosis not present

## 2016-05-10 DIAGNOSIS — W06XXXA Fall from bed, initial encounter: Secondary | ICD-10-CM | POA: Diagnosis present

## 2016-05-10 DIAGNOSIS — E038 Other specified hypothyroidism: Secondary | ICD-10-CM | POA: Diagnosis not present

## 2016-05-10 DIAGNOSIS — J9621 Acute and chronic respiratory failure with hypoxia: Secondary | ICD-10-CM

## 2016-05-10 DIAGNOSIS — S7222XA Displaced subtrochanteric fracture of left femur, initial encounter for closed fracture: Secondary | ICD-10-CM | POA: Diagnosis not present

## 2016-05-10 DIAGNOSIS — G40909 Epilepsy, unspecified, not intractable, without status epilepticus: Secondary | ICD-10-CM | POA: Diagnosis present

## 2016-05-10 DIAGNOSIS — K5641 Fecal impaction: Secondary | ICD-10-CM

## 2016-05-10 DIAGNOSIS — I10 Essential (primary) hypertension: Secondary | ICD-10-CM | POA: Diagnosis present

## 2016-05-10 DIAGNOSIS — J432 Centrilobular emphysema: Secondary | ICD-10-CM | POA: Diagnosis not present

## 2016-05-10 DIAGNOSIS — E871 Hypo-osmolality and hyponatremia: Secondary | ICD-10-CM | POA: Diagnosis present

## 2016-05-10 DIAGNOSIS — R05 Cough: Secondary | ICD-10-CM | POA: Diagnosis not present

## 2016-05-10 DIAGNOSIS — J9600 Acute respiratory failure, unspecified whether with hypoxia or hypercapnia: Secondary | ICD-10-CM

## 2016-05-10 DIAGNOSIS — R140 Abdominal distension (gaseous): Secondary | ICD-10-CM

## 2016-05-10 DIAGNOSIS — K5900 Constipation, unspecified: Secondary | ICD-10-CM | POA: Diagnosis not present

## 2016-05-10 DIAGNOSIS — J449 Chronic obstructive pulmonary disease, unspecified: Secondary | ICD-10-CM

## 2016-05-10 DIAGNOSIS — S72142A Displaced intertrochanteric fracture of left femur, initial encounter for closed fracture: Secondary | ICD-10-CM | POA: Diagnosis not present

## 2016-05-10 DIAGNOSIS — Z85118 Personal history of other malignant neoplasm of bronchus and lung: Secondary | ICD-10-CM | POA: Diagnosis not present

## 2016-05-10 DIAGNOSIS — R1013 Epigastric pain: Secondary | ICD-10-CM

## 2016-05-10 DIAGNOSIS — Z7982 Long term (current) use of aspirin: Secondary | ICD-10-CM

## 2016-05-10 DIAGNOSIS — C3490 Malignant neoplasm of unspecified part of unspecified bronchus or lung: Secondary | ICD-10-CM | POA: Diagnosis present

## 2016-05-10 DIAGNOSIS — I509 Heart failure, unspecified: Secondary | ICD-10-CM | POA: Diagnosis not present

## 2016-05-10 DIAGNOSIS — R918 Other nonspecific abnormal finding of lung field: Secondary | ICD-10-CM

## 2016-05-10 HISTORY — DX: Unspecified chronic bronchitis: J42

## 2016-05-10 HISTORY — DX: Chronic systolic (congestive) heart failure: I50.22

## 2016-05-10 HISTORY — DX: Unspecified osteoarthritis, unspecified site: M19.90

## 2016-05-10 HISTORY — DX: Epilepsy, unspecified, not intractable, without status epilepticus: G40.909

## 2016-05-10 HISTORY — DX: Cerebral infarction, unspecified: I63.9

## 2016-05-10 HISTORY — DX: Tachycardia, unspecified: R00.0

## 2016-05-10 HISTORY — DX: Cardiac murmur, unspecified: R01.1

## 2016-05-10 HISTORY — DX: Fall from bed, initial encounter: W06.XXXA

## 2016-05-10 LAB — CBC WITH DIFFERENTIAL/PLATELET
Basophils Absolute: 0 10*3/uL (ref 0.0–0.1)
Basophils Relative: 0 %
EOS ABS: 0 10*3/uL (ref 0.0–0.7)
EOS PCT: 0 %
HCT: 38.3 % (ref 36.0–46.0)
Hemoglobin: 13.5 g/dL (ref 12.0–15.0)
LYMPHS ABS: 0.6 10*3/uL — AB (ref 0.7–4.0)
LYMPHS PCT: 6 %
MCH: 32 pg (ref 26.0–34.0)
MCHC: 35.2 g/dL (ref 30.0–36.0)
MCV: 90.8 fL (ref 78.0–100.0)
MONOS PCT: 5 %
Monocytes Absolute: 0.5 10*3/uL (ref 0.1–1.0)
Neutro Abs: 8.7 10*3/uL — ABNORMAL HIGH (ref 1.7–7.7)
Neutrophils Relative %: 89 %
PLATELETS: 254 10*3/uL (ref 150–400)
RBC: 4.22 MIL/uL (ref 3.87–5.11)
RDW: 13.5 % (ref 11.5–15.5)
WBC: 9.8 10*3/uL (ref 4.0–10.5)

## 2016-05-10 LAB — COMPREHENSIVE METABOLIC PANEL
ALT: 21 U/L (ref 14–54)
AST: 32 U/L (ref 15–41)
Albumin: 3.3 g/dL — ABNORMAL LOW (ref 3.5–5.0)
Alkaline Phosphatase: 55 U/L (ref 38–126)
Anion gap: 12 (ref 5–15)
BILIRUBIN TOTAL: 0.9 mg/dL (ref 0.3–1.2)
BUN: 12 mg/dL (ref 6–20)
CO2: 24 mmol/L (ref 22–32)
CREATININE: 0.91 mg/dL (ref 0.44–1.00)
Calcium: 9 mg/dL (ref 8.9–10.3)
Chloride: 93 mmol/L — ABNORMAL LOW (ref 101–111)
Glucose, Bld: 110 mg/dL — ABNORMAL HIGH (ref 65–99)
Potassium: 4 mmol/L (ref 3.5–5.1)
Sodium: 129 mmol/L — ABNORMAL LOW (ref 135–145)
TOTAL PROTEIN: 6.9 g/dL (ref 6.5–8.1)

## 2016-05-10 LAB — URINALYSIS, ROUTINE W REFLEX MICROSCOPIC
GLUCOSE, UA: NEGATIVE mg/dL
HGB URINE DIPSTICK: NEGATIVE
KETONES UR: NEGATIVE mg/dL
LEUKOCYTES UA: NEGATIVE
Nitrite: NEGATIVE
PH: 6 (ref 5.0–8.0)
Protein, ur: NEGATIVE mg/dL
Specific Gravity, Urine: 1.025 (ref 1.005–1.030)

## 2016-05-10 LAB — I-STAT ARTERIAL BLOOD GAS, ED
ACID-BASE EXCESS: 1 mmol/L (ref 0.0–2.0)
Bicarbonate: 26.8 mmol/L (ref 20.0–28.0)
O2 SAT: 100 %
PH ART: 7.379 (ref 7.350–7.450)
TCO2: 28 mmol/L (ref 0–100)
pCO2 arterial: 45.4 mmHg (ref 32.0–48.0)
pO2, Arterial: 174 mmHg — ABNORMAL HIGH (ref 83.0–108.0)

## 2016-05-10 LAB — CREATININE, SERUM
Creatinine, Ser: 1.1 mg/dL — ABNORMAL HIGH (ref 0.44–1.00)
GFR calc non Af Amer: 49 mL/min — ABNORMAL LOW (ref >60)
GFR, EST AFRICAN AMERICAN: 57 mL/min — AB (ref >60)

## 2016-05-10 LAB — PROTIME-INR
INR: 1
PROTHROMBIN TIME: 13.2 s (ref 11.4–15.2)

## 2016-05-10 LAB — I-STAT TROPONIN, ED: TROPONIN I, POC: 0.01 ng/mL (ref 0.00–0.08)

## 2016-05-10 LAB — BRAIN NATRIURETIC PEPTIDE: B Natriuretic Peptide: 305 pg/mL — ABNORMAL HIGH (ref 0.0–100.0)

## 2016-05-10 LAB — OSMOLALITY: Osmolality: 276 mOsm/kg (ref 275–295)

## 2016-05-10 LAB — OSMOLALITY, URINE: Osmolality, Ur: 666 mOsm/kg (ref 300–900)

## 2016-05-10 LAB — SODIUM, URINE, RANDOM: Sodium, Ur: 20 mmol/L

## 2016-05-10 LAB — I-STAT CG4 LACTIC ACID, ED: Lactic Acid, Venous: 1.4 mmol/L (ref 0.5–1.9)

## 2016-05-10 MED ORDER — ALBUTEROL SULFATE (2.5 MG/3ML) 0.083% IN NEBU
5.0000 mg | INHALATION_SOLUTION | Freq: Once | RESPIRATORY_TRACT | Status: AC
  Administered 2016-05-10: 5 mg via RESPIRATORY_TRACT
  Filled 2016-05-10: qty 6

## 2016-05-10 MED ORDER — LEVOFLOXACIN IN D5W 500 MG/100ML IV SOLN
500.0000 mg | INTRAVENOUS | Status: DC
  Administered 2016-05-11: 500 mg via INTRAVENOUS
  Filled 2016-05-10 (×2): qty 100

## 2016-05-10 MED ORDER — SPIRONOLACTONE 25 MG PO TABS
12.5000 mg | ORAL_TABLET | Freq: Every day | ORAL | Status: DC
  Administered 2016-05-10 – 2016-05-11 (×2): 12.5 mg via ORAL
  Filled 2016-05-10 (×2): qty 1

## 2016-05-10 MED ORDER — ALBUTEROL (5 MG/ML) CONTINUOUS INHALATION SOLN
10.0000 mg/h | INHALATION_SOLUTION | RESPIRATORY_TRACT | Status: DC
  Administered 2016-05-10: 10 mg/h via RESPIRATORY_TRACT
  Filled 2016-05-10: qty 20

## 2016-05-10 MED ORDER — DEXTROSE 5 % IV SOLN
500.0000 mg | Freq: Once | INTRAVENOUS | Status: AC
  Administered 2016-05-10: 500 mg via INTRAVENOUS
  Filled 2016-05-10: qty 500

## 2016-05-10 MED ORDER — SIMVASTATIN 20 MG PO TABS
20.0000 mg | ORAL_TABLET | Freq: Every day | ORAL | Status: DC
  Administered 2016-05-10 – 2016-05-25 (×15): 20 mg via ORAL
  Filled 2016-05-10 (×15): qty 1

## 2016-05-10 MED ORDER — MOMETASONE FURO-FORMOTEROL FUM 200-5 MCG/ACT IN AERO
2.0000 | INHALATION_SPRAY | Freq: Two times a day (BID) | RESPIRATORY_TRACT | Status: DC
  Administered 2016-05-10 – 2016-05-11 (×2): 2 via RESPIRATORY_TRACT
  Filled 2016-05-10: qty 8.8

## 2016-05-10 MED ORDER — DEXTROSE 5 % IV SOLN
1.0000 g | Freq: Once | INTRAVENOUS | Status: AC
  Administered 2016-05-10: 1 g via INTRAVENOUS
  Filled 2016-05-10: qty 10

## 2016-05-10 MED ORDER — HEPARIN SODIUM (PORCINE) 5000 UNIT/ML IJ SOLN: 5000.0000 [IU] | Freq: Three times a day (TID) | INTRAMUSCULAR | Status: AC

## 2016-05-10 MED ORDER — IPRATROPIUM-ALBUTEROL 0.5-2.5 (3) MG/3ML IN SOLN: 3.0000 mL | Freq: Four times a day (QID) | RESPIRATORY_TRACT | Status: DC

## 2016-05-10 MED ORDER — PANTOPRAZOLE SODIUM 40 MG PO TBEC
40.0000 mg | DELAYED_RELEASE_TABLET | Freq: Every day | ORAL | Status: DC
  Administered 2016-05-10 – 2016-05-25 (×15): 40 mg via ORAL
  Filled 2016-05-10 (×15): qty 1

## 2016-05-10 MED ORDER — IPRATROPIUM-ALBUTEROL 0.5-2.5 (3) MG/3ML IN SOLN
3.0000 mL | RESPIRATORY_TRACT | Status: DC
  Administered 2016-05-10 – 2016-05-11 (×5): 3 mL via RESPIRATORY_TRACT
  Filled 2016-05-10 (×5): qty 3

## 2016-05-10 MED ORDER — IPRATROPIUM-ALBUTEROL 0.5-2.5 (3) MG/3ML IN SOLN
RESPIRATORY_TRACT | Status: AC
  Administered 2016-05-10: 3 mL via RESPIRATORY_TRACT
  Filled 2016-05-10: qty 3

## 2016-05-10 MED ORDER — IPRATROPIUM BROMIDE 0.02 % IN SOLN: 0.5000 mg | Freq: Four times a day (QID) | RESPIRATORY_TRACT | Status: DC

## 2016-05-10 MED ORDER — METHYLPREDNISOLONE SODIUM SUCC 125 MG IJ SOLR
60.0000 mg | Freq: Four times a day (QID) | INTRAMUSCULAR | Status: DC
Start: 2016-05-10 — End: 2016-05-12
  Administered 2016-05-10 – 2016-05-12 (×8): 60 mg via INTRAVENOUS
  Filled 2016-05-10 (×8): qty 2

## 2016-05-10 MED ORDER — LEVETIRACETAM ER 750 MG PO TB24: 750.0000 mg | ORAL_TABLET | Freq: Every day | ORAL | Status: DC

## 2016-05-10 MED ORDER — SENNOSIDES-DOCUSATE SODIUM 8.6-50 MG PO TABS
1.0000 | ORAL_TABLET | Freq: Every evening | ORAL | Status: DC | PRN
  Administered 2016-05-13: 1 via ORAL
  Filled 2016-05-10: qty 1

## 2016-05-10 MED ORDER — DOXYCYCLINE HYCLATE 100 MG PO TABS: 100.0000 mg | ORAL_TABLET | Freq: Two times a day (BID) | ORAL | Status: DC

## 2016-05-10 MED ORDER — SODIUM CHLORIDE 0.9 % IV SOLN: INTRAVENOUS | Status: AC

## 2016-05-10 MED ORDER — ASPIRIN EC 81 MG PO TBEC
81.0000 mg | DELAYED_RELEASE_TABLET | Freq: Every day | ORAL | Status: DC
  Administered 2016-05-11 – 2016-05-21 (×10): 81 mg via ORAL
  Filled 2016-05-10 (×10): qty 1

## 2016-05-10 MED ORDER — MORPHINE SULFATE (PF) 4 MG/ML IV SOLN
0.5000 mg | INTRAVENOUS | Status: DC | PRN
  Filled 2016-05-10: qty 1

## 2016-05-10 MED ORDER — IPRATROPIUM-ALBUTEROL 0.5-2.5 (3) MG/3ML IN SOLN
3.0000 mL | Freq: Once | RESPIRATORY_TRACT | Status: AC
  Administered 2016-05-10: 3 mL via RESPIRATORY_TRACT

## 2016-05-10 MED ORDER — ALBUTEROL SULFATE (2.5 MG/3ML) 0.083% IN NEBU
2.5000 mg | INHALATION_SOLUTION | Freq: Four times a day (QID) | RESPIRATORY_TRACT | Status: DC | PRN
  Administered 2016-05-10 – 2016-05-11 (×3): 2.5 mg via RESPIRATORY_TRACT
  Filled 2016-05-10 (×3): qty 3

## 2016-05-10 MED ORDER — FENTANYL CITRATE (PF) 100 MCG/2ML IJ SOLN
50.0000 ug | Freq: Once | INTRAMUSCULAR | Status: AC
  Administered 2016-05-10: 50 ug via INTRAVENOUS
  Filled 2016-05-10: qty 2

## 2016-05-10 MED ORDER — ASPIRIN 81 MG PO TBEC: 81.0000 mg | DELAYED_RELEASE_TABLET | Freq: Every day | ORAL | Status: DC

## 2016-05-10 MED ORDER — ALBUTEROL SULFATE HFA 108 (90 BASE) MCG/ACT IN AERS: 2.0000 | INHALATION_SPRAY | Freq: Four times a day (QID) | RESPIRATORY_TRACT | Status: DC | PRN

## 2016-05-10 MED ORDER — HYDROCODONE-ACETAMINOPHEN 5-325 MG PO TABS
1.0000 | ORAL_TABLET | Freq: Four times a day (QID) | ORAL | Status: DC | PRN
  Administered 2016-05-10 – 2016-05-11 (×2): 2 via ORAL
  Filled 2016-05-10 (×2): qty 2

## 2016-05-10 MED ORDER — GABAPENTIN 300 MG PO CAPS
300.0000 mg | ORAL_CAPSULE | Freq: Two times a day (BID) | ORAL | Status: DC
  Administered 2016-05-10 – 2016-05-24 (×25): 300 mg via ORAL
  Filled 2016-05-10 (×28): qty 1

## 2016-05-10 MED ORDER — IPRATROPIUM BROMIDE 0.02 % IN SOLN
0.5000 mg | Freq: Once | RESPIRATORY_TRACT | Status: AC
  Administered 2016-05-10: 0.5 mg via RESPIRATORY_TRACT
  Filled 2016-05-10: qty 2.5

## 2016-05-10 MED ORDER — LEVOTHYROXINE SODIUM 25 MCG PO TABS
25.0000 ug | ORAL_TABLET | Freq: Every day | ORAL | Status: DC
  Administered 2016-05-11 – 2016-05-25 (×14): 25 ug via ORAL
  Filled 2016-05-10 (×16): qty 1

## 2016-05-10 MED ORDER — IPRATROPIUM BROMIDE HFA 17 MCG/ACT IN AERS: 2.0000 | INHALATION_SPRAY | Freq: Four times a day (QID) | RESPIRATORY_TRACT | Status: DC

## 2016-05-10 MED ORDER — LEVOTHYROXINE SODIUM 75 MCG PO TABS: 75.0000 ug | ORAL_TABLET | Freq: Every day | ORAL | Status: DC

## 2016-05-10 NOTE — H&P (Signed)
History and Physical    Janice Daniels:[id number] DOB: 18-Jan-1946 DOA: 05/10/2016  PCP: Janice Lopes, MD Patient coming from: home  Chief Complaint: left hip pain/sob  HPI: Janice Daniels is a very pleasant 70 y.o. 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 Initial evaluation reveals a left closed hip fracture in the setting of some respiratory distress or likely related to COPD exacerbation  Information is obtained from the patient and the chart. Patient reports she "rolled out of bed" during the night while she was asleep. She reports feeling immediate left hip pain and was unable to get up off the floor. She denies any other injury or loss of consciousness. He states the pain was immediate sharp constant worse with moving located in her left hip. He made the pain worse. She states her husband had to help her get up and now is very painful. In addition she reports a persistent worsening shortness of breath for the last several weeks. She saw PCP who prescribed prednisone taper which was due to be complete tomorrow as well as Augmentin. She denies having had a chest x-ray done. Associated symptoms include subjective fever productive cough with increased sputum production thick greenish. In addition she has all but wheezing has been using her nebulizers per usual but states it's not helping. As a result of the above she reports a decreased oral intake and feels as though she is dehydrated. She complains of some dysuria but no frequency. She denies headache dizziness syncope or near-syncope. She denies chest pain palpitations abdominal pain nausea vomiting lower extremity edema.  She does complain of some constipation.    ED Course: In the emergency department she's afebrile hemodynamically stable mild respiratory distress. She was provided nebulizers at which point her oxygen saturation level dropped. He is provided with nebulizers and analgesia  Review of Systems: As per HPI otherwise 10 point review of systems negative.   Ambulatory Status: Ambulates at home with fairly steady gait independent with ADLs  Past Medical History:  Diagnosis Date  . Acute on chronic systolic CHF (congestive heart failure) ([place])   . COPD (chronic obstructive pulmonary disease) ([place])   . Depression   . GERD (gastroesophageal reflux disease)   . History of chemotherapy    carboplatin and etoposide  . History of radiation therapy 06/05/11 to 07/18/11   lung  . History of radiation therapy 09/13/11 to 09/26/11   brain  . Hx of radiation therapy 10/03/12 - 10/09/12   RUL lung  . Hypertension   . Hypothyroid   . Low back pain   . Lung cancer ([place]) 05/09/2011   RUL  . Lung cancer, upper lobe ([place]) 09/02/2012   rul  . Shingles     Past Surgical History:  Procedure Laterality Date  . endobronchial biopsy Right 05/03/2011  . TONSILLECTOMY    . TOTAL ABDOMINAL HYSTERECTOMY  1986  . TUBAL LIGATION  1984    Social History   Social History  . Marital status: Married    Spouse name: Janice Daniels  . Number of children: 5  . Years of education: 12   Occupational History  . retired [employer]arry manager    Social History Main Topics  . Smoking status: Current Some Day  Smoker    Packs/day: 1.00    Years: 50.00    Types: Cigarettes    Last attempt to quit: 11/22/2014  . Smokeless tobacco: Never Used     Comment: Smokes 1 pack per week  . Alcohol use No  . Drug use: No  . Sexual activity: Not on file   Other Topics Concern  . Not on file   Social History Narrative   She is married to [place] and had 5 sons.  She had a daughter who died at 77 months.  Her son, Janice Daniels, died at age 22 from alcoholism  and pancreas disease.  Her son, Janice Daniels, is 59 was born [birth year] and is alive and well.  Her son, Janice Daniels, is 14, born [birth year], and has bipolar disorder.  Her son, Janice Daniels, is 43, born [birth year], and has kidney disease.  Her son, Janice Daniels, is age 41, born [birth year], and is in the [place].    Right-handed.   2-4 cups caffeine per day.    Allergies  Allergen Reactions  . Codeine Other (See Comments)    Elevated pulse    Family History  Problem Relation Age of Onset  . Esophageal cancer Father   . Hypothyroidism Father   . Cancer Father     esophageal  . Heart disease Mother   . Hyperlipidemia Mother   . Hypertension Mother     heart disease  . Other Brother     low back pain  . Cancer Brother     esophageal  . Other Brother     hypochondriasis  . Other Brother     suicide  . Esophageal cancer Brother   . Cervical cancer Sister   . Cancer Sister     cervical    Prior to Admission medications   Medication Sig Start Date End Date Taking? Authorizing Provider  ADVAIR DISKUS 250-50 MCG/DOSE AEPB Inhale 1 puff into the lungs 2 (two) times daily. 05/11/15  Yes Historical Provider, MD  albuterol (VENTOLIN HFA) 108 (90 BASE) MCG/ACT inhaler Inhale 2 puffs into the lungs every 6 (six) hours as needed for wheezing or shortness of breath.    Yes Historical Provider, MD  aspirin EC 81 MG EC tablet Take 1 tablet (81 mg total) by mouth daily. 10/30/14  Yes Shanker Kristeen Mans, MD  gabapentin (NEURONTIN) 300 MG capsule Take 300 mg by mouth 2 (two) times daily. 01/27/16  Yes Historical Provider, MD  ipratropium (ATROVENT HFA) 17 MCG/ACT inhaler Inhale 2 puffs into the lungs every 6 (six) hours.   Yes Historical Provider, MD  ipratropium-albuterol (DUONEB) 0.5-2.5 (3) MG/3ML SOLN Take 3 mLs by nebulization every 6 (six) hours. 10/30/14  Yes Shanker Kristeen Mans, MD  Levetiracetam (KEPPRA XR) 750 MG TB24 Take 1 tablet (750 mg total) by mouth at bedtime. 11/10/15  Yes Marcial Pacas, MD  levothyroxine (SYNTHROID, LEVOTHROID)  25 MCG tablet Take 25 mcg by mouth daily. 12/23/15  Yes Historical Provider, MD  omeprazole (PRILOSEC) 20 MG capsule Take 20 mg by mouth daily. 12/31/15  Yes Historical Provider, MD  simvastatin (ZOCOR) 20 MG tablet Take 1 tablet (20 mg total) by mouth daily. PLEASE CONTACT OFFICE FOR ADDITIONAL REFILLS 02/02/16  Yes Minus Breeding, MD  spironolactone (ALDACTONE) 25 MG tablet Take 0.5 tablets (12.5 mg total) by mouth daily. PLEASE CONTACT OFFICE FOR ADDITIONAL REFILLS 02/21/16  Yes Minus Breeding, MD  Vitamin D, Ergocalciferol, (DRISDOL) 50000 UNITS CAPS capsule Take 50,000 Units by mouth every 7 (seven) days. Monday  Yes Historical Provider, MD  levothyroxine (SYNTHROID, LEVOTHROID) 75 MCG tablet Take 75 mcg by mouth daily. 07/23/15   Historical Provider, MD    Physical Exam: Vitals:   05/10/16 1045 05/10/16 1157 05/10/16 1200 05/10/16 1325  BP: 134/86 107/74 101/74   Pulse: 105 106 108   Resp: '26 25 24   '$ Temp:      TempSrc:      SpO2: 96% 97% 93% 98%  Weight:      Height:         General:  Appears calm and comfortable, no acute distress Eyes:  PERRL, EOMI, normal lids, iris ENT:  grossly normal hearing, lips & tongue, mucous membranes of her mouth are pink slightly dry Neck:  no LAD, masses or thyromegaly Cardiovascular:  RRR, no m/r/g. No LE edema.  Respiratory:  Mild increased work of breathing with conversation. Breath sounds quite coarse throughout decreased air movement. Faint end expiratory wheezing Abdomen:  soft, ntnd, positive bowel sounds no guarding or rebounding Skin:  no rash or induration seen on limited exam Musculoskeletal:  grossly normal tone BUE/BLE, good ROM, no bony abnormality left hip without swelling or bruising decreased range of motion due to pain Psychiatric:  grossly normal mood and affect, speech fluent and appropriate, AOx3 Neurologic:  CN 2-12 grossly intact, moves all extremities in coordinated fashion, sensation intact  Labs on Admission: I have  personally reviewed following labs and imaging studies  CBC:  Recent Labs Lab 05/10/16 1044  WBC 9.8  NEUTROABS 8.7*  HGB 13.5  HCT 38.3  MCV 90.8  PLT 476   Basic Metabolic Panel:  Recent Labs Lab 05/10/16 1044  NA 129*  K 4.0  CL 93*  CO2 24  GLUCOSE 110*  BUN 12  CREATININE 0.91  CALCIUM 9.0   GFR: Estimated Creatinine Clearance: 49 mL/min (by C-G formula based on SCr of 0.91 mg/dL). Liver Function Tests:  Recent Labs Lab 05/10/16 1044  AST 32  ALT 21  ALKPHOS 55  BILITOT 0.9  PROT 6.9  ALBUMIN 3.3*   No results for input(s): LIPASE, AMYLASE in the last 168 hours. No results for input(s): AMMONIA in the last 168 hours. Coagulation Profile: No results for input(s): INR, PROTIME in the last 168 hours. Cardiac Enzymes: No results for input(s): CKTOTAL, CKMB, CKMBINDEX, TROPONINI in the last 168 hours. BNP (last 3 results) No results for input(s): PROBNP in the last 8760 hours. HbA1C: No results for input(s): HGBA1C in the last 72 hours. CBG: No results for input(s): GLUCAP in the last 168 hours. Lipid Profile: No results for input(s): CHOL, HDL, LDLCALC, TRIG, CHOLHDL, LDLDIRECT in the last 72 hours. Thyroid Function Tests: No results for input(s): TSH, T4TOTAL, FREET4, T3FREE, THYROIDAB in the last 72 hours. Anemia Panel: No results for input(s): VITAMINB12, FOLATE, FERRITIN, TIBC, IRON, RETICCTPCT in the last 72 hours. Urine analysis:    Component Value Date/Time   COLORURINE YELLOW 03/22/2011 1410   APPEARANCEUR CLOUDY (A) 03/22/2011 1410   LABSPEC 1.012 03/22/2011 1410   PHURINE 7.0 03/22/2011 1410   GLUCOSEU NEGATIVE 03/22/2011 1410   HGBUR NEGATIVE 03/22/2011 1410   HGBUR trace-lysed 09/07/2009 0847   BILIRUBINUR NEGATIVE 03/22/2011 1410   KETONESUR NEGATIVE 03/22/2011 1410   PROTEINUR NEGATIVE 03/22/2011 1410   UROBILINOGEN 0.2 03/22/2011 1410   NITRITE NEGATIVE 03/22/2011 1410   LEUKOCYTESUR MODERATE (A) 03/22/2011 1410     Creatinine Clearance: Estimated Creatinine Clearance: 49 mL/min (by C-G formula based on SCr of 0.91 mg/dL).  Sepsis Labs: @  LABRCNTIP(procalcitonin:4,lacticidven:4) )No results found for this or any previous visit (from the past 240 hour(s)).   Radiological Exams on Admission: Dg Chest 2 View  Result Date: 05/10/2016 CLINICAL DATA:  Status post falling out of bed yesterday striking the left hip. The patient reports worsening shortness of breath and nonproductive cough. History of COPD. Current smoker. History of lung malignancy. EXAM: CHEST  2 VIEW COMPARISON:  CT scan of the chest of February 07, 2016 FINDINGS: The lungs are hyperinflated. There are chronic changes in the right suprahilar region not greatly changed from the previous study. There is no alveolar infiltrate. There is no pleural effusion. The heart and pulmonary vascularity are normal. There is mild shift of mediastinum toward the right. The observed bony thorax exhibits no acute abnormality. IMPRESSION: COPD. Chronic right upper lobe changes consistent with known malignancy. There has not been dramatic radiographic change since the CT scan of February 07, 2016 but direct comparison of today's chest x-ray with the previous CT scan is of limited utility. Repeat chest CT scanning is recommended. COPD.  No CHF.  The observed bony thorax is unremarkable. Electronically Signed   By: David  Martinique M.D.   On: 05/10/2016 11:59   Ct Hip Left Wo Contrast  Result Date: 05/10/2016 CLINICAL DATA:  Proximal left femur fracture secondary to a fall yesterday. EXAM: CT OF THE LEFT HIP WITHOUT CONTRAST TECHNIQUE: Multidetector CT imaging of the left hip was performed according to the standard protocol. Multiplanar CT image reconstructions were also generated. COMPARISON:  Radiographs dated 05/10/2016 FINDINGS: Bones/Joint/Cartilage There is an impacted slightly comminuted intertrochanteric and low left femoral neck fracture with minimal angulation and  minimal displacement. The visualized pelvic bones are intact. Joint spaces well-preserved. Adjacent soft tissues are normal. IMPRESSION: Impacted slightly comminuted intertrochanteric and low femoral neck fracture on the left. Electronically Signed   By: Lorriane Shire M.D.   On: 05/10/2016 13:29   Dg Hip Unilat W Or Wo Pelvis 2-3 Views Left  Result Date: 05/10/2016 CLINICAL DATA:  Left hip pain after fall yesterday. EXAM: DG HIP (WITH OR WITHOUT PELVIS) 2-3V LEFT COMPARISON:  None. FINDINGS: There appears to be cortical disruption involving the greater trochanter concerning for mildly displaced fracture. The left femoral head and neck appear intact. No dislocation is noted. IMPRESSION: Probable mildly displaced fracture involving greater trochanter of proximal left femur. CT scan of the left hip is recommended for further evaluation. Electronically Signed   By: Marijo Conception, M.D.   On: 05/10/2016 12:01    EKG: Independently reviewed. Sinus tachycardia Left axis deviation Nonspecific ST and T  Assessment/Plan Principal Problem:   Closed left hip fracture (HCC) Active Problems:   Essential hypertension   COPD (chronic obstructive pulmonary disease) with emphysema (HCC)   GERD   Lung cancer (HCC)   COPD exacerbation (HCC)   Hypothyroidism   Other specified hypothyroidism   Tobacco abuse   Chronic systolic heart failure (HCC)   Hyponatremia   Acute respiratory distress   #1. Closed left hip fracture status post falling out of bed. CT of the left hip as noted above. No loss of consciousness no other noted injury. Has been ill with COPD exacerbation last several days. -Admit -Pain management -Await orthopedic recommendations -Likely surgery tomorrow 1 COPD exacerbation improves  #2. COPD with exacerbation. Not on oxygen at home. Patient reports finishing antibiotics and oral prednisone outpatient recently. Improved briefly and then worsened. Revisited PCP yesterday. Again given  prednisone and Augmentin. Chest x-ray reveals C  OPD. No CHF. -Scheduled nebs -Steroids -doxy -Oxygen supplementation as indicated -Wean oxygen as able -Antitussives  #3. Chronic systolic heart failure. Does not appear overloaded. Echo last year reveals an EF of 35-40%, mild LVH, diffuse hypokinesis. BNP 305.  Home medications include spironolactone -Monitor intake and output -Obtain daily weights -Monitor  #4. Hyponatremia. Sodium level 129. Likely related to decreased oral intake of late the setting of spironolactone -Gentle IV fluids -Urine sodium -Urine osmolality -Serum osmolality -recheck in am  #5. Hypertension. Controlled in the emergency department. -Continue home meds -Monitor closely  #6. Hypothyroidism -Obtain a TSH -Continue home meds  #7. History of partial seizure. Reports no seizure activity in many many years. Chart review reveals neuro office visit 10/2015 at which time keppra continued -Continue home medications  #8. Tobacco use. -Cessation counseling offered  #9. History small cell lung cancer. Diagnosed October 2012. Chart review indicates status post palliative radiotherapy, systemic chemotherapy prophylactic cranial irradiation, curative stereotactic radiotherapy. Office visit note dated August 2017 [place] recent CT scan showed no evidence for disease progression septic for evolving radiation changes and masslike consolidation in the right upper lobe and posterior right upper lobe. Noted also indicates this is been evaluated with PET scan in the past and showed no hypermetabolic activity -Patient will follow-up in 6 months with oncology  #10. Acute respiratory distress. Related to copd exacerbation.  Chest xray as noted above. BNP 305. Lactic acid within limits of normal.  Audible wheezes on exam. Recently treated OP with prednisone and antibiotics. Reportedly oxygen saturation level dropped with nebs. On admission oxygen saturation level 95% during nebs -see  above -ween oxygen as able.  -monitor oxygen saturation levels    DVT prophylaxis: heparin  Code Status: full  Family Communication: none present  Disposition Plan: home  Consults called: ortho per ED MD  Admission status: inpatient    Radene Gunning MD [facility]  If 7PM-7AM, please contact night-coverage [email or web address] Password [email or web address]  05/10/2016, 2:35 PM

## 2016-05-10 NOTE — ED Provider Notes (Signed)
[facility] DEPT Provider Note   CSN: [id number] Arrival date & time: 05/10/16  1007     History   Chief Complaint Chief Complaint  Patient presents with  . Hip Pain    pt fell onto her L hip this am   . Shortness of Breath    pt appears SOB upon exam     HPI Janice Daniels is a 70 y.o. 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 Patient states that she rolled off of her bed, landing on her left hip, and reports that she woke up immediately and had no LOC; states she may have bumped her head slightly, but denies any headaches. She describes the hip pain as 10/10 constant sharp nonradiating left hip pain worse with movement and with no treatments tried prior to arrival. Additionally she reports that she's had shortness of breath for several weeks, was given prednisone 10 days from 11/1-10/17, had improved after prednisone but when she came off of it the SOB started getting worse, so she was restarted on prednisone and Augmentin yesterday by her PCP Dr. Sharlett Iles; denies having any chest imaging done. She states that she has a cough with sputum production, although she doesn't know what color it is because she doesn't look at it, but denies hemoptysis. Additionally she reports that she's been wheezing. She uses home nebulizers which help, but she still feels short of breath. She states that typically after a few days of prednisone she does start to get better.  She also states that she is had some chronic constipation, hasn't had a bowel movement in 4-5 days, but this is typical for her. Her husband reports that she hasn't been eating or drinking very much,  and has been urinating less. The patient states that she urinated yesterday, but has been unable to get the toilet today so she has not been able to urinate, but she feels the urge to go right now and requests assistance to the restroom. Denies any incontinence overnight. She is on spironolactone and another fluid pill that they cannot recall the name of, she is always on these and these are not new or changed prescriptions.  She denies any fevers, chills, chest pain, hemoptysis, leg swelling, abdominal pain, nausea, vomiting, diarrhea, dysuria, hematuria, incontinence. Or stool, saddle anesthesia or cauda equina symptoms, numbness, tingling, focal weakness, bruising, abrasions, headache, LOC, neck or back pain, or any other injuries or complaints at this time. She is not on any blood thinners. Her oncologist is Dr. Inda Merlin.    Of note: Per Dr. Arvilla Market notes from 02/14/16- Her recent CT scan of the chest showed no evidence for disease progression except for the evolving radiation changes and masslike consolidation in the right upper parahilar lung and posterior right upper lobe. This has been evaluated with a PET scan in the past and showed no hypermetabolic activity. I discussed the scan results with the patient and recommended for her to continue on observation. I gave her the option to come back for follow-up visit in 4 months for evaluation with repeat scan but the patient would like to continue with her routine six-month visit. She was advised to call immediately if she has any  concerning respiratory symptoms.   The history is provided by the patient, the spouse and medical records. No language interpreter was used.  Hip Pain  This is a new problem. The current episode started yesterday. The problem occurs constantly. The problem has not changed since onset.Associated symptoms include shortness of breath. Pertinent negatives include no chest pain, no abdominal pain and no headaches. The symptoms  are aggravated by walking and bending. Nothing relieves the symptoms. She has tried nothing for the symptoms. The treatment provided no relief.  Shortness of Breath  Associated symptoms include cough and wheezing. Pertinent negatives include no fever, no headaches, no neck pain, no chest pain, no vomiting, no abdominal pain and no leg swelling.    Past Medical History:  Diagnosis Date  . Acute on chronic systolic CHF (congestive heart failure) ([place])   . COPD (chronic obstructive pulmonary disease) ([place])   . Depression   . GERD (gastroesophageal reflux disease)   . History of chemotherapy    carboplatin and etoposide  . History of radiation therapy 06/05/11 to 07/18/11   lung  . History of radiation therapy 09/13/11 to 09/26/11   brain  . Hx of radiation therapy 10/03/12 - 10/09/12   RUL lung  . Hypertension   . Hypothyroid   . Low back pain   . Lung cancer ([place]) 05/09/2011   RUL  . Lung cancer, upper lobe ([place]) 09/02/2012   rul  . Shingles     Patient Active Problem List   Diagnosis Date Noted  . 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 Constipation 05/18/2015  . Chronic systolic heart failure ([place]) 11/06/2014  . Acute on chronic systolic CHF (congestive heart failure) ([place])   . HCAP (healthcare-associated pneumonia)   . Acute renal failure syndrome ([place])   . Other specified hypothyroidism   . Cancer of upper lobe of right lung ([place])   . Tobacco abuse   . Acute respiratory failure with hypoxia ([place]) 10/26/2014  . Hypothyroidism 10/26/2014  . Acute respiratory failure ([place]) 10/26/2014  . COPD exacerbation ([place]) 10/25/2014  . Lung cancer ([place]) 05/09/2011  . THROMBOCYTOSIS 08/29/2010  . GERD 08/12/2010  . OSTEOPENIA 03/16/2009  . COLONIC POLYPS 03/11/2009  . HYPOKALEMIA 07/07/2008  . DEPRESSION 03/15/2007  . Essential hypertension 03/15/2007  . 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 OSTEOARTHRITIS,  FINGERS 03/15/2007    Past Surgical History:  Procedure Laterality Date  . endobronchial biopsy Right 05/03/2011  . TONSILLECTOMY    . TOTAL ABDOMINAL HYSTERECTOMY  1986  . TUBAL LIGATION  1984    OB History    No data available       Home Medications    Prior to Admission medications   Medication Sig Start Date End Date Taking? Authorizing Provider  ADVAIR DISKUS 250-50 MCG/DOSE AEPB Inhale 1 puff into the lungs 2 (two) times daily. 05/11/15   Historical Provider, MD  albuterol (VENTOLIN HFA) 108 (90 BASE) MCG/ACT inhaler Inhale 2 puffs into the lungs every 6 (six) hours as needed for wheezing or shortness of breath.     Historical Provider, MD  aspirin EC 81 MG EC tablet Take 1 tablet (81 mg total) by mouth daily. 10/30/14   Shanker Kristeen Mans, MD  gabapentin (NEURONTIN) 300 MG capsule Take 300 mg by mouth 2 (two) times daily. 01/27/16   Historical Provider, MD  ipratropium (ATROVENT HFA) 17 MCG/ACT inhaler Inhale 2 puffs into the lungs every 6 (six) hours.  Historical Provider, MD  ipratropium-albuterol (DUONEB) 0.5-2.5 (3) MG/3ML SOLN Take 3 mLs by nebulization every 6 (six) hours. 10/30/14   Shanker Kristeen Mans, MD  Levetiracetam (KEPPRA XR) 750 MG TB24 Take 1 tablet (750 mg total) by mouth at bedtime. 11/10/15   Marcial Pacas, MD  levothyroxine (SYNTHROID, LEVOTHROID) 25 MCG tablet Take 25 mcg by mouth daily. 12/23/15   Historical Provider, MD  levothyroxine (SYNTHROID, LEVOTHROID) 75 MCG tablet Take 75 mcg by mouth daily. 07/23/15   Historical Provider, MD  omeprazole (PRILOSEC) 20 MG capsule Take 20 mg by mouth daily. 12/31/15   Historical Provider, MD  simvastatin (ZOCOR) 20 MG tablet Take 1 tablet (20 mg total) by mouth daily. PLEASE CONTACT OFFICE FOR ADDITIONAL REFILLS 02/02/16   Minus Breeding, MD  spironolactone (ALDACTONE) 25 MG tablet Take 0.5 tablets (12.5 mg total) by mouth daily. PLEASE CONTACT OFFICE FOR ADDITIONAL REFILLS 02/21/16   Minus Breeding, MD  Vitamin D, Ergocalciferol,  (DRISDOL) 50000 UNITS CAPS capsule Take 50,000 Units by mouth every 7 (seven) days. Monday    Historical Provider, MD    Family History Family History  Problem Relation Age of Onset  . Esophageal cancer Father   . Hypothyroidism Father   . Cancer Father     esophageal  . Heart disease Mother   . Hyperlipidemia Mother   . Hypertension Mother     heart disease  . Other Brother     low back pain  . Cancer Brother     esophageal  . Other Brother     hypochondriasis  . Other Brother     suicide  . Esophageal cancer Brother   . Cervical cancer Sister   . Cancer Sister     cervical    Social History Social History  Substance Use Topics  . Smoking status: Current Some Day Smoker    Packs/day: 1.00    Years: 50.00    Types: Cigarettes    Last attempt to quit: 11/22/2014  . Smokeless tobacco: Never Used     Comment: Smokes 1 pack per week  . Alcohol use No     Allergies   Codeine   Review of Systems Review of Systems  Constitutional: Positive for appetite change (decreased). Negative for chills and fever.  HENT: Negative for facial swelling.   Eyes: Negative for visual disturbance.  Respiratory: Positive for cough, shortness of breath and wheezing.   Cardiovascular: Negative for chest pain and leg swelling.  Gastrointestinal: Positive for constipation (chronic, unchanged). Negative for abdominal pain, diarrhea, nausea and vomiting.  Genitourinary: Positive for decreased urine volume. Negative for difficulty urinating (no incontinence), dysuria and hematuria.  Musculoskeletal: Positive for arthralgias (L hip). Negative for back pain, joint swelling, myalgias and neck pain.  Skin: Negative for color change and wound.  Allergic/Immunologic: Negative for immunocompromised state.  Neurological: Negative for syncope, weakness, numbness and headaches.  Hematological: Does not bruise/bleed easily.  Psychiatric/Behavioral: Negative for confusion.   10 Systems reviewed and  are negative for acute change except as noted in the HPI.   Physical Exam Updated Vital Signs BP 118/88 (BP Location: Right Arm)   Pulse 110   Temp 97.6 F (36.4 C) (Oral)   Resp 18   Ht '5\' 4"'$  (1.626 m)   Wt 57.2 kg   SpO2 94%   BMI 21.63 kg/m   Physical Exam  Constitutional: She is oriented to person, place, and time. Vital signs are normal. She appears well-developed and well-nourished.  Non-toxic appearance. No distress.  Afebrile, nontoxic, NAD although very audibly wheezing  HENT:  Head: Normocephalic and atraumatic. Head is without raccoon's eyes, without Battle's sign, without abrasion and without contusion.  Right Ear: Hearing, tympanic membrane, external ear and ear canal normal.  Left Ear: Hearing, tympanic membrane, external ear and ear canal normal.  Mouth/Throat: Oropharynx is clear and moist and mucous membranes are normal.  No scalp crepitus or deformities, no scalp or facial TTP, no otorrhea or rhinorrhea, no raccoon eyes or battle's sign, no abrasions/contusions of the face/scalp, no hemotympanum although R ear canal with moderate cerumen build up so limited view of TM; no s/sx of basilar skull fx  Eyes: Conjunctivae and EOM are normal. Pupils are equal, round, and reactive to light. Right eye exhibits no discharge. Left eye exhibits no discharge.  PERRL, EOMI, no nystagmus  Neck: Normal range of motion. Neck supple. No spinous process tenderness and no muscular tenderness present. No neck rigidity. Normal range of motion present.  FROM intact without spinous process TTP, no bony stepoffs or deformities, no paraspinous muscle TTP or muscle spasms. No rigidity or meningeal signs. No bruising or swelling.   Cardiovascular: Regular rhythm, normal heart sounds and intact distal pulses.  Tachycardia present.  Exam reveals no gallop and no friction rub.   No murmur heard. Very mildly tachycardic, similar to outpatient visits; reg rhythm, nl s1/s2, no m/r/g audible but  difficult to hear due to lung sounds obscuring great auscultation abilities. Distal pulses intact, no pedal edema  Pulmonary/Chest: No respiratory distress. She has no decreased breath sounds. She has wheezes. She has rhonchi. She has no rales.  Diffuse expiratory and inspiratory wheezing throughout, with scattered rhonchi throughout all lung fields, no hypoxia but slightly increased WOB, speaking in semi-full sentences, SpO2 93-94% on RA   Abdominal: Soft. Normal appearance and bowel sounds are normal. She exhibits no distension. There is no tenderness. There is no rigidity, no rebound, no guarding, no CVA tenderness, no tenderness at McBurney's point and negative Murphy's sign.  Musculoskeletal:       Left hip: She exhibits decreased range of motion, tenderness and bony tenderness. She exhibits no swelling, no crepitus and no deformity.  L hip with limited ROM due to pain, with moderate lateral joint line TTP, no bruising or swelling, no crepitus or deformity, no limb length discrepancy or abnormal rotation. +Pain with log roll testing. C-spine as above, all other spinal levels nonTTP without bony stepoffs or deformities  No pelvic instability No other focal bony TTP in remainder of extremities Strength and sensation grossly intact in all extremities, distal pulses intact, compartments soft.   Neurological: She is alert and oriented to person, place, and time. She has normal strength. No cranial nerve deficit or sensory deficit. Coordination and gait normal. GCS eye subscore is 4. GCS verbal subscore is 5. GCS motor subscore is 6.  CN 2-12 grossly intact A&O x4 GCS 15 Sensation and strength intact Gait not assessed due to hip injury/pain Coordination WNL Neg pronator drift   Skin: Skin is warm, dry and intact. No abrasion, no bruising and no rash noted.  No bruising/abrasions noted over all exposed surfaces  Psychiatric: She has a normal mood and affect.  Nursing note and vitals  reviewed.    ED Treatments / Results  Labs (all labs ordered are listed, but only abnormal results are displayed) Labs Reviewed  CBC WITH DIFFERENTIAL/PLATELET - Abnormal; Notable for the following:       Result Value   Neutro Abs  8.7 (*)    Lymphs Abs 0.6 (*)    All other components within normal limits  BRAIN NATRIURETIC PEPTIDE - Abnormal; Notable for the following:    B Natriuretic Peptide 305.0 (*)    All other components within normal limits  COMPREHENSIVE METABOLIC PANEL - Abnormal; Notable for the following:    Sodium 129 (*)    Chloride 93 (*)    Glucose, Bld 110 (*)    Albumin 3.3 (*)    All other components within normal limits  I-STAT ARTERIAL BLOOD GAS, ED - Abnormal; Notable for the following:    pO2, Arterial 174.0 (*)    All other components within normal limits  URINALYSIS, ROUTINE W REFLEX MICROSCOPIC (NOT AT [facility])  I-STAT TROPOININ, ED  I-STAT CG4 LACTIC ACID, ED    EKG  EKG Interpretation  Date/Time:  Wednesday May 10 2016 10:54:46 EST Ventricular Rate:  106 PR Interval:    QRS Duration: 93 QT Interval:  399 QTC Calculation: 530 R Axis:   -25 Text Interpretation:  Sinus tachycardia Borderline left axis deviation Nonspecific T abnormalities, lateral leads Prolonged QT interval T wave changes in lateral leads have improved since prior, no other significant changes Confirmed by [facility] MD, ERIN ([id number]) on 05/10/2016 1:32:52 PM       Radiology Dg Chest 2 View  Result Date: 05/10/2016 CLINICAL DATA:  Status post falling out of bed yesterday striking the left hip. The patient reports worsening shortness of breath and nonproductive cough. History of COPD. Current smoker. History of lung malignancy. EXAM: CHEST  2 VIEW COMPARISON:  CT scan of the chest of February 07, 2016 FINDINGS: The lungs are hyperinflated. There are chronic changes in the right suprahilar region not greatly changed from the previous study. There is no alveolar infiltrate. There  is no pleural effusion. The heart and pulmonary vascularity are normal. There is mild shift of mediastinum toward the right. The observed bony thorax exhibits no acute abnormality. IMPRESSION: COPD. Chronic right upper lobe changes consistent with known malignancy. There has not been dramatic radiographic change since the CT scan of February 07, 2016 but direct comparison of today's chest x-ray with the previous CT scan is of limited utility. Repeat chest CT scanning is recommended. COPD.  No CHF.  The observed bony thorax is unremarkable. Electronically Signed   By: David  Martin[place] M.D.   On: 05/10/2016 11:59   Ct Hip Left Wo Contrast  Result Date: 05/10/2016 CLINICAL DATA:  Proximal left femur fracture secondary to a fall yesterday. EXAM: CT OF THE LEFT HIP WITHOUT CONTRAST TECHNIQUE: Multidetector CT imaging of the left hip was performed according to the standard protocol. Multiplanar CT image reconstructions were also generated. COMPARISON:  Radiographs dated 05/10/2016 FINDINGS: Bones/Joint/Cartilage There is an impacted slightly comminuted intertrochanteric and low left femoral neck fracture with minimal angulation and minimal displacement. The visualized pelvic bones are intact. Joint spaces well-preserved. Adjacent soft tissues are normal. IMPRESSION: Impacted slightly comminuted intertrochanteric and low femoral neck fracture on the left. Electronically Signed   By: Lorriane Shire M.D.   On: 05/10/2016 13:29   Dg Hip Unilat W Or Wo Pelvis 2-3 Views Left  Result Date: 05/10/2016 CLINICAL DATA:  Left hip pain after fall yesterday. EXAM: DG HIP (WITH OR WITHOUT PELVIS) 2-3V LEFT COMPARISON:  None. FINDINGS: There appears to be cortical disruption involving the greater trochanter concerning for mildly displaced fracture. The left femoral head and neck appear intact. No dislocation is noted. IMPRESSION: Probable mildly displaced fracture  involving greater trochanter of proximal left femur. CT scan of the  left hip is recommended for further evaluation. Electronically Signed   By: Marijo Conception, M.D.   On: 05/10/2016 12:01    Procedures Procedures (including critical care time)  Medications Ordered in ED Medications  albuterol (PROVENTIL,VENTOLIN) solution continuous neb (10 mg/hr Nebulization New Bag/Given 05/10/16 1324)  azithromycin (ZITHROMAX) 500 mg in dextrose 5 % 250 mL IVPB (not administered)  albuterol (PROVENTIL) (2.5 MG/3ML) 0.083% nebulizer solution 5 mg (5 mg Nebulization Given 05/10/16 1052)  ipratropium (ATROVENT) nebulizer solution 0.5 mg (0.5 mg Nebulization Given 05/10/16 1052)  fentaNYL (SUBLIMAZE) injection 50 mcg (50 mcg Intravenous Given 05/10/16 1051)  ipratropium-albuterol (DUONEB) 0.5-2.5 (3) MG/3ML nebulizer solution 3 mL (3 mLs Nebulization Given 05/10/16 1050)  cefTRIAXone (ROCEPHIN) 1 g in dextrose 5 % 50 mL IVPB (1 g Intravenous New Bag/Given 05/10/16 1257)     Initial Impression / Assessment and Plan / ED Course  I have reviewed the triage vital signs and the nursing notes.  Pertinent labs & imaging results that were available during my care of the patient were reviewed by me and considered in my medical decision making (see chart for details).  Clinical Course     70 y.o. female here with multiple issues; primary issue is that she fell last night off the bed and landed on her L hip, hasn't been able to move leg since then. Secondarily she's also had SOB over the last several weeks, was on prednisone x10 days then stopped for 4 days and put back on it yesterday, with augmentin as well. She didn't urinate yesterday 2/2 not being able to get to the toilet due to the hip pain, and due to not eating/drinking much lately. No incontinence, and is attempting to urinate on bedpan. No head trauma, no LOC, no focal neuro deficits. L hip with moderate TTP laterally and with log-roll testing. Lung exam with inspiratory and expiratory wheezing, and scattered rhonchi. No LE  swelling. Given duoneb, will repeat another breathing tx now; already had prednisone today, will hold off on solumedrol or any other steroid for now; will give fentanyl for pain; get CBC, CMP, BNP, U/A, trop, EKG, CXR, lactic, ABG, and L hip xray. Per canadian head CT rules pt doesn't need head imaging. Will hold of on any other imaging for now, if xray neg of L hip, will likely need CT scan, but will reassess after work up completed. Discussed case with my attending Dr. Billy Fischer who agrees with plan.   12:29 PM CBC w/diff WNL. CMP with mildly low Na 129 but otherwise WNL. BNP 305 which is better than prior values. Trop neg. EKG not yet done. ABG reassuring. Lactic WNL. CXR with chronic bronchitic findings, unchanged from recent CT although mentions possible repeat CT scan; doubt this is emergently necessary, since clinically she seems like ?PNA vs COPD exacerbation; could consider this nonemergently at a later date. L hip xray with probable mildly displaced fx of greater trochanter, CT recommended; will get this now. Lung sounds still very poor after 2nd duoneb, will start continuous albuterol; now needing supplemental O2 to maintain sats >90% at rest. Will empirically cover for CAP since pt seems clinically to have possible PNA vs COPD exacerbation; no recent hospitalization or nursing home care. Wasn't able to urinate, states she has a hard time urinating laying down, bladder scan showing ~316m; not a tremendous amount, will have her try again on the bedpan, but doubt this is acute  urinary retention; if she is retaining urine then this may need further intervention/imaging as well. Will reassess after bladder scan, U/A, and CT L hip. Definite admission will be needed.   1:33 PM Dr. Erlinda Hong returning consult page, will consult on pt while she's in the hospital. CT showing comminuted intertrochanteric and low femoral neck fx. EKG with sinus tachycardia but otherwise essentially unchanged from prior other than  improvement of T wave changes. Will proceed with admission. Of note, pt had not yet been placed back on the bedpan to attempt to urinate, so no U/A has been collected. Currently receiving her continuous neb tx.   1:46 PM Dr. Marily Memos of [facility] returning page and will admit. Holding orders placed. Please see their notes for further documentation of care. I appreciate their help with this pleasant pt's care. Pt stable at time of admission.    Final Clinical Impressions(s) / ED Diagnoses   Final diagnoses:  Acute on chronic respiratory failure with hypoxia ([place])  COPD exacerbation ([place])  Fall, initial encounter  Shortness of breath  Closed fracture of left hip, initial encounter ([place])  Community acquired pneumonia, unspecified laterality    New Prescriptions New Prescriptions   No medications on file     [place], PA-C 05/10/16 [place], MD 05/11/16 2249

## 2016-05-10 NOTE — ED Notes (Signed)
Bladder scan 362, edp notified

## 2016-05-10 NOTE — ED Notes (Signed)
Pt removed form bed pan. Still unable to void bladder. RN aware.

## 2016-05-10 NOTE — Consult Note (Signed)
ORTHOPAEDIC CONSULTATION  REQUESTING PHYSICIAN: Waldemar Dickens, MD  Chief Complaint: Left intertroch hip fracture  HPI: Janice Daniels is a 70 y.o. female who presents with left intertroch hip fracture s/p mechanical fall PTA.  The patient endorses severe pain in the left hip, that does not radiate, grinding in quality, worse with any movement, better with immobilization.  Denies LOC/fever/chills/nausea/vomiting.  Walks without assistive devices (walker, cane, wheelchair).  Does live independently.  Denies LOC, neck pain, abd pain.  Past Medical History:  Diagnosis Date  . Acute on chronic systolic CHF (congestive heart failure) ([place])   . Arthritis    "back" (05/10/2016)  . Chronic bronchitis ([place])   . COPD (chronic obstructive pulmonary disease) ([place])   . Depression   . GERD (gastroesophageal reflux disease)   . Heart murmur   . History of chemotherapy    carboplatin and etoposide  . History of radiation therapy 06/05/11 to 07/18/11   lung  . History of radiation therapy 09/13/11 to 09/26/11   brain  . Hx of radiation therapy 10/03/12 - 10/09/12   RUL lung  . Hypertension   . Hypothyroid   . Low back pain   . Lung cancer ([place]) 05/09/2011   RUL  . Lung cancer, upper lobe ([place]) 09/02/2012   rul  . Shingles   . Stroke [facility])    "they said ~ 2016; they don't really know when I had it" (05/10/2016)   Past Surgical History:  Procedure Laterality Date  . FIBEROPTIC BRONCHOSCOPY Right 05/03/2011   endobronchial biopsy  . TONSILLECTOMY    . TUBAL LIGATION  1984  . VAGINAL HYSTERECTOMY  1986   Social History   Social History  . Marital status: Married    Spouse name: [name]  . Number of children: 5  . Years of education: 12   Occupational History  . retired Quarry manager    Social History Main Topics  . Smoking status: Current Some Day Smoker    Packs/day: 0.50    Years: 55.00    Types: Cigarettes    Last attempt to quit: 11/22/2014  . Smokeless tobacco: Never Used  .  Alcohol use No  . Drug use: No  . Sexual activity: Not Asked   Other Topics Concern  . None   Social History Narrative   She is married to [place] and had 5 sons.  She had a daughter who died at 52 months.  Her son, Heron Sabins, died at age 21 from alcoholism and pancreas disease.  Her son, Merry Proud, is 51 was born [birth year] and is alive and well.  Her son, Clair Gulling, is 8, born 57, and has bipolar disorder.  Her son, Jenny Reichmann, is 33, born [birth year], and has kidney disease.  Her son, Corene Cornea, is age 82, born [birth year], and is in the [place].    Right-handed.   2-4 cups caffeine per day.   Family History  Problem Relation Age of Onset  . Esophageal cancer Father   . Hypothyroidism Father   . Cancer Father     esophageal  . Heart disease Mother   . Hyperlipidemia Mother   . Hypertension Mother     heart disease  . Other Brother     low back pain  . Cancer Brother     esophageal  . Other Brother     hypochondriasis  . Other Brother     suicide  . Esophageal cancer Brother   . Cervical cancer Sister   .  Cancer Sister     cervical   Allergies  Allergen Reactions  . Codeine Other (See Comments)    Elevated pulse   Prior to Admission medications   Medication Sig Start Date End Date Taking? Authorizing Provider  ADVAIR DISKUS 250-50 MCG/DOSE AEPB Inhale 1 puff into the lungs 2 (two) times daily. 05/11/15  Yes Historical Provider, MD  albuterol (VENTOLIN HFA) 108 (90 BASE) MCG/ACT inhaler Inhale 2 puffs into the lungs every 6 (six) hours as needed for wheezing or shortness of breath.    Yes Historical Provider, MD  aspirin EC 81 MG EC tablet Take 1 tablet (81 mg total) by mouth daily. 10/30/14  Yes Shanker Kristeen Mans, MD  gabapentin (NEURONTIN) 300 MG capsule Take 300 mg by mouth 2 (two) times daily. 01/27/16  Yes Historical Provider, MD  ipratropium (ATROVENT HFA) 17 MCG/ACT inhaler Inhale 2 puffs into the lungs every 6 (six) hours.   Yes Historical Provider, MD  ipratropium-albuterol (DUONEB) 0.5-2.5  (3) MG/3ML SOLN Take 3 mLs by nebulization every 6 (six) hours. 10/30/14  Yes Shanker Kristeen Mans, MD  Levetiracetam (KEPPRA XR) 750 MG TB24 Take 1 tablet (750 mg total) by mouth at bedtime. 11/10/15  Yes Marcial Pacas, MD  levothyroxine (SYNTHROID, LEVOTHROID) 25 MCG tablet Take 25 mcg by mouth daily. 12/23/15  Yes Historical Provider, MD  omeprazole (PRILOSEC) 20 MG capsule Take 20 mg by mouth daily. 12/31/15  Yes Historical Provider, MD  simvastatin (ZOCOR) 20 MG tablet Take 1 tablet (20 mg total) by mouth daily. PLEASE CONTACT OFFICE FOR ADDITIONAL REFILLS 02/02/16  Yes Minus Breeding, MD  spironolactone (ALDACTONE) 25 MG tablet Take 0.5 tablets (12.5 mg total) by mouth daily. PLEASE CONTACT OFFICE FOR ADDITIONAL REFILLS 02/21/16  Yes Minus Breeding, MD  Vitamin D, Ergocalciferol, (DRISDOL) 50000 UNITS CAPS capsule Take 50,000 Units by mouth every 7 (seven) days. Monday   Yes Historical Provider, MD  levothyroxine (SYNTHROID, LEVOTHROID) 75 MCG tablet Take 75 mcg by mouth daily. 07/23/15   Historical Provider, MD   Dg Chest 2 View  Result Date: 05/10/2016 CLINICAL DATA:  Status post falling out of bed yesterday striking the left hip. The patient reports worsening shortness of breath and nonproductive cough. History of COPD. Current smoker. History of lung malignancy. EXAM: CHEST  2 VIEW COMPARISON:  CT scan of the chest of February 07, 2016 FINDINGS: The lungs are hyperinflated. There are chronic changes in the right suprahilar region not greatly changed from the previous study. There is no alveolar infiltrate. There is no pleural effusion. The heart and pulmonary vascularity are normal. There is mild shift of mediastinum toward the right. The observed bony thorax exhibits no acute abnormality. IMPRESSION: COPD. Chronic right upper lobe changes consistent with known malignancy. There has not been dramatic radiographic change since the CT scan of February 07, 2016 but direct comparison of today's chest x-ray with the  previous CT scan is of limited utility. Repeat chest CT scanning is recommended. COPD.  No CHF.  The observed bony thorax is unremarkable. Electronically Signed   By: David  Martinique M.D.   On: 05/10/2016 11:59   Ct Hip Left Wo Contrast  Result Date: 05/10/2016 CLINICAL DATA:  Proximal left femur fracture secondary to a fall yesterday. EXAM: CT OF THE LEFT HIP WITHOUT CONTRAST TECHNIQUE: Multidetector CT imaging of the left hip was performed according to the standard protocol. Multiplanar CT image reconstructions were also generated. COMPARISON:  Radiographs dated 05/10/2016 FINDINGS: Bones/Joint/Cartilage There is an impacted slightly comminuted  intertrochanteric and low left femoral neck fracture with minimal angulation and minimal displacement. The visualized pelvic bones are intact. Joint spaces well-preserved. Adjacent soft tissues are normal. IMPRESSION: Impacted slightly comminuted intertrochanteric and low femoral neck fracture on the left. Electronically Signed   By: Lorriane Shire M.D.   On: 05/10/2016 13:29   Dg Hip Unilat W Or Wo Pelvis 2-3 Views Left  Result Date: 05/10/2016 CLINICAL DATA:  Left hip pain after fall yesterday. EXAM: DG HIP (WITH OR WITHOUT PELVIS) 2-3V LEFT COMPARISON:  None. FINDINGS: There appears to be cortical disruption involving the greater trochanter concerning for mildly displaced fracture. The left femoral head and neck appear intact. No dislocation is noted. IMPRESSION: Probable mildly displaced fracture involving greater trochanter of proximal left femur. CT scan of the left hip is recommended for further evaluation. Electronically Signed   By: Marijo Conception, M.D.   On: 05/10/2016 12:01    All pertinent xrays, MRI, CT independently reviewed and interpreted  Positive ROS: All other systems have been reviewed and were otherwise negative with the exception of those mentioned in the HPI and as above.  Physical Exam: General: Alert, no acute  distress Cardiovascular: No pedal edema Respiratory: No cyanosis, no use of accessory musculature GI: No organomegaly, abdomen is soft and non-tender Skin: No lesions in the area of chief complaint Neurologic: Sensation intact distally Psychiatric: Patient is competent for consent with normal mood and affect Lymphatic: No axillary or cervical lymphadenopathy  MUSCULOSKELETAL:  - pain with movement of the hip and extremity - skin intact - NVI distally - compartments soft  Assessment: Left intertroch hip fracture  Plan: - surgery is recommended, patient and family are aware of r/b/a and wish to proceed - CT scan shows nondisplaced intertroch fx - consent obtained - medical optimization per primary team - surgery is planned for Thursday pm - Based on history and fracture pattern this likely represents a fragility fracture. - Fragility fractures affect up to one half of women and one third of men after age 20 years and occur in the setting of bone disorder such as osteoporosis or osteopenia and warrant appropriate work-up. - The following are general recommendations that may serve as an outline for an appropriate work-up:  1.) Obtain bone density measurement to confirm presumptive diagnosis, assess severity of osteoporosis and risk of future fracture, and use as baseline for monitoring treatment  2.) Obtain laboratory tests: CBC, ESR, serum calcium, creatinine, albumin,phosphate, alkaline phosphatase, liver transaminases, protein electrophoresis, urinalysis, 25-hydroxyvitamin D.  3.) Exclude secondary causes of low bone mass and skeletal fragility (eg,multiple myeloma, lymphoma) as indicated.  4.) Obtain radiograph of thoracic and lumbar spine, particularly among individuals with back pain or height loss to assess presence of vertebral fractures  5.) Intermittent administration of recombinant human parathyroid hormone  6.) Optimize nutritional status using nutritional  supplementation.  7.) Patient/family education to prevent future falls.  8.) Early mobilization and exercise program - exercise decreases the rate of bone loss and has been associated with decreased rate of fragility fractures   Thank you for the consult and the opportunity to see Ms. [place]  N. Eduard Roux, MD [place] 3:55 PM

## 2016-05-10 NOTE — ED Notes (Signed)
Pt advised that she wasn't able to void bladder. Bed pan causing increased hip pain. RN aware.

## 2016-05-10 NOTE — ED Notes (Signed)
Pt back on bedpan.

## 2016-05-10 NOTE — ED Notes (Signed)
Placed patient into a gown and on the monitor waiting on provider

## 2016-05-10 NOTE — Progress Notes (Addendum)
Pharmacy Antibiotic Note  Janice Daniels is a 70 y.o. female admitted on 05/10/2016 with L-hip pain s/p fall and SOB concerning for COPD exacerbation.  Pharmacy has been consulted for Levaquin dosing. Rocephin and Azithromycin given in ED at 1400 PM. WBC is within normal limits. Lactate within normal limits. Creatinine is consistent with prior admission.  Plan: Levaquin '500mg'$  IV every 24 hours -1st dose tomorrow due to recent Rocephin/Azithromycin today. Monitor renal function, culture results, and clinical status.   Height: '5\' 4"'$  (162.6 cm) Weight: 126 lb (57.2 kg) IBW/kg (Calculated) : 54.7  Temp (24hrs), Avg:97.7 F (36.5 C), Min:97.6 F (36.4 C), Max:97.7 F (36.5 C)   Recent Labs Lab 05/10/16 1044 05/10/16 1108 05/10/16 1452  WBC 9.8  --   --   CREATININE 0.91  --  1.10*  LATICACIDVEN  --  1.40  --     Estimated Creatinine Clearance: 40.5 mL/min (by C-G formula based on SCr of 1.1 mg/dL (H)).    Allergies  Allergen Reactions  . Codeine Other (See Comments)    Elevated pulse    Antimicrobials this admission: 11/15 Azithromycin x1 dose in ED 11/15 Rocephin x1 dose in ED 11/15 Levaquin >>   Dose adjustments this admission: none  Microbiology results: None at this time  Thank you for allowing pharmacy to be a part of this patient's care.  Sloan Leiter, PharmD, BCPS Clinical Pharmacist [phone] until [phone] after hours 05/10/2016 5:23 PM

## 2016-05-10 NOTE — ED Triage Notes (Addendum)
Pt comes in with husband who states that pt. Has been not eating regularly since Friday.  Pt was recently started on Prednisone for her breathing.  Pt also recently started on a second fluid pill as per husband pt is not urinating last time she urinated was yesterday and no BM since last week

## 2016-05-11 ENCOUNTER — Encounter (HOSPITAL_COMMUNITY): Payer: Self-pay | Admitting: Certified Registered Nurse Anesthetist

## 2016-05-11 DIAGNOSIS — Z720 Tobacco use: Secondary | ICD-10-CM | POA: Diagnosis not present

## 2016-05-11 DIAGNOSIS — S72002A Fracture of unspecified part of neck of left femur, initial encounter for closed fracture: Secondary | ICD-10-CM | POA: Diagnosis not present

## 2016-05-11 DIAGNOSIS — I5022 Chronic systolic (congestive) heart failure: Secondary | ICD-10-CM | POA: Diagnosis not present

## 2016-05-11 DIAGNOSIS — J441 Chronic obstructive pulmonary disease with (acute) exacerbation: Secondary | ICD-10-CM

## 2016-05-11 DIAGNOSIS — R0602 Shortness of breath: Secondary | ICD-10-CM | POA: Diagnosis not present

## 2016-05-11 DIAGNOSIS — R0603 Acute respiratory distress: Secondary | ICD-10-CM | POA: Diagnosis not present

## 2016-05-11 DIAGNOSIS — J432 Centrilobular emphysema: Secondary | ICD-10-CM

## 2016-05-11 DIAGNOSIS — J9621 Acute and chronic respiratory failure with hypoxia: Secondary | ICD-10-CM | POA: Diagnosis not present

## 2016-05-11 LAB — CBC
HEMATOCRIT: 36.1 % (ref 36.0–46.0)
HEMOGLOBIN: 12.2 g/dL (ref 12.0–15.0)
MCH: 31 pg (ref 26.0–34.0)
MCHC: 33.8 g/dL (ref 30.0–36.0)
MCV: 91.6 fL (ref 78.0–100.0)
Platelets: 260 10*3/uL (ref 150–400)
RBC: 3.94 MIL/uL (ref 3.87–5.11)
RDW: 13.4 % (ref 11.5–15.5)
WBC: 9.9 10*3/uL (ref 4.0–10.5)

## 2016-05-11 LAB — SURGICAL PCR SCREEN
MRSA, PCR: NEGATIVE
STAPHYLOCOCCUS AUREUS: POSITIVE — AB

## 2016-05-11 LAB — BASIC METABOLIC PANEL
Anion gap: 9 (ref 5–15)
BUN: 10 mg/dL (ref 6–20)
CHLORIDE: 94 mmol/L — AB (ref 101–111)
CO2: 25 mmol/L (ref 22–32)
Calcium: 9.1 mg/dL (ref 8.9–10.3)
Creatinine, Ser: 0.92 mg/dL (ref 0.44–1.00)
GFR calc Af Amer: >60 mL/min (ref >60)
GFR calc non Af Amer: >60 mL/min (ref >60)
Glucose, Bld: 139 mg/dL — ABNORMAL HIGH (ref 65–99)
POTASSIUM: 4.2 mmol/L (ref 3.5–5.1)
SODIUM: 128 mmol/L — AB (ref 135–145)

## 2016-05-11 MED ORDER — ALBUTEROL SULFATE (2.5 MG/3ML) 0.083% IN NEBU
2.5000 mg | INHALATION_SOLUTION | RESPIRATORY_TRACT | Status: DC | PRN
  Administered 2016-05-19 – 2016-05-24 (×5): 2.5 mg via RESPIRATORY_TRACT
  Filled 2016-05-11 (×5): qty 3

## 2016-05-11 MED ORDER — ALBUTEROL SULFATE (2.5 MG/3ML) 0.083% IN NEBU: 2.5000 mg | INHALATION_SOLUTION | RESPIRATORY_TRACT | Status: DC | PRN

## 2016-05-11 MED ORDER — ARFORMOTEROL TARTRATE 15 MCG/2ML IN NEBU
15.0000 ug | INHALATION_SOLUTION | Freq: Two times a day (BID) | RESPIRATORY_TRACT | Status: DC
  Administered 2016-05-11 – 2016-05-25 (×27): 15 ug via RESPIRATORY_TRACT
  Filled 2016-05-11 (×27): qty 2

## 2016-05-11 MED ORDER — BUDESONIDE 0.5 MG/2ML IN SUSP
0.5000 mg | Freq: Two times a day (BID) | RESPIRATORY_TRACT | Status: DC
  Administered 2016-05-11 – 2016-05-25 (×27): 0.5 mg via RESPIRATORY_TRACT
  Filled 2016-05-11 (×29): qty 2

## 2016-05-11 MED ORDER — GUAIFENESIN ER 600 MG PO TB12
1200.0000 mg | ORAL_TABLET | Freq: Two times a day (BID) | ORAL | Status: DC
  Administered 2016-05-11 – 2016-05-24 (×25): 1200 mg via ORAL
  Filled 2016-05-11 (×28): qty 2

## 2016-05-11 MED ORDER — MUPIROCIN 2 % EX OINT
1.0000 "application " | TOPICAL_OINTMENT | Freq: Two times a day (BID) | CUTANEOUS | Status: AC
  Administered 2016-05-11 – 2016-05-15 (×10): 1 via NASAL
  Filled 2016-05-11: qty 22

## 2016-05-11 MED ORDER — MORPHINE SULFATE (PF) 2 MG/ML IV SOLN
2.0000 mg | INTRAVENOUS | Status: DC | PRN
  Administered 2016-05-11 – 2016-05-18 (×13): 2 mg via INTRAVENOUS
  Filled 2016-05-11 (×15): qty 1

## 2016-05-11 MED ORDER — IPRATROPIUM-ALBUTEROL 0.5-2.5 (3) MG/3ML IN SOLN
3.0000 mL | Freq: Four times a day (QID) | RESPIRATORY_TRACT | Status: DC
  Administered 2016-05-11 – 2016-05-14 (×11): 3 mL via RESPIRATORY_TRACT
  Filled 2016-05-11 (×11): qty 3

## 2016-05-11 MED ORDER — OXYCODONE-ACETAMINOPHEN 5-325 MG PO TABS
1.0000 | ORAL_TABLET | Freq: Four times a day (QID) | ORAL | Status: DC | PRN
  Administered 2016-05-11 – 2016-05-17 (×12): 1 via ORAL
  Filled 2016-05-11 (×13): qty 1

## 2016-05-11 MED ORDER — CHLORHEXIDINE GLUCONATE CLOTH 2 % EX PADS
6.0000 | MEDICATED_PAD | Freq: Every day | CUTANEOUS | Status: AC
  Administered 2016-05-11 – 2016-05-14 (×4): 6 via TOPICAL

## 2016-05-11 MED ORDER — HYDROCOD POLST-CPM POLST ER 10-8 MG/5ML PO SUER
5.0000 mL | Freq: Two times a day (BID) | ORAL | Status: DC | PRN
Start: 2016-05-11 — End: 2016-05-25
  Administered 2016-05-11 – 2016-05-24 (×4): 5 mL via ORAL
  Filled 2016-05-11 (×4): qty 5

## 2016-05-11 NOTE — Progress Notes (Signed)
Pt now says that she wants to have the morphine,MD paged

## 2016-05-11 NOTE — Progress Notes (Signed)
Pt prepped and ready for surgery but received a call from hospitalist that pt had to be evaluated and cleared by pulmonary specialist for surgery. OR notified

## 2016-05-11 NOTE — Progress Notes (Signed)
PROGRESS NOTE    Janice Daniels  NFA:[id number] DOB: 06/23/1946 DOA: 05/10/2016 PCP: Donnajean Lopes, MD   Brief Narrative: Janice Daniels is a 70 y.o. 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 She presented with a COPD exacerbation and left hip fracture.   Assessment & Plan:   Principal Problem:   Closed left hip fracture (HCC) Active Problems:   Essential hypertension   COPD (chronic obstructive pulmonary disease) with emphysema (HCC)   GERD   Lung cancer (HCC)   COPD exacerbation (HCC)   Hypothyroidism   Other specified hypothyroidism   Tobacco abuse   Chronic systolic heart failure (HCC)   Hyponatremia   Acute respiratory distress   Closed hip fracture, left -orthopedic recommendations: surgery (postponed secondary to respiratory status) -analgesics  COPD exacerbation Acute respiratory failure with hypoxia Significant on my exam. Patient with significant work of breathing. -continue nebs -continue steroids -continue levaquin -O2 via [place]; wean as tolerated -pulmonology consulted for surgery clearance  Chronic systolic heart failure Appears euvolemic. Last EF of 35-40%, mild LVH, diffuse hypokinesis. On spironolactone. -in/out -daily weights  Hyponatremia Sodium slightly decreased today. Spironolactone held. Serum osmolality lower end of normal. -continue IV fluids  Hypertension Not on medication therapy  Hypothyroidism -continue synthroid  History of partial seizure Keppra held on admission -continue gabapentin   Tobacco abuse Counseling given  History of small cell lung cancer Diagnosed October 2012. Chart review indicates status post palliative radiotherapy, systemic chemotherapy prophylactic cranial irradiation, curative stereotactic radiotherapy.  Office visit note dated August 2017 [place] recent CT scan showed no evidence for disease progression septic for evolving radiation changes and masslike consolidation in the right upper lobe and posterior right upper lobe. Noted also indicates this is been evaluated with PET scan in the past and showed no hypermetabolic activity -Patient will follow-up in 6 months with oncology    DVT prophylaxis: SCDs Code Status: Full code Family Communication: Husband and daughter at bedside Disposition Plan: Pending improvement in respiratory status   Consultants:   Orthopedic surgery  Pulmonology  Procedures:   None  Antimicrobials:  Azithromycin (11/15)  Levaquin (11/15>>    Subjective: Patient reports dyspnea. No chest pain.  Objective: Vitals:   05/11/16 0325 05/11/16 0644 05/11/16 0744 05/11/16 1118  BP:  (!) 145/99    Pulse:  (!) 113    Resp:      Temp:  98 F (36.7 C)    TempSrc:  Oral    SpO2: 97% 98% 94% 90%  Weight:      Height:        Intake/Output Summary (Last 24 hours) at 05/11/16 1305 Last data filed at 05/10/16 2300  Gross per 24 hour  Intake          1071.25 ml  Output                0 ml  Net          1071.25 ml   Filed Weights   05/10/16 1023  Weight: 57.2 kg (126 lb)    Examination:  General exam: Appears calm and comfortable Respiratory system: diffuse wheezing and rhonchi with increased work of breathing Cardiovascular system: S1 & S2 heard, RRR. No murmurs, rubs, gallops or clicks. Gastrointestinal system: Abdomen is nondistended, soft and nontender. Normal bowel sounds heard. Central nervous system: Alert and oriented. No  focal neurological deficits. Extremities: No edema. No calf tenderness Skin: No cyanosis. No rashes Psychiatry: Judgement and insight appear normal. Mood & affect appropriate.     Data Reviewed: I have personally reviewed following labs and imaging studies  CBC:  Recent Labs Lab 05/10/16 1044 05/11/16 0710  WBC  9.8 9.9  NEUTROABS 8.7*  --   HGB 13.5 12.2  HCT 38.3 36.1  MCV 90.8 91.6  PLT 254 161   Basic Metabolic Panel:  Recent Labs Lab 05/10/16 1044 05/10/16 1452 05/11/16 0710  NA 129*  --  128*  K 4.0  --  4.2  CL 93*  --  94*  CO2 24  --  25  GLUCOSE 110*  --  139*  BUN 12  --  10  CREATININE 0.91 1.10* 0.92  CALCIUM 9.0  --  9.1   GFR: Estimated Creatinine Clearance: 48.4 mL/min (by C-G formula based on SCr of 0.92 mg/dL). Liver Function Tests:  Recent Labs Lab 05/10/16 1044  AST 32  ALT 21  ALKPHOS 55  BILITOT 0.9  PROT 6.9  ALBUMIN 3.3*   No results for input(s): LIPASE, AMYLASE in the last 168 hours. No results for input(s): AMMONIA in the last 168 hours. Coagulation Profile:  Recent Labs Lab 05/10/16 1452  INR 1.00   Cardiac Enzymes: No results for input(s): CKTOTAL, CKMB, CKMBINDEX, TROPONINI in the last 168 hours. BNP (last 3 results) No results for input(s): PROBNP in the last 8760 hours. HbA1C: No results for input(s): HGBA1C in the last 72 hours. CBG: No results for input(s): GLUCAP in the last 168 hours. Lipid Profile: No results for input(s): CHOL, HDL, LDLCALC, TRIG, CHOLHDL, LDLDIRECT in the last 72 hours. Thyroid Function Tests: No results for input(s): TSH, T4TOTAL, FREET4, T3FREE, THYROIDAB in the last 72 hours. Anemia Panel: No results for input(s): VITAMINB12, FOLATE, FERRITIN, TIBC, IRON, RETICCTPCT in the last 72 hours. Sepsis Labs:  Recent Labs Lab 05/10/16 1108  LATICACIDVEN 1.40    Recent Results (from the past 240 hour(s))  Surgical pcr screen     Status: Abnormal   Collection Time: 05/10/16  5:34 PM  Result Value Ref Range Status   MRSA, PCR NEGATIVE NEGATIVE Final   Staphylococcus aureus POSITIVE (A) NEGATIVE Final    Comment:        The Xpert SA Assay (FDA approved for NASAL specimens in patients over 87 years of age), is one component of a comprehensive surveillance program.  Test performance has been validated  by [facility] for patients greater than or equal to 76 year old. It is not intended to diagnose infection nor to guide or monitor treatment.          Radiology Studies: Dg Chest 2 View  Result Date: 05/10/2016 CLINICAL DATA:  Status post falling out of bed yesterday striking the left hip. The patient reports worsening shortness of breath and nonproductive cough. History of COPD. Current smoker. History of lung malignancy. EXAM: CHEST  2 VIEW COMPARISON:  CT scan of the chest of February 07, 2016 FINDINGS: The lungs are hyperinflated. There are chronic changes in the right suprahilar region not greatly changed from the previous study. There is no alveolar infiltrate. There is no pleural effusion. The heart and pulmonary vascularity are normal. There is mild shift of mediastinum toward the right. The observed bony thorax exhibits no acute abnormality. IMPRESSION: COPD. Chronic right upper lobe changes consistent with known malignancy. There has not been dramatic radiographic change since the CT scan  of February 07, 2016 but direct comparison of today's chest x-ray with the previous CT scan is of limited utility. Repeat chest CT scanning is recommended. COPD.  No CHF.  The observed bony thorax is unremarkable. Electronically Signed   By: David  Martinique M.D.   On: 05/10/2016 11:59   Ct Hip Left Wo Contrast  Result Date: 05/10/2016 CLINICAL DATA:  Proximal left femur fracture secondary to a fall yesterday. EXAM: CT OF THE LEFT HIP WITHOUT CONTRAST TECHNIQUE: Multidetector CT imaging of the left hip was performed according to the standard protocol. Multiplanar CT image reconstructions were also generated. COMPARISON:  Radiographs dated 05/10/2016 FINDINGS: Bones/Joint/Cartilage There is an impacted slightly comminuted intertrochanteric and low left femoral neck fracture with minimal angulation and minimal displacement. The visualized pelvic bones are intact. Joint spaces well-preserved. Adjacent soft  tissues are normal. IMPRESSION: Impacted slightly comminuted intertrochanteric and low femoral neck fracture on the left. Electronically Signed   By: Lorriane Shire M.D.   On: 05/10/2016 13:29   Dg Hip Unilat W Or Wo Pelvis 2-3 Views Left  Result Date: 05/10/2016 CLINICAL DATA:  Left hip pain after fall yesterday. EXAM: DG HIP (WITH OR WITHOUT PELVIS) 2-3V LEFT COMPARISON:  None. FINDINGS: There appears to be cortical disruption involving the greater trochanter concerning for mildly displaced fracture. The left femoral head and neck appear intact. No dislocation is noted. IMPRESSION: Probable mildly displaced fracture involving greater trochanter of proximal left femur. CT scan of the left hip is recommended for further evaluation. Electronically Signed   By: Marijo Conception, M.D.   On: 05/10/2016 12:01        Scheduled Meds: . aspirin EC  81 mg Oral Daily  . Chlorhexidine Gluconate Cloth  6 each Topical Daily  . gabapentin  300 mg Oral BID  . ipratropium-albuterol  3 mL Nebulization Q4H  . levofloxacin (LEVAQUIN) IV  500 mg Intravenous Q24H  . levothyroxine  25 mcg Oral QAC breakfast  . methylPREDNISolone (SOLU-MEDROL) injection  60 mg Intravenous Q6H  . mometasone-formoterol  2 puff Inhalation BID  . mupirocin ointment  1 application Nasal BID  . pantoprazole  40 mg Oral Daily  . simvastatin  20 mg Oral Daily  . spironolactone  12.5 mg Oral Daily   Continuous Infusions:   LOS: 1 day     Cordelia Poche [facility] 05/11/2016, 1:05 PM Pager: [phone]  If 7PM-7AM, please contact night-coverage [email or web address] Password [email or web address] 05/11/2016, 1:05 PM

## 2016-05-11 NOTE — Consult Note (Signed)
Name: Janice Daniels MRN: [id number] DOB: [birth year]    ADMISSION DATE:  05/10/2016 CONSULTATION DATE:  11/16 REFERRING MD :  Dr. Lonny Prude [facility]  CHIEF COMPLAINT:  Shortness of Breath  HISTORY OF PRESENT ILLNESS:  This is 70 y/o pt with PMH significant for COPD and Stage III [place] lung cancer. She presents 11/15 with persistent shortness of breath and left hip pain after a fall.  She states about two weeks prior she began have fevers and productive cough.  She talked with PCP over the phone, but nothing was prescribed.  Symptoms improved over the past week, however she is still having SOB.  11/15 she fell at home and presented to ED with hip pain, where she was found to have L hip fracture. She was admitted in this setting also with COPD exacerbation. She was treated with bronchodilators, steroids, and antibiotics with plans for OR on 11/16, however just prior to presenting to OR was found to have increased work of breathing. Surgery postponed and pulmonary consulted.  SIGNIFICANT EVENTS  11/15 admit   STUDIES:  PFT 04/2011 >  FEV1 1.57 (77%), ratio 47%, ++airtrapping, DLCO normal CT hip 11/15 > Impacted slightly comminuted intertrochanteric and low femoral neck fracture on the left.    PAST MEDICAL HISTORY :   has a past medical history of Acute on chronic systolic CHF (congestive heart failure) ([place]); Arthritis; Chronic bronchitis ([place]); COPD (chronic obstructive pulmonary disease) ([place]); Depression; Fall from bed (05/09/2016); GERD (gastroesophageal reflux disease); Heart murmur; History of chemotherapy; History of radiation therapy (06/05/11 to 07/18/11); History of radiation therapy (09/13/11 to 09/26/11); radiation therapy (10/03/12 - 10/09/12); Hypertension; Hypothyroid; Low back pain; Lung cancer ([place]) (05/09/2011); Lung cancer, upper lobe ([place]) (09/02/2012); Shingles; and Stroke ([place]).  has a past surgical history that includes Tubal ligation (1984); Tonsillectomy; Fiberoptic bronchoscopy  (Right, 05/03/2011); and Vaginal hysterectomy (1986). Prior to Admission medications   Medication Sig Start Date End Date Taking? Authorizing Provider  ADVAIR DISKUS 250-50 MCG/DOSE AEPB Inhale 1 puff into the lungs 2 (two) times daily. 05/11/15  Yes Historical Provider, MD  albuterol (VENTOLIN HFA) 108 (90 BASE) MCG/ACT inhaler Inhale 2 puffs into the lungs every 6 (six) hours as needed for wheezing or shortness of breath.    Yes Historical Provider, MD  aspirin EC 81 MG EC tablet Take 1 tablet (81 mg total) by mouth daily. 10/30/14  Yes Shanker Kristeen Mans, MD  gabapentin (NEURONTIN) 300 MG capsule Take 300 mg by mouth 2 (two) times daily. 01/27/16  Yes Historical Provider, MD  ipratropium (ATROVENT HFA) 17 MCG/ACT inhaler Inhale 2 puffs into the lungs every 6 (six) hours.   Yes Historical Provider, MD  ipratropium-albuterol (DUONEB) 0.5-2.5 (3) MG/3ML SOLN Take 3 mLs by nebulization every 6 (six) hours. 10/30/14  Yes Shanker Kristeen Mans, MD  Levetiracetam (KEPPRA XR) 750 MG TB24 Take 1 tablet (750 mg total) by mouth at bedtime. 11/10/15  Yes Marcial Pacas, MD  levothyroxine (SYNTHROID, LEVOTHROID) 25 MCG tablet Take 25 mcg by mouth daily. 12/23/15  Yes Historical Provider, MD  omeprazole (PRILOSEC) 20 MG capsule Take 20 mg by mouth daily. 12/31/15  Yes Historical Provider, MD  simvastatin (ZOCOR) 20 MG tablet Take 1 tablet (20 mg total) by mouth daily. PLEASE CONTACT OFFICE FOR ADDITIONAL REFILLS 02/02/16  Yes Minus Breeding, MD  spironolactone (ALDACTONE) 25 MG tablet Take 0.5 tablets (12.5 mg total) by mouth daily. PLEASE CONTACT OFFICE FOR ADDITIONAL REFILLS 02/21/16  Yes Minus Breeding, MD  Vitamin D, Ergocalciferol, (  DRISDOL) 50000 UNITS CAPS capsule Take 50,000 Units by mouth every 7 (seven) days. Monday   Yes Historical Provider, MD  levothyroxine (SYNTHROID, LEVOTHROID) 75 MCG tablet Take 75 mcg by mouth daily. 07/23/15   Historical Provider, MD   Allergies  Allergen Reactions  . Codeine Other (See Comments)     Elevated pulse    FAMILY HISTORY:  family history includes Cancer in her brother, father, and sister; Cervical cancer in her sister; Esophageal cancer in her brother and father; Heart disease in her mother; Hyperlipidemia in her mother; Hypertension in her mother; Hypothyroidism in her father; Other in her brother, brother, and brother. SOCIAL HISTORY:  reports that she has been smoking Cigarettes.  She has a 27.50 pack-year smoking history. She has never used smokeless tobacco. She reports that she does not drink alcohol or use drugs.  REVIEW OF SYSTEMS:   Constitutional: Negative for fever, chills, weight loss, malaise/fatigue and diaphoresis.  HENT: Negative for hearing loss, ear pain, nosebleeds, congestion, sore throat, neck pain, tinnitus and ear discharge.   Eyes: Negative for blurred vision, double vision, photophobia, pain, discharge and redness.  Respiratory: Negative for , hemoptysis,  and stridor. + sputum production, + shortness of breath, + expiratory wheezing + cough Cardiovascular: Negative for chest pain, palpitations, orthopnea, claudication, leg swelling and PND.  Gastrointestinal: Negative for heartburn, nausea, vomiting, abdominal pain, diarrhea, constipation, blood in stool and melena.  Genitourinary: Negative for dysuria, urgency, frequency, hematuria and flank pain.  Musculoskeletal: Negative for myalgias, back pain, joint pain and + falls.  Skin: Negative for itching and rash.  Neurological: Negative for dizziness, tingling, tremors, sensory change, speech change, focal weakness, seizures, loss of consciousness, weakness and headaches.  Endo/Heme/Allergies: Negative for environmental allergies and polydipsia. Does not bruise/bleed easily.  SUBJECTIVE:   VITAL SIGNS: Temp:  [97.6 F (36.4 C)-98 F (36.7 C)] 98 F (36.7 C) (11/16 0644) Pulse Rate:  [111-115] 113 (11/16 0644) Resp:  [22-24] 22 (11/15 2235) BP: (97-145)/(50-99) 145/99 (11/16 0644) SpO2:  [88  %-100 %] 90 % (11/16 1118)  PHYSICAL EXAMINATION: General:  Ill appearing elderly female with difficulty breathing Neuro:  A/O.  HEENT:  Normocephalic, No drainage from mouth, ears, nose Cardiovascular:  Normal rhythm no MRG Lungs:  Course expiratory wheeze Abdomen:  Soft non tender non distended  Musculoskeletal:  No acute deformities  Skin:  Pallor, grossly intact   Recent Labs Lab 05/10/16 1044 05/10/16 1452 05/11/16 0710  NA 129*  --  128*  K 4.0  --  4.2  CL 93*  --  94*  CO2 24  --  25  BUN 12  --  10  CREATININE 0.91 1.10* 0.92  GLUCOSE 110*  --  139*    Recent Labs Lab 05/10/16 1044 05/11/16 0710  HGB 13.5 12.2  HCT 38.3 36.1  WBC 9.8 9.9  PLT 254 260   Dg Chest 2 View  Result Date: 05/10/2016 CLINICAL DATA:  Status post falling out of bed yesterday striking the left hip. The patient reports worsening shortness of breath and nonproductive cough. History of COPD. Current smoker. History of lung malignancy. EXAM: CHEST  2 VIEW COMPARISON:  CT scan of the chest of February 07, 2016 FINDINGS: The lungs are hyperinflated. There are chronic changes in the right suprahilar region not greatly changed from the previous study. There is no alveolar infiltrate. There is no pleural effusion. The heart and pulmonary vascularity are normal. There is mild shift of mediastinum toward the right. The observed  bony thorax exhibits no acute abnormality. IMPRESSION: COPD. Chronic right upper lobe changes consistent with known malignancy. There has not been dramatic radiographic change since the CT scan of February 07, 2016 but direct comparison of today's chest x-ray with the previous CT scan is of limited utility. Repeat chest CT scanning is recommended. COPD.  No CHF.  The observed bony thorax is unremarkable. Electronically Signed   By: David  Martinique M.D.   On: 05/10/2016 11:59   Ct Hip Left Wo Contrast  Result Date: 05/10/2016 CLINICAL DATA:  Proximal left femur fracture secondary to a  fall yesterday. EXAM: CT OF THE LEFT HIP WITHOUT CONTRAST TECHNIQUE: Multidetector CT imaging of the left hip was performed according to the standard protocol. Multiplanar CT image reconstructions were also generated. COMPARISON:  Radiographs dated 05/10/2016 FINDINGS: Bones/Joint/Cartilage There is an impacted slightly comminuted intertrochanteric and low left femoral neck fracture with minimal angulation and minimal displacement. The visualized pelvic bones are intact. Joint spaces well-preserved. Adjacent soft tissues are normal. IMPRESSION: Impacted slightly comminuted intertrochanteric and low femoral neck fracture on the left. Electronically Signed   By: Lorriane Shire M.D.   On: 05/10/2016 13:29   Dg Hip Unilat W Or Wo Pelvis 2-3 Views Left  Result Date: 05/10/2016 CLINICAL DATA:  Left hip pain after fall yesterday. EXAM: DG HIP (WITH OR WITHOUT PELVIS) 2-3V LEFT COMPARISON:  None. FINDINGS: There appears to be cortical disruption involving the greater trochanter concerning for mildly displaced fracture. The left femoral head and neck appear intact. No dislocation is noted. IMPRESSION: Probable mildly displaced fracture involving greater trochanter of proximal left femur. CT scan of the left hip is recommended for further evaluation. Electronically Signed   By: Marijo Conception, M.D.   On: 05/10/2016 12:01    ASSESSMENT / PLAN:  COPD with acute exacerbation: known hx of COPD on home nebs no with SOB and coarse expiratory wheeze. Doubt PNA or Pulmonary edema based on non-acute CXR, Afebrile, WBC WNL. Based on physical exam and present work of breathing we would optimize her respiratory status before proceeding with any orthopedic surgery.   Plan: Supplemental O2 titrate SpO2 88-95% -Continue duo nebs and prn albuterol, will add scheduled budesonide and brovana for optimization.   -Continue Levaquin per primary  -Continue solumedrol 60 mg q 6/h; will wean in post op setting  -Pulmonary hygiene  with IS  Left hip fracture -Management per Ortho, hope to have respiratory status improved in 24-48 hours.     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 S/P chemo and radiotherapy in 2014 - Stable radiographic findings on CXR  Tobacco abuse - Smoking cessation counseling  Pulmonary and [place] Pager: [phone]  05/11/2016, 1:34 PM   Attending Note:  I have examined patient, reviewed labs, studies and notes. I have discussed the case with Jaclynn Guarneri, and I agree with the data and plans as amended above.   70 yo woman with COPD, continues to smoke, admitted with a fall and L hip fx. She is being evaluated for Ortho repair, was planning to go today. On my eval she is moderately dyspneic, has wheezes throughout the resp cycle. She is hemodynamically stable. She appears to be flaring her COPD, still has bronchospasm despite IV steroids, scheduled BD's. I would recommend deferring surgery until her airflows improve, adding mucinex for secretion clearance. We will follow with you.   Baltazar Apo, MD, PhD 05/11/2016, 4:15 PM [facility]  [phone] or if no answer [phone]

## 2016-05-11 NOTE — Consult Note (Signed)
     [facility] CM Primary Care Navigator  05/11/2016  Janice Daniels 1945-12-05 [id number]    Met with patient at the bedside to identify possible discharge needs.  Patient had worsening left hip pain after a fall ("rolled out of bed while asleep") and persistent worsening shortness of breath (COPD exacerbation) that had led to this admission.  Per MD note, patient has lung cancer of right upper lobe status post chemotherapy and radiation, is not on home oxygen but still smokes.  Patient reports that Dr. Leanna Battles with [facility] as herprimary care provider.   [facility] with n[facility]blem. Patient reportshusband Janice Daniels) helps in managing her medications at home.    Patient lives with spouse and she is fairly independent with care. Husband provides transportation to herdoctors'appointments.  Patient's husband is the primary caregiver when she returns home. Niece Janice Daniels) who lives close by will be able to assist with care needs when needed as stated. Patient mentioned that plan for discharge is possible skilled nursing facility (SNF) for short term rehabilitation.   Patient and niece voiced understanding to call primary care provider's office when she gets back home, for a post discharge follow-up appointment within a week or sooner if needed. Patient letter provided as a reminder.   Patient denies other needs or concerns and declined care coordination services at this time. [facility] care management contact information provided to niece/ patient if future needs arise.   For additional questions please contact:  Edwena Felty A. Snow Peoples, BSN, RN-BC [facility] PRIMARY CARE Navigator Cell: [phone]

## 2016-05-11 NOTE — Care Management Note (Signed)
Case Management Note  Patient Details  Name: Janice Daniels MRN: [id number] Date of Birth: 21-Jul-1945  Subjective/Objective:                    Action/Plan: Consult for home health needs , await post op PT/OT evals, will need MD orders and face to face  Expected Discharge Date:                  Expected Discharge Plan:     In-House Referral:     Discharge planning Services  CM Consult  Post Acute Care Choice:  Home Health Choice offered to:     DME Arranged:    DME Agency:     HH Arranged:    [place] Agency:     Status of Service:  In process, will continue to follow  If discussed at Long Length of Stay Meetings, dates discussed:    Additional Comments:  Marilu Favre, RN 05/11/2016, 10:51 AM

## 2016-05-11 NOTE — Progress Notes (Signed)
Pt c/o 9/10 hip pain but is declining morphine. Ordered prn med too early to administer,MD paged

## 2016-05-12 DIAGNOSIS — R0603 Acute respiratory distress: Secondary | ICD-10-CM | POA: Diagnosis not present

## 2016-05-12 DIAGNOSIS — J9621 Acute and chronic respiratory failure with hypoxia: Secondary | ICD-10-CM | POA: Diagnosis not present

## 2016-05-12 DIAGNOSIS — I5022 Chronic systolic (congestive) heart failure: Secondary | ICD-10-CM | POA: Diagnosis not present

## 2016-05-12 DIAGNOSIS — J441 Chronic obstructive pulmonary disease with (acute) exacerbation: Secondary | ICD-10-CM | POA: Diagnosis not present

## 2016-05-12 DIAGNOSIS — S72002A Fracture of unspecified part of neck of left femur, initial encounter for closed fracture: Secondary | ICD-10-CM | POA: Diagnosis not present

## 2016-05-12 DIAGNOSIS — R0602 Shortness of breath: Secondary | ICD-10-CM

## 2016-05-12 LAB — BASIC METABOLIC PANEL
Anion gap: 10 (ref 5–15)
BUN: 13 mg/dL (ref 6–20)
CHLORIDE: 90 mmol/L — AB (ref 101–111)
CO2: 25 mmol/L (ref 22–32)
CREATININE: 1.17 mg/dL — AB (ref 0.44–1.00)
Calcium: 9.1 mg/dL (ref 8.9–10.3)
GFR, EST AFRICAN AMERICAN: 53 mL/min — AB (ref >60)
GFR, EST NON AFRICAN AMERICAN: 46 mL/min — AB (ref >60)
Glucose, Bld: 156 mg/dL — ABNORMAL HIGH (ref 65–99)
POTASSIUM: 4.5 mmol/L (ref 3.5–5.1)
SODIUM: 125 mmol/L — AB (ref 135–145)

## 2016-05-12 MED ORDER — PROPOFOL 10 MG/ML IV BOLUS
INTRAVENOUS | Status: AC
  Filled 2016-05-12: qty 20

## 2016-05-12 MED ORDER — PHENYLEPHRINE 40 MCG/ML (10ML) SYRINGE FOR IV PUSH (FOR BLOOD PRESSURE SUPPORT)
PREFILLED_SYRINGE | INTRAVENOUS | Status: AC
  Filled 2016-05-12: qty 20

## 2016-05-12 MED ORDER — LEVOFLOXACIN 500 MG PO TABS
500.0000 mg | ORAL_TABLET | ORAL | Status: DC
  Administered 2016-05-12 – 2016-05-16 (×5): 500 mg via ORAL
  Filled 2016-05-12 (×6): qty 1

## 2016-05-12 MED ORDER — METHYLPREDNISOLONE SODIUM SUCC 125 MG IJ SOLR
60.0000 mg | INTRAMUSCULAR | Status: DC
  Administered 2016-05-12 (×4): 60 mg via INTRAVENOUS
  Administered 2016-05-12: 15:00:00 via INTRAVENOUS
  Administered 2016-05-12 – 2016-05-14 (×20): 60 mg via INTRAVENOUS
  Filled 2016-05-12 (×24): qty 2

## 2016-05-12 NOTE — Progress Notes (Signed)
PROGRESS NOTE    Janice Daniels  ZOX:[id number] DOB: July 07, 1945 DOA: 05/10/2016 PCP: Donnajean Lopes, MD   Brief Narrative: Janice Daniels is a 70 y.o. 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 She presented with a COPD exacerbation and left hip fracture.   Assessment & Plan:   Principal Problem:   Closed left hip fracture (HCC) Active Problems:   Essential hypertension   COPD (chronic obstructive pulmonary disease) with emphysema (HCC)   GERD   Lung cancer (HCC)   COPD exacerbation (HCC)   Hypothyroidism   Other specified hypothyroidism   Tobacco abuse   Chronic systolic heart failure (HCC)   Hyponatremia   Acute respiratory distress   Closed hip fracture, left -orthopedic recommendations: surgery (postponed secondary to respiratory status) -analgesics  COPD exacerbation Acute respiratory failure with hypoxia Slightly improved but still significant exacerbation. Still requiring oxygen -continue nebs -continue budesonide/brovana -continue steroids -continue levaquin -O2 via [place]; wean as tolerated -pulmonology consulted for surgery clearance  Chronic systolic heart failure Appears euvolemic. Last EF of 35-40%, mild LVH, diffuse hypokinesis. On spironolactone. -in/out -daily weights  Hyponatremia Sodium slightly decreased today. Spironolactone held. Serum osmolality lower end of normal. -continue IV fluids  Hypertension Not on medication therapy  Hypothyroidism -continue synthroid  History of partial seizure Keppra held on admission -continue gabapentin  Tobacco abuse Counseling given  History of small cell lung cancer Diagnosed October 2012. Chart review indicates status post palliative radiotherapy, systemic chemotherapy prophylactic cranial irradiation,  curative stereotactic radiotherapy. Office visit note dated August 2017 C[place] recent CT scan showed no evidence for disease progression septic for evolving radiation changes and masslike consolidation in the right upper lobe and posterior right upper lobe. Noted also indicates this is been evaluated with PET scan in the past and showed no hypermetabolic activity -Patient will follow-up in 6 months with oncology    DVT prophylaxis: SCDs Code Status: Full code Family Communication: Husband and daughter at bedside Disposition Plan: Pending improvement in respiratory status   Consultants:   Orthopedic surgery  Pulmonology  Procedures:   None  Antimicrobials:  Azithromycin (11/15)  Levaquin (11/15>>    Subjective: Dyspnea still present. No chest pain. Significant hip pain controlled with analgesics.  Objective: Vitals:   05/12/16 0808 05/12/16 1020 05/12/16 1346 05/12/16 1407  BP:  (!) 144/77 135/79   Pulse:  (!) 110 (!) 105   Resp:  20 19   Temp:  97.5 F (36.4 C) 98.2 F (36.8 C)   TempSrc:  Oral Oral   SpO2: 95% (!) 89% 98% 97%  Weight:      Height:        Intake/Output Summary (Last 24 hours) at 05/12/16 1601 Last data filed at 05/12/16 1458  Gross per 24 hour  Intake              940 ml  Output              500 ml  Net              440 ml   Filed Weights   05/10/16 1023  Weight: 57.2 kg (126 lb)    Examination:  General exam: Appears calm and comfortable Respiratory system: diffuse wheezing and rhonchi with increased work of breathing, improved slightly Cardiovascular system: S1 & S2 heard, RRR. No murmurs, rubs, gallops or clicks.  Gastrointestinal system: Abdomen is nondistended, soft and nontender. Normal bowel sounds heard. Central nervous system: Alert and oriented. No focal neurological deficits. Extremities: No edema. No calf tenderness Skin: No cyanosis. No rashes Psychiatry: Judgement and insight appear normal. Mood & affect appropriate.      Data Reviewed: I have personally reviewed following labs and imaging studies  CBC:  Recent Labs Lab 05/10/16 1044 05/11/16 0710  WBC 9.8 9.9  NEUTROABS 8.7*  --   HGB 13.5 12.2  HCT 38.3 36.1  MCV 90.8 91.6  PLT 254 626   Basic Metabolic Panel:  Recent Labs Lab 05/10/16 1044 05/10/16 1452 05/11/16 0710 05/12/16 1028  NA 129*  --  128* 125*  K 4.0  --  4.2 4.5  CL 93*  --  94* 90*  CO2 24  --  25 25  GLUCOSE 110*  --  139* 156*  BUN 12  --  10 13  CREATININE 0.91 1.10* 0.92 1.17*  CALCIUM 9.0  --  9.1 9.1   GFR: Estimated Creatinine Clearance: 38.1 mL/min (by C-G formula based on SCr of 1.17 mg/dL (H)). Liver Function Tests:  Recent Labs Lab 05/10/16 1044  AST 32  ALT 21  ALKPHOS 55  BILITOT 0.9  PROT 6.9  ALBUMIN 3.3*   No results for input(s): LIPASE, AMYLASE in the last 168 hours. No results for input(s): AMMONIA in the last 168 hours. Coagulation Profile:  Recent Labs Lab 05/10/16 1452  INR 1.00   Cardiac Enzymes: No results for input(s): CKTOTAL, CKMB, CKMBINDEX, TROPONINI in the last 168 hours. BNP (last 3 results) No results for input(s): PROBNP in the last 8760 hours. HbA1C: No results for input(s): HGBA1C in the last 72 hours. CBG: No results for input(s): GLUCAP in the last 168 hours. Lipid Profile: No results for input(s): CHOL, HDL, LDLCALC, TRIG, CHOLHDL, LDLDIRECT in the last 72 hours. Thyroid Function Tests: No results for input(s): TSH, T4TOTAL, FREET4, T3FREE, THYROIDAB in the last 72 hours. Anemia Panel: No results for input(s): VITAMINB12, FOLATE, FERRITIN, TIBC, IRON, RETICCTPCT in the last 72 hours. Sepsis Labs:  Recent Labs Lab 05/10/16 1108  LATICACIDVEN 1.40    Recent Results (from the past 240 hour(s))  Surgical pcr screen     Status: Abnormal   Collection Time: 05/10/16  5:34 PM  Result Value Ref Range Status   MRSA, PCR NEGATIVE NEGATIVE Final   Staphylococcus aureus POSITIVE (A) NEGATIVE Final     Comment:        The Xpert SA Assay (FDA approved for NASAL specimens in patients over 63 years of age), is one component of a comprehensive surveillance program.  Test performance has been validated by [facility] for patients greater than or equal to 71 year old. It is not intended to diagnose infection nor to guide or monitor treatment.          Radiology Studies: No results found.      Scheduled Meds: . arformoterol  15 mcg Nebulization BID  . aspirin EC  81 mg Oral Daily  . budesonide (PULMICORT) nebulizer solution  0.5 mg Nebulization BID  . Chlorhexidine Gluconate Cloth  6 each Topical Daily  . gabapentin  300 mg Oral BID  . guaiFENesin  1,200 mg Oral BID  . ipratropium-albuterol  3 mL Nebulization Q6H  . levofloxacin  500 mg Oral Q24H  . levothyroxine  25 mcg Oral QAC breakfast  . methylPREDNISolone (SOLU-MEDROL) injection  60 mg Intravenous Q2H  . mupirocin ointment  1  application Nasal BID  . pantoprazole  40 mg Oral Daily  . simvastatin  20 mg Oral Daily   Continuous Infusions:   LOS: 2 days     Cordelia Poche [facility] 05/12/2016, 4:01 PM Pager: [phone]  If 7PM-7AM, please contact night-coverage [email or web address] Password [email or web address] 05/12/2016, 4:01 PM

## 2016-05-12 NOTE — Progress Notes (Signed)
Surgery postponed until respiratory status improves.  Please call me when pulmonology anticipates that patient will be ready for surgery.  Azucena Cecil, MD [place] 10:30 AM

## 2016-05-12 NOTE — Progress Notes (Signed)
Surgery tentatively scheduled for Sunday morning.  Please call me directly if she is not clear.  Azucena Cecil, MD [place] 7:08 PM

## 2016-05-12 NOTE — Progress Notes (Signed)
Orthopedic Tech Progress Note Patient Details:  Janice Daniels 11/30/1945 [id number]  Ortho Devices Ortho Device/Splint Location: trapeze bar patient helper Ortho Device/Splint Interventions: Application   Hildred Priest 05/12/2016, 12:57 PM

## 2016-05-12 NOTE — Progress Notes (Signed)
Name: Janice Daniels MRN: [id number] DOB: [birth year]    ADMISSION DATE:  05/10/2016 CONSULTATION DATE:  11/16 REFERRING MD :  Dr. Lonny Prude TR[facility]  CHIEF COMPLAINT:  Shortness of Breath  HISTORY OF PRESENT ILLNESS:  This is 70 y/o pt with PMH significant for COPD and Stage III [place] lung cancer. She presents 11/15 with persistent shortness of breath and left hip pain after a fall.  She states about two weeks prior she began have fevers and productive cough.  She talked with PCP over the phone, but nothing was prescribed.  Symptoms improved over the past week, however she is still having SOB.  11/15 she fell at home and presented to ED with hip pain, where she was found to have L hip fracture. She was admitted in this setting also with COPD exacerbation. She was treated with bronchodilators, steroids, and antibiotics with plans for OR on 11/16, however just prior to presenting to OR was found to have increased work of breathing. Surgery postponed and pulmonary consulted.   SUBJECTIVE: Still SOB, possibly a bit better than 11/16  VITAL SIGNS: Temp:  [97.4 F (36.3 C)-98.1 F (36.7 C)] 97.5 F (36.4 C) (11/17 1020) Pulse Rate:  [104-110] 110 (11/17 1020) Resp:  [20-24] 20 (11/17 1020) BP: (133-144)/(77-96) 144/77 (11/17 1020) SpO2:  [89 %-95 %] 89 % (11/17 1020) FiO2 (%):  [32 %] 32 % (11/17 0808)  PHYSICAL EXAMINATION: General:  Ill appearing elderly female with difficulty breathing Neuro:  A/O.  HEENT:  Normocephalic, No drainage from mouth, ears, nose Cardiovascular:  Normal rhythm no MRG Lungs:  Course expiratory wheeze, congestion, sob at rest Abdomen:  Soft non tender non distended  Musculoskeletal:  No acute deformities  Skin:  Pallor, grossly intact   Recent Labs Lab 05/10/16 1044 05/10/16 1452 05/11/16 0710  NA 129*  --  128*  K 4.0  --  4.2  CL 93*  --  94*  CO2 24  --  25  BUN 12  --  10  CREATININE 0.91 1.10* 0.92  GLUCOSE 110*  --  139*    Recent Labs Lab  05/10/16 1044 05/11/16 0710  HGB 13.5 12.2  HCT 38.3 36.1  WBC 9.8 9.9  PLT 254 260   Dg Chest 2 View  Result Date: 05/10/2016 CLINICAL DATA:  Status post falling out of bed yesterday striking the left hip. The patient reports worsening shortness of breath and nonproductive cough. History of COPD. Current smoker. History of lung malignancy. EXAM: CHEST  2 VIEW COMPARISON:  CT scan of the chest of February 07, 2016 FINDINGS: The lungs are hyperinflated. There are chronic changes in the right suprahilar region not greatly changed from the previous study. There is no alveolar infiltrate. There is no pleural effusion. The heart and pulmonary vascularity are normal. There is mild shift of mediastinum toward the right. The observed bony thorax exhibits no acute abnormality. IMPRESSION: COPD. Chronic right upper lobe changes consistent with known malignancy. There has not been dramatic radiographic change since the CT scan of February 07, 2016 but direct comparison of today's chest x-ray with the previous CT scan is of limited utility. Repeat chest CT scanning is recommended. COPD.  No CHF.  The observed bony thorax is unremarkable. Electronically Signed   By: David  Martinique M.D.   On: 05/10/2016 11:59   Ct Hip Left Wo Contrast  Result Date: 05/10/2016 CLINICAL DATA:  Proximal left femur fracture secondary to a fall yesterday. EXAM: CT OF THE LEFT  HIP WITHOUT CONTRAST TECHNIQUE: Multidetector CT imaging of the left hip was performed according to the standard protocol. Multiplanar CT image reconstructions were also generated. COMPARISON:  Radiographs dated 05/10/2016 FINDINGS: Bones/Joint/Cartilage There is an impacted slightly comminuted intertrochanteric and low left femoral neck fracture with minimal angulation and minimal displacement. The visualized pelvic bones are intact. Joint spaces well-preserved. Adjacent soft tissues are normal. IMPRESSION: Impacted slightly comminuted intertrochanteric and low  femoral neck fracture on the left. Electronically Signed   By: Lorriane Shire M.D.   On: 05/10/2016 13:29   Dg Hip Unilat W Or Wo Pelvis 2-3 Views Left  Result Date: 05/10/2016 CLINICAL DATA:  Left hip pain after fall yesterday. EXAM: DG HIP (WITH OR WITHOUT PELVIS) 2-3V LEFT COMPARISON:  None. FINDINGS: There appears to be cortical disruption involving the greater trochanter concerning for mildly displaced fracture. The left femoral head and neck appear intact. No dislocation is noted. IMPRESSION: Probable mildly displaced fracture involving greater trochanter of proximal left femur. CT scan of the left hip is recommended for further evaluation. Electronically Signed   By: Marijo Conception, M.D.   On: 05/10/2016 12:01    ASSESSMENT / PLAN:  COPD with acute exacerbation: known hx of COPD on home nebs no with SOB and coarse expiratory wheeze. Doubt PNA or Pulmonary edema based on non-acute CXR, Afebrile, WBC WNL. Based on physical exam and present work of breathing we would optimize her respiratory status before proceeding with any orthopedic surgery.   Plan: Supplemental O2 titrate SpO2 88-95% -Continue duo nebs and prn albuterol, will add scheduled budesonide and brovana for optimization.   -Continue Levaquin per primary  -Continue solumedrol 60 mg q 6/h; will wean in post op setting  -Pulmonary hygiene with IS and flutter valve -She will need another 48 hours prior to any surgery  Left hip fracture -Management per Ortho, hope to have respiratory status improved in 24-48 hours.     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 S/P chemo and radiotherapy in 2014 - Stable radiographic findings on CXR  Tobacco abuse - Smoking cessation counseling  Richardson Landry Minor ACNP Maryanna Shape PCCM Pager [phone] till 3 pm If no answer page [phone] 05/12/2016, 10:37 AM   Attending Note:  I have examined patient, reviewed labs, studies and notes. I have discussed the case with S Minor, and I  agree with the data and plans as amended above. 70 yo woman with severe COPD, hx NSCLCA, followed by Dr Lake Bells in our office. She has a L hip fx from mechanical fall, will need surgical repair. Is being treated for am AE-COPD. On my eval she is a bit less SOB, is able to speak full sentences. She has wheezes throughout expiration, softer than 11/16 exam. Suspect she will need to be treated through the weekend before she would be ready for hip surgery. Continue current management and assess improvement every day. We will check her on Monday. Call if we can assist through the weekend.   Baltazar Apo, MD, PhD 05/12/2016, 2:24 PM [facility] [phone] or if no answer [phone]

## 2016-05-13 DIAGNOSIS — S72002A Fracture of unspecified part of neck of left femur, initial encounter for closed fracture: Secondary | ICD-10-CM | POA: Diagnosis not present

## 2016-05-13 DIAGNOSIS — R0602 Shortness of breath: Secondary | ICD-10-CM | POA: Diagnosis not present

## 2016-05-13 DIAGNOSIS — I5022 Chronic systolic (congestive) heart failure: Secondary | ICD-10-CM | POA: Diagnosis not present

## 2016-05-13 DIAGNOSIS — J9621 Acute and chronic respiratory failure with hypoxia: Secondary | ICD-10-CM | POA: Diagnosis not present

## 2016-05-13 DIAGNOSIS — R0603 Acute respiratory distress: Secondary | ICD-10-CM | POA: Diagnosis not present

## 2016-05-13 DIAGNOSIS — J441 Chronic obstructive pulmonary disease with (acute) exacerbation: Secondary | ICD-10-CM | POA: Diagnosis not present

## 2016-05-13 LAB — BASIC METABOLIC PANEL
ANION GAP: 7 (ref 5–15)
BUN: 11 mg/dL (ref 6–20)
CHLORIDE: 95 mmol/L — AB (ref 101–111)
CO2: 30 mmol/L (ref 22–32)
Calcium: 9 mg/dL (ref 8.9–10.3)
Creatinine, Ser: 0.94 mg/dL (ref 0.44–1.00)
GFR calc Af Amer: >60 mL/min (ref >60)
GFR calc non Af Amer: 60 mL/min — ABNORMAL LOW (ref >60)
GLUCOSE: 139 mg/dL — AB (ref 65–99)
POTASSIUM: 4.2 mmol/L (ref 3.5–5.1)
Sodium: 132 mmol/L — ABNORMAL LOW (ref 135–145)

## 2016-05-13 MED ORDER — SODIUM CHLORIDE 0.9 % IV SOLN
INTRAVENOUS | Status: DC
  Administered 2016-05-13 – 2016-05-14 (×2): via INTRAVENOUS

## 2016-05-13 MED ORDER — POLYETHYLENE GLYCOL 3350 17 G PO PACK
17.0000 g | PACK | Freq: Every day | ORAL | Status: DC
Start: 2016-05-14 — End: 2016-05-14
  Administered 2016-05-14: 17 g via ORAL
  Filled 2016-05-13: qty 1

## 2016-05-13 MED ORDER — CEFAZOLIN SODIUM-DEXTROSE 2-4 GM/100ML-% IV SOLN
2.0000 g | INTRAVENOUS | Status: AC
  Filled 2016-05-13 (×2): qty 100

## 2016-05-13 NOTE — Progress Notes (Signed)
PROGRESS NOTE    Janice Daniels  WJX:[id number] DOB: [birth year] DOA: 05/10/2016 PCP: Donnajean Lopes, MD   Brief Narrative: Janice Daniels is a 70 y.o. 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 She presented with a COPD exacerbation and left hip fracture.   Assessment & Plan:   Principal Problem:   Closed left hip fracture (HCC) Active Problems:   Essential hypertension   COPD (chronic obstructive pulmonary disease) with emphysema (HCC)   GERD   Lung cancer (HCC)   COPD exacerbation (HCC)   Hypothyroidism   Other specified hypothyroidism   Tobacco abuse   Chronic systolic heart failure (HCC)   Hyponatremia   Acute respiratory distress   Closed hip fracture, left -orthopedic recommendations: surgery (postponed secondary to respiratory status) -analgesics  COPD exacerbation Acute respiratory failure with hypoxia Slightly improved but still significant exacerbation. Still requiring oxygen -pulmonology recommendations -continue nebs -continue budesonide/brovana -continue steroids -continue levaquin -O2 via [place]; wean as tolerated  Chronic systolic heart failure Appears euvolemic. Last EF of 35-40%, mild LVH, diffuse hypokinesis. On spironolactone. -in/out -daily weights  Hyponatremia Improved -continue IV fluids  Hypertension Not on medication therapy  Hypothyroidism -continue synthroid  History of partial seizure Keppra held on admission -continue gabapentin  Tobacco abuse Counseling given  History of small cell lung cancer Diagnosed October 2012. Chart review indicates status post palliative radiotherapy, systemic chemotherapy prophylactic cranial irradiation, curative stereotactic radiotherapy. Office visit note dated August 2017 [place] recent CT scan showed no  evidence for disease progression septic for evolving radiation changes and masslike consolidation in the right upper lobe and posterior right upper lobe. Noted also indicates this is been evaluated with PET scan in the past and showed no hypermetabolic activity -Patient will follow-up in 6 months with oncology   DVT prophylaxis: SCDs Code Status: Full code Family Communication: None at bedside Disposition Plan: Pending improvement in respiratory status   Consultants:   Orthopedic surgery  Pulmonology  Procedures:   None  Antimicrobials:  Azithromycin (11/15)  Levaquin (11/15>>    Subjective: Continues to feel better. No chest pain. Hip pain when moving and with big coughs  Objective: Vitals:   05/13/16 0835 05/13/16 1022 05/13/16 1238 05/13/16 1357  BP:  (!) 139/93 (!) 144/86   Pulse:  (!) 114 (!) 107   Resp:  20 20   Temp:  98.2 F (36.8 C) 98.5 F (36.9 C)   TempSrc:  Oral Oral   SpO2: 95% 92% 95% 93%  Weight:      Height:        Intake/Output Summary (Last 24 hours) at 05/13/16 1504 Last data filed at 05/13/16 1413  Gross per 24 hour  Intake                0 ml  Output             3150 ml  Net            -3150 ml   Filed Weights   05/10/16 1023  Weight: 57.2 kg (126 lb)    Examination:  General exam: Appears calm and comfortable Respiratory system: diffuse wheezing and rhonchi with increased work of breathing Cardiovascular system: S1 & S2 heard, RRR. No murmurs, rubs, gallops or clicks. Gastrointestinal system: Abdomen is nondistended, soft and nontender. Normal bowel sounds heard. Central nervous system: Alert and  oriented. No focal neurological deficits. Extremities: No edema. No calf tenderness Skin: No cyanosis. No rashes Psychiatry: Judgement and insight appear normal. Mood & affect appropriate.     Data Reviewed: I have personally reviewed following labs and imaging studies  CBC:  Recent Labs Lab 05/10/16 1044 05/11/16 0710  WBC  9.8 9.9  NEUTROABS 8.7*  --   HGB 13.5 12.2  HCT 38.3 36.1  MCV 90.8 91.6  PLT 254 332   Basic Metabolic Panel:  Recent Labs Lab 05/10/16 1044 05/10/16 1452 05/11/16 0710 05/12/16 1028 05/13/16 0938  NA 129*  --  128* 125* 132*  K 4.0  --  4.2 4.5 4.2  CL 93*  --  94* 90* 95*  CO2 24  --  '25 25 30  '$ GLUCOSE 110*  --  139* 156* 139*  BUN 12  --  '10 13 11  '$ CREATININE 0.91 1.10* 0.92 1.17* 0.94  CALCIUM 9.0  --  9.1 9.1 9.0   GFR: Estimated Creatinine Clearance: 47.4 mL/min (by C-G formula based on SCr of 0.94 mg/dL). Liver Function Tests:  Recent Labs Lab 05/10/16 1044  AST 32  ALT 21  ALKPHOS 55  BILITOT 0.9  PROT 6.9  ALBUMIN 3.3*   No results for input(s): LIPASE, AMYLASE in the last 168 hours. No results for input(s): AMMONIA in the last 168 hours. Coagulation Profile:  Recent Labs Lab 05/10/16 1452  INR 1.00   Cardiac Enzymes: No results for input(s): CKTOTAL, CKMB, CKMBINDEX, TROPONINI in the last 168 hours. BNP (last 3 results) No results for input(s): PROBNP in the last 8760 hours. HbA1C: No results for input(s): HGBA1C in the last 72 hours. CBG: No results for input(s): GLUCAP in the last 168 hours. Lipid Profile: No results for input(s): CHOL, HDL, LDLCALC, TRIG, CHOLHDL, LDLDIRECT in the last 72 hours. Thyroid Function Tests: No results for input(s): TSH, T4TOTAL, FREET4, T3FREE, THYROIDAB in the last 72 hours. Anemia Panel: No results for input(s): VITAMINB12, FOLATE, FERRITIN, TIBC, IRON, RETICCTPCT in the last 72 hours. Sepsis Labs:  Recent Labs Lab 05/10/16 1108  LATICACIDVEN 1.40    Recent Results (from the past 240 hour(s))  Surgical pcr screen     Status: Abnormal   Collection Time: 05/10/16  5:34 PM  Result Value Ref Range Status   MRSA, PCR NEGATIVE NEGATIVE Final   Staphylococcus aureus POSITIVE (A) NEGATIVE Final    Comment:        The Xpert SA Assay (FDA approved for NASAL specimens in patients over 21 years of  age), is one component of a comprehensive surveillance program.  Test performance has been validated by [facility] for patients greater than or equal to 37 year old. It is not intended to diagnose infection nor to guide or monitor treatment.          Radiology Studies: No results found.      Scheduled Meds: . arformoterol  15 mcg Nebulization BID  . aspirin EC  81 mg Oral Daily  . budesonide (PULMICORT) nebulizer solution  0.5 mg Nebulization BID  .  ceFAZolin (ANCEF) IV  2 g Intravenous To SSTC  . Chlorhexidine Gluconate Cloth  6 each Topical Daily  . gabapentin  300 mg Oral BID  . guaiFENesin  1,200 mg Oral BID  . ipratropium-albuterol  3 mL Nebulization Q6H  . levofloxacin  500 mg Oral Q24H  . levothyroxine  25 mcg Oral QAC breakfast  . methylPREDNISolone (SOLU-MEDROL) injection  60 mg Intravenous Q2H  .  mupirocin ointment  1 application Nasal BID  . pantoprazole  40 mg Oral Daily  . simvastatin  20 mg Oral Daily   Continuous Infusions: . sodium chloride 75 mL/hr at 05/13/16 1053     LOS: 3 days     Cordelia Poche [facility] 05/13/2016, 3:04 PM Pager: [phone]  If 7PM-7AM, please contact night-coverage [email or web address] Password [email or web address] 05/13/2016, 3:04 PM

## 2016-05-13 NOTE — Progress Notes (Addendum)
Pt is on mod high back rest, on O2 at 2.5 L per nasal cannula, audible coarse expiratory wheezing noted. Cont pulse ox on, 96%. Pt is using flutter valve. Talked to pt about the importance of repositioning to prevent pressure ulcer. Sacral foam dressing applied, no skin breakdown noted. Turned pt to right side and back every 2 hrs, kept heels off the bed w/ pillow. Left hip pain esp noted w/ movement. Pain meds given as needed. 1700 No clearance for surgery from Pulmonary. Left a message to Dr Phoebe Sharps voice mail [phone]).

## 2016-05-14 ENCOUNTER — Inpatient Hospital Stay (HOSPITAL_COMMUNITY): Payer: PPO

## 2016-05-14 ENCOUNTER — Encounter (HOSPITAL_COMMUNITY)
Admission: EM | Disposition: A | Discharge status: Skilled Nursing Facility | Payer: Self-pay | Source: Home / Self Care | Attending: Family Medicine

## 2016-05-14 DIAGNOSIS — S72002A Fracture of unspecified part of neck of left femur, initial encounter for closed fracture: Secondary | ICD-10-CM | POA: Diagnosis not present

## 2016-05-14 DIAGNOSIS — R0603 Acute respiratory distress: Secondary | ICD-10-CM | POA: Diagnosis not present

## 2016-05-14 DIAGNOSIS — J9621 Acute and chronic respiratory failure with hypoxia: Secondary | ICD-10-CM | POA: Diagnosis not present

## 2016-05-14 DIAGNOSIS — I5022 Chronic systolic (congestive) heart failure: Secondary | ICD-10-CM | POA: Diagnosis not present

## 2016-05-14 DIAGNOSIS — R0602 Shortness of breath: Secondary | ICD-10-CM | POA: Diagnosis not present

## 2016-05-14 DIAGNOSIS — R042 Hemoptysis: Secondary | ICD-10-CM | POA: Diagnosis not present

## 2016-05-14 DIAGNOSIS — J441 Chronic obstructive pulmonary disease with (acute) exacerbation: Secondary | ICD-10-CM | POA: Diagnosis not present

## 2016-05-14 SURGERY — FIXATION, FRACTURE, INTERTROCHANTERIC, WITH INTRAMEDULLARY ROD
Anesthesia: General | Laterality: Left

## 2016-05-14 MED ORDER — METHYLPREDNISOLONE SODIUM SUCC 125 MG IJ SOLR
60.0000 mg | Freq: Two times a day (BID) | INTRAMUSCULAR | Status: DC
  Administered 2016-05-15 – 2016-05-17 (×5): 60 mg via INTRAVENOUS
  Filled 2016-05-14 (×5): qty 2

## 2016-05-14 MED ORDER — BISACODYL 10 MG RE SUPP
10.0000 mg | Freq: Once | RECTAL | Status: AC
  Administered 2016-05-14: 10 mg via RECTAL
  Filled 2016-05-14 (×2): qty 1

## 2016-05-14 MED ORDER — SENNOSIDES-DOCUSATE SODIUM 8.6-50 MG PO TABS
1.0000 | ORAL_TABLET | Freq: Two times a day (BID) | ORAL | Status: DC
  Administered 2016-05-14 – 2016-05-24 (×18): 1 via ORAL
  Filled 2016-05-14 (×20): qty 1

## 2016-05-14 MED ORDER — WHITE PETROLATUM GEL
Status: AC
  Filled 2016-05-14: qty 1

## 2016-05-14 MED ORDER — IPRATROPIUM-ALBUTEROL 0.5-2.5 (3) MG/3ML IN SOLN
3.0000 mL | Freq: Two times a day (BID) | RESPIRATORY_TRACT | Status: DC
  Administered 2016-05-14 – 2016-05-18 (×8): 3 mL via RESPIRATORY_TRACT
  Filled 2016-05-14 (×8): qty 3

## 2016-05-14 MED ORDER — POLYETHYLENE GLYCOL 3350 17 G PO PACK
17.0000 g | PACK | Freq: Two times a day (BID) | ORAL | Status: DC
  Administered 2016-05-14 – 2016-05-25 (×16): 17 g via ORAL
  Filled 2016-05-14 (×20): qty 1

## 2016-05-14 NOTE — Progress Notes (Signed)
PROGRESS NOTE    Janice Daniels  LOV:[id number] DOB: 01-Nov-1945 DOA: 05/10/2016 PCP: Donnajean Lopes, MD   Brief Narrative: Janice Daniels is a 70 y.o. 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 She presented with a COPD exacerbation and left hip fracture.   Assessment & Plan:   Principal Problem:   Closed left hip fracture (HCC) Active Problems:   Essential hypertension   COPD (chronic obstructive pulmonary disease) with emphysema (HCC)   GERD   Lung cancer (HCC)   COPD exacerbation (HCC)   Hypothyroidism   Other specified hypothyroidism   Tobacco abuse   Chronic systolic heart failure (HCC)   Hyponatremia   Acute respiratory distress   Closed hip fracture, left -orthopedic recommendations: surgery (postponed secondary to respiratory status) -analgesics  COPD exacerbation Acute respiratory failure with hypoxia Improving. Still requiring O2. -pulmonology recommendations -continue nebs -continue budesonide/brovana -continue steroids -continue levaquin -continue flutter valve -O2 via [place]; wean as tolerated  Chronic systolic heart failure Appears euvolemic. Last EF of 35-40%, mild LVH, diffuse hypokinesis. On spironolactone. -in/out -daily weights  Hyponatremia Improved -continue IV fluids  Hypertension Not on medication therapy  Hypothyroidism -continue synthroid  History of partial seizure Keppra held on admission -continue gabapentin  Tobacco abuse Counseling given  Abdominal distension Constipation Large stool burden seen on x-ray. Likely secondary to opiates. No evidence of bowel obstruction. -scheduled Senokot-S -miralax BID -dulcolax suppository x1  History of small cell lung cancer Diagnosed October 2012. Chart review indicates status post  palliative radiotherapy, systemic chemotherapy prophylactic cranial irradiation, curative stereotactic radiotherapy. Office visit note dated August 2017 [place] recent CT scan showed no evidence for disease progression septic for evolving radiation changes and masslike consolidation in the right upper lobe and posterior right upper lobe. Noted also indicates this is been evaluated with PET scan in the past and showed no hypermetabolic activity -Patient will follow-up in 6 months with oncology   DVT prophylaxis: SCDs Code Status: Full code Family Communication: None at bedside Disposition Plan: Pending improvement in respiratory status   Consultants:   Orthopedic surgery  Pulmonology  Procedures:   None  Antimicrobials:  Azithromycin (11/15)  Levaquin (11/15>>    Subjective: Dyspnea improving. Reports abdominal discomfort. No nausea or vomiting.  Objective: Vitals:   05/14/16 0204 05/14/16 0211 05/14/16 0612 05/14/16 0732  BP:   (!) 145/98   Pulse:   (!) 114 (!) 112  Resp:   18 18  Temp:   98.2 F (36.8 C)   TempSrc:   Oral   SpO2:  96% 94% 95%  Weight: 61.1 kg (134 lb 12.8 oz)     Height:        Intake/Output Summary (Last 24 hours) at 05/14/16 1130 Last data filed at 05/14/16 0614  Gross per 24 hour  Intake          1651.75 ml  Output             1650 ml  Net             1.75 ml   Filed Weights   05/10/16 1023 05/14/16 0204  Weight: 57.2 kg (126 lb) 61.1 kg (134 lb 12.8 oz)    Examination:  General exam: Appears calm and comfortable Respiratory system: Mild wheezing and rhonchi. Work of breathing good while asleep and increased slightly when patient awake. Cardiovascular  system: S1 & S2 heard, RRR. No murmurs, rubs, gallops or clicks. Gastrointestinal system: Abdomen is distended, soft and nontender. Decreased bowel sounds heard. Central nervous system: Alert and oriented. No focal neurological deficits. Extremities: No edema. No calf tenderness Skin: No  cyanosis. No rashes Psychiatry: Judgement and insight appear normal. Mood & affect appropriate.     Data Reviewed: I have personally reviewed following labs and imaging studies  CBC:  Recent Labs Lab 05/10/16 1044 05/11/16 0710  WBC 9.8 9.9  NEUTROABS 8.7*  --   HGB 13.5 12.2  HCT 38.3 36.1  MCV 90.8 91.6  PLT 254 397   Basic Metabolic Panel:  Recent Labs Lab 05/10/16 1044 05/10/16 1452 05/11/16 0710 05/12/16 1028 05/13/16 0938  NA 129*  --  128* 125* 132*  K 4.0  --  4.2 4.5 4.2  CL 93*  --  94* 90* 95*  CO2 24  --  '25 25 30  '$ GLUCOSE 110*  --  139* 156* 139*  BUN 12  --  '10 13 11  '$ CREATININE 0.91 1.10* 0.92 1.17* 0.94  CALCIUM 9.0  --  9.1 9.1 9.0   GFR: Estimated Creatinine Clearance: 47.4 mL/min (by C-G formula based on SCr of 0.94 mg/dL). Liver Function Tests:  Recent Labs Lab 05/10/16 1044  AST 32  ALT 21  ALKPHOS 55  BILITOT 0.9  PROT 6.9  ALBUMIN 3.3*   No results for input(s): LIPASE, AMYLASE in the last 168 hours. No results for input(s): AMMONIA in the last 168 hours. Coagulation Profile:  Recent Labs Lab 05/10/16 1452  INR 1.00   Cardiac Enzymes: No results for input(s): CKTOTAL, CKMB, CKMBINDEX, TROPONINI in the last 168 hours. BNP (last 3 results) No results for input(s): PROBNP in the last 8760 hours. HbA1C: No results for input(s): HGBA1C in the last 72 hours. CBG: No results for input(s): GLUCAP in the last 168 hours. Lipid Profile: No results for input(s): CHOL, HDL, LDLCALC, TRIG, CHOLHDL, LDLDIRECT in the last 72 hours. Thyroid Function Tests: No results for input(s): TSH, T4TOTAL, FREET4, T3FREE, THYROIDAB in the last 72 hours. Anemia Panel: No results for input(s): VITAMINB12, FOLATE, FERRITIN, TIBC, IRON, RETICCTPCT in the last 72 hours. Sepsis Labs:  Recent Labs Lab 05/10/16 1108  LATICACIDVEN 1.40    Recent Results (from the past 240 hour(s))  Surgical pcr screen     Status: Abnormal   Collection Time:  05/10/16  5:34 PM  Result Value Ref Range Status   MRSA, PCR NEGATIVE NEGATIVE Final   Staphylococcus aureus POSITIVE (A) NEGATIVE Final    Comment:        The Xpert SA Assay (FDA approved for NASAL specimens in patients over 74 years of age), is one component of a comprehensive surveillance program.  Test performance has been validated by [facility] for patients greater than or equal to 15 year old. It is not intended to diagnose infection nor to guide or monitor treatment.          Radiology Studies: Dg Chest Port 1 View  Result Date: 05/14/2016 CLINICAL DATA:  Pulmonary mass, shortness of Breath EXAM: PORTABLE CHEST 1 VIEW COMPARISON:  05/10/2016.  Chest CT 01/28/2016 FINDINGS: Chronic changes noted in the right upper lobe, stable compatible with known malignancy. Left lung is clear. Heart is borderline in size. No visible effusions or acute bony abnormality. IMPRESSION: Chronic right upper lobe changes compatible with known malignancy. No acute findings. Electronically Signed   By: Rolm Baptise M.D.  On: 05/14/2016 11:05   Dg Abd Portable 1v  Result Date: 05/14/2016 CLINICAL DATA:  Hemoptysis.  Pulmonary mass. EXAM: PORTABLE ABDOMEN - 1 VIEW COMPARISON:  None. FINDINGS: Large stool burden in the right colon. Gaseous distention of the colon. No evidence of small bowel obstruction, free air organomegaly. IMPRESSION: Large stool burden in the right colon with gaseous distention of the colon. This may reflect adynamic ileus. Electronically Signed   By: Rolm Baptise M.D.   On: 05/14/2016 11:04        Scheduled Meds: . arformoterol  15 mcg Nebulization BID  . aspirin EC  81 mg Oral Daily  . bisacodyl  10 mg Rectal Once  . budesonide (PULMICORT) nebulizer solution  0.5 mg Nebulization BID  . Chlorhexidine Gluconate Cloth  6 each Topical Daily  . gabapentin  300 mg Oral BID  . guaiFENesin  1,200 mg Oral BID  . ipratropium-albuterol  3 mL Nebulization BID  . levofloxacin   500 mg Oral Q24H  . levothyroxine  25 mcg Oral QAC breakfast  . methylPREDNISolone (SOLU-MEDROL) injection  60 mg Intravenous Q2H  . mupirocin ointment  1 application Nasal BID  . pantoprazole  40 mg Oral Daily  . polyethylene glycol  17 g Oral BID  . senna-docusate  1 tablet Oral BID  . simvastatin  20 mg Oral Daily  . white petrolatum       Continuous Infusions: . sodium chloride 75 mL/hr at 05/14/16 0043     LOS: 4 days     Cordelia Poche [facility] 05/14/2016, 11:30 AM Pager: [phone]  If 7PM-7AM, please contact night-coverage [email or web address] Password [email or web address] 05/14/2016, 11:30 AM

## 2016-05-15 DIAGNOSIS — J9621 Acute and chronic respiratory failure with hypoxia: Secondary | ICD-10-CM | POA: Diagnosis not present

## 2016-05-15 DIAGNOSIS — C3411 Malignant neoplasm of upper lobe, right bronchus or lung: Secondary | ICD-10-CM

## 2016-05-15 DIAGNOSIS — I5022 Chronic systolic (congestive) heart failure: Secondary | ICD-10-CM | POA: Diagnosis not present

## 2016-05-15 DIAGNOSIS — J441 Chronic obstructive pulmonary disease with (acute) exacerbation: Secondary | ICD-10-CM | POA: Diagnosis not present

## 2016-05-15 DIAGNOSIS — S72002A Fracture of unspecified part of neck of left femur, initial encounter for closed fracture: Secondary | ICD-10-CM | POA: Diagnosis not present

## 2016-05-15 DIAGNOSIS — R0603 Acute respiratory distress: Secondary | ICD-10-CM | POA: Diagnosis not present

## 2016-05-15 DIAGNOSIS — R0602 Shortness of breath: Secondary | ICD-10-CM | POA: Diagnosis not present

## 2016-05-15 LAB — BASIC METABOLIC PANEL
ANION GAP: 6 (ref 5–15)
BUN: 14 mg/dL (ref 6–20)
CALCIUM: 7.8 mg/dL — AB (ref 8.9–10.3)
CO2: 30 mmol/L (ref 22–32)
Chloride: 97 mmol/L — ABNORMAL LOW (ref 101–111)
Creatinine, Ser: 0.95 mg/dL (ref 0.44–1.00)
GFR calc non Af Amer: 59 mL/min — ABNORMAL LOW (ref >60)
Glucose, Bld: 139 mg/dL — ABNORMAL HIGH (ref 65–99)
Potassium: 4.8 mmol/L (ref 3.5–5.1)
Sodium: 133 mmol/L — ABNORMAL LOW (ref 135–145)

## 2016-05-15 LAB — URINALYSIS, ROUTINE W REFLEX MICROSCOPIC
Glucose, UA: NEGATIVE mg/dL
KETONES UR: NEGATIVE mg/dL
NITRITE: NEGATIVE
PH: 6 (ref 5.0–8.0)
PROTEIN: NEGATIVE mg/dL
Specific Gravity, Urine: 1.028 (ref 1.005–1.030)

## 2016-05-15 LAB — URINE MICROSCOPIC-ADD ON

## 2016-05-15 MED ORDER — FUROSEMIDE 10 MG/ML IJ SOLN
40.0000 mg | Freq: Once | INTRAMUSCULAR | Status: AC
  Administered 2016-05-15: 40 mg via INTRAVENOUS
  Filled 2016-05-15: qty 4

## 2016-05-15 MED ORDER — MILK AND MOLASSES ENEMA
1.0000 | Freq: Once | RECTAL | Status: AC
  Administered 2016-05-15: 250 mL via RECTAL
  Filled 2016-05-15: qty 250

## 2016-05-15 MED ORDER — MAGNESIUM HYDROXIDE 400 MG/5ML PO SUSP
30.0000 mL | Freq: Once | ORAL | Status: AC
  Administered 2016-05-15: 30 mL via ORAL
  Filled 2016-05-15: qty 30

## 2016-05-15 MED ORDER — MAGNESIUM HYDROXIDE 400 MG/5ML PO SUSP: 15.0000 mL | Freq: Once | ORAL | Status: DC

## 2016-05-15 NOTE — Care Management Important Message (Signed)
Important Message  Patient Details  Name: Janice Daniels MRN: [id number] Date of Birth: [birth year]   Medicare Important Message Given:  Yes    Yessenia Maillet Montine Circle 05/15/2016, 11:46 AM

## 2016-05-15 NOTE — Progress Notes (Signed)
PROGRESS NOTE    Janice Daniels  ZHG:[id number] DOB: 1945-08-17 DOA: 05/10/2016 PCP: Donnajean Lopes, MD   Brief Narrative: Janice Daniels is a 70 y.o. 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 She presented with a COPD exacerbation and left hip fracture.   Assessment & Plan:   Principal Problem:   Closed left hip fracture (HCC) Active Problems:   Essential hypertension   COPD (chronic obstructive pulmonary disease) with emphysema (HCC)   GERD   Lung cancer (HCC)   COPD exacerbation (HCC)   Hypothyroidism   Other specified hypothyroidism   Tobacco abuse   Chronic systolic heart failure (HCC)   Hyponatremia   Acute respiratory distress   Closed hip fracture, left -orthopedic recommendations: surgery (postponed secondary to respiratory status) -analgesics  COPD exacerbation Acute respiratory failure with hypoxia Improving. Still requiring O2. Patient was receiving solu-medrol q2 for about 48 hours. I weaned O2 down to 2L in the room. Patient is likely close to baseline -pulmonology recommendations -continue nebs -continue budesonide/brovana -continue Solu-medrol '60mg'$  q12 hours -continue levaquin -continue flutter valve -O2 via [place]; wean as tolerated  Chronic systolic heart failure Appears euvolemic. Last EF of 35-40%, mild LVH, diffuse hypokinesis. On spironolactone. Up about 8 pounds. -in/out -daily weights  Hyponatremia Improved -continue IV fluids  Hypertension Not on medication therapy  Hypothyroidism -continue synthroid  History of partial seizure Keppra held on admission -continue gabapentin  Tobacco abuse Counseling given  Abdominal distension Constipation Large stool burden seen on x-ray. Likely secondary to opiates. No evidence of bowel obstruction.  Had a small bowel movement yesterday -Senokot-S BID -miralax BID -milk and molasses enema x1 -milk of mag x1  History of small cell lung cancer Diagnosed October 2012. Chart review indicates status post palliative radiotherapy, systemic chemotherapy prophylactic cranial irradiation, curative stereotactic radiotherapy. Office visit note dated August 2017 C[place] recent CT scan showed no evidence for disease progression septic for evolving radiation changes and masslike consolidation in the right upper lobe and posterior right upper lobe. Noted also indicates this is been evaluated with PET scan in the past and showed no hypermetabolic activity -Patient will follow-up in 6 months with oncology   DVT prophylaxis: SCDs Code Status: Full code Family Communication: None at bedside Disposition Plan: Pending improvement in respiratory status   Consultants:   Orthopedic surgery  Pulmonology  Procedures:   None  Antimicrobials:  Azithromycin (11/15)  Levaquin (11/15>>    Subjective: Dyspnea improved. Reports abdominal discomfort. No nausea or vomiting.  Objective: Vitals:   05/15/16 0401 05/15/16 0824 05/15/16 0825 05/15/16 0826  BP: (!) 133/91     Pulse: (!) 109     Resp: 18     Temp: 98 F (36.7 C)     TempSrc: Oral     SpO2: 97% 90% 90% 90%  Weight:      Height:        Intake/Output Summary (Last 24 hours) at 05/15/16 1059 Last data filed at 05/15/16 0600  Gross per 24 hour  Intake              870 ml  Output              800 ml  Net               70 ml   Filed Weights   05/10/16 1023  05/14/16 0204  Weight: 57.2 kg (126 lb) 61.1 kg (134 lb 12.8 oz)    Examination:  General exam: Appears calm and comfortable Respiratory system: Mild wheezing and rhonchi bilaterally with decreased breath sounds on right compared to left. Slightly elevated work of breathing Cardiovascular system: S1 & S2 heard, RRR. No murmurs, rubs, gallops or clicks. Gastrointestinal system:  Abdomen is distended, soft and slightly tender in right periumbilical area. Decreased bowel sounds heard. Central nervous system: Alert and oriented. No focal neurological deficits. Extremities: No edema. No calf tenderness Skin: No cyanosis. No rashes Psychiatry: Judgement and insight appear normal. Mood & affect appropriate.     Data Reviewed: I have personally reviewed following labs and imaging studies  CBC:  Recent Labs Lab 05/10/16 1044 05/11/16 0710  WBC 9.8 9.9  NEUTROABS 8.7*  --   HGB 13.5 12.2  HCT 38.3 36.1  MCV 90.8 91.6  PLT 254 275   Basic Metabolic Panel:  Recent Labs Lab 05/10/16 1044 05/10/16 1452 05/11/16 0710 05/12/16 1028 05/13/16 0938 05/15/16 0530  NA 129*  --  128* 125* 132* 133*  K 4.0  --  4.2 4.5 4.2 4.8  CL 93*  --  94* 90* 95* 97*  CO2 24  --  '25 25 30 30  '$ GLUCOSE 110*  --  139* 156* 139* 139*  BUN 12  --  '10 13 11 14  '$ CREATININE 0.91 1.10* 0.92 1.17* 0.94 0.95  CALCIUM 9.0  --  9.1 9.1 9.0 7.8*   GFR: Estimated Creatinine Clearance: 46.9 mL/min (by C-G formula based on SCr of 0.95 mg/dL). Liver Function Tests:  Recent Labs Lab 05/10/16 1044  AST 32  ALT 21  ALKPHOS 55  BILITOT 0.9  PROT 6.9  ALBUMIN 3.3*   No results for input(s): LIPASE, AMYLASE in the last 168 hours. No results for input(s): AMMONIA in the last 168 hours. Coagulation Profile:  Recent Labs Lab 05/10/16 1452  INR 1.00   Cardiac Enzymes: No results for input(s): CKTOTAL, CKMB, CKMBINDEX, TROPONINI in the last 168 hours. BNP (last 3 results) No results for input(s): PROBNP in the last 8760 hours. HbA1C: No results for input(s): HGBA1C in the last 72 hours. CBG: No results for input(s): GLUCAP in the last 168 hours. Lipid Profile: No results for input(s): CHOL, HDL, LDLCALC, TRIG, CHOLHDL, LDLDIRECT in the last 72 hours. Thyroid Function Tests: No results for input(s): TSH, T4TOTAL, FREET4, T3FREE, THYROIDAB in the last 72 hours. Anemia Panel: No  results for input(s): VITAMINB12, FOLATE, FERRITIN, TIBC, IRON, RETICCTPCT in the last 72 hours. Sepsis Labs:  Recent Labs Lab 05/10/16 1108  LATICACIDVEN 1.40    Recent Results (from the past 240 hour(s))  Surgical pcr screen     Status: Abnormal   Collection Time: 05/10/16  5:34 PM  Result Value Ref Range Status   MRSA, PCR NEGATIVE NEGATIVE Final   Staphylococcus aureus POSITIVE (A) NEGATIVE Final    Comment:        The Xpert SA Assay (FDA approved for NASAL specimens in patients over 22 years of age), is one component of a comprehensive surveillance program.  Test performance has been validated by [facility] for patients greater than or equal to 11 year old. It is not intended to diagnose infection nor to guide or monitor treatment.          Radiology Studies: Dg Chest Port 1 View  Result Date: 05/14/2016 CLINICAL DATA:  Pulmonary mass, shortness of Breath EXAM: PORTABLE CHEST 1  VIEW COMPARISON:  05/10/2016.  Chest CT 01/28/2016 FINDINGS: Chronic changes noted in the right upper lobe, stable compatible with known malignancy. Left lung is clear. Heart is borderline in size. No visible effusions or acute bony abnormality. IMPRESSION: Chronic right upper lobe changes compatible with known malignancy. No acute findings. Electronically Signed   By: Rolm Baptise M.D.   On: 05/14/2016 11:05   Dg Abd Portable 1v  Result Date: 05/14/2016 CLINICAL DATA:  Hemoptysis.  Pulmonary mass. EXAM: PORTABLE ABDOMEN - 1 VIEW COMPARISON:  None. FINDINGS: Large stool burden in the right colon. Gaseous distention of the colon. No evidence of small bowel obstruction, free air organomegaly. IMPRESSION: Large stool burden in the right colon with gaseous distention of the colon. This may reflect adynamic ileus. Electronically Signed   By: Rolm Baptise M.D.   On: 05/14/2016 11:04        Scheduled Meds: . arformoterol  15 mcg Nebulization BID  . aspirin EC  81 mg Oral Daily  . budesonide  (PULMICORT) nebulizer solution  0.5 mg Nebulization BID  . Chlorhexidine Gluconate Cloth  6 each Topical Daily  . furosemide  40 mg Intravenous Once  . gabapentin  300 mg Oral BID  . guaiFENesin  1,200 mg Oral BID  . ipratropium-albuterol  3 mL Nebulization BID  . levofloxacin  500 mg Oral Q24H  . levothyroxine  25 mcg Oral QAC breakfast  . methylPREDNISolone (SOLU-MEDROL) injection  60 mg Intravenous Q12H  . mupirocin ointment  1 application Nasal BID  . pantoprazole  40 mg Oral Daily  . polyethylene glycol  17 g Oral BID  . senna-docusate  1 tablet Oral BID  . simvastatin  20 mg Oral Daily   Continuous Infusions:    LOS: 5 days     Cordelia Poche [facility] 05/15/2016, 10:59 AM Pager: [phone]  If 7PM-7AM, please contact night-coverage [email or web address] Password [email or web address] 05/15/2016, 10:59 AM

## 2016-05-15 NOTE — Progress Notes (Signed)
Appreciate pulmonology recs today on when to proceed with surgery.    Azucena Cecil, MD [place] 7:31 AM

## 2016-05-15 NOTE — Progress Notes (Signed)
Name: Janice Daniels MRN: [id number] DOB: [birth year]    ADMISSION DATE:  05/10/2016 CONSULTATION DATE:  11/16 REFERRING MD :  Dr. Lonny Prude [facility]  CHIEF COMPLAINT:  Shortness of Breath  HISTORY OF PRESENT ILLNESS:  This is 70 y/o pt with PMH significant for COPD and Stage III [place] lung cancer. She presents 11/15 with persistent shortness of breath and left hip pain after a fall.  She states about two weeks prior she began have fevers and productive cough.  She talked with PCP over the phone, but nothing was prescribed.  Symptoms improved over the past week, however she is still having SOB.  11/15 she fell at home and presented to ED with hip pain, where she was found to have L hip fracture. She was admitted in this setting also with COPD exacerbation. She was treated with bronchodilators, steroids, and antibiotics with plans for OR on 11/16, however just prior to presenting to OR was found to have increased work of breathing. Surgery postponed and pulmonary consulted.   SUBJECTIVE:  C/o constipation, SOB.  "feel terrible".   VITAL SIGNS: Temp:  [98 F (36.7 C)-98.4 F (36.9 C)] 98 F (36.7 C) (11/20 0401) Pulse Rate:  [109-115] 109 (11/20 0401) Resp:  [18-19] 18 (11/20 0401) BP: (130-133)/(86-100) 133/91 (11/20 0401) SpO2:  [90 %-97 %] 90 % (11/20 0826)  PHYSICAL EXAMINATION: General:  Ill appearing elderly female, remains SOB Neuro:  A/O.  HEENT:  Normocephalic, No drainage from mouth, ears, nose Cardiovascular:  Normal rhythm no MRG Lungs: resps even, labored with exertion or speaking, Course expiratory wheeze, congestion Abdomen:  Soft non tender non distended  Musculoskeletal:  No acute deformities  Skin:  Pallor, grossly intact   Recent Labs Lab 05/12/16 1028 05/13/16 0938 05/15/16 0530  NA 125* 132* 133*  K 4.5 4.2 4.8  CL 90* 95* 97*  CO2 '25 30 30  '$ BUN '13 11 14  '$ CREATININE 1.17* 0.94 0.95  GLUCOSE 156* 139* 139*    Recent Labs Lab 05/10/16 1044  05/11/16 0710  HGB 13.5 12.2  HCT 38.3 36.1  WBC 9.8 9.9  PLT 254 260   Dg Chest Port 1 View  Result Date: 05/14/2016 CLINICAL DATA:  Pulmonary mass, shortness of Breath EXAM: PORTABLE CHEST 1 VIEW COMPARISON:  05/10/2016.  Chest CT 01/28/2016 FINDINGS: Chronic changes noted in the right upper lobe, stable compatible with known malignancy. Left lung is clear. Heart is borderline in size. No visible effusions or acute bony abnormality. IMPRESSION: Chronic right upper lobe changes compatible with known malignancy. No acute findings. Electronically Signed   By: Rolm Baptise M.D.   On: 05/14/2016 11:05   Dg Abd Portable 1v  Result Date: 05/14/2016 CLINICAL DATA:  Hemoptysis.  Pulmonary mass. EXAM: PORTABLE ABDOMEN - 1 VIEW COMPARISON:  None. FINDINGS: Large stool burden in the right colon. Gaseous distention of the colon. No evidence of small bowel obstruction, free air organomegaly. IMPRESSION: Large stool burden in the right colon with gaseous distention of the colon. This may reflect adynamic ileus. Electronically Signed   By: Rolm Baptise M.D.   On: 05/14/2016 11:04    ASSESSMENT / PLAN:  70 yo woman with severe COPD, hx NSCLCA, followed by Dr Lake Bells in our office. She has a L hip fx from mechanical fall, will need surgical repair. Is being treated for AE-COPD trying to optimize respiratory status prior to OR.  Plan: -Supplemental O2 titrate SpO2 88-95% -Continue duo nebs, prn albuterol, budesonide and brovana   -  Continue Levaquin per primary  -Continue solumedrol 60 mg q 12h -Pulmonary hygiene with IS and flutter valve -She will need another 24-48 hours prior to any surgery  Left hip fracture  -Management per Ortho - awaiting OR   H/o RUL limited stage small cell lung CA diagnosed 2012. S/P chemo and radiotherapy in 2014 - Stable radiographic findings on CXR  Tobacco abuse - Smoking cessation counseling  Pt seems to be improving slowly.  Likely needs another 24-48 hours prior  to OR, although she appears to have very poor resp status at baseline.  Per husband she needs assistance with ADL's, he "basically carries her" to the bathroom, she rarely leaves the home.  Continues to smoke.  Continue to treat for AECOPD but unclear how much more we can improve her respiratory status.  She will likely remain at high risk for pulmonary complications.    Nickolas Madrid, NP 05/15/2016  9:24 AM Pager: [phone] or [phone]  STAFF NOTE: I, Merrie Roof, MD FACP have personally reviewed patient's available data, including medical history, events of note, physical examination and test results as part of my evaluation. I have discussed with resident/NP and other care providers such as pharmacist, RN and RRT. In addition, I personally evaluated patient and elicited key findings of: alert, follows commands, she feels weak, mild sob, jvd present, coasre bs with bilateral wheezing mild insp and exp, pcxr with chronic int changes overall, no defined  infiltrates, she remains in copd exacerbation and have concerns about pulm edema component, KVO, last echo ef 35%, add lasix, she sounds wet to somedegree in upper lobes, steroids to remaina dn current BDer regimen, she would be a poor surgical candidate today ro likely tommorro if to OR and would have a high chance of ventilatory complication, I update pt and family in room  Lavon Paganini. Titus Mould, MD, [place] Pgr: [facility] 05/15/2016 10:50 AM

## 2016-05-16 ENCOUNTER — Inpatient Hospital Stay (HOSPITAL_COMMUNITY): Payer: PPO

## 2016-05-16 DIAGNOSIS — R0603 Acute respiratory distress: Secondary | ICD-10-CM

## 2016-05-16 DIAGNOSIS — S72002A Fracture of unspecified part of neck of left femur, initial encounter for closed fracture: Secondary | ICD-10-CM | POA: Diagnosis not present

## 2016-05-16 DIAGNOSIS — R140 Abdominal distension (gaseous): Secondary | ICD-10-CM | POA: Diagnosis not present

## 2016-05-16 DIAGNOSIS — J441 Chronic obstructive pulmonary disease with (acute) exacerbation: Secondary | ICD-10-CM | POA: Diagnosis not present

## 2016-05-16 DIAGNOSIS — R0602 Shortness of breath: Secondary | ICD-10-CM | POA: Diagnosis not present

## 2016-05-16 DIAGNOSIS — J9621 Acute and chronic respiratory failure with hypoxia: Secondary | ICD-10-CM | POA: Diagnosis not present

## 2016-05-16 DIAGNOSIS — I5022 Chronic systolic (congestive) heart failure: Secondary | ICD-10-CM | POA: Diagnosis not present

## 2016-05-16 LAB — BASIC METABOLIC PANEL
ANION GAP: 9 (ref 5–15)
BUN: 21 mg/dL — ABNORMAL HIGH (ref 6–20)
CHLORIDE: 87 mmol/L — AB (ref 101–111)
CO2: 36 mmol/L — AB (ref 22–32)
Calcium: 7.6 mg/dL — ABNORMAL LOW (ref 8.9–10.3)
Creatinine, Ser: 0.97 mg/dL (ref 0.44–1.00)
GFR calc non Af Amer: 57 mL/min — ABNORMAL LOW (ref >60)
GLUCOSE: 140 mg/dL — AB (ref 65–99)
POTASSIUM: 4.2 mmol/L (ref 3.5–5.1)
Sodium: 132 mmol/L — ABNORMAL LOW (ref 135–145)

## 2016-05-16 LAB — CBC
HCT: 33.3 % — ABNORMAL LOW (ref 36.0–46.0)
HEMOGLOBIN: 11.1 g/dL — AB (ref 12.0–15.0)
MCH: 30.7 pg (ref 26.0–34.0)
MCHC: 33.3 g/dL (ref 30.0–36.0)
MCV: 92.2 fL (ref 78.0–100.0)
Platelets: 247 10*3/uL (ref 150–400)
RBC: 3.61 MIL/uL — AB (ref 3.87–5.11)
RDW: 13.6 % (ref 11.5–15.5)
WBC: 15.4 10*3/uL — AB (ref 4.0–10.5)

## 2016-05-16 MED ORDER — PHENOL 1.4 % MT LIQD
1.0000 | OROMUCOSAL | Status: DC | PRN
  Administered 2016-05-16 – 2016-05-21 (×2): 1 via OROMUCOSAL
  Filled 2016-05-16: qty 177

## 2016-05-16 MED ORDER — FUROSEMIDE 10 MG/ML IJ SOLN
40.0000 mg | Freq: Once | INTRAMUSCULAR | Status: AC
  Administered 2016-05-16: 40 mg via INTRAVENOUS
  Filled 2016-05-16: qty 4

## 2016-05-16 MED ORDER — MAGNESIUM CITRATE PO SOLN
1.0000 | Freq: Once | ORAL | Status: AC
  Administered 2016-05-16: 1 via ORAL
  Filled 2016-05-16: qty 296

## 2016-05-16 NOTE — Progress Notes (Signed)
PROGRESS NOTE    Janice Daniels  IOX:[id number] DOB: 10-19-1945 DOA: 05/10/2016 PCP: Donnajean Lopes, MD   Brief Narrative: Janice Daniels is a 70 y.o. 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 She presented with a COPD exacerbation and left hip fracture.   Assessment & Plan:   Principal Problem:   Closed left hip fracture (HCC) Active Problems:   Essential hypertension   COPD (chronic obstructive pulmonary disease) with emphysema (HCC)   GERD   Lung cancer (HCC)   COPD exacerbation (HCC)   Hypothyroidism   Other specified hypothyroidism   Tobacco abuse   Chronic systolic heart failure (HCC)   Hyponatremia   Acute respiratory distress   Closed hip fracture, left -orthopedic recommendations: surgery (postponed secondary to respiratory status) -analgesics  COPD exacerbation Acute respiratory failure with hypoxia Improving. Still requiring O2. Patient was receiving solu-medrol q2 for about 48 hours. Weaned to 2L O2. Improved today -pulmonology recommendations -continue nebs -continue budesonide/brovana -continue Solu-medrol '60mg'$  q12 hours -continue levaquin -continue flutter valve -O2 via [place]; wean as tolerated  Chronic systolic heart failure Appears euvolemic. Last EF of 35-40%, mild LVH, diffuse hypokinesis. On spironolactone. Diuresed yesterday. Down 23lbs from yesterday (unsure if this is correct) -in/out -daily weights  Hyponatremia Improved -continue IV fluids  Hypertension Not on medication therapy  Hypothyroidism -continue synthroid  History of partial seizure Keppra held on admission -continue gabapentin  Tobacco abuse Counseling given  Abdominal distension Constipation Repeat x-ray improved. Still discomfort for patient. Only small bowel  movements. -Senokot-S BID -miralax BID- -magnesium citrate -if no bowel movement, will need to try SMOG  History of small cell lung cancer Diagnosed October 2012. Chart review indicates status post palliative radiotherapy, systemic chemotherapy prophylactic cranial irradiation, curative stereotactic radiotherapy. Office visit note dated August 2017 [place] recent CT scan showed no evidence for disease progression septic for evolving radiation changes and masslike consolidation in the right upper lobe and posterior right upper lobe. Noted also indicates this is been evaluated with PET scan in the past and showed no hypermetabolic activity -Patient will follow-up in 6 months with oncology   DVT prophylaxis: SCDs Code Status: Full code Family Communication: None at bedside Disposition Plan: Pending improvement in respiratory status   Consultants:   Orthopedic surgery  Pulmonology  Procedures:   None  Antimicrobials:  Azithromycin (11/15)  Levaquin (11/15>>    Subjective: Dyspnea continues to improve. Still requiring oxygen. Abdominal discomfort. Wants to have a bowel movement.  Objective: Vitals:   05/16/16 0602 05/16/16 0810 05/16/16 0812 05/16/16 0813  BP: 133/84     Pulse: 100     Resp: 17     Temp: 97.9 F (36.6 C)     TempSrc: Oral     SpO2: 94% 92% 92% 92%  Weight: 50.4 kg (111 lb 3.2 oz)     Height:        Intake/Output Summary (Last 24 hours) at 05/16/16 1058 Last data filed at 05/16/16 0614  Gross per 24 hour  Intake              240 ml  Output             3300 ml  Net            -3060 ml   Filed Weights   05/10/16 1023 05/14/16 0204 05/16/16 0602  Weight: 57.2 kg (126 lb) 61.1 kg (134 lb 12.8 oz) 50.4 kg (111 lb 3.2 oz)    Examination:  General exam: Appears calm and comfortable Respiratory system: Mild wheezing and rhonchi bilaterally with improved breath sounds. Work of breathing improved. Cardiovascular system: S1 & S2 heard, RRR. No murmurs,  rubs, gallops or clicks. Gastrointestinal system: Abdomen is distended, soft and slightly tender in right periumbilical area. Decreased bowel sounds heard. Central nervous system: Alert and oriented. No focal neurological deficits. Extremities: No edema. No calf tenderness Skin: No cyanosis. No rashes Psychiatry: Judgement and insight appear normal. Mood & affect appropriate.     Data Reviewed: I have personally reviewed following labs and imaging studies  CBC:  Recent Labs Lab 05/10/16 1044 05/11/16 0710  WBC 9.8 9.9  NEUTROABS 8.7*  --   HGB 13.5 12.2  HCT 38.3 36.1  MCV 90.8 91.6  PLT 254 482   Basic Metabolic Panel:  Recent Labs Lab 05/11/16 0710 05/12/16 1028 05/13/16 0938 05/15/16 0530 05/16/16 0306  NA 128* 125* 132* 133* 132*  K 4.2 4.5 4.2 4.8 4.2  CL 94* 90* 95* 97* 87*  CO2 '25 25 30 30 '$ 36*  GLUCOSE 139* 156* 139* 139* 140*  BUN '10 13 11 14 '$ 21*  CREATININE 0.92 1.17* 0.94 0.95 0.97  CALCIUM 9.1 9.1 9.0 7.8* 7.6*   GFR: Estimated Creatinine Clearance: 42.3 mL/min (by C-G formula based on SCr of 0.97 mg/dL). Liver Function Tests:  Recent Labs Lab 05/10/16 1044  AST 32  ALT 21  ALKPHOS 55  BILITOT 0.9  PROT 6.9  ALBUMIN 3.3*   No results for input(s): LIPASE, AMYLASE in the last 168 hours. No results for input(s): AMMONIA in the last 168 hours. Coagulation Profile:  Recent Labs Lab 05/10/16 1452  INR 1.00   Cardiac Enzymes: No results for input(s): CKTOTAL, CKMB, CKMBINDEX, TROPONINI in the last 168 hours. BNP (last 3 results) No results for input(s): PROBNP in the last 8760 hours. HbA1C: No results for input(s): HGBA1C in the last 72 hours. CBG: No results for input(s): GLUCAP in the last 168 hours. Lipid Profile: No results for input(s): CHOL, HDL, LDLCALC, TRIG, CHOLHDL, LDLDIRECT in the last 72 hours. Thyroid Function Tests: No results for input(s): TSH, T4TOTAL, FREET4, T3FREE, THYROIDAB in the last 72 hours. Anemia Panel: No  results for input(s): VITAMINB12, FOLATE, FERRITIN, TIBC, IRON, RETICCTPCT in the last 72 hours. Sepsis Labs:  Recent Labs Lab 05/10/16 1108  LATICACIDVEN 1.40    Recent Results (from the past 240 hour(s))  Surgical pcr screen     Status: Abnormal   Collection Time: 05/10/16  5:34 PM  Result Value Ref Range Status   MRSA, PCR NEGATIVE NEGATIVE Final   Staphylococcus aureus POSITIVE (A) NEGATIVE Final    Comment:        The Xpert SA Assay (FDA approved for NASAL specimens in patients over 65 years of age), is one component of a comprehensive surveillance program.  Test performance has been validated by [facility] for patients greater than or equal to 24 year old. It is not intended to diagnose infection nor to guide or monitor treatment.          Radiology Studies: No results found.      Scheduled Meds: . arformoterol  15 mcg Nebulization BID  . aspirin EC  81 mg Oral Daily  . budesonide (PULMICORT) nebulizer solution  0.5 mg Nebulization BID  . gabapentin  300 mg Oral BID  . guaiFENesin  1,200 mg Oral BID  . ipratropium-albuterol  3 mL Nebulization BID  . levofloxacin  500 mg Oral Q24H  . levothyroxine  25 mcg Oral QAC breakfast  . methylPREDNISolone (SOLU-MEDROL) injection  60 mg Intravenous Q12H  . pantoprazole  40 mg Oral Daily  . polyethylene glycol  17 g Oral BID  . senna-docusate  1 tablet Oral BID  . simvastatin  20 mg Oral Daily   Continuous Infusions:    LOS: 6 days     Cordelia Poche [facility] 05/16/2016, 10:58 AM Pager: [phone]  If 7PM-7AM, please contact night-coverage [email or web address] Password [facility] 05/16/2016, 10:58 AM

## 2016-05-16 NOTE — Progress Notes (Addendum)
Name: Janice Daniels MRN: [id number] DOB: 12/02/1945    ADMISSION DATE:  05/10/2016 CONSULTATION DATE:  11/16 REFERRING MD :  Dr. Lonny Prude TRH  CHIEF COMPLAINT:  Shortness of Breath  HISTORY OF PRESENT ILLNESS:  70 y/o pt with PMH significant for COPD and Stage III [place] lung cancer. She presents 11/15 with persistent shortness of breath and left hip pain after a fall.  She states about two weeks prior she began have fevers and productive cough.  She talked with PCP over the phone, but nothing was prescribed.  Symptoms improved over the past week, however she is still having SOB.  11/15 she fell at home and presented to ED with hip pain, where she was found to have L hip fracture. She was admitted in this setting also with COPD exacerbation. She was treated with bronchodilators, steroids, and antibiotics with plans for OR on 11/16, however just prior to presenting to OR was found to have increased work of breathing. Surgery postponed and pulmonary consulted.   SUBJECTIVE:  Patient reports pain in hip, breathing "somewhat improved".  Worried she will have to go home on oxygen.   VITAL SIGNS: Temp:  [97.7 F (36.5 C)-98 F (36.7 C)] 97.9 F (36.6 C) (11/21 0602) Pulse Rate:  [95-101] 100 (11/21 0602) Resp:  [17-19] 17 (11/21 0602) BP: (133-158)/(84-89) 133/84 (11/21 0602) SpO2:  [91 %-100 %] 92 % (11/21 0813) Weight:  [111 lb 3.2 oz (50.4 kg)] 111 lb 3.2 oz (50.4 kg) (11/21 0602)  PHYSICAL EXAMINATION: General:  Ill appearing elderly female in NAD Neuro:  AAO, generalized weakness.  HEENT:  MM pink/moist, no jvd Cardiovascular:  Normal rhythm no MRG Lungs: prolonged exp phase, lungs bilaterally with rhonchi, wet cough Abdomen:  Soft non tender non distended  Musculoskeletal:  No acute deformities  Skin:  Pale/warm, grossly intact   Recent Labs Lab 05/13/16 0938 05/15/16 0530 05/16/16 0306  NA 132* 133* 132*  K 4.2 4.8 4.2  CL 95* 97* 87*  CO2 30 30 36*  BUN 11 14 21*   CREATININE 0.94 0.95 0.97  GLUCOSE 139* 139* 140*    Recent Labs Lab 05/10/16 1044 05/11/16 0710 05/16/16 1243  HGB 13.5 12.2 11.1*  HCT 38.3 36.1 33.3*  WBC 9.8 9.9 15.4*  PLT 254 260 247   Dg Abd Portable 1v  Result Date: 05/16/2016 CLINICAL DATA:  Abdominal distention.  Epigastric pain. EXAM: PORTABLE ABDOMEN - 1 VIEW COMPARISON:  Two days ago FINDINGS: Decreased stool retention and diminished gaseous distention of colon. No small bowel obstruction pattern. No concerning mass effect or gas collection. EKG leads create artifact over the abdomen. Known proximal left femur fracture.  Osteopenia. IMPRESSION: Stool retention and gaseous colon distention are improved from 2 days prior. Electronically Signed   By: Monte Fantasia M.D.   On: 05/16/2016 11:17    ASSESSMENT / PLAN:  70 yo woman with severe COPD, hx NSCLCA, followed by Dr Lake Bells in our office. She has a L hip fx from mechanical fall, will need surgical repair. Is being treated for AE-COPD trying to optimize respiratory status prior to OR.  Plan: -Supplemental O2 titrate SpO2 88-95% -Continue duo nebs, prn albuterol, budesonide and brovana   -Continue Levaquin per primary  -Continue solumedrol 60 mg q 12h -Pulmonary hygiene with IS and flutter valve -monitor another 24 hours prior to planning for anesthesia  -hopeful to extubate post-operatively   Left hip fracture  -Management per Ortho - awaiting OR   H/o RUL  limited stage small cell lung CA diagnosed 2012. S/P chemo and radiotherapy in 2014 - Stable radiographic findings on CXR  Tobacco abuse - Smoking cessation counseling  Pt seems to be improving slowly.  Likely needs another 24 hours prior to OR, although she appears to have very poor resp status at baseline.  Per husband she needs assistance with ADL's, he "basically carries her" to the bathroom, she rarely leaves the home.  Continues to smoke.  Continue to treat for AECOPD but unclear how much more we can  improve her respiratory status.  She will likely remain at high risk for pulmonary complications.    Noe Gens, NP-C [facility] Pgr: [phone] or if no answer [phone] 05/16/2016, 1:32 PM   STAFF NOTE: I, Merrie Roof, MD FACP have personally reviewed patient's available data, including medical history, events of note, physical examination and test results as part of my evaluation. I have discussed with resident/NP and other care providers such as pharmacist, RN and RRT. In addition, I personally evaluated patient and elicited key findings of: feels slight better, less coarse BS, remains with some wheezing but overall major improved after Diuresis, started on lasix with EF prior CHF sys, now neg 3 liters, and renal fxn tolerated well, continued lasix, repeat pcxr in am , bmet and lytes in am wihth this output, it is likely has acute sys chf exacerbation as well, likely will lower steroids in am, dc ABX, no infection noted, micro neg, Na rising also with lasix  Lavon Paganini. Titus Mould, MD, [place] Pgr: [facility] 05/16/2016 3:17 PM

## 2016-05-17 ENCOUNTER — Inpatient Hospital Stay (HOSPITAL_COMMUNITY): Payer: PPO

## 2016-05-17 DIAGNOSIS — J9621 Acute and chronic respiratory failure with hypoxia: Secondary | ICD-10-CM | POA: Diagnosis not present

## 2016-05-17 DIAGNOSIS — K5900 Constipation, unspecified: Secondary | ICD-10-CM | POA: Diagnosis not present

## 2016-05-17 DIAGNOSIS — S72002D Fracture of unspecified part of neck of left femur, subsequent encounter for closed fracture with routine healing: Secondary | ICD-10-CM | POA: Diagnosis not present

## 2016-05-17 DIAGNOSIS — R0602 Shortness of breath: Secondary | ICD-10-CM | POA: Diagnosis not present

## 2016-05-17 DIAGNOSIS — N179 Acute kidney failure, unspecified: Secondary | ICD-10-CM | POA: Diagnosis not present

## 2016-05-17 DIAGNOSIS — R0603 Acute respiratory distress: Secondary | ICD-10-CM | POA: Diagnosis not present

## 2016-05-17 DIAGNOSIS — J441 Chronic obstructive pulmonary disease with (acute) exacerbation: Secondary | ICD-10-CM | POA: Diagnosis not present

## 2016-05-17 DIAGNOSIS — I5022 Chronic systolic (congestive) heart failure: Secondary | ICD-10-CM | POA: Diagnosis not present

## 2016-05-17 LAB — MRSA PCR SCREENING: MRSA by PCR: NEGATIVE

## 2016-05-17 LAB — BLOOD GAS, ARTERIAL
ACID-BASE EXCESS: 17.8 mmol/L — AB (ref 0.0–2.0)
Bicarbonate: 42.9 mmol/L — ABNORMAL HIGH (ref 20.0–28.0)
Drawn by: 24486
O2 CONTENT: 5 L/min
O2 SAT: 91.8 %
PCO2 ART: 58.7 mmHg — AB (ref 32.0–48.0)
PH ART: 7.477 — AB (ref 7.350–7.450)
PO2 ART: 64.6 mmHg — AB (ref 83.0–108.0)
Patient temperature: 98.5

## 2016-05-17 LAB — BASIC METABOLIC PANEL
Anion gap: 10 (ref 5–15)
BUN: 21 mg/dL — AB (ref 6–20)
CHLORIDE: 85 mmol/L — AB (ref 101–111)
CO2: 39 mmol/L — ABNORMAL HIGH (ref 22–32)
Calcium: 7.9 mg/dL — ABNORMAL LOW (ref 8.9–10.3)
Creatinine, Ser: 0.91 mg/dL (ref 0.44–1.00)
Glucose, Bld: 123 mg/dL — ABNORMAL HIGH (ref 65–99)
POTASSIUM: 4.5 mmol/L (ref 3.5–5.1)
SODIUM: 134 mmol/L — AB (ref 135–145)

## 2016-05-17 LAB — URINE CULTURE: CULTURE: NO GROWTH

## 2016-05-17 LAB — PHOSPHORUS: PHOSPHORUS: 3.1 mg/dL (ref 2.5–4.6)

## 2016-05-17 LAB — MAGNESIUM: MAGNESIUM: 3.5 mg/dL — AB (ref 1.7–2.4)

## 2016-05-17 MED ORDER — FUROSEMIDE 10 MG/ML IJ SOLN
40.0000 mg | Freq: Once | INTRAMUSCULAR | Status: AC
  Administered 2016-05-17: 40 mg via INTRAVENOUS
  Filled 2016-05-17: qty 4

## 2016-05-17 MED ORDER — ALBUTEROL SULFATE (2.5 MG/3ML) 0.083% IN NEBU
5.0000 mg | INHALATION_SOLUTION | Freq: Once | RESPIRATORY_TRACT | Status: AC
  Administered 2016-05-17: 5 mg via RESPIRATORY_TRACT
  Filled 2016-05-17: qty 6

## 2016-05-17 MED ORDER — METHYLPREDNISOLONE SODIUM SUCC 40 MG IJ SOLR: 40.0000 mg | Freq: Two times a day (BID) | INTRAMUSCULAR | Status: DC

## 2016-05-17 MED ORDER — OXYCODONE-ACETAMINOPHEN 5-325 MG PO TABS
1.0000 | ORAL_TABLET | Freq: Four times a day (QID) | ORAL | Status: DC | PRN
  Administered 2016-05-18: 1 via ORAL
  Filled 2016-05-17: qty 1

## 2016-05-17 MED ORDER — METHYLPREDNISOLONE SODIUM SUCC 125 MG IJ SOLR
80.0000 mg | Freq: Three times a day (TID) | INTRAMUSCULAR | Status: DC
  Administered 2016-05-17 – 2016-05-20 (×9): 80 mg via INTRAVENOUS
  Filled 2016-05-17 (×9): qty 2

## 2016-05-17 MED ORDER — FLEET ENEMA 7-19 GM/118ML RE ENEM: 1.0000 | ENEMA | Freq: Every day | RECTAL | Status: DC | PRN

## 2016-05-17 MED ORDER — BISACODYL 10 MG RE SUPP
10.0000 mg | Freq: Once | RECTAL | Status: AC
  Administered 2016-05-17: 10 mg via RECTAL
  Filled 2016-05-17: qty 1

## 2016-05-17 MED ORDER — MAGIC MOUTHWASH
5.0000 mL | Freq: Four times a day (QID) | ORAL | Status: AC
  Administered 2016-05-17 – 2016-05-24 (×22): 5 mL via ORAL
  Filled 2016-05-17 (×28): qty 5

## 2016-05-17 NOTE — Progress Notes (Signed)
   Name: Janice Daniels MRN: [id number] DOB: [birth year]    ADMISSION DATE:  05/10/2016 CONSULTATION DATE:  11/16 REFERRING MD :  Dr. Lonny Prude TRH  CHIEF COMPLAINT:  Shortness of Breath  HISTORY OF PRESENT ILLNESS:  70 y/o pt with PMH significant for COPD and Stage III [place] lung cancer. She presents 11/15 with persistent shortness of breath and left hip pain after a fall.  She states about two weeks prior she began have fevers and productive cough.  She talked with PCP over the phone, but nothing was prescribed.  Symptoms improved over the past week, however she is still having SOB.  11/15 she fell at home and presented to ED with hip pain, where she was found to have L hip fracture. She was admitted in this setting also with COPD exacerbation. She was treated with bronchodilators, steroids, and antibiotics with plans for OR on 11/16, however just prior to presenting to OR was found to have increased work of breathing. Surgery postponed and pulmonary consulted.   SUBJECTIVE:  Increased sob this am, no BM, abdo distention  VITAL SIGNS: Temp:  [97.8 F (36.6 C)-98 F (36.7 C)] 98 F (36.7 C) (11/22 0455) Pulse Rate:  [100-107] 100 (11/22 0455) Resp:  [16] 16 (11/22 0455) BP: (116-135)/(68-84) 133/83 (11/22 0455) SpO2:  [91 %-96 %] 92 % (11/22 0851)  PHYSICAL EXAMINATION: General:  Ill appearing elderly female in mild distress speaking full sentecnes Neuro:  AAO, generalized weakness.  HEENT:  MM pink/moist,jvd is down but present Cardiovascular:  s1 s2 Normal rhythm no MRG Lungs:less crackles bases,m redued BS rt worse left Abdomen:  Soft non tender non distended  Musculoskeletal:  No acute deformities  Skin:  Pale/warm, grossly intact   Recent Labs Lab 05/15/16 0530 05/16/16 0306 05/17/16 0520  NA 133* 132* 134*  K 4.8 4.2 4.5  CL 97* 87* 85*  CO2 30 36* 39*  BUN 14 21* 21*  CREATININE 0.95 0.97 0.91  GLUCOSE 139* 140* 123*    Recent Labs Lab 05/11/16 0710  05/16/16 1243  HGB 12.2 11.1*  HCT 36.1 33.3*  WBC 9.9 15.4*  PLT 260 247   Dg Abd Portable 1v  Result Date: 05/16/2016 CLINICAL DATA:  Abdominal distention.  Epigastric pain. EXAM: PORTABLE ABDOMEN - 1 VIEW COMPARISON:  Two days ago FINDINGS: Decreased stool retention and diminished gaseous distention of colon. No small bowel obstruction pattern. No concerning mass effect or gas collection. EKG leads create artifact over the abdomen. Known proximal left femur fracture.  Osteopenia. IMPRESSION: Stool retention and gaseous colon distention are improved from 2 days prior. Electronically Signed   By: Monte Fantasia M.D.   On: 05/16/2016 11:17    ASSESSMENT / PLAN:  70 yo woman with severe COPD, hx NSCLCA, followed by Dr Lake Bells in our office. She has a L hip fx from mechanical fall, will need surgical repair. Is being treated for AE-COPD trying to optimize respiratory status prior to OR. 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 S/P chemo and radiotherapy in 2014 Left hip fracture   Plan: -she appears worse today, get STAT pcxr, abg - additional dose neb albuterol - increase O2 -lasix maintain, bmet in am  -she needs BM, per primary, this is impacting her resp status I believe -pending above response and evaluation, may need to go to sdu -re increase steroids  Lavon Paganini. Titus Mould, MD, [place] Pgr: [facility]

## 2016-05-17 NOTE — Progress Notes (Signed)
Patient transferred to 3 [facility] room 20.  RN gave report to night RN coming onto shift.  Patient transferred with Telemetry and Oxygen 3L.  Patient stable upon arrival, complaints of hip pain due to ride across hospital from [place]. Night RN agreed to address pain once patient settled in and in their system.

## 2016-05-17 NOTE — Progress Notes (Signed)
PROGRESS NOTE    Janice Daniels  ONG:[id number] DOB: April 21, 1946 DOA: 05/10/2016 PCP: Donnajean Lopes, MD   Brief Narrative: Janice Daniels is a 70 y.o. 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 She presented with a COPD exacerbation and left hip fracture. Surgery on hold as respiratory status is being optimized. PCCM consulting.    Assessment & Plan:   Principal Problem:   Closed left hip fracture (HCC) Active Problems:   Essential hypertension   COPD (chronic obstructive pulmonary disease) with emphysema (HCC)   GERD   Lung cancer (HCC)   COPD exacerbation (HCC)   Hypothyroidism   Other specified hypothyroidism   Tobacco abuse   Chronic systolic heart failure (HCC)   Hyponatremia   Acute respiratory distress   Closed hip fracture, left -orthopedic recommendations: surgery (postponed secondary to respiratory status) -analgesics. Did not complain of much pain today. Discussed at length with patient and niece at bedside regarding nonoperative management (which she asked about) which would put her at increased risk of poor quality of life, bedbound nonambulatory status, other complications such as bedsores aspiration pneumonia etc. versus surgical fixation which would definitely be with associated increased risks from multiple comorbidities but would improve quality of life.  Acute respiratory failure with hypoxia secondary to COPD exacerbation and decompensated CHF - Pulmonary consultation and follow-up appreciated. Discussed with Dr. Titus Daniels. He indicated that respiratory status today was worse than yesterday but also stated that she is not very far from her baseline. Chest x-ray without acute findings. ABG reviewed. - Continue oxygen, bronchodilator nebulizations, Lasix, increasing  Solu-Medrol, continue budesonide/Brovana and levofloxacin. - Also attempt to relieve constipation and abdominal distention which is causing worsening dyspnea from splinting of her diaphragm. - Transferred to stepdown for close monitoring and management.  Acute on Chronic systolic heart failure Last EF of 35-40%, mild LVH, diffuse hypokinesis. On spironolactone. Weight is down from 126 > 111 pounds since admission. Continue Lasix. - -5.2 L since admission.  Hyponatremia Improved Continue Lasix. Follow BMP  Hypertension Not on medication therapy  Hypothyroidism -continue synthroid  History of partial seizure Keppra held on admission -continue gabapentin  Tobacco abuse Counseling given  Abdominal distension Constipation Repeat x-ray improved. Still discomfort for patient. Only small bowel movements. -Senokot-S BID -miralax BID- -magnesium citrate - Continued to complain of being "blocked up" this am. Trial of Dulcolax suppositories and if it doesn't work then [employer]. Discussed with RN. - As per RN, patient apparently had a large BM after Dulcolax suppository and is feeling better and improved abdominal distention.  History of small cell lung cancer Diagnosed October 2012. Chart review indicates status post palliative radiotherapy, systemic chemotherapy prophylactic cranial irradiation, curative stereotactic radiotherapy. Office visit note dated August 2017 Janice Daniels recent CT scan showed no evidence for disease progression septic for evolving radiation changes and masslike consolidation in the right upper lobe and posterior right upper lobe. Noted also indicates this is been evaluated with PET scan in the past and showed no hypermetabolic activity -Patient will follow-up in 6 months with oncology  Anemia - Follow CBC.   DVT prophylaxis: SCDs Code Status: Full code Family Communication: Discussed in detail with patient's niece and granddaughter at bedside. Disposition Plan:  Pending improvement in respiratory status   Consultants:   Orthopedic surgery  Pulmonology  Procedures:   None  Antimicrobials:  Azithromycin (  11/15)  Levaquin (11/15>>    Subjective: I am "stopped up" indicating constipation. Last good BM approximately 2 weeks ago. History of chronic constipation. Passing small flatus. No nausea or vomiting. Has abdominal distention without pain.  Objective: Vitals:   05/16/16 1447 05/16/16 2044 05/17/16 0455 05/17/16 0851  BP: 116/68 135/84 133/83   Pulse: 100 (!) 107 100   Resp:  16 16   Temp: 98 F (36.7 C) 97.8 F (36.6 C) 98 F (36.7 C)   TempSrc: Oral Oral Oral   SpO2: 91% 96% 93% 92%  Weight:      Height:        Intake/Output Summary (Last 24 hours) at 05/17/16 1736 Last data filed at 05/17/16 0631  Gross per 24 hour  Intake              600 ml  Output             1150 ml  Net             -550 ml   Filed Weights   05/10/16 1023 05/14/16 0204 05/16/16 0602  Weight: 57.2 kg (126 lb) 61.1 kg (134 lb 12.8 oz) 50.4 kg (111 lb 3.2 oz)    Examination:  General exam: Elderly female, chronically looking, lying comfortably propped up in bed and in no obvious distress. Respiratory system: Reduced breath sounds bilaterally with scattered few bilateral expiratory rhonchi and occasional basal crackles. No increased work of breathing when seen this morning. Cardiovascular system: S1 & S2 heard, RRR. No murmurs, rubs, gallops or clicks. Telemetry: SR-ST in the 100s. Gastrointestinal system: Abdomen is distended, soft and nontender. Normal bowel sounds heard. Central nervous system: Alert and oriented. No focal neurological deficits. Extremities: No edema. No calf tenderness Skin: No cyanosis. No rashes Psychiatry: Judgement and insight appear Impaired. Mood & affect appropriate.     Data Reviewed: I have personally reviewed following labs and imaging studies  CBC:  Recent Labs Lab 05/11/16 0710 05/16/16 1243  WBC 9.9  15.4*  HGB 12.2 11.1*  HCT 36.1 33.3*  MCV 91.6 92.2  PLT 260 629   Basic Metabolic Panel:  Recent Labs Lab 05/12/16 1028 05/13/16 0938 05/15/16 0530 05/16/16 0306 05/17/16 0520  NA 125* 132* 133* 132* 134*  K 4.5 4.2 4.8 4.2 4.5  CL 90* 95* 97* 87* 85*  CO2 '25 30 30 '$ 36* 39*  GLUCOSE 156* 139* 139* 140* 123*  BUN '13 11 14 '$ 21* 21*  CREATININE 1.17* 0.94 0.95 0.97 0.91  CALCIUM 9.1 9.0 7.8* 7.6* 7.9*  MG  --   --   --   --  3.5*  PHOS  --   --   --   --  3.1   GFR: Estimated Creatinine Clearance: 45.1 mL/min (by C-G formula based on SCr of 0.91 mg/dL). Liver Function Tests: No results for input(s): AST, ALT, ALKPHOS, BILITOT, PROT, ALBUMIN in the last 168 hours. No results for input(s): LIPASE, AMYLASE in the last 168 hours. No results for input(s): AMMONIA in the last 168 hours. Coagulation Profile: No results for input(s): INR, PROTIME in the last 168 hours. Cardiac Enzymes: No results for input(s): CKTOTAL, CKMB, CKMBINDEX, TROPONINI in the last 168 hours. BNP (last 3 results) No results for input(s): PROBNP in the last 8760 hours. HbA1C: No results for input(s): HGBA1C in the last 72 hours. CBG: No results for input(s): GLUCAP in the last 168 hours. Lipid Profile: No results for input(s): CHOL, HDL, LDLCALC, TRIG, CHOLHDL, LDLDIRECT  in the last 72 hours. Thyroid Function Tests: No results for input(s): TSH, T4TOTAL, FREET4, T3FREE, THYROIDAB in the last 72 hours. Anemia Panel: No results for input(s): VITAMINB12, FOLATE, FERRITIN, TIBC, IRON, RETICCTPCT in the last 72 hours. Sepsis Labs: No results for input(s): PROCALCITON, LATICACIDVEN in the last 168 hours.  Recent Results (from the past 240 hour(s))  Surgical pcr screen     Status: Abnormal   Collection Time: 05/10/16  5:34 PM  Result Value Ref Range Status   MRSA, PCR NEGATIVE NEGATIVE Final   Staphylococcus aureus POSITIVE (A) NEGATIVE Final    Comment:        The Xpert SA Assay (FDA approved for  NASAL specimens in patients over 9 years of age), is one component of a comprehensive surveillance program.  Test performance has been validated by [facility] for patients greater than or equal to 45 year old. It is not intended to diagnose infection nor to guide or monitor treatment.   Culture, Urine     Status: None   Collection Time: 05/15/16 10:22 AM  Result Value Ref Range Status   Specimen Description URINE, RANDOM  Final   Special Requests ADDED [id number]  Final   Culture NO GROWTH  Final   Report Status 05/17/2016 FINAL  Final         Radiology Studies: Dg Chest Port 1 View  Result Date: 05/17/2016 CLINICAL DATA:  Short of breath EXAM: PORTABLE CHEST 1 VIEW COMPARISON:  05/14/2016 FINDINGS: The heart is normal in size. Persistent volume loss in the right lung is stable. Hazy opacity towards the right apex is unchanged. There is linear scarring in the right upper lung. Left lung is clear. No pneumothorax or pleural effusion. IMPRESSION: Stable changes related of malignancy and volume loss in the right lung. Electronically Signed   By: Marybelle Killings M.D.   On: 05/17/2016 14:30   Dg Abd Portable 1v  Result Date: 05/16/2016 CLINICAL DATA:  Abdominal distention.  Epigastric pain. EXAM: PORTABLE ABDOMEN - 1 VIEW COMPARISON:  Two days ago FINDINGS: Decreased stool retention and diminished gaseous distention of colon. No small bowel obstruction pattern. No concerning mass effect or gas collection. EKG leads create artifact over the abdomen. Known proximal left femur fracture.  Osteopenia. IMPRESSION: Stool retention and gaseous colon distention are improved from 2 days prior. Electronically Signed   By: Monte Fantasia M.D.   On: 05/16/2016 11:17        Scheduled Meds: . arformoterol  15 mcg Nebulization BID  . aspirin EC  81 mg Oral Daily  . budesonide (PULMICORT) nebulizer solution  0.5 mg Nebulization BID  . gabapentin  300 mg Oral BID  . guaiFENesin  1,200 mg Oral  BID  . ipratropium-albuterol  3 mL Nebulization BID  . levothyroxine  25 mcg Oral QAC breakfast  . methylPREDNISolone (SOLU-MEDROL) injection  80 mg Intravenous Q8H  . pantoprazole  40 mg Oral Daily  . polyethylene glycol  17 g Oral BID  . senna-docusate  1 tablet Oral BID  . simvastatin  20 mg Oral Daily   Continuous Infusions:    LOS: 7 days     Kateline Kinkade, MD, FACP, FHM. [facility] Pager [phone]  If 7PM-7AM, please contact night-coverage [email or web address] Password [facility] 05/17/2016, 5:49 PM

## 2016-05-18 ENCOUNTER — Inpatient Hospital Stay (HOSPITAL_COMMUNITY): Payer: PPO

## 2016-05-18 ENCOUNTER — Other Ambulatory Visit (HOSPITAL_COMMUNITY): Payer: PPO

## 2016-05-18 DIAGNOSIS — I5022 Chronic systolic (congestive) heart failure: Secondary | ICD-10-CM | POA: Diagnosis not present

## 2016-05-18 DIAGNOSIS — R0602 Shortness of breath: Secondary | ICD-10-CM | POA: Diagnosis not present

## 2016-05-18 DIAGNOSIS — R0603 Acute respiratory distress: Secondary | ICD-10-CM | POA: Diagnosis not present

## 2016-05-18 DIAGNOSIS — C3401 Malignant neoplasm of right main bronchus: Secondary | ICD-10-CM

## 2016-05-18 DIAGNOSIS — J441 Chronic obstructive pulmonary disease with (acute) exacerbation: Secondary | ICD-10-CM | POA: Diagnosis not present

## 2016-05-18 DIAGNOSIS — J439 Emphysema, unspecified: Secondary | ICD-10-CM | POA: Diagnosis not present

## 2016-05-18 DIAGNOSIS — S72002D Fracture of unspecified part of neck of left femur, subsequent encounter for closed fracture with routine healing: Secondary | ICD-10-CM | POA: Diagnosis not present

## 2016-05-18 DIAGNOSIS — K5641 Fecal impaction: Secondary | ICD-10-CM | POA: Diagnosis not present

## 2016-05-18 DIAGNOSIS — J9621 Acute and chronic respiratory failure with hypoxia: Secondary | ICD-10-CM | POA: Diagnosis not present

## 2016-05-18 DIAGNOSIS — N179 Acute kidney failure, unspecified: Secondary | ICD-10-CM | POA: Diagnosis not present

## 2016-05-18 DIAGNOSIS — K5900 Constipation, unspecified: Secondary | ICD-10-CM | POA: Diagnosis not present

## 2016-05-18 LAB — URINALYSIS, ROUTINE W REFLEX MICROSCOPIC
Bilirubin Urine: NEGATIVE
Glucose, UA: NEGATIVE mg/dL
Ketones, ur: NEGATIVE mg/dL
LEUKOCYTES UA: NEGATIVE
Nitrite: NEGATIVE
PROTEIN: 30 mg/dL — AB
SPECIFIC GRAVITY, URINE: 1.017 (ref 1.005–1.030)
pH: 8.5 — ABNORMAL HIGH (ref 5.0–8.0)

## 2016-05-18 LAB — CBC
HCT: 37.3 % (ref 36.0–46.0)
HEMOGLOBIN: 12.2 g/dL (ref 12.0–15.0)
MCH: 30.9 pg (ref 26.0–34.0)
MCHC: 32.7 g/dL (ref 30.0–36.0)
MCV: 94.4 fL (ref 78.0–100.0)
Platelets: 277 10*3/uL (ref 150–400)
RBC: 3.95 MIL/uL (ref 3.87–5.11)
RDW: 14.1 % (ref 11.5–15.5)
WBC: 15.4 10*3/uL — AB (ref 4.0–10.5)

## 2016-05-18 LAB — URINE MICROSCOPIC-ADD ON

## 2016-05-18 LAB — BASIC METABOLIC PANEL
ANION GAP: 10 (ref 5–15)
BUN: 24 mg/dL — AB (ref 6–20)
CHLORIDE: 87 mmol/L — AB (ref 101–111)
CO2: 40 mmol/L — ABNORMAL HIGH (ref 22–32)
Calcium: 8 mg/dL — ABNORMAL LOW (ref 8.9–10.3)
Creatinine, Ser: 0.98 mg/dL (ref 0.44–1.00)
GFR calc Af Amer: >60 mL/min (ref >60)
GFR calc non Af Amer: 57 mL/min — ABNORMAL LOW (ref >60)
GLUCOSE: 161 mg/dL — AB (ref 65–99)
POTASSIUM: 4.3 mmol/L (ref 3.5–5.1)
Sodium: 137 mmol/L (ref 135–145)

## 2016-05-18 LAB — MAGNESIUM: Magnesium: 3.5 mg/dL — ABNORMAL HIGH (ref 1.7–2.4)

## 2016-05-18 LAB — PHOSPHORUS: PHOSPHORUS: 3.4 mg/dL (ref 2.5–4.6)

## 2016-05-18 MED ORDER — CEFAZOLIN SODIUM-DEXTROSE 2-4 GM/100ML-% IV SOLN: 2.0000 g | INTRAVENOUS | Status: DC

## 2016-05-18 MED ORDER — POVIDONE-IODINE 10 % EX SWAB: 2.0000 "application " | Freq: Once | CUTANEOUS | Status: DC

## 2016-05-18 MED ORDER — IPRATROPIUM-ALBUTEROL 0.5-2.5 (3) MG/3ML IN SOLN
3.0000 mL | Freq: Four times a day (QID) | RESPIRATORY_TRACT | Status: DC
Start: 2016-05-18 — End: 2016-05-18
  Administered 2016-05-18: 3 mL via RESPIRATORY_TRACT
  Filled 2016-05-18: qty 3

## 2016-05-18 MED ORDER — MORPHINE SULFATE (PF) 2 MG/ML IV SOLN
2.0000 mg | INTRAVENOUS | Status: DC | PRN
  Administered 2016-05-19: 2 mg via INTRAVENOUS
  Filled 2016-05-18 (×2): qty 1

## 2016-05-18 MED ORDER — CEFAZOLIN SODIUM-DEXTROSE 2-4 GM/100ML-% IV SOLN
2.0000 g | INTRAVENOUS | Status: DC
  Filled 2016-05-18 (×2): qty 100

## 2016-05-18 MED ORDER — IPRATROPIUM-ALBUTEROL 0.5-2.5 (3) MG/3ML IN SOLN
3.0000 mL | Freq: Four times a day (QID) | RESPIRATORY_TRACT | Status: DC
  Administered 2016-05-18 – 2016-05-23 (×16): 3 mL via RESPIRATORY_TRACT
  Filled 2016-05-18 (×16): qty 3

## 2016-05-18 NOTE — Progress Notes (Signed)
PROGRESS NOTE    Janice Daniels  ZDG:[id number] DOB: [birth year] DOA: 05/10/2016 PCP: Donnajean Lopes, MD   Brief Narrative: Janice Daniels is a 70 y.o. 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 She presented with a COPD exacerbation and left hip fracture. Surgery on hold as respiratory status is being optimized. PCCM consulting. Orthopedics has discussed with pulmonology who indicated that her respiratory status is as optimal as can be and to proceed with possible surgery on 11/24.  Assessment & Plan:   Principal Problem:   Closed left hip fracture (HCC) Active Problems:   Essential hypertension   COPD (chronic obstructive pulmonary disease) with emphysema (HCC)   GERD   Lung cancer (HCC)   COPD exacerbation (HCC)   Hypothyroidism   Other specified hypothyroidism   Tobacco abuse   Chronic systolic heart failure (HCC)   Hyponatremia   Acute respiratory distress   Closed hip fracture, left -orthopedic recommendations: surgery (postponed secondary to respiratory status) -analgesics. Did not complain of much pain today. Discussed at length with patient and niece at bedside regarding nonoperative management (which she asked about) which would put her at increased risk of poor quality of life, bedbound nonambulatory status, other complications such as bedsores aspiration pneumonia etc. versus surgical fixation which would definitely be with associated increased risks from multiple comorbidities but would improve quality of life. - Possible surgery on 11/24. Discussed with orthopedics. Pulmonology follow-up appreciated. Patient is at high risk for perioperative cardiovascular complications related to underlying COPD and CHF which have been optimized as best as possible.  Acute respiratory failure  with hypoxia secondary to COPD exacerbation and decompensated CHF - Pulmonary consultation and follow-up appreciated. Discussed with Dr. Titus Mould. He indicated that respiratory status today was worse than yesterday but also stated that she is not very far from her baseline. Chest x-ray without acute findings. ABG reviewed. - Continue oxygen, bronchodilator nebulizations, Lasix, increasing Solu-Medrol, continue budesonide/Brovana and levofloxacin. - Also attempt to relieve constipation and abdominal distention which is causing worsening dyspnea from splinting of her diaphragm. - Transferred to stepdown on 11/22 for close monitoring and management. Patient is currently in stepdown on 3 W. Clinically improved. Some of her respiratory issues are precipitated by pain which is better controlled with change in position.  Acute on Chronic systolic heart failure Last EF of 35-40%, mild LVH, diffuse hypokinesis. On spironolactone. Weight is down from 126 > 111 pounds since admission. Continue Lasix. - - 6.18 L since admission. Improved and appears compensated.  Hyponatremia Resolved Continue Lasix. Follow BMP  Hypertension Not on medication therapy  Hypothyroidism -continue synthroid  History of partial seizure Keppra held on admission -continue gabapentin  Tobacco abuse Counseling given  Abdominal distension Constipation Treated aggressively with bowel regimen. Multiple BMs on 11/22 with significant improvement in symptoms and abdominal distention. Follow-up chest x-ray shows no fecal impaction. Continue bowel regimen.  History of small cell lung cancer Diagnosed October 2012. Chart review indicates status post palliative radiotherapy, systemic chemotherapy prophylactic cranial irradiation, curative stereotactic radiotherapy. Office visit note dated August 2017 Cates recent CT scan showed no evidence for disease progression septic for evolving radiation changes and masslike consolidation in  the right upper lobe and posterior right upper lobe. Noted also indicates this is been evaluated with PET scan in the past and showed no hypermetabolic activity -Patient will follow-up in  6 months with oncology  Anemia - Follow CBC.   DVT prophylaxis: SCDs Code Status: Full code Family Communication: None at bedside Disposition Plan: To be determined.   Consultants:   Orthopedic surgery  Pulmonology  Procedures:   None  Antimicrobials:  Azithromycin (11/15)  Levaquin (11/15>>    Subjective: Multiple BMs in the last 24 hours. Abdomen feels better. Intermittent dyspnea, worsened due to hip pain. No chest pain reported.  Objective: Vitals:   05/18/16 1040 05/18/16 1210 05/18/16 1313 05/18/16 1405  BP:      Pulse: 90 89  98  Resp: '19 17  14  '$ Temp:  98 F (36.7 C)    TempSrc:  Oral    SpO2: 98% 98% 98% 98%  Weight:      Height:      BP 153/95 mmHg.  Intake/Output Summary (Last 24 hours) at 05/18/16 1508 Last data filed at 05/18/16 1500  Gross per 24 hour  Intake              480 ml  Output             1375 ml  Net             -895 ml   Filed Weights   05/16/16 0602 05/17/16 1922 05/18/16 0500  Weight: 50.4 kg (111 lb 3.2 oz) 57.2 kg (126 lb) 59.2 kg (130 lb 9.6 oz)    Examination:  General exam: Elderly female, chronically ill looking, lying comfortably propped up in bed and in no obvious distress. Respiratory system: Reduced breath sounds bilaterally with scattered few bilateral expiratory rhonchi and occasional basal crackles-all much improved compared to 11/22. No increased work of breathing when seen this morning. Cardiovascular system: S1 & S2 heard, RRR. No murmurs, rubs, gallops or clicks. Telemetry: SR. Gastrointestinal system: Abdomen is mildly distended but soft and nontender. Normal bowel sounds heard. Central nervous system: Alert and oriented. No focal neurological deficits. Extremities: No edema. No calf tenderness Skin: No cyanosis. No  rashes Psychiatry: Judgement and insight appear Impaired. Mood & affect appropriate.     Data Reviewed: I have personally reviewed following labs and imaging studies  CBC:  Recent Labs Lab 05/16/16 1243 05/18/16 0338  WBC 15.4* 15.4*  HGB 11.1* 12.2  HCT 33.3* 37.3  MCV 92.2 94.4  PLT 247 297   Basic Metabolic Panel:  Recent Labs Lab 05/13/16 0938 05/15/16 0530 05/16/16 0306 05/17/16 0520 05/18/16 0338  NA 132* 133* 132* 134* 137  K 4.2 4.8 4.2 4.5 4.3  CL 95* 97* 87* 85* 87*  CO2 30 30 36* 39* 40*  GLUCOSE 139* 139* 140* 123* 161*  BUN 11 14 21* 21* 24*  CREATININE 0.94 0.95 0.97 0.91 0.98  CALCIUM 9.0 7.8* 7.6* 7.9* 8.0*  MG  --   --   --  3.5* 3.5*  PHOS  --   --   --  3.1 3.4   GFR: Estimated Creatinine Clearance: 45.5 mL/min (by C-G formula based on SCr of 0.98 mg/dL). Liver Function Tests: No results for input(s): AST, ALT, ALKPHOS, BILITOT, PROT, ALBUMIN in the last 168 hours. No results for input(s): LIPASE, AMYLASE in the last 168 hours. No results for input(s): AMMONIA in the last 168 hours. Coagulation Profile: No results for input(s): INR, PROTIME in the last 168 hours. Cardiac Enzymes: No results for input(s): CKTOTAL, CKMB, CKMBINDEX, TROPONINI in the last 168 hours. BNP (last 3 results) No results for input(s): PROBNP in the last 8760 hours.  HbA1C: No results for input(s): HGBA1C in the last 72 hours. CBG: No results for input(s): GLUCAP in the last 168 hours. Lipid Profile: No results for input(s): CHOL, HDL, LDLCALC, TRIG, CHOLHDL, LDLDIRECT in the last 72 hours. Thyroid Function Tests: No results for input(s): TSH, T4TOTAL, FREET4, T3FREE, THYROIDAB in the last 72 hours. Anemia Panel: No results for input(s): VITAMINB12, FOLATE, FERRITIN, TIBC, IRON, RETICCTPCT in the last 72 hours. Sepsis Labs: No results for input(s): PROCALCITON, LATICACIDVEN in the last 168 hours.  Recent Results (from the past 240 hour(s))  Surgical pcr screen      Status: Abnormal   Collection Time: 05/10/16  5:34 PM  Result Value Ref Range Status   MRSA, PCR NEGATIVE NEGATIVE Final   Staphylococcus aureus POSITIVE (A) NEGATIVE Final    Comment:        The Xpert SA Assay (FDA approved for NASAL specimens in patients over 57 years of age), is one component of a comprehensive surveillance program.  Test performance has been validated by [facility] for patients greater than or equal to 34 year old. It is not intended to diagnose infection nor to guide or monitor treatment.   Culture, Urine     Status: None   Collection Time: 05/15/16 10:22 AM  Result Value Ref Range Status   Specimen Description URINE, RANDOM  Final   Special Requests ADDED [id number]  Final   Culture NO GROWTH  Final   Report Status 05/17/2016 FINAL  Final  MRSA PCR Screening     Status: None   Collection Time: 05/17/16  7:52 PM  Result Value Ref Range Status   MRSA by PCR NEGATIVE NEGATIVE Final    Comment:        The GeneXpert MRSA Assay (FDA approved for NASAL specimens only), is one component of a comprehensive MRSA colonization surveillance program. It is not intended to diagnose MRSA infection nor to guide or monitor treatment for MRSA infections.          Radiology Studies: Dg Chest Port 1 View  Result Date: 05/18/2016 CLINICAL DATA:  Shortness of breath.  Acute respiratory failure. EXAM: PORTABLE CHEST 1 VIEW COMPARISON:  05/17/2016 and 05/10/2016 and CT scan of the chest 02/07/2016 FINDINGS: Heart size and pulmonary vascularity are normal. Scarring and a masslike density in the right upper lung zone and right hilar region, unchanged. Left lung is clear. No infiltrates or effusions. IMPRESSION: Stable appearance of the chest since the prior study. Probable scarring in the right upper lung zone and right perihilar region as described. Electronically Signed   By: Lorriane Shire M.D.   On: 05/18/2016 09:24   Dg Chest Port 1 View  Result Date:  05/17/2016 CLINICAL DATA:  Short of breath EXAM: PORTABLE CHEST 1 VIEW COMPARISON:  05/14/2016 FINDINGS: The heart is normal in size. Persistent volume loss in the right lung is stable. Hazy opacity towards the right apex is unchanged. There is linear scarring in the right upper lung. Left lung is clear. No pneumothorax or pleural effusion. IMPRESSION: Stable changes related of malignancy and volume loss in the right lung. Electronically Signed   By: Marybelle Killings M.D.   On: 05/17/2016 14:30   Dg Abd Portable 1v  Result Date: 05/18/2016 CLINICAL DATA:  Fecal impaction. EXAM: PORTABLE ABDOMEN - 1 VIEW COMPARISON:  05/16/2016 FINDINGS: There is fluid and gas slightly distended ascending and transverse portions of the colon. No visible fecal impaction. No dilated small bowel. Angulated comminuted intertrochanteric fracture of  the proximal left femur is noted. The angulation is new since the prior study of 05/10/2016. IMPRESSION: Slightly distended transverse portion of the colon. No visible fecal impaction. New angulation of the proximal left femur fracture. Electronically Signed   By: Lorriane Shire M.D.   On: 05/18/2016 09:26        Scheduled Meds: . arformoterol  15 mcg Nebulization BID  . aspirin EC  81 mg Oral Daily  . budesonide (PULMICORT) nebulizer solution  0.5 mg Nebulization BID  . [START ON 05/19/2016]  ceFAZolin (ANCEF) IV  2 g Intravenous To SS-Surg  . gabapentin  300 mg Oral BID  . guaiFENesin  1,200 mg Oral BID  . ipratropium-albuterol  3 mL Nebulization Q6H WA  . levothyroxine  25 mcg Oral QAC breakfast  . magic mouthwash  5 mL Oral QID  . methylPREDNISolone (SOLU-MEDROL) injection  80 mg Intravenous Q8H  . pantoprazole  40 mg Oral Daily  . polyethylene glycol  17 g Oral BID  . senna-docusate  1 tablet Oral BID  . simvastatin  20 mg Oral Daily   Continuous Infusions:    LOS: 8 days     Charmaine Placido, MD, FACP, FHM. [facility] Pager [phone]  If 7PM-7AM,  please contact night-coverage [email or web address] Password [email or web address] 05/18/2016, 3:08 PM

## 2016-05-18 NOTE — Progress Notes (Signed)
Pt and family are agreeable to hip surgery. Dr. Dellia Nims and Dr. Erlinda Hong notified.

## 2016-05-18 NOTE — Progress Notes (Signed)
Orthopedic Tech Progress Note Patient Details:  Janice Daniels August 14, 1945 [id number]  Ortho Devices Type of Ortho Device: Postop shoe/boot Ortho Device/Splint Location: Bilateral Prafo boots Ortho Device/Splint Interventions: Application   [place] Pink 05/18/2016, 9:21 AM

## 2016-05-18 NOTE — Progress Notes (Signed)
Patient has had 3 loose bowel movements, brown in color. Not foul-smelling. Transferred on 11/22 from 6N. RN reported that the patient was given magnesium citrate, miralax, senna-docusate, dulcolax suppository for constipation. Had one bowel movement prior to transfer to 3W. Abdomen mildly distended but patient denies abdominal pain. Hypoactive bowel sounds in all 4 quadrants. Will continue to monitor.

## 2016-05-18 NOTE — Progress Notes (Signed)
Ortho tech into room to place Prafo boots bilaterally. Heels repositioned on pillow. Pt states pain is now 0/10.

## 2016-05-18 NOTE — Progress Notes (Signed)
Name: Janice Daniels MRN: [id number] DOB: Nov 19, 1945    ADMISSION DATE:  05/10/2016 CONSULTATION DATE:  11/16 REFERRING MD :  Dr. Lonny Prude TRH  CHIEF COMPLAINT:  Shortness of Breath  HISTORY OF PRESENT ILLNESS:  70 y/o pt with PMH significant for COPD and Stage III [place] lung cancer. She presents 11/15 with persistent shortness of breath and left hip pain after a fall.  She states about two weeks prior she began have fevers and productive cough.  She talked with PCP over the phone, but nothing was prescribed.  Symptoms improved over the past week, however she is still having SOB.  11/15 she fell at home and presented to ED with hip pain, where she was found to have L hip fracture. She was admitted in this setting also with COPD exacerbation. She was treated with bronchodilators, steroids, and antibiotics with plans for OR on 11/16, however just prior to presenting to OR was found to have increased work of breathing. Surgery postponed and pulmonary consulted.   SUBJECTIVE:  Cough non-productive, bowels better. Wants more pain control. Not SOB lying in bed.  VITAL SIGNS: Temp:  [97.5 F (36.4 C)-97.9 F (36.6 C)] 97.9 F (36.6 C) (11/23 0500) Pulse Rate:  [96-107] 98 (11/23 0848) Resp:  [14-31] 19 (11/23 0848) BP: (132-153)/(87-95) 153/95 (11/23 0500) SpO2:  [86 %-96 %] 96 % (11/23 0848) FiO2 (%):  [3 %] 3 % (11/23 0500) Weight:  [57.2 kg (126 lb)-59.2 kg (130 lb 9.6 oz)] 59.2 kg (130 lb 9.6 oz) (11/23 0500)  PHYSICAL EXAMINATION: General:  Ill appearing elderly female aroused easily to voice. Conversant short, unlabored sentences. Neuro:  AAO, generalized weakness.  HEENT:  MM pink/moist,jvd 1 cm Cardiovascular:  s1 s2 Normal rhythm no MRG Lungs: coarse unlabored breath sounds with no cough while I was there. Abdomen:  Soft non tender non distended, quiet Musculoskeletal: Muscle bulk normal for age/ gender.  Skin:  Pale/warm, grossly intact   Recent Labs Lab 05/16/16 0306  05/17/16 0520 05/18/16 0338  NA 132* 134* 137  K 4.2 4.5 4.3  CL 87* 85* 87*  CO2 36* 39* 40*  BUN 21* 21* 24*  CREATININE 0.97 0.91 0.98  GLUCOSE 140* 123* 161*    Recent Labs Lab 05/16/16 1243 05/18/16 0338  HGB 11.1* 12.2  HCT 33.3* 37.3  WBC 15.4* 15.4*  PLT 247 277   Dg Chest Port 1 View  Result Date: 05/18/2016 CLINICAL DATA:  Shortness of breath.  Acute respiratory failure. EXAM: PORTABLE CHEST 1 VIEW COMPARISON:  05/17/2016 and 05/10/2016 and CT scan of the chest 02/07/2016 FINDINGS: Heart size and pulmonary vascularity are normal. Scarring and a masslike density in the right upper lung zone and right hilar region, unchanged. Left lung is clear. No infiltrates or effusions. IMPRESSION: Stable appearance of the chest since the prior study. Probable scarring in the right upper lung zone and right perihilar region as described. Electronically Signed   By: Lorriane Shire M.D.   On: 05/18/2016 09:24   Dg Chest Port 1 View  Result Date: 05/17/2016 CLINICAL DATA:  Short of breath EXAM: PORTABLE CHEST 1 VIEW COMPARISON:  05/14/2016 FINDINGS: The heart is normal in size. Persistent volume loss in the right lung is stable. Hazy opacity towards the right apex is unchanged. There is linear scarring in the right upper lung. Left lung is clear. No pneumothorax or pleural effusion. IMPRESSION: Stable changes related of malignancy and volume loss in the right lung. Electronically Signed   By:  Marybelle Killings M.D.   On: 05/17/2016 14:30   Dg Abd Portable 1v  Result Date: 05/18/2016 CLINICAL DATA:  Fecal impaction. EXAM: PORTABLE ABDOMEN - 1 VIEW COMPARISON:  05/16/2016 FINDINGS: There is fluid and gas slightly distended ascending and transverse portions of the colon. No visible fecal impaction. No dilated small bowel. Angulated comminuted intertrochanteric fracture of the proximal left femur is noted. The angulation is new since the prior study of 05/10/2016. IMPRESSION: Slightly distended  transverse portion of the colon. No visible fecal impaction. New angulation of the proximal left femur fracture. Electronically Signed   By: Lorriane Shire M.D.   On: 05/18/2016 09:26   Dg Abd Portable 1v  Result Date: 05/16/2016 CLINICAL DATA:  Abdominal distention.  Epigastric pain. EXAM: PORTABLE ABDOMEN - 1 VIEW COMPARISON:  Two days ago FINDINGS: Decreased stool retention and diminished gaseous distention of colon. No small bowel obstruction pattern. No concerning mass effect or gas collection. EKG leads create artifact over the abdomen. Known proximal left femur fracture.  Osteopenia. IMPRESSION: Stool retention and gaseous colon distention are improved from 2 days prior. Electronically Signed   By: Monte Fantasia M.D.   On: 05/16/2016 11:17    ASSESSMENT / PLAN:  70 yo woman with severe COPD, hx NSCLCA, followed by Dr Lake Bells in our office. She has a L hip fx from mechanical fall, will need surgical repair. Is being treated for AE-COPD trying to optimize respiratory status prior to OR. 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 S/P chemo and radiotherapy in 2014 Left hip fracture  ABG from 11/22 indicates partially compensated metabolic alkalosis with elevated bicarb and PCO2. Reflects diuresis. I reviewed CXR 11/23 and agree. She has bronchitis/ retained secretion issues. Will try to improve pulmonary toilet. Suctioning at time of intubation for GOT may actually help.   Plan: - Not much change evident - Duoneb incr to every 6 scheduled -lasix maintain  -monitor status of constipation- abd distension burdens lung function -pending above response and evaluation, may need to go to [place], MD PCCM P  [phone] [place]

## 2016-05-18 NOTE — Progress Notes (Signed)
RT called for patient sats in mid 80's. Instructed to increase oxygen until arrival. Upon arrival, patient sats 87%, increased oxygen to 5Lpm and gave breathing treatments. Sats up to 93%. Patient complaining of extreme pain. RN aware.

## 2016-05-18 NOTE — Plan of Care (Signed)
Problem: Pain Managment: Goal: General experience of comfort will improve Outcome: Progressing Assess pain control routine and prn.   Problem: Physical Regulation: Goal: Ability to maintain clinical measurements within normal limits will improve Outcome: Progressing Assess

## 2016-05-18 NOTE — Progress Notes (Signed)
Discussed with patient and family that decision needs to be made regarding surgery or not.  Reviewed the r/b/a to surgical vs nonsurgical treatment.  They will discuss amongst themselves and let us know.  I have spoken to pulmonology and she is as optimized as she can be.  We will plan for surgery if family and patient wish to proceed.  Azucena Cecil, MD [place] 10:57 AM

## 2016-05-18 NOTE — Progress Notes (Addendum)
Noticed hematuria in urinary catheter bag. Patient has foley catheter in place. Paged on call NP Baltazar Najjar. Received order to obtain a urinalysis. Sent to lab. Awaiting results. Will continue to monitor

## 2016-05-19 ENCOUNTER — Encounter (HOSPITAL_COMMUNITY): Payer: Self-pay | Admitting: Certified Registered Nurse Anesthetist

## 2016-05-19 ENCOUNTER — Inpatient Hospital Stay (HOSPITAL_COMMUNITY): Payer: PPO | Admitting: Anesthesiology

## 2016-05-19 ENCOUNTER — Encounter (HOSPITAL_COMMUNITY)
Admission: EM | Disposition: A | Discharge status: Skilled Nursing Facility | Payer: Self-pay | Source: Home / Self Care | Attending: Family Medicine

## 2016-05-19 ENCOUNTER — Inpatient Hospital Stay (HOSPITAL_COMMUNITY): Payer: PPO

## 2016-05-19 DIAGNOSIS — I5023 Acute on chronic systolic (congestive) heart failure: Secondary | ICD-10-CM | POA: Diagnosis not present

## 2016-05-19 DIAGNOSIS — B370 Candidal stomatitis: Secondary | ICD-10-CM | POA: Diagnosis not present

## 2016-05-19 DIAGNOSIS — S72002D Fracture of unspecified part of neck of left femur, subsequent encounter for closed fracture with routine healing: Secondary | ICD-10-CM

## 2016-05-19 DIAGNOSIS — E871 Hypo-osmolality and hyponatremia: Secondary | ICD-10-CM | POA: Diagnosis not present

## 2016-05-19 DIAGNOSIS — I5022 Chronic systolic (congestive) heart failure: Secondary | ICD-10-CM | POA: Diagnosis not present

## 2016-05-19 DIAGNOSIS — R0600 Dyspnea, unspecified: Secondary | ICD-10-CM | POA: Diagnosis not present

## 2016-05-19 DIAGNOSIS — G40909 Epilepsy, unspecified, not intractable, without status epilepticus: Secondary | ICD-10-CM | POA: Diagnosis not present

## 2016-05-19 DIAGNOSIS — D62 Acute posthemorrhagic anemia: Secondary | ICD-10-CM | POA: Diagnosis not present

## 2016-05-19 DIAGNOSIS — J449 Chronic obstructive pulmonary disease, unspecified: Secondary | ICD-10-CM | POA: Diagnosis not present

## 2016-05-19 DIAGNOSIS — I110 Hypertensive heart disease with heart failure: Secondary | ICD-10-CM | POA: Diagnosis not present

## 2016-05-19 DIAGNOSIS — J441 Chronic obstructive pulmonary disease with (acute) exacerbation: Secondary | ICD-10-CM | POA: Diagnosis not present

## 2016-05-19 DIAGNOSIS — R0603 Acute respiratory distress: Secondary | ICD-10-CM

## 2016-05-19 DIAGNOSIS — Z9221 Personal history of antineoplastic chemotherapy: Secondary | ICD-10-CM | POA: Diagnosis not present

## 2016-05-19 DIAGNOSIS — I509 Heart failure, unspecified: Secondary | ICD-10-CM | POA: Diagnosis not present

## 2016-05-19 DIAGNOSIS — N179 Acute kidney failure, unspecified: Secondary | ICD-10-CM | POA: Diagnosis not present

## 2016-05-19 DIAGNOSIS — K5900 Constipation, unspecified: Secondary | ICD-10-CM | POA: Diagnosis not present

## 2016-05-19 DIAGNOSIS — I429 Cardiomyopathy, unspecified: Secondary | ICD-10-CM | POA: Diagnosis not present

## 2016-05-19 DIAGNOSIS — J9621 Acute and chronic respiratory failure with hypoxia: Secondary | ICD-10-CM | POA: Diagnosis not present

## 2016-05-19 DIAGNOSIS — S7222XA Displaced subtrochanteric fracture of left femur, initial encounter for closed fracture: Secondary | ICD-10-CM | POA: Diagnosis not present

## 2016-05-19 DIAGNOSIS — S72142A Displaced intertrochanteric fracture of left femur, initial encounter for closed fracture: Secondary | ICD-10-CM | POA: Diagnosis not present

## 2016-05-19 HISTORY — PX: INTRAMEDULLARY (IM) NAIL INTERTROCHANTERIC: SHX5875

## 2016-05-19 LAB — ECHOCARDIOGRAM COMPLETE
Height: 64 in
WEIGHTICAEL: 2041.6 [oz_av]

## 2016-05-19 LAB — CBC
HCT: 35.6 % — ABNORMAL LOW (ref 36.0–46.0)
Hemoglobin: 11.8 g/dL — ABNORMAL LOW (ref 12.0–15.0)
MCH: 31.4 pg (ref 26.0–34.0)
MCHC: 33.1 g/dL (ref 30.0–36.0)
MCV: 94.7 fL (ref 78.0–100.0)
PLATELETS: 264 10*3/uL (ref 150–400)
RBC: 3.76 MIL/uL — AB (ref 3.87–5.11)
RDW: 14.1 % (ref 11.5–15.5)
WBC: 17.7 10*3/uL — AB (ref 4.0–10.5)

## 2016-05-19 LAB — GLUCOSE, CAPILLARY: Glucose-Capillary: 176 mg/dL — ABNORMAL HIGH (ref 65–99)

## 2016-05-19 LAB — CREATININE, SERUM
CREATININE: 1.03 mg/dL — AB (ref 0.44–1.00)
GFR calc non Af Amer: 53 mL/min — ABNORMAL LOW (ref >60)

## 2016-05-19 SURGERY — FIXATION, FRACTURE, INTERTROCHANTERIC, WITH INTRAMEDULLARY ROD
Anesthesia: Spinal | Laterality: Left

## 2016-05-19 MED ORDER — ENOXAPARIN SODIUM 40 MG/0.4ML ~~LOC~~ SOLN: 40.0000 mg | Freq: Every day | SUBCUTANEOUS | 0 refills | Status: DC

## 2016-05-19 MED ORDER — METOCLOPRAMIDE HCL 5 MG/ML IJ SOLN: 5.0000 mg | Freq: Three times a day (TID) | INTRAMUSCULAR | Status: DC | PRN

## 2016-05-19 MED ORDER — METHOCARBAMOL 500 MG PO TABS
500.0000 mg | ORAL_TABLET | Freq: Four times a day (QID) | ORAL | Status: DC | PRN
Start: 2016-05-19 — End: 2016-05-25
  Administered 2016-05-20 – 2016-05-22 (×4): 500 mg via ORAL
  Filled 2016-05-19 (×4): qty 1

## 2016-05-19 MED ORDER — ALUM & MAG HYDROXIDE-SIMETH 200-200-20 MG/5ML PO SUSP: 30.0000 mL | ORAL | Status: DC | PRN

## 2016-05-19 MED ORDER — CEFAZOLIN SODIUM-DEXTROSE 2-3 GM-% IV SOLR
INTRAVENOUS | Status: DC | PRN
  Administered 2016-05-19: 2 g via INTRAVENOUS

## 2016-05-19 MED ORDER — OXYCODONE HCL 5 MG PO TABS
5.0000 mg | ORAL_TABLET | ORAL | Status: DC | PRN
  Administered 2016-05-19 – 2016-05-20 (×3): 5 mg via ORAL
  Filled 2016-05-19 (×3): qty 1

## 2016-05-19 MED ORDER — HYDROMORPHONE HCL 1 MG/ML IJ SOLN: 0.2500 mg | INTRAMUSCULAR | Status: DC | PRN

## 2016-05-19 MED ORDER — ACETAMINOPHEN 650 MG RE SUPP: 650.0000 mg | Freq: Four times a day (QID) | RECTAL | Status: DC | PRN

## 2016-05-19 MED ORDER — METHOCARBAMOL 1000 MG/10ML IJ SOLN
500.0000 mg | Freq: Four times a day (QID) | INTRAVENOUS | Status: DC | PRN
  Filled 2016-05-19: qty 5

## 2016-05-19 MED ORDER — PROMETHAZINE HCL 25 MG/ML IJ SOLN: 6.2500 mg | INTRAMUSCULAR | Status: DC | PRN

## 2016-05-19 MED ORDER — ONDANSETRON HCL 4 MG PO TABS: 4.0000 mg | ORAL_TABLET | Freq: Four times a day (QID) | ORAL | Status: DC | PRN

## 2016-05-19 MED ORDER — PHENYLEPHRINE HCL 10 MG/ML IJ SOLN
INTRAMUSCULAR | Status: DC | PRN
  Administered 2016-05-19: 40 ug via INTRAVENOUS

## 2016-05-19 MED ORDER — 0.9 % SODIUM CHLORIDE (POUR BTL) OPTIME
TOPICAL | Status: DC | PRN
  Administered 2016-05-19: 1000 mL

## 2016-05-19 MED ORDER — BUPIVACAINE IN DEXTROSE 0.75-8.25 % IT SOLN
INTRATHECAL | Status: DC | PRN
  Administered 2016-05-19: 1.6 mL via INTRATHECAL

## 2016-05-19 MED ORDER — KETAMINE HCL-SODIUM CHLORIDE 100-0.9 MG/10ML-% IV SOSY
PREFILLED_SYRINGE | INTRAVENOUS | Status: AC
  Filled 2016-05-19: qty 10

## 2016-05-19 MED ORDER — CEFAZOLIN SODIUM-DEXTROSE 2-4 GM/100ML-% IV SOLN
2.0000 g | Freq: Four times a day (QID) | INTRAVENOUS | Status: AC
  Administered 2016-05-19 – 2016-05-20 (×3): 2 g via INTRAVENOUS
  Filled 2016-05-19 (×3): qty 100

## 2016-05-19 MED ORDER — METOCLOPRAMIDE HCL 5 MG PO TABS: 5.0000 mg | ORAL_TABLET | Freq: Three times a day (TID) | ORAL | Status: DC | PRN

## 2016-05-19 MED ORDER — PROPOFOL 1000 MG/100ML IV EMUL
INTRAVENOUS | Status: AC
Start: 2016-05-19 — End: 2016-05-19
  Filled 2016-05-19: qty 100

## 2016-05-19 MED ORDER — MIDAZOLAM HCL 5 MG/5ML IJ SOLN
INTRAMUSCULAR | Status: DC | PRN
  Administered 2016-05-19 (×2): .5 mg via INTRAVENOUS

## 2016-05-19 MED ORDER — OXYCODONE-ACETAMINOPHEN 5-325 MG PO TABS: 1.0000 | ORAL_TABLET | ORAL | 0 refills | Status: AC | PRN

## 2016-05-19 MED ORDER — ACETAMINOPHEN 325 MG PO TABS: 650.0000 mg | ORAL_TABLET | Freq: Four times a day (QID) | ORAL | Status: DC | PRN

## 2016-05-19 MED ORDER — MORPHINE SULFATE (PF) 2 MG/ML IV SOLN
0.5000 mg | INTRAVENOUS | Status: DC | PRN
Start: 2016-05-19 — End: 2016-05-25
  Administered 2016-05-21: 0.5 mg via INTRAVENOUS
  Filled 2016-05-19: qty 1

## 2016-05-19 MED ORDER — KETAMINE HCL 10 MG/ML IJ SOLN
INTRAMUSCULAR | Status: DC | PRN
  Administered 2016-05-19: 20 mg via INTRAVENOUS
  Administered 2016-05-19 (×3): 10 mg via INTRAVENOUS

## 2016-05-19 MED ORDER — LACTATED RINGERS IV SOLN
INTRAVENOUS | Status: DC
  Administered 2016-05-19 (×2): via INTRAVENOUS

## 2016-05-19 MED ORDER — PROPOFOL 500 MG/50ML IV EMUL
INTRAVENOUS | Status: DC | PRN
  Administered 2016-05-19: 30 ug/kg/min via INTRAVENOUS

## 2016-05-19 MED ORDER — PHENYLEPHRINE HCL 10 MG/ML IJ SOLN
INTRAVENOUS | Status: DC | PRN
  Administered 2016-05-19: 20 ug/min via INTRAVENOUS

## 2016-05-19 MED ORDER — HYDROCODONE-ACETAMINOPHEN 5-325 MG PO TABS
1.0000 | ORAL_TABLET | Freq: Four times a day (QID) | ORAL | Status: DC | PRN
  Administered 2016-05-19: 1 via ORAL
  Administered 2016-05-20: 2 via ORAL
  Administered 2016-05-20 – 2016-05-25 (×9): 1 via ORAL
  Filled 2016-05-19 (×12): qty 1

## 2016-05-19 MED ORDER — MENTHOL 3 MG MT LOZG
1.0000 | LOZENGE | OROMUCOSAL | Status: DC | PRN
  Administered 2016-05-23 – 2016-05-25 (×2): 3 mg via ORAL
  Filled 2016-05-19: qty 9

## 2016-05-19 MED ORDER — MIDAZOLAM HCL 2 MG/2ML IJ SOLN
INTRAMUSCULAR | Status: AC
  Filled 2016-05-19: qty 2

## 2016-05-19 MED ORDER — ALBUTEROL SULFATE (2.5 MG/3ML) 0.083% IN NEBU
INHALATION_SOLUTION | RESPIRATORY_TRACT | Status: AC
  Filled 2016-05-19: qty 3

## 2016-05-19 MED ORDER — PHENOL 1.4 % MT LIQD
1.0000 | OROMUCOSAL | Status: DC | PRN
  Administered 2016-05-23: 1 via OROMUCOSAL

## 2016-05-19 MED ORDER — ONDANSETRON HCL 4 MG/2ML IJ SOLN: 4.0000 mg | Freq: Four times a day (QID) | INTRAMUSCULAR | Status: DC | PRN

## 2016-05-19 MED ORDER — SODIUM CHLORIDE 0.9 % IV SOLN: INTRAVENOUS | Status: DC

## 2016-05-19 MED ORDER — ENOXAPARIN SODIUM 40 MG/0.4ML ~~LOC~~ SOLN
40.0000 mg | SUBCUTANEOUS | Status: DC
  Administered 2016-05-20 – 2016-05-21 (×2): 40 mg via SUBCUTANEOUS
  Filled 2016-05-19 (×2): qty 0.4

## 2016-05-19 SURGICAL SUPPLY — 46 items
BIT DRILL SHORT 4.0 (BIT) ×1 IMPLANT
BLADE SURG 15 STRL LF DISP TIS (BLADE) ×1 IMPLANT
BLADE SURG 15 STRL SS (BLADE) ×1
BNDG COHESIVE 4X5 TAN NS LF (GAUZE/BANDAGES/DRESSINGS) ×2 IMPLANT
BNDG COHESIVE 6X5 TAN STRL LF (GAUZE/BANDAGES/DRESSINGS) IMPLANT
BNDG GAUZE ELAST 4 BULKY (GAUZE/BANDAGES/DRESSINGS) ×2 IMPLANT
COVER PERINEAL POST (MISCELLANEOUS) ×2 IMPLANT
COVER SURGICAL LIGHT HANDLE (MISCELLANEOUS) ×2 IMPLANT
DRAPE PROXIMA HALF (DRAPES) IMPLANT
DRAPE STERI IOBAN 125X83 (DRAPES) ×2 IMPLANT
DRILL BIT SHORT 4.0 (BIT) ×1
DRSG MEPILEX BORDER 4X4 (GAUZE/BANDAGES/DRESSINGS) ×2 IMPLANT
DRSG MEPILEX BORDER 4X8 (GAUZE/BANDAGES/DRESSINGS) ×2 IMPLANT
DRSG PAD ABDOMINAL 8X10 ST (GAUZE/BANDAGES/DRESSINGS) ×4 IMPLANT
DURAPREP 26ML APPLICATOR (WOUND CARE) ×2 IMPLANT
ELECT CAUTERY BLADE 6.4 (BLADE) ×2 IMPLANT
ELECT REM PT RETURN 9FT ADLT (ELECTROSURGICAL) ×2
ELECTRODE REM PT RTRN 9FT ADLT (ELECTROSURGICAL) ×1 IMPLANT
FACESHIELD WRAPAROUND (MASK) ×2 IMPLANT
GAUZE XEROFORM 5X9 LF (GAUZE/BANDAGES/DRESSINGS) ×2 IMPLANT
GLOVE SKINSENSE NS SZ7.5 (GLOVE) ×2
GLOVE SKINSENSE STRL SZ7.5 (GLOVE) ×2 IMPLANT
GOWN STRL REIN XL XLG (GOWN DISPOSABLE) ×2 IMPLANT
GUIDE PIN 3.2X343 (PIN) ×1
GUIDE PIN 3.2X343MM (PIN) ×1
KIT BASIN OR (CUSTOM PROCEDURE TRAY) ×2 IMPLANT
KIT ROOM TURNOVER OR (KITS) ×2 IMPLANT
LINER BOOT UNIVERSAL DISP (MISCELLANEOUS) ×2 IMPLANT
MANIFOLD NEPTUNE II (INSTRUMENTS) ×2 IMPLANT
NAIL LEFT 10X36 (Nail) ×2 IMPLANT
NS IRRIG 1000ML POUR BTL (IV SOLUTION) ×2 IMPLANT
PACK GENERAL/GYN (CUSTOM PROCEDURE TRAY) ×2 IMPLANT
PAD ARMBOARD 7.5X6 YLW CONV (MISCELLANEOUS) ×4 IMPLANT
PAD CAST 4YDX4 CTTN HI CHSV (CAST SUPPLIES) ×2 IMPLANT
PADDING CAST COTTON 4X4 STRL (CAST SUPPLIES) ×2
PIN GUIDE 3.2X343MM (PIN) ×1 IMPLANT
SCREW LAG COMPR KIT 90/85 (Screw) ×2 IMPLANT
SCREW TRIGEN LOW PROF 5.0X42.5 (Screw) ×2 IMPLANT
STAPLER VISISTAT 35W (STAPLE) ×2 IMPLANT
SUT VIC AB 0 CT1 27 (SUTURE) ×2
SUT VIC AB 0 CT1 27XBRD ANBCTR (SUTURE) ×2 IMPLANT
SUT VIC AB 2-0 CT1 27 (SUTURE) ×2
SUT VIC AB 2-0 CT1 TAPERPNT 27 (SUTURE) ×2 IMPLANT
TOWEL OR 17X24 6PK STRL BLUE (TOWEL DISPOSABLE) ×2 IMPLANT
TOWEL OR 17X26 10 PK STRL BLUE (TOWEL DISPOSABLE) ×2 IMPLANT
WATER STERILE IRR 1000ML POUR (IV SOLUTION) ×2 IMPLANT

## 2016-05-19 NOTE — Transfer of Care (Addendum)
Immediate Anesthesia Transfer of Care Note  Patient: Janice Daniels  Procedure(s) Performed: Procedure(s): INTRAMEDULLARY (IM) NAIL LEFT INTERTROCHANTERIC HIP (Left)  Patient Location: PACU  Anesthesia Type:MAC/Spinal   Level of Consciousness: sedated and patient cooperative  Airway & Oxygen Therapy: Patient Spontanous Breathing and Patient connected to nasal cannula oxygen  Post-op Assessment: Report given to RN and Post -op Vital signs reviewed and stable  Post vital signs: Reviewed and stable  Last Vitals:  Vitals:   05/19/16 0647 05/19/16 1045  BP: (!) 153/95 106/80  Pulse: 98 92  Resp: (!) 24 20  Temp: 36.7 C 36.3 C    Last Pain:  Vitals:   05/19/16 0647  TempSrc: Oral  PainSc:       Patients Stated Pain Goal: 0 (61/22/44 9753)  Complications: No apparent anesthesia complications

## 2016-05-19 NOTE — Anesthesia Postprocedure Evaluation (Signed)
Anesthesia Post Note  Patient: Janice Daniels  Procedure(s) Performed: Procedure(s) (LRB): INTRAMEDULLARY (IM) NAIL LEFT INTERTROCHANTERIC HIP (Left)  Patient location during evaluation: PACU Anesthesia Type: Spinal Level of consciousness: oriented and awake and alert Pain management: pain level controlled Vital Signs Assessment: post-procedure vital signs reviewed and stable Respiratory status: spontaneous breathing, respiratory function stable and patient connected to nasal cannula oxygen Cardiovascular status: blood pressure returned to baseline and stable Postop Assessment: no headache and no backache Anesthetic complications: no    Last Vitals:  Vitals:   05/19/16 1100 05/19/16 1115  BP: 126/80 119/90  Pulse: 99 98  Resp: (!) 22 19  Temp:      Last Pain:  Vitals:   05/19/16 1115  TempSrc:   PainSc: 0-No pain    LLE Motor Response: Purposeful movement;Responds to commands (wiggles toes) (05/19/16 1115) LLE Sensation: Decreased (05/19/16 1115) RLE Motor Response: Purposeful movement;Responds to commands (wiggles toes) (05/19/16 1115) RLE Sensation: Decreased (05/19/16 1115) L Sensory Level: S1-Sole of foot, small toes (05/19/16 1115) R Sensory Level: S1-Sole of foot, small toes (05/19/16 1115)  Josafat Enrico S

## 2016-05-19 NOTE — Anesthesia Procedure Notes (Signed)
Spinal  Patient location during procedure: OR Staffing Anesthesiologist: Bridney Guadarrama, [place] Performed: anesthesiologist  Preanesthetic Checklist Completed: patient identified, site marked, surgical consent, pre-op evaluation, timeout performed, IV checked, risks and benefits discussed and monitors and equipment checked Spinal Block Patient position: right lateral decubitus Prep: Betadine Patient monitoring: heart rate, continuous pulse ox and blood pressure Location: L3-4 Injection technique: single-shot Needle Needle type: Spinocan  Needle gauge: 22 G Needle length: 9 cm Additional Notes Expiration date of kit checked and confirmed. Patient tolerated procedure well, without complications.

## 2016-05-19 NOTE — Op Note (Signed)
   Date of Surgery: 05/19/2016  INDICATIONS: Ms. Vanstone is a 70 y.o.-year-old female who sustained a left hip fracture. The risks and benefits of the procedure discussed with the patient prior to the procedure and all questions were answered; consent was obtained.  PREOPERATIVE DIAGNOSIS: left subtrochanteric hip fracture   POSTOPERATIVE DIAGNOSIS: Same   PROCEDURE: Treatment of subtrochanteric fracture with intramedullary implant. CPT [phone]   SURGEON: N. Eduard Roux, M.D.   ANESTHESIA: general   IV FLUIDS AND URINE: See anesthesia record   ESTIMATED BLOOD LOSS: 200 cc  IMPLANTS: [employer] InterTAN 10 x 36, 90/85  DRAINS: None.   COMPLICATIONS: None.   DESCRIPTION OF PROCEDURE: The patient was brought to the operating room and placed supine on the operating table. The patient's leg had been signed prior to the procedure. The patient had the anesthesia placed by the anesthesiologist. The prep verification and incision time-outs were performed to confirm that this was the correct patient, site, side and location. The patient had an SCD on the opposite lower extremity. The patient did receive antibiotics prior to the incision and was re-dosed during the procedure as needed at indicated intervals. The patient was positioned on the fracture table with the table in traction and internal rotation to reduce the hip. The well leg was placed in a scissor position and all bony prominences were well-padded. The patient had the lower extremity prepped and draped in the standard surgical fashion. The incision was made 4 finger breadths superior to the greater trochanter. A guide pin was inserted into the tip of the greater trochanter under fluoroscopic guidance. An opening reamer was used to gain access to the femoral canal. The nail length was measured and inserted down the femoral canal to its proper depth. The appropriate version of insertion for the lag screw was found under fluoroscopy. A  pin was inserted up the femoral neck through the jig. Then, a second antirotation pin was inserted inferior to the first pin. The length of the lag screw was then measured. The lag screw was inserted as near to center-center in the head as possible. The antirotation pin was then taken out and an interdigitating compression screw was placed in its place. Then a static distal interlocking screw was placed using perfect circle technique.  The wound was copiously irrigated with saline and the subcutaneous layer closed with 2.0 vicryl and the skin was reapproximated with staples. The wounds were cleaned and dried a final time and a sterile dressing was placed. The hip was taken through a range of motion at the end of the case under fluoroscopic imaging to visualize the approach-withdraw phenomenon and confirm implant length in the head. The patient was then awakened from anesthesia and taken to the recovery room in stable condition. All counts were correct at the end of the case.   POSTOPERATIVE PLAN: The patient will be weight bearing as tolerated and will return in 2 weeks for staple removal and the patient will receive DVT prophylaxis based on other medications, activity level, and risk ratio of bleeding to thrombosis.   Azucena Cecil, MD [place] 10:22 AM

## 2016-05-19 NOTE — Progress Notes (Signed)
Name: Janice Daniels MRN: [id number] DOB: [birth year]    ADMISSION DATE:  05/10/2016 CONSULTATION DATE:  11/16 REFERRING MD :  Dr. Lonny Prude [facility]  CHIEF COMPLAINT:  Shortness of Breath  HISTORY OF PRESENT ILLNESS:  70 y/o pt with PMH significant for COPD and Stage III [place] lung cancer. She presents 11/15 with persistent shortness of breath and left hip pain after a fall.  She states about two weeks prior she began have fevers and productive cough.  She talked with PCP over the phone, but nothing was prescribed.  Symptoms improved over the past week, however she is still having SOB.  11/15 she fell at home and presented to ED with hip pain, where she was found to have L hip fracture. She was admitted in this setting also with COPD exacerbation. She was treated with bronchodilators, steroids, and antibiotics with plans for OR on 11/16, however just prior to presenting to OR was found to have increased work of breathing. Surgery postponed and pulmonary consulted.   SUBJECTIVE:   Pt went for her hip repair today. Was on 3L/min and comfortable on return to the SDU.  Has had some pain, agitation. Increased wheezing and then desaturation in the last hour.  Placed on venti-mask, albuterol given.   VITAL SIGNS: Temp:  [97.3 F (36.3 C)-98.6 F (37 C)] 97.4 F (36.3 C) (11/24 1224) Pulse Rate:  [92-100] 96 (11/24 1130) Resp:  [12-24] 20 (11/24 1130) BP: (106-153)/(80-100) 124/89 (11/24 1130) SpO2:  [92 %-98 %] 92 % (11/24 1130) Weight:  [57.9 kg (127 lb 9.6 oz)] 57.9 kg (127 lb 9.6 oz) (11/24 0647)  PHYSICAL EXAMINATION: General:  Ill appearing elderly female, sleepy Neuro:  Sleepy, nods to questions, generalized weakness.  HEENT:  MM pink/moist,jvd 1 cm Cardiovascular:  s1 s2 Normal rhythm no MRG Lungs: no wheeze - an improvement from 30 minutes ago per Dr Algis Liming Abdomen:  Soft non tender non distended, quiet Musculoskeletal: Muscle bulk normal for age/ gender.  Skin:  Pale/warm,  grossly intact   Recent Labs Lab 05/16/16 0306 05/17/16 0520 05/18/16 0338 05/19/16 1145  NA 132* 134* 137  --   K 4.2 4.5 4.3  --   CL 87* 85* 87*  --   CO2 36* 39* 40*  --   BUN 21* 21* 24*  --   CREATININE 0.97 0.91 0.98 1.03*  GLUCOSE 140* 123* 161*  --     Recent Labs Lab 05/16/16 1243 05/18/16 0338 05/19/16 1145  HGB 11.1* 12.2 11.8*  HCT 33.3* 37.3 35.6*  WBC 15.4* 15.4* 17.7*  PLT 247 277 264   Dg Chest Port 1 View  Result Date: 05/18/2016 CLINICAL DATA:  Shortness of breath.  Acute respiratory failure. EXAM: PORTABLE CHEST 1 VIEW COMPARISON:  05/17/2016 and 05/10/2016 and CT scan of the chest 02/07/2016 FINDINGS: Heart size and pulmonary vascularity are normal. Scarring and a masslike density in the right upper lung zone and right hilar region, unchanged. Left lung is clear. No infiltrates or effusions. IMPRESSION: Stable appearance of the chest since the prior study. Probable scarring in the right upper lung zone and right perihilar region as described. Electronically Signed   By: Lorriane Shire M.D.   On: 05/18/2016 09:24   Dg Chest Port 1 View  Result Date: 05/17/2016 CLINICAL DATA:  Short of breath EXAM: PORTABLE CHEST 1 VIEW COMPARISON:  05/14/2016 FINDINGS: The heart is normal in size. Persistent volume loss in the right lung is stable. Hazy opacity towards the right  apex is unchanged. There is linear scarring in the right upper lung. Left lung is clear. No pneumothorax or pleural effusion. IMPRESSION: Stable changes related of malignancy and volume loss in the right lung. Electronically Signed   By: Marybelle Killings M.D.   On: 05/17/2016 14:30   Dg Abd Portable 1v  Result Date: 05/18/2016 CLINICAL DATA:  Fecal impaction. EXAM: PORTABLE ABDOMEN - 1 VIEW COMPARISON:  05/16/2016 FINDINGS: There is fluid and gas slightly distended ascending and transverse portions of the colon. No visible fecal impaction. No dilated small bowel. Angulated comminuted intertrochanteric  fracture of the proximal left femur is noted. The angulation is new since the prior study of 05/10/2016. IMPRESSION: Slightly distended transverse portion of the colon. No visible fecal impaction. New angulation of the proximal left femur fracture. Electronically Signed   By: Lorriane Shire M.D.   On: 05/18/2016 09:26   Dg C-arm 1-60 Min  Result Date: 05/19/2016 CLINICAL DATA:  Internal fixation of intertrochanteric left femur fracture. EXAM: LEFT FEMUR 2 VIEWS; DG C-ARM 61-120 MIN COMPARISON:  05/10/2016 CT and radiographs FINDINGS: Four intraoperative spot films are submitted postoperatively for interpretation. An intra medullary rod nail and screws are present traversing an intertrochanteric left femur fracture, in near-anatomic alignment. No definite complicating features are noted. IMPRESSION: Internal fixation of intertrochanteric left femur fracture. Electronically Signed   By: Margarette Canada M.D.   On: 05/19/2016 10:37   Dg Femur Min 2 Views Left  Result Date: 05/19/2016 CLINICAL DATA:  Internal fixation of intertrochanteric left femur fracture. EXAM: LEFT FEMUR 2 VIEWS; DG C-ARM 61-120 MIN COMPARISON:  05/10/2016 CT and radiographs FINDINGS: Four intraoperative spot films are submitted postoperatively for interpretation. An intra medullary rod nail and screws are present traversing an intertrochanteric left femur fracture, in near-anatomic alignment. No definite complicating features are noted. IMPRESSION: Internal fixation of intertrochanteric left femur fracture. Electronically Signed   By: Margarette Canada M.D.   On: 05/19/2016 10:37    ASSESSMENT / PLAN:  70 yo woman with severe COPD, hx NSCLCA, followed by Dr Lake Bells in our office. She has a L hip fx from mechanical fall, s/p OR this am 11/24. Has been treated for AE-COPD to optimize respiratory status prior to OR. 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 S/P chemo and radiotherapy in 2014 Now with acute dyspnea and wheeze  post-op, has responded to BD's. Agitation and pain will confound her resp status. Will need to watch her closely, continue same meds. Low threshold to move to ICU if she decompensates in any way.   Baltazar Apo, MD, PhD 05/19/2016, 1:06 PM [facility] [phone] or if no answer [phone]

## 2016-05-19 NOTE — Discharge Instructions (Signed)
° ° °  1. Change dressings as needed °2. May shower but keep incisions covered and dry °3. Take lovenox to prevent blood clots °4. Take stool softeners as needed °5. Take pain meds as needed ° °

## 2016-05-19 NOTE — Anesthesia Preprocedure Evaluation (Signed)
Anesthesia Evaluation  Patient identified by MRN, date of birth, ID band Patient awake    Reviewed: Allergy & Precautions, NPO status , Patient's Chart, lab work & pertinent test results  Airway Mallampati: II  TM Distance: >3 FB Neck ROM: Full    Dental no notable dental hx.    Pulmonary COPD, Current Smoker,  Lung Ca   breath sounds clear to auscultation + decreased breath sounds      Cardiovascular hypertension, +CHF  Normal cardiovascular exam Rhythm:Regular Rate:Normal  - Left ventricle: The cavity size was normal. Wall thickness was   increased in a pattern of mild LVH. Systolic function was   moderately reduced. The estimated ejection fraction was in the   range of 35% to 40%. Diffuse hypokinesis. Due to tachycardia,   there was fusion of early and atrial contributions to ventricular   filling. Doppler parameters are consistent with high ventricular   filling pressure. - Mitral valve: There was mild regurgitation. - Right ventricle: Systolic function was mildly reduced.  Transthoracic echocardiography.  M-mode, complete 2D, spectral Doppler, and color Doppler.  Birthdate:  Patient birthdate: 1946/05/30.  Age:  Patient is 70 yr old.  Sex:  Gender: female. BMI: 21.3 kg/m^2.  Blood pressure:     128/83  Patient status: Inpatient.  Study date:  Study date: 10/26/2014. Study time: 01:08 PM.  Location:  ICU/CCU  -------------------------------------------------------------------  ------------------------------------------------------------------- Left ventricle:  Global longitudinal strain = -9.8% The cavity size was normal. Wall thickness was increased in a pattern of mild LVH. Systolic function was moderately reduced. The estimated ejection fraction was in the range of 35% to 40%. Diffuse hypokinesis. Due to tachycardia, there was fusion of early and atrial contributions to ventricular filling. Doppler parameters are  consistent with high ventricular filling pressure.  ------------------------------------------------------------------- Aortic valve:   Structurally normal valve.   Cusp separation was normal.  Doppler:  Transvalvular velocity was within the normal range. There was no stenosis. There was no regurgitation.    Neuro/Psych CVA negative psych ROS   GI/Hepatic negative GI ROS, Neg liver ROS,   Endo/Other  negative endocrine ROS  Renal/GU negative Renal ROS  negative genitourinary   Musculoskeletal negative musculoskeletal ROS (+)   Abdominal   Peds negative pediatric ROS (+)  Hematology negative hematology ROS (+)   Anesthesia Other Findings   Reproductive/Obstetrics negative OB ROS                             Anesthesia Physical Anesthesia Plan  ASA: IV  Anesthesia Plan: Spinal   Post-op Pain Management:    Induction: Intravenous  Airway Management Planned: Simple Face Mask  Additional Equipment:   Intra-op Plan:   Post-operative Plan:   Informed Consent: I have reviewed the patients History and Physical, chart, labs and discussed the procedure including the risks, benefits and alternatives for the proposed anesthesia with the patient or authorized representative who has indicated his/her understanding and acceptance.   Dental advisory given  Plan Discussed with: CRNA and Surgeon  Anesthesia Plan Comments:         Anesthesia Quick Evaluation

## 2016-05-19 NOTE — Progress Notes (Signed)
PROGRESS NOTE    Janice Daniels  IHK:[id number] DOB: October 02, 1945 DOA: 05/10/2016 PCP: Janice Lopes, MD   Brief Narrative: Janice Daniels is a 70 y.o. 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 She presented with a COPD exacerbation and left hip fracture. CCM was consulted. Respiratory status was optimized. She underwent surgery on 05/19/16.  Assessment & Plan:   Principal Problem:   Closed left hip fracture (HCC) Active Problems:   Essential hypertension   COPD (chronic obstructive pulmonary disease) with emphysema (HCC)   GERD   Lung cancer (HCC)   COPD exacerbation (HCC)   Hypothyroidism   Other specified hypothyroidism   Tobacco abuse   Chronic systolic heart failure (HCC)   Hyponatremia   Acute respiratory distress   Closed hip fracture, left - Orthopedics was consulted. Surgery was postponed until her respiratory status was stabilized. She then underwent ORIF/IM nail on 05/19/16. Management per orthopedics.  Acute respiratory failure with hypoxia secondary to COPD exacerbation and decompensated CHF - PCCM was consulted and assisted in management of her COPD exacerbation. She was transferred to stepdown unit a couple days ago for close monitoring and management.  - She was treated with oxygen, bronchodilator nebulizations, Lasix, increasing Solu-Medrol, continue budesonide/Brovana and levofloxacin. - Constipation which caused abdominal distention and respiratory compromise was also treated appropriately.  - patient was seen this morning and was doing well without dyspnea or chest pain, lungs were clear to auscultation. Discussed with surgery and advised that she could proceed.  - Postoperatively was called by the charge nurse to indicate that patient was having dyspnea and  hypoxemia. Arrived at bedside. Patient appeared slightly anxious, mildly tachypneic, hypoxic in the low 80s, reduced breath sounds and bilateral rhonchi. She improved after a nebulization treatment. CCM evaluated along with me. Repeat chest x-ray shows radiation fibrosis and right upper lung but otherwise no acute findings. Monitor closely and low threshold to transfer to ICU if she decompensates in any way.  Acute on Chronic systolic heart failure Last EF of 35-40%, mild LVH, diffuse hypokinesis. On spironolactone. Weight is down from 126 > 111 pounds since admission. Continue Lasix. - - 7.3 L since admission. Improved and appears compensated.  Hyponatremia Resolved Continue Lasix. Follow BMP  Hypertension Not on medication therapy  Hypothyroidism -continue synthroid  History of partial seizure Keppra held on admission -continue gabapentin  Tobacco abuse Counseling given  Abdominal distension Constipation Treated aggressively with bowel regimen. Multiple BMs on 11/22 with significant improvement in symptoms and abdominal distention. Follow-up chest x-ray shows no fecal impaction. Continue bowel regimen. Abdominal distention has resolved.  History of small cell lung cancer Diagnosed October 2012. Chart review indicates status post palliative radiotherapy, systemic chemotherapy prophylactic cranial irradiation, curative stereotactic radiotherapy. Office visit note dated August 2017 Cates recent CT scan showed no evidence for disease progression septic for evolving radiation changes and masslike consolidation in the right upper lobe and posterior right upper lobe. Noted also indicates this is been evaluated with PET scan in the past and showed no hypermetabolic activity -Patient will follow-up in 6 months with oncology  Anemia - Stable    DVT prophylaxis: SCDs Code Status: Full code Family Communication: discussed in detail preoperatively with patient's 2 nieces and son at bedside.   Disposition Plan: To be determined.   Consultants:   Orthopedic surgery  Pulmonology  Procedures:  ORIF/IM nail of left hip fracture on 06/18/16.   Antimicrobials:  Azithromycin (11/15)  Levaquin (11/15>>    Subjective: Seen preoperatively and patient had no complaints. Patient seen again postoperatively for worsening dyspnea and hypoxia. Treated appropriately and improved. States that her breathing has improved. Did not report pain.  Objective: Vitals:   05/19/16 1115 05/19/16 1130 05/19/16 1224 05/19/16 1354  BP: 119/90 124/89  115/76  Pulse: 98 96  (!) 115  Resp: 19 20  (!) 24  Temp:   97.4 F (36.3 C)   TempSrc:   Axillary   SpO2: 95% 92%  (!) 88%  Weight:      Height:      BP 153/95 mmHg.  Intake/Output Summary (Last 24 hours) at 05/19/16 1548 Last data filed at 05/19/16 1048  Gross per 24 hour  Intake              400 ml  Output             1525 ml  Net            -1125 ml   Filed Weights   05/17/16 1922 05/18/16 0500 05/19/16 0647  Weight: 57.2 kg (126 lb) 59.2 kg (130 lb 9.6 oz) 57.9 kg (127 lb 9.6 oz)    Examination:  General exam: Elderly female, chronically ill looking, lying comfortably propped up in bed and in no obvious distress. Respiratory system: Improved breath sounds bilaterally after nebulization treatment. Occasional rhonchi but fair air entry. No increased work of breathing. Cardiovascular system: S1 & S2 heard, RRR. No murmurs, rubs, gallops or clicks. Telemetry: ST in the 130s. Gastrointestinal system: Abdomen is non distended, soft and nontender. Normal bowel sounds heard. Central nervous system: Alert and oriented. No focal neurological deficits. Extremities: No edema. No calf tenderness Skin: No cyanosis. No rashes Psychiatry: Judgement and insight appear Impaired. Mood & affect appropriate.     Data Reviewed: I have personally reviewed following labs and imaging studies  CBC:  Recent Labs Lab 05/16/16 1243  05/18/16 0338 05/19/16 1145  WBC 15.4* 15.4* 17.7*  HGB 11.1* 12.2 11.8*  HCT 33.3* 37.3 35.6*  MCV 92.2 94.4 94.7  PLT 247 277 650   Basic Metabolic Panel:  Recent Labs Lab 05/13/16 0938 05/15/16 0530 05/16/16 0306 05/17/16 0520 05/18/16 0338 05/19/16 1145  NA 132* 133* 132* 134* 137  --   K 4.2 4.8 4.2 4.5 4.3  --   CL 95* 97* 87* 85* 87*  --   CO2 30 30 36* 39* 40*  --   GLUCOSE 139* 139* 140* 123* 161*  --   BUN 11 14 21* 21* 24*  --   CREATININE 0.94 0.95 0.97 0.91 0.98 1.03*  CALCIUM 9.0 7.8* 7.6* 7.9* 8.0*  --   MG  --   --   --  3.5* 3.5*  --   PHOS  --   --   --  3.1 3.4  --    GFR: Estimated Creatinine Clearance: 43.3 mL/min (by C-G formula based on SCr of 1.03 mg/dL (H)). Liver Function Tests: No results for input(s): AST, ALT, ALKPHOS, BILITOT, PROT, ALBUMIN in the last 168 hours. No results for input(s): LIPASE, AMYLASE in the last 168 hours. No results for input(s): AMMONIA in the last 168 hours. Coagulation Profile: No results for input(s): INR, PROTIME in the last 168 hours. Cardiac Enzymes: No results for input(s): CKTOTAL, CKMB, CKMBINDEX, TROPONINI in the last 168 hours. BNP (last 3 results) No results  for input(s): PROBNP in the last 8760 hours. HbA1C: No results for input(s): HGBA1C in the last 72 hours. CBG: No results for input(s): GLUCAP in the last 168 hours. Lipid Profile: No results for input(s): CHOL, HDL, LDLCALC, TRIG, CHOLHDL, LDLDIRECT in the last 72 hours. Thyroid Function Tests: No results for input(s): TSH, T4TOTAL, FREET4, T3FREE, THYROIDAB in the last 72 hours. Anemia Panel: No results for input(s): VITAMINB12, FOLATE, FERRITIN, TIBC, IRON, RETICCTPCT in the last 72 hours. Sepsis Labs: No results for input(s): PROCALCITON, LATICACIDVEN in the last 168 hours.  Recent Results (from the past 240 hour(s))  Surgical pcr screen     Status: Abnormal   Collection Time: 05/10/16  5:34 PM  Result Value Ref Range Status   MRSA, PCR  NEGATIVE NEGATIVE Final   Staphylococcus aureus POSITIVE (A) NEGATIVE Final    Comment:        The Xpert SA Assay (FDA approved for NASAL specimens in patients over 23 years of age), is one component of a comprehensive surveillance program.  Test performance has been validated by [facility] for patients greater than or equal to 89 year old. It is not intended to diagnose infection nor to guide or monitor treatment.   Culture, Urine     Status: None   Collection Time: 05/15/16 10:22 AM  Result Value Ref Range Status   Specimen Description URINE, RANDOM  Final   Special Requests ADDED [id number]  Final   Culture NO GROWTH  Final   Report Status 05/17/2016 FINAL  Final  MRSA PCR Screening     Status: None   Collection Time: 05/17/16  7:52 PM  Result Value Ref Range Status   MRSA by PCR NEGATIVE NEGATIVE Final    Comment:        The GeneXpert MRSA Assay (FDA approved for NASAL specimens only), is one component of a comprehensive MRSA colonization surveillance program. It is not intended to diagnose MRSA infection nor to guide or monitor treatment for MRSA infections.          Radiology Studies: Dg Chest Port 1 View  Result Date: 05/19/2016 CLINICAL DATA:  Dyspnea, recent hip surgery. History of right upper lobe lung cancer treated with radiation therapy. EXAM: PORTABLE CHEST 1 VIEW COMPARISON:  05/18/2016 chest radiograph. FINDINGS: Stable cardiomediastinal silhouette with normal heart size and right deviated mediastinum. No pneumothorax. No pleural effusion. Stable volume loss, distortion and parahilar consolidation in the upper right lung. No acute consolidative airspace disease. No pulmonary edema. IMPRESSION: 1. No acute cardiopulmonary disease. 2. 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 Electronically Signed   By: Ilona Sorrel M.D.   On: 05/19/2016 13:02   Dg Chest Port 1 View  Result Date: 05/18/2016 CLINICAL DATA:  Shortness of breath.   Acute respiratory failure. EXAM: PORTABLE CHEST 1 VIEW COMPARISON:  05/17/2016 and 05/10/2016 and CT scan of the chest 02/07/2016 FINDINGS: Heart size and pulmonary vascularity are normal. Scarring and a masslike density in the right upper lung zone and right hilar region, unchanged. Left lung is clear. No infiltrates or effusions. IMPRESSION: Stable appearance of the chest since the prior study. Probable scarring in the right upper lung zone and right perihilar region as described. Electronically Signed   By: Lorriane Shire M.D.   On: 05/18/2016 09:24   Dg Abd Portable 1v  Result Date: 05/18/2016 CLINICAL DATA:  Fecal impaction. EXAM: PORTABLE ABDOMEN - 1 VIEW COMPARISON:  05/16/2016 FINDINGS: There is fluid and gas slightly  distended ascending and transverse portions of the colon. No visible fecal impaction. No dilated small bowel. Angulated comminuted intertrochanteric fracture of the proximal left femur is noted. The angulation is new since the prior study of 05/10/2016. IMPRESSION: Slightly distended transverse portion of the colon. No visible fecal impaction. New angulation of the proximal left femur fracture. Electronically Signed   By: Lorriane Shire M.D.   On: 05/18/2016 09:26   Dg C-arm 1-60 Min  Result Date: 05/19/2016 CLINICAL DATA:  Internal fixation of intertrochanteric left femur fracture. EXAM: LEFT FEMUR 2 VIEWS; DG C-ARM 61-120 MIN COMPARISON:  05/10/2016 CT and radiographs FINDINGS: Four intraoperative spot films are submitted postoperatively for interpretation. An intra medullary rod nail and screws are present traversing an intertrochanteric left femur fracture, in near-anatomic alignment. No definite complicating features are noted. IMPRESSION: Internal fixation of intertrochanteric left femur fracture. Electronically Signed   By: Margarette Canada M.D.   On: 05/19/2016 10:37   Dg Femur Min 2 Views Left  Result Date: 05/19/2016 CLINICAL DATA:  Internal fixation of intertrochanteric  left femur fracture. EXAM: LEFT FEMUR 2 VIEWS; DG C-ARM 61-120 MIN COMPARISON:  05/10/2016 CT and radiographs FINDINGS: Four intraoperative spot films are submitted postoperatively for interpretation. An intra medullary rod nail and screws are present traversing an intertrochanteric left femur fracture, in near-anatomic alignment. No definite complicating features are noted. IMPRESSION: Internal fixation of intertrochanteric left femur fracture. Electronically Signed   By: Margarette Canada M.D.   On: 05/19/2016 10:37        Scheduled Meds: . albuterol      . arformoterol  15 mcg Nebulization BID  . aspirin EC  81 mg Oral Daily  . budesonide (PULMICORT) nebulizer solution  0.5 mg Nebulization BID  .  ceFAZolin (ANCEF) IV  2 g Intravenous To SS-Surg  .  ceFAZolin (ANCEF) IV  2 g Intravenous Q6H  . [START ON 05/20/2016] enoxaparin (LOVENOX) injection  40 mg Subcutaneous Q24H  . gabapentin  300 mg Oral BID  . guaiFENesin  1,200 mg Oral BID  . ipratropium-albuterol  3 mL Nebulization Q6H WA  . levothyroxine  25 mcg Oral QAC breakfast  . magic mouthwash  5 mL Oral QID  . methylPREDNISolone (SOLU-MEDROL) injection  80 mg Intravenous Q8H  . pantoprazole  40 mg Oral Daily  . polyethylene glycol  17 g Oral BID  . senna-docusate  1 tablet Oral BID  . simvastatin  20 mg Oral Daily   Continuous Infusions: . sodium chloride    . lactated ringers 50 mL/hr at 05/19/16 0849     LOS: 36 days     Akshaya Toepfer, MD, FACP, FHM. [facility] Pager [phone]  If 7PM-7AM, please contact night-coverage [email or web address] Password [facility] 05/19/2016, 3:48 PM

## 2016-05-20 DIAGNOSIS — J441 Chronic obstructive pulmonary disease with (acute) exacerbation: Secondary | ICD-10-CM | POA: Diagnosis not present

## 2016-05-20 DIAGNOSIS — C3401 Malignant neoplasm of right main bronchus: Secondary | ICD-10-CM | POA: Diagnosis not present

## 2016-05-20 DIAGNOSIS — S72002D Fracture of unspecified part of neck of left femur, subsequent encounter for closed fracture with routine healing: Secondary | ICD-10-CM | POA: Diagnosis not present

## 2016-05-20 DIAGNOSIS — R0603 Acute respiratory distress: Secondary | ICD-10-CM | POA: Diagnosis not present

## 2016-05-20 DIAGNOSIS — I5022 Chronic systolic (congestive) heart failure: Secondary | ICD-10-CM | POA: Diagnosis not present

## 2016-05-20 DIAGNOSIS — K5900 Constipation, unspecified: Secondary | ICD-10-CM | POA: Diagnosis not present

## 2016-05-20 DIAGNOSIS — N179 Acute kidney failure, unspecified: Secondary | ICD-10-CM

## 2016-05-20 DIAGNOSIS — J9621 Acute and chronic respiratory failure with hypoxia: Secondary | ICD-10-CM | POA: Diagnosis not present

## 2016-05-20 DIAGNOSIS — J439 Emphysema, unspecified: Secondary | ICD-10-CM | POA: Diagnosis not present

## 2016-05-20 LAB — CBC
HCT: 27.2 % — ABNORMAL LOW (ref 36.0–46.0)
Hemoglobin: 9.1 g/dL — ABNORMAL LOW (ref 12.0–15.0)
MCH: 31.3 pg (ref 26.0–34.0)
MCHC: 33.5 g/dL (ref 30.0–36.0)
MCV: 93.5 fL (ref 78.0–100.0)
PLATELETS: 256 10*3/uL (ref 150–400)
RBC: 2.91 MIL/uL — AB (ref 3.87–5.11)
RDW: 14.1 % (ref 11.5–15.5)
WBC: 19.1 10*3/uL — AB (ref 4.0–10.5)

## 2016-05-20 LAB — BASIC METABOLIC PANEL
Anion gap: 10 (ref 5–15)
BUN: 35 mg/dL — AB (ref 6–20)
CALCIUM: 7.8 mg/dL — AB (ref 8.9–10.3)
CO2: 36 mmol/L — ABNORMAL HIGH (ref 22–32)
Chloride: 88 mmol/L — ABNORMAL LOW (ref 101–111)
Creatinine, Ser: 1.26 mg/dL — ABNORMAL HIGH (ref 0.44–1.00)
GFR calc Af Amer: 48 mL/min — ABNORMAL LOW (ref >60)
GFR, EST NON AFRICAN AMERICAN: 42 mL/min — AB (ref >60)
GLUCOSE: 150 mg/dL — AB (ref 65–99)
POTASSIUM: 4.5 mmol/L (ref 3.5–5.1)
SODIUM: 134 mmol/L — AB (ref 135–145)

## 2016-05-20 MED ORDER — METHYLPREDNISOLONE SODIUM SUCC 125 MG IJ SOLR
60.0000 mg | Freq: Two times a day (BID) | INTRAMUSCULAR | Status: DC
  Administered 2016-05-20 – 2016-05-21 (×2): 60 mg via INTRAVENOUS
  Filled 2016-05-20 (×2): qty 2

## 2016-05-20 NOTE — Evaluation (Signed)
Physical Therapy Evaluation Patient Details Name: Janice Daniels MRN: [id number] DOB: [birth year] Today's Date: 05/20/2016   History of Present Illness  70 yo pt with PMH significant for COPD and Stage III NSC lung cancer. She presented 11/15 with persistent shortness of breath and left hip pain after a fall. (She reported she fell out of bed while sleeping). She states about two weeks prior she began have fevers and productive cough. 11/16 surgery for hip delayed due to poor respiratory status. 11/24 Lt hip IM nail.     Clinical Impression  Patient is s/p above surgery resulting in functional limitations due to the deficits listed below (see PT Problem List). Patient had very limited mobility PTA due to respiratory status (spent most of her day in bed; husband "waits on her" per son). Patient will benefit from skilled PT to increase their independence and safety with mobility to allow discharge to the venue listed below.       Follow Up Recommendations SNF;Supervision/Assistance - 24 hour    Equipment Recommendations  None recommended by PT (continue to assess)    Recommendations for Other Services OT consult     Precautions / Restrictions Precautions Precautions: Fall Precaution Comments: closely monitor RR, SaO2, HR Restrictions Weight Bearing Restrictions: Yes LLE Weight Bearing: Weight bearing as tolerated      Mobility  Bed Mobility Overal bed mobility: Needs Assistance;+2 for physical assistance Bed Mobility: Rolling;Supine to Sit;Sit to Supine Rolling: Mod assist   Supine to sit: Max assist;+2 for physical assistance;HOB elevated Sit to supine: Max assist;+2 for physical assistance;+2 for safety/equipment   General bed mobility comments: incr time due to pain; assist due to weakness  Transfers                 General transfer comment: did not attempt due to incr HR, RR sitting at EOB  Ambulation/Gait                Stairs             Wheelchair Mobility    Modified Rankin (Stroke Patients Only)       Balance Overall balance assessment: Needs assistance Sitting-balance support: Bilateral upper extremity supported;Feet supported Sitting balance-Leahy Scale: Poor Sitting balance - Comments: EOB x 4 minutes                                      Pertinent Vitals/Pain HR 102-128 with progressive mobility RR 22-28 SaO2 99% to 93% on O2 *further mobility deferred due to VS response  Pain Assessment: 0-10 Pain Score: 9  Pain Location: Lt hip Pain Descriptors / Indicators: Constant;Operative site guarding Pain Intervention(s): Limited activity within patient's tolerance;Monitored during session;Premedicated before session;Repositioned;Patient requesting pain meds-RN notified;Ice applied    Home Living Family/patient expects to be discharged to:: Private residence Living Arrangements: Spouse/significant other Available Help at Discharge: Family;Available 24 hours/day Type of Home: House Home Access: Stairs to enter Entrance Stairs-Rails: Right;Left;Can reach both Entrance Stairs-Number of Steps: 4 Home Layout: One level Home Equipment: Walker - 4 wheels;Cane - single point      Prior Function Level of Independence: Needs assistance   Gait / Transfers Assistance Needed: only walking short distance (~2 rooms) PTA; reports she spent most of day in bed (including eating meals) due to respiratory status     Comments: fell out of bed in her sleep     Hand Dominance  Dominant Hand: Right    Extremity/Trunk Assessment   Upper Extremity Assessment: Defer to OT evaluation           Lower Extremity Assessment: RLE deficits/detail;LLE deficits/detail RLE Deficits / Details: AAROM WFL except ankle DF -30 (contracted in 30* PF); knee extension 4/5 LLE Deficits / Details: AAROM hip limited to 70 supine (to 90 in sitting), ankle DF -15; knee extension 3+  Cervical / Trunk Assessment: Other  exceptions  Communication   Communication: No difficulties  Cognition Arousal/Alertness: Awake/alert (frequently closes eyes, but alert) Behavior During Therapy: Flat affect;Anxious (flat progressing to anxious) Overall Cognitive Status: Impaired/Different from baseline Area of Impairment: Memory     Memory:  (poor historian "I don't know")              General Comments General comments (skin integrity, edema, etc.): son present    Exercises General Exercises - Lower Extremity Ankle Circles/Pumps: AAROM;PROM;Both;5 reps (30 sec stretch) Heel Slides: AAROM;Both;5 reps   Assessment/Plan    PT Assessment Patient needs continued PT services  PT Problem List Decreased strength;Decreased range of motion;Decreased activity tolerance;Decreased balance;Decreased mobility;Decreased knowledge of use of DME;Cardiopulmonary status limiting activity;Pain          PT Treatment Interventions DME instruction;Gait training;Functional mobility training;Therapeutic activities;Therapeutic exercise;Balance training;Patient/family education    PT Goals (Current goals can be found in the Care Plan section)  Acute Rehab PT Goals Patient Stated Goal: be able to walk again PT Goal Formulation: With patient Time For Goal Achievement: 06/03/16 Potential to Achieve Goals: Fair    Frequency Min 3X/week   Barriers to discharge        Co-evaluation               End of Session Equipment Utilized During Treatment: Gait belt;Oxygen Activity Tolerance: Treatment limited secondary to medical complications (Comment) Patient left: in bed;with call bell/phone within reach;with bed alarm set;with SCD's reapplied;with family/visitor present Nurse Communication: Mobility status (poor resp tolerance)         Time: 8416-6063 PT Time Calculation (min) (ACUTE ONLY): 39 min   Charges:   PT Evaluation $PT Eval High Complexity: 1 Procedure PT Treatments $Therapeutic Exercise: 8-22 mins   PT G  CodesJeanie Cooks Kahle Mcqueen June 08, 2016, 11:38 AM Pager [phone]

## 2016-05-20 NOTE — Progress Notes (Signed)
Physical Therapy Brief note (full evaluation to follow)    05/20/16 0900  PT Assessment  PT Recommendation/Assessment Patient needs continued PT services  PT Recommendation  Follow Up Recommendations SNF  PT equipment None recommended by PT (TBA)    05/20/2016 Barry Brunner, PT Pager: [phone]

## 2016-05-20 NOTE — Progress Notes (Signed)
Name: Janice Daniels MRN: [id number] DOB: 14-Oct-1945    ADMISSION DATE:  05/10/2016 CONSULTATION DATE:  11/16 REFERRING MD :  Dr. Lonny Prude [facility]  CHIEF COMPLAINT:  Shortness of Breath  HISTORY OF PRESENT ILLNESS:  70 y/o pt with PMH significant for COPD and Stage III [place] lung cancer. She presents 11/15 with persistent shortness of breath and left hip pain after a fall.  She states about two weeks prior she began have fevers and productive cough.  She talked with PCP over the phone, but nothing was prescribed.  Symptoms improved over the past week, however she is still having SOB.  11/15 she fell at home and presented to ED with hip pain, where she was found to have L hip fracture. She was admitted in this setting also with COPD exacerbation. She was treated with bronchodilators, steroids, and antibiotics with plans for OR on 11/16, however just prior to presenting to OR was found to have increased work of breathing. Surgery postponed and pulmonary consulted.   SUBJECTIVE:   Family in room. Pt states breathing better, no sputum.  VITAL SIGNS: Temp:  [97.3 F (36.3 C)-98.3 F (36.8 C)] 98.1 F (36.7 C) (11/25 0813) Pulse Rate:  [56-115] 101 (11/25 0813) Resp:  [16-27] 16 (11/25 0813) BP: (106-126)/(74-100) 116/74 (11/25 0813) SpO2:  [85 %-100 %] 99 % (11/25 0821) Weight:  [57.6 kg (127 lb)] 57.6 kg (127 lb) (11/25 0447)  PHYSICAL EXAMINATION: General:  Ill appearing elderly female, weak but more alert than when I saw her 11/23 Neuro:  Awake, answers questions, generalized weakness.  HEENT:  MM pink/moist, Cardiovascular:  s1 s2 Normal rhythm no MRG Lungs: no wheeze, short sentences but not labored on O2 np Abdomen:  Soft non tender non distended, quiet Musculoskeletal: Muscle bulk normal for age/ gender.  Skin:  Pale/warm, grossly intact   Recent Labs Lab 05/17/16 0520 05/18/16 0338 05/19/16 1145 05/20/16 0315  NA 134* 137  --  134*  K 4.5 4.3  --  4.5  CL 85* 87*  --   88*  CO2 39* 40*  --  36*  BUN 21* 24*  --  35*  CREATININE 0.91 0.98 1.03* 1.26*  GLUCOSE 123* 161*  --  150*    Recent Labs Lab 05/18/16 0338 05/19/16 1145 05/20/16 0315  HGB 12.2 11.8* 9.1*  HCT 37.3 35.6* 27.2*  WBC 15.4* 17.7* 19.1*  PLT 277 264 256   Dg Chest Port 1 View  Result Date: 05/19/2016 CLINICAL DATA:  Dyspnea, recent hip surgery. History of right upper lobe lung cancer treated with radiation therapy. EXAM: PORTABLE CHEST 1 VIEW COMPARISON:  05/18/2016 chest radiograph. FINDINGS: Stable cardiomediastinal silhouette with normal heart size and right deviated mediastinum. No pneumothorax. No pleural effusion. Stable volume loss, distortion and parahilar consolidation in the upper right lung. No acute consolidative airspace disease. No pulmonary edema. IMPRESSION: 1. No acute cardiopulmonary disease. 2. 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 Electronically Signed   By: Ilona Sorrel M.D.   On: 05/19/2016 13:02   Dg C-arm 1-60 Min  Result Date: 05/19/2016 CLINICAL DATA:  Internal fixation of intertrochanteric left femur fracture. EXAM: LEFT FEMUR 2 VIEWS; DG C-ARM 61-120 MIN COMPARISON:  05/10/2016 CT and radiographs FINDINGS: Four intraoperative spot films are submitted postoperatively for interpretation. An intra medullary rod nail and screws are present traversing an intertrochanteric left femur fracture, in near-anatomic alignment. No definite complicating features are noted. IMPRESSION: Internal fixation of intertrochanteric left femur fracture.  Electronically Signed   By: Margarette Canada M.D.   On: 05/19/2016 10:37   Dg Femur Min 2 Views Left  Result Date: 05/19/2016 CLINICAL DATA:  Internal fixation of intertrochanteric left femur fracture. EXAM: LEFT FEMUR 2 VIEWS; DG C-ARM 61-120 MIN COMPARISON:  05/10/2016 CT and radiographs FINDINGS: Four intraoperative spot films are submitted postoperatively for interpretation. An intra medullary rod nail and  screws are present traversing an intertrochanteric left femur fracture, in near-anatomic alignment. No definite complicating features are noted. IMPRESSION: Internal fixation of intertrochanteric left femur fracture. Electronically Signed   By: Margarette Canada M.D.   On: 05/19/2016 10:37    ASSESSMENT / PLAN:  70 yo woman with severe COPD, hx NSCLCA, followed by Dr Lake Bells in our office. She has a L hip fx from mechanical fall, s/p OR this am 11/24. Has been treated for AE-COPD to optimize respiratory status prior to OR. H/o RUL limited stage [place] lung CA diagnosed 2012. S/P chemo and radiotherapy in 2014 Acute dyspnea and wheeze post-op, has responded to BD's. Breathing now better than preop and may be approaching baseline, complicated by weakness and post-op status. Not a lot of secretion problems or bronchospasm evident.  Agitation and pain will confound her resp status. Will need to watch her closely, continue same meds. Low threshold to move to ICU if she decompensates in any way.   Note deteriorating renal function- need to avoid volume depletion.  CD Rayburn Mundis, MD 05/20/2016, 9:38 AM [facility] [phone] or if no answer [phone]

## 2016-05-20 NOTE — Progress Notes (Signed)
Subjective: 1 Day Post-Op Procedure(s) (LRB): INTRAMEDULLARY (IM) NAIL LEFT INTERTROCHANTERIC HIP (Left) Patient reports pain as moderate.  Son at the bedside.  PT at the bedside as well.  Tolerated surgery yesterday.  Vitals and H&H stable.  Objective: Vital signs in last 24 hours: Temp:  [97.3 F (36.3 C)-98.3 F (36.8 C)] 98.1 F (36.7 C) (11/25 0813) Pulse Rate:  [56-115] 101 (11/25 0813) Resp:  [16-27] 16 (11/25 0813) BP: (106-126)/(74-100) 116/74 (11/25 0813) SpO2:  [85 %-100 %] 99 % (11/25 0821) Weight:  [127 lb (57.6 kg)] 127 lb (57.6 kg) (11/25 0447)  Intake/Output from previous day: 11/24 0701 - 11/25 0700 In: 1050 [P.O.:290; I.V.:660; IV Piggyback:100] Out: 625 [Urine:500; Blood:125] Intake/Output this shift: No intake/output data recorded.   Recent Labs  05/18/16 0338 05/19/16 1145 05/20/16 0315  HGB 12.2 11.8* 9.1*    Recent Labs  05/19/16 1145 05/20/16 0315  WBC 17.7* 19.1*  RBC 3.76* 2.91*  HCT 35.6* 27.2*  PLT 264 256    Recent Labs  05/18/16 0338 05/19/16 1145 05/20/16 0315  NA 137  --  134*  K 4.3  --  4.5  CL 87*  --  88*  CO2 40*  --  36*  BUN 24*  --  35*  CREATININE 0.98 1.03* 1.26*  GLUCOSE 161*  --  150*  CALCIUM 8.0*  --  7.8*   No results for input(s): LABPT, INR in the last 72 hours.  Incision: scant drainage  Assessment/Plan: 1 Day Post-Op Procedure(s) (LRB): INTRAMEDULLARY (IM) NAIL LEFT INTERTROCHANTERIC HIP (Left) Up with therapy - WBAT left hip  Mcarthur Rossetti 05/20/2016, 8:57 AM

## 2016-05-20 NOTE — Progress Notes (Signed)
PROGRESS NOTE    Janice Daniels  XWR:[id number] DOB: 1946/03/01 DOA: 05/10/2016 PCP: Donnajean Lopes, MD   Brief Narrative: Janice Daniels is a 70 y.o. 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 She presented with a COPD exacerbation and left hip fracture. CCM was consulted. Respiratory status was optimized. She underwent surgery on 05/19/16.  Assessment & Plan:   Principal Problem:   Closed left hip fracture (HCC) Active Problems:   Essential hypertension   COPD (chronic obstructive pulmonary disease) with emphysema (HCC)   GERD   Lung cancer (HCC)   COPD exacerbation (HCC)   Hypothyroidism   Other specified hypothyroidism   Tobacco abuse   Chronic systolic heart failure (HCC)   Hyponatremia   Acute respiratory distress   Closed hip fracture, left - Orthopedics was consulted. Surgery was postponed until her respiratory status was stabilized. She then underwent ORIF/IM nail on 05/19/16. Management per orthopedics: Weightbearing as tolerated. On Lovenox for DVT prophylaxis.  Acute respiratory failure with hypoxia secondary to COPD exacerbation and decompensated CHF - PCCM was consulted and assisted in management of her COPD exacerbation. She was transferred to stepdown unit preoperatively for close monitoring and management.  - She was treated with oxygen, bronchodilator nebulizations, Lasix, increasing Solu-Medrol, continue budesonide/Brovana and levofloxacin. - Constipation which caused abdominal distention and respiratory compromise was also treated appropriately.  - Postoperatively had episode of worsening dyspnea and hypoxia. This resolved after a nebulization treatment. Since then she has done well without any worsening. Reduce IV Solu-Medrol. Continue rest of her medications.  Continue to monitor closely. - Repeat chest x-ray shows radiation fibrosis and right upper lung but otherwise no acute findings.  - Monitor closely and low threshold to transfer to ICU if she decompensates in any way.  Acute on Chronic systolic heart failure Last EF of 35-40%, mild LVH, diffuse hypokinesis. Clinically compensated and may even be slightly on the dry side given mild bump in creatinine. Encouraged oral fluid intake. Has not been on Lasix since 11/22. - - 6.9 L since admission.   Hyponatremia Intermittent and stable.  Hypertension Not on medication therapy  Hypothyroidism -continue synthroid  History of partial seizure Keppra held on admission -continue gabapentin  Tobacco abuse Counseling given  Abdominal distension Constipation Treated aggressively with bowel regimen. Multiple BMs on 11/22 with significant improvement in symptoms and abdominal distention. Follow-up chest x-ray shows no fecal impaction. Continue bowel regimen. Abdominal distention has resolved.  History of small cell lung cancer Diagnosed October 2012. Chart review indicates status post palliative radiotherapy, systemic chemotherapy prophylactic cranial irradiation, curative stereotactic radiotherapy. Office visit note dated August 2017 Cates recent CT scan showed no evidence for disease progression septic for evolving radiation changes and masslike consolidation in the right upper lobe and posterior right upper lobe. Noted also indicates this is been evaluated with PET scan in the past and showed no hypermetabolic activity -Patient will follow-up in 6 months with oncology  Anemia - Stable   Acute kidney injury May be due to volume depletion. Encouraged oral fluid intake. Follow BMP in a.m.   Acute blood loss anemia - Hemoglobin has dropped from 11.8 preoperatively to 9.1. Follow CBC in a.m. Transfuse if hemoglobin <7 g per DL.  DVT prophylaxis: SCDs Code Status: Full code Family  Communication: discussed in detail with patient's grandson at bedside. Disposition Plan: To be  determined.   Consultants:   Orthopedic surgery  Pulmonology  Procedures:   ORIF/IM nail of left hip fracture on 06/18/16.   Antimicrobials:  Azithromycin (11/15)  Levaquin (11/15>>    Subjective: Denies dyspnea. States that she slept well last night. No other complaints reported. As per RN, no acute issues.   Objective: Vitals:   05/20/16 0447 05/20/16 0813 05/20/16 0816 05/20/16 0821  BP: 114/79 116/74    Pulse: (!) 106 (!) 101    Resp: 18 16    Temp: 98.3 F (36.8 C) 98.1 F (36.7 C)    TempSrc: Oral Oral    SpO2: 99% 100% 96% 99%  Weight: 57.6 kg (127 lb)     Height:      BP 153/95 mmHg.  Intake/Output Summary (Last 24 hours) at 05/20/16 1240 Last data filed at 05/20/16 0600  Gross per 24 hour  Intake              410 ml  Output              250 ml  Net              160 ml   Filed Weights   05/18/16 0500 05/19/16 0647 05/20/16 0447  Weight: 59.2 kg (130 lb 9.6 oz) 57.9 kg (127 lb 9.6 oz) 57.6 kg (127 lb)    Examination:  General exam: Elderly female, chronically ill looking, lying comfortably propped up in bed and in no obvious distress. Respiratory system: Fair breath sounds bilaterally. Occasional rhonchi right upper lung fields but otherwise clear to auscultation. No increased work of breathing. Cardiovascular system: S1 & S2 heard, RRR. No murmurs, rubs, gallops or clicks. Telemetry: ST in the 100s. Gastrointestinal system: Abdomen is non distended, soft and nontender. Normal bowel sounds heard. Central nervous system: Alert and oriented. No focal neurological deficits. Extremities: No edema. No calf tenderness. Left hip surgical site dressing intact.  Skin: No cyanosis. No rashes Psychiatry: Judgement and insight appear Impaired. Mood & affect appropriate.     Data Reviewed: I have personally reviewed following labs and imaging  studies  CBC:  Recent Labs Lab 05/16/16 1243 05/18/16 0338 05/19/16 1145 05/20/16 0315  WBC 15.4* 15.4* 17.7* 19.1*  HGB 11.1* 12.2 11.8* 9.1*  HCT 33.3* 37.3 35.6* 27.2*  MCV 92.2 94.4 94.7 93.5  PLT 247 277 264 694   Basic Metabolic Panel:  Recent Labs Lab 05/15/16 0530 05/16/16 0306 05/17/16 0520 05/18/16 0338 05/19/16 1145 05/20/16 0315  NA 133* 132* 134* 137  --  134*  K 4.8 4.2 4.5 4.3  --  4.5  CL 97* 87* 85* 87*  --  88*  CO2 30 36* 39* 40*  --  36*  GLUCOSE 139* 140* 123* 161*  --  150*  BUN 14 21* 21* 24*  --  35*  CREATININE 0.95 0.97 0.91 0.98 1.03* 1.26*  CALCIUM 7.8* 7.6* 7.9* 8.0*  --  7.8*  MG  --   --  3.5* 3.5*  --   --   PHOS  --   --  3.1 3.4  --   --    GFR: Estimated Creatinine Clearance: 35.4 mL/min (by C-G formula based on SCr of 1.26 mg/dL (H)). Liver Function Tests: No results for input(s): AST, ALT, ALKPHOS, BILITOT, PROT, ALBUMIN in the last 168 hours. No results for input(s): LIPASE, AMYLASE in the last 168 hours. No results for input(s): AMMONIA in the last 168 hours. Coagulation Profile: No results for input(s):  INR, PROTIME in the last 168 hours. Cardiac Enzymes: No results for input(s): CKTOTAL, CKMB, CKMBINDEX, TROPONINI in the last 168 hours. BNP (last 3 results) No results for input(s): PROBNP in the last 8760 hours. HbA1C: No results for input(s): HGBA1C in the last 72 hours. CBG:  Recent Labs Lab 05/19/16 1625  GLUCAP 176*   Lipid Profile: No results for input(s): CHOL, HDL, LDLCALC, TRIG, CHOLHDL, LDLDIRECT in the last 72 hours. Thyroid Function Tests: No results for input(s): TSH, T4TOTAL, FREET4, T3FREE, THYROIDAB in the last 72 hours. Anemia Panel: No results for input(s): VITAMINB12, FOLATE, FERRITIN, TIBC, IRON, RETICCTPCT in the last 72 hours. Sepsis Labs: No results for input(s): PROCALCITON, LATICACIDVEN in the last 168 hours.  Recent Results (from the past 240 hour(s))  Surgical pcr screen     Status:  Abnormal   Collection Time: 05/10/16  5:34 PM  Result Value Ref Range Status   MRSA, PCR NEGATIVE NEGATIVE Final   Staphylococcus aureus POSITIVE (A) NEGATIVE Final    Comment:        The Xpert SA Assay (FDA approved for NASAL specimens in patients over 28 years of age), is one component of a comprehensive surveillance program.  Test performance has been validated by [facility] for patients greater than or equal to 46 year old. It is not intended to diagnose infection nor to guide or monitor treatment.   Culture, Urine     Status: None   Collection Time: 05/15/16 10:22 AM  Result Value Ref Range Status   Specimen Description URINE, RANDOM  Final   Special Requests ADDED [id number]  Final   Culture NO GROWTH  Final   Report Status 05/17/2016 FINAL  Final  MRSA PCR Screening     Status: None   Collection Time: 05/17/16  7:52 PM  Result Value Ref Range Status   MRSA by PCR NEGATIVE NEGATIVE Final    Comment:        The GeneXpert MRSA Assay (FDA approved for NASAL specimens only), is one component of a comprehensive MRSA colonization surveillance program. It is not intended to diagnose MRSA infection nor to guide or monitor treatment for MRSA infections.          Radiology Studies: Dg Chest Port 1 View  Result Date: 05/19/2016 CLINICAL DATA:  Dyspnea, recent hip surgery. History of right upper lobe lung cancer treated with radiation therapy. EXAM: PORTABLE CHEST 1 VIEW COMPARISON:  05/18/2016 chest radiograph. FINDINGS: Stable cardiomediastinal silhouette with normal heart size and right deviated mediastinum. No pneumothorax. No pleural effusion. Stable volume loss, distortion and parahilar consolidation in the upper right lung. No acute consolidative airspace disease. No pulmonary edema. IMPRESSION: 1. No acute cardiopulmonary disease. 2. 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 Electronically Signed   By: Ilona Sorrel M.D.   On: 05/19/2016 13:02    Dg C-arm 1-60 Min  Result Date: 05/19/2016 CLINICAL DATA:  Internal fixation of intertrochanteric left femur fracture. EXAM: LEFT FEMUR 2 VIEWS; DG C-ARM 61-120 MIN COMPARISON:  05/10/2016 CT and radiographs FINDINGS: Four intraoperative spot films are submitted postoperatively for interpretation. An intra medullary rod nail and screws are present traversing an intertrochanteric left femur fracture, in near-anatomic alignment. No definite complicating features are noted. IMPRESSION: Internal fixation of intertrochanteric left femur fracture. Electronically Signed   By: Margarette Canada M.D.   On: 05/19/2016 10:37   Dg Femur Min 2 Views Left  Result Date: 05/19/2016 CLINICAL DATA:  Internal fixation of intertrochanteric  left femur fracture. EXAM: LEFT FEMUR 2 VIEWS; DG C-ARM 61-120 MIN COMPARISON:  05/10/2016 CT and radiographs FINDINGS: Four intraoperative spot films are submitted postoperatively for interpretation. An intra medullary rod nail and screws are present traversing an intertrochanteric left femur fracture, in near-anatomic alignment. No definite complicating features are noted. IMPRESSION: Internal fixation of intertrochanteric left femur fracture. Electronically Signed   By: Margarette Canada M.D.   On: 05/19/2016 10:37        Scheduled Meds: . arformoterol  15 mcg Nebulization BID  . aspirin EC  81 mg Oral Daily  . budesonide (PULMICORT) nebulizer solution  0.5 mg Nebulization BID  . enoxaparin (LOVENOX) injection  40 mg Subcutaneous Q24H  . gabapentin  300 mg Oral BID  . guaiFENesin  1,200 mg Oral BID  . ipratropium-albuterol  3 mL Nebulization Q6H WA  . levothyroxine  25 mcg Oral QAC breakfast  . magic mouthwash  5 mL Oral QID  . methylPREDNISolone (SOLU-MEDROL) injection  80 mg Intravenous Q8H  . pantoprazole  40 mg Oral Daily  . polyethylene glycol  17 g Oral BID  . senna-docusate  1 tablet Oral BID  . simvastatin  20 mg Oral Daily   Continuous Infusions:    LOS: 10 days      Jilda Kress, MD, FACP, FHM. [facility] Pager [phone]  If 7PM-7AM, please contact night-coverage [email or web address] Password [email or web address] 05/20/2016, 12:40 PM

## 2016-05-21 ENCOUNTER — Encounter (HOSPITAL_COMMUNITY): Payer: Self-pay | Admitting: Orthopaedic Surgery

## 2016-05-21 DIAGNOSIS — G40909 Epilepsy, unspecified, not intractable, without status epilepticus: Secondary | ICD-10-CM | POA: Diagnosis not present

## 2016-05-21 DIAGNOSIS — R0603 Acute respiratory distress: Secondary | ICD-10-CM | POA: Diagnosis not present

## 2016-05-21 DIAGNOSIS — N179 Acute kidney failure, unspecified: Secondary | ICD-10-CM | POA: Diagnosis not present

## 2016-05-21 DIAGNOSIS — Z9221 Personal history of antineoplastic chemotherapy: Secondary | ICD-10-CM | POA: Diagnosis not present

## 2016-05-21 DIAGNOSIS — J439 Emphysema, unspecified: Secondary | ICD-10-CM | POA: Diagnosis not present

## 2016-05-21 DIAGNOSIS — I429 Cardiomyopathy, unspecified: Secondary | ICD-10-CM | POA: Diagnosis not present

## 2016-05-21 DIAGNOSIS — E871 Hypo-osmolality and hyponatremia: Secondary | ICD-10-CM | POA: Diagnosis not present

## 2016-05-21 DIAGNOSIS — I110 Hypertensive heart disease with heart failure: Secondary | ICD-10-CM | POA: Diagnosis not present

## 2016-05-21 DIAGNOSIS — J441 Chronic obstructive pulmonary disease with (acute) exacerbation: Secondary | ICD-10-CM | POA: Diagnosis not present

## 2016-05-21 DIAGNOSIS — I5023 Acute on chronic systolic (congestive) heart failure: Secondary | ICD-10-CM | POA: Diagnosis not present

## 2016-05-21 DIAGNOSIS — C3401 Malignant neoplasm of right main bronchus: Secondary | ICD-10-CM | POA: Diagnosis not present

## 2016-05-21 DIAGNOSIS — S7222XA Displaced subtrochanteric fracture of left femur, initial encounter for closed fracture: Secondary | ICD-10-CM | POA: Diagnosis not present

## 2016-05-21 DIAGNOSIS — D62 Acute posthemorrhagic anemia: Secondary | ICD-10-CM | POA: Diagnosis not present

## 2016-05-21 DIAGNOSIS — K5900 Constipation, unspecified: Secondary | ICD-10-CM | POA: Diagnosis not present

## 2016-05-21 DIAGNOSIS — S72002D Fracture of unspecified part of neck of left femur, subsequent encounter for closed fracture with routine healing: Secondary | ICD-10-CM | POA: Diagnosis not present

## 2016-05-21 DIAGNOSIS — B370 Candidal stomatitis: Secondary | ICD-10-CM | POA: Diagnosis not present

## 2016-05-21 DIAGNOSIS — J9621 Acute and chronic respiratory failure with hypoxia: Secondary | ICD-10-CM | POA: Diagnosis not present

## 2016-05-21 DIAGNOSIS — I5022 Chronic systolic (congestive) heart failure: Secondary | ICD-10-CM | POA: Diagnosis not present

## 2016-05-21 LAB — BASIC METABOLIC PANEL
ANION GAP: 9 (ref 5–15)
BUN: 30 mg/dL — ABNORMAL HIGH (ref 6–20)
CHLORIDE: 90 mmol/L — AB (ref 101–111)
CO2: 37 mmol/L — AB (ref 22–32)
Calcium: 7.8 mg/dL — ABNORMAL LOW (ref 8.9–10.3)
Creatinine, Ser: 1.02 mg/dL — ABNORMAL HIGH (ref 0.44–1.00)
GFR calc Af Amer: >60 mL/min (ref >60)
GFR, EST NON AFRICAN AMERICAN: 54 mL/min — AB (ref >60)
GLUCOSE: 116 mg/dL — AB (ref 65–99)
POTASSIUM: 4.4 mmol/L (ref 3.5–5.1)
Sodium: 136 mmol/L (ref 135–145)

## 2016-05-21 LAB — CBC
HCT: 26.9 % — ABNORMAL LOW (ref 36.0–46.0)
HEMATOCRIT: 22.1 % — AB (ref 36.0–46.0)
HEMOGLOBIN: 7.6 g/dL — AB (ref 12.0–15.0)
HEMOGLOBIN: 9.3 g/dL — AB (ref 12.0–15.0)
MCH: 31.8 pg (ref 26.0–34.0)
MCH: 31.1 pg (ref 26.0–34.0)
MCHC: 34.4 g/dL (ref 30.0–36.0)
MCHC: 34.6 g/dL (ref 30.0–36.0)
MCV: 92.5 fL (ref 78.0–100.0)
MCV: 90 fL (ref 78.0–100.0)
PLATELETS: 195 10*3/uL (ref 150–400)
PLATELETS: 192 10*3/uL (ref 150–400)
RBC: 2.39 MIL/uL — AB (ref 3.87–5.11)
RBC: 2.99 MIL/uL — AB (ref 3.87–5.11)
RDW: 14.2 % (ref 11.5–15.5)
RDW: 14.1 % (ref 11.5–15.5)
WBC: 13 10*3/uL — AB (ref 4.0–10.5)
WBC: 15 10*3/uL — AB (ref 4.0–10.5)

## 2016-05-21 LAB — PREPARE RBC (CROSSMATCH)

## 2016-05-21 LAB — ABO/RH: ABO/RH(D): O NEG

## 2016-05-21 MED ORDER — SODIUM CHLORIDE 0.9 % IV SOLN
Freq: Once | INTRAVENOUS | Status: AC
  Administered 2016-05-21: 08:00:00 via INTRAVENOUS

## 2016-05-21 MED ORDER — FLUCONAZOLE 50 MG PO TABS
50.0000 mg | ORAL_TABLET | Freq: Every day | ORAL | Status: DC
  Administered 2016-05-21 – 2016-05-24 (×4): 50 mg via ORAL
  Filled 2016-05-21 (×4): qty 1

## 2016-05-21 MED ORDER — METHYLPREDNISOLONE SODIUM SUCC 40 MG IJ SOLR
40.0000 mg | Freq: Two times a day (BID) | INTRAMUSCULAR | Status: DC
  Administered 2016-05-21 – 2016-05-24 (×6): 40 mg via INTRAVENOUS
  Filled 2016-05-21 (×7): qty 1

## 2016-05-21 NOTE — Progress Notes (Signed)
OT Cancellation Note  Patient Details Name: Janice Daniels MRN: [id number] DOB: 29-Nov-1945   Cancelled Treatment:    Reason Eval/Treat Not Completed: Other (comment). Pt got pain meds and muscle relaxer 25 minutes ago and only opens her eyes momentarily and then drifts back off to sleep--not appropriate to try and eval at this time, we will re-attempt tomorrow.  Almon Register [phone] 05/21/2016, 2:27 PM

## 2016-05-21 NOTE — Progress Notes (Signed)
Pharmacy Antibiotic Note  Janice Daniels is a 70 y.o. female admitted on 05/10/2016 with left hip pain s/p fall. Per MD, patient has oral candidiasis and pharmacy consulted for fluconazole dosing. WBC 13.0, SCr 1.26 > 1.02. Afebrile. Will start dose-reduced fluconazole per abx book.   Plan: Fluconazole '50mg'$  PO q24h Monitor clinical progression and renal function Adjust dose if CrCl >50 mL/min  Height: '5\' 4"'$  (162.6 cm) Weight: 133 lb 12.8 oz (60.7 kg) IBW/kg (Calculated) : 54.7  Temp (24hrs), Avg:98.1 F (36.7 C), Min:97.7 F (36.5 C), Max:98.7 F (37.1 C)   Recent Labs Lab 05/16/16 1243 05/17/16 0520 05/18/16 0338 05/19/16 1145 05/20/16 0315 05/21/16 0416  WBC 15.4*  --  15.4* 17.7* 19.1* 13.0*  CREATININE  --  0.91 0.98 1.03* 1.26* 1.02*    Estimated Creatinine Clearance: 43.7 mL/min (by C-G formula based on SCr of 1.02 mg/dL (H)).    Allergies  Allergen Reactions  . Codeine Other (See Comments)    Elevated pulse   Thank you for allowing pharmacy to be a part of this patient's care.  Dierdre Harness, Cain Sieve, PharmD Clinical Pharmacy Resident [phone] (Pager) 05/21/2016 10:19 AM

## 2016-05-21 NOTE — Progress Notes (Addendum)
PROGRESS NOTE    Janice Daniels  NFA:[id number] DOB: August 01, 1945 DOA: 05/10/2016 PCP: Donnajean Lopes, MD   Brief Narrative: Janice Daniels is a 70 y.o. 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 She presented with a COPD exacerbation and left hip fracture. CCM was consulted. Respiratory status was optimized. She underwent surgery on 05/19/16.  Assessment & Plan:   Principal Problem:   Closed left hip fracture (HCC) Active Problems:   Essential hypertension   COPD (chronic obstructive pulmonary disease) with emphysema (HCC)   GERD   Lung cancer (HCC)   COPD exacerbation (HCC)   Hypothyroidism   Acute on chronic respiratory failure with hypoxia (HCC)   Other specified hypothyroidism   Tobacco abuse   Chronic systolic heart failure (HCC)   Hyponatremia   Acute respiratory distress   AKI (acute kidney injury) ([place])   Closed hip fracture, left, status post ORIF - Orthopedics was consulted. Surgery was postponed until her respiratory status was stabilized. She then underwent ORIF/IM nail on 05/19/16. Management per orthopedics: Weightbearing as tolerated. On Lovenox for DVT prophylaxis.  Acute respiratory failure with hypoxia secondary to COPD exacerbation and decompensated CHF - PCCM was consulted and assisted in management of her COPD exacerbation. She was transferred to stepdown unit preoperatively for close monitoring and management.  - She was treated with oxygen, bronchodilator nebulizations, Lasix, increasing Solu-Medrol, continue budesonide/Brovana and levofloxacin. - Constipation which caused abdominal distention and respiratory compromise was also treated appropriately.  - Postoperatively had episode of worsening dyspnea and hypoxia. This resolved after a nebulization treatment.  Since then she has done well without any worsening. Continue to reduce IV Solu-Medrol and may transition to oral steroid taper on 11/27. Continue rest of her medications. Continue to monitor closely. - Repeat chest x-ray shows radiation fibrosis and right upper lung but otherwise no acute findings.  - Monitor closely and low threshold to transfer to ICU if she decompensates in any way.  Acute on Chronic systolic heart failure Last EF of 35-40%, mild LVH, diffuse hypokinesis. Clinically compensated. Encouraged oral fluid intake. Has not been on Lasix since 11/22. - - 6.9 L since admission.  - Repeat echo 05/19/16 shows severely reduced systolic function with LVEF 20-25 percent and grade 1 diastolic dysfunction but very poor quality study due to tachycardia and recommended repeat evaluation with [facility]-we'll request. Based on repeat study, consider cardiology consultation.  Hyponatremia Intermittent and stable.  Hypertension Not on medication therapy  Hypothyroidism -continue synthroid  History of partial seizure Keppra held on admission -continue gabapentin  Tobacco abuse Counseling given  Abdominal distension Constipation Treated aggressively with bowel regimen. Multiple BMs on 11/22 with significant improvement in symptoms and abdominal distention. Follow-up chest x-ray shows no fecal impaction. Continue bowel regimen. Abdominal distention has resolved.  History of small cell lung cancer Diagnosed October 2012. Chart review indicates status post palliative radiotherapy, systemic chemotherapy prophylactic cranial irradiation, curative stereotactic radiotherapy. Office visit note dated August 2017 recent CT scan showed no evidence for disease progression septic for evolving radiation changes and masslike consolidation in the right upper lobe and posterior right upper lobe. Notes also indicates this is been evaluated with PET scan in the past and showed no hypermetabolic  activity -Patient will follow-up in 6 months with oncology  Anemia/postop acute blood loss anemia - Hemoglobin dropped from 12.2 preoperatively gradually to 7.6  on 11/26. Suspect this will drop further and patient is symptomatic/week. After discussion with patient and 2 sons, agreeable and transfusing 1 unit PRBC. Follow CBC.  Acute kidney injury May be due to volume depletion. Encouraged oral fluid intake. Resolved.  Oral candidiasis - Start oral fluconazole 1 week. EKG shows normal QTC.   DVT prophylaxis: Lovenox Code Status: Full code Family Communication: discussed in detail with patient's 2 sons. Disposition Plan: DC to SNF possibly in the next 1-2 days.   Consultants:   Orthopedic surgery  Pulmonology  Procedures:   ORIF/IM nail of left hip fracture on 06/18/16.   2-D echo 05/19/16: Study Conclusions  - Left ventricle: The cavity size was normal. There was mild   concentric hypertrophy. Systolic function was severely reduced.   The estimated ejection fraction was in the range of 20% to 25%.   Doppler parameters are consistent with abnormal left ventricular   relaxation (grade 1 diastolic dysfunction).  Impressions:  - Very poor quality study due to tachycardia, poor echo windows and   patient&'s inability to cooperate. Suggest repeat evaluation with   [facility] when patient is able to cooperate with the study and the   heart rate is slower. Direct comparison with the 2016 study shows   similar LV function. In my opinion, the LVEF was <25% on the 2016   study as well.   Antimicrobials:  Azithromycin (11/15)  Levaquin (11/15>> 11/21   oral fluconazole 11/26 >    Subjective: Complaints of mouth pain, decreased appetite and generalized weakness. No BM for the last 2-3 days. Intermittent mild dyspnea.  Objective: Vitals:   05/21/16 0929 05/21/16 0955 05/21/16 1128 05/21/16 1145  BP:  126/71 114/73 124/72  Pulse:  95 100 (!) 104  Resp:  (!) 24 (!)  22   Temp:   97.6 F (36.4 C) 97.6 F (36.4 C)  TempSrc:   Oral Oral  SpO2: 92% 91% 97% 97%  Weight:      Height:      BP 153/95 mmHg.  Intake/Output Summary (Last 24 hours) at 05/21/16 1155 Last data filed at 05/21/16 0600  Gross per 24 hour  Intake              305 ml  Output              400 ml  Net              -95 ml   Filed Weights   05/19/16 0647 05/20/16 0447 05/21/16 0454  Weight: 57.9 kg (127 lb 9.6 oz) 57.6 kg (127 lb) 60.7 kg (133 lb 12.8 oz)    Examination:  General exam: Elderly female, chronically ill looking, lying comfortably propped up in bed and in no obvious distress. Respiratory system: Fair breath sounds bilaterally. No wheezing, rhonchi or crackles appreciated. No increased work of breathing. Cardiovascular system: S1 & S2 heard, RRR. No murmurs, rubs, gallops or clicks. Telemetry: SR in 90's - ST in the 100s. Gastrointestinal system: Abdomen is non distended, soft and nontender. Normal bowel sounds heard. Central nervous system: Alert and oriented. No focal neurological deficits. Extremities: No edema. No calf tenderness. Left hip surgical site dressing intact.  Skin: No cyanosis. No rashes Psychiatry: Judgement and insight appear Impaired. Mood & affect appropriate.  Oral cavity: Thrush + +.   Data Reviewed: I have personally reviewed following labs and imaging studies  CBC:  Recent Labs Lab 05/16/16 1243 05/18/16 0338 05/19/16 1145 05/20/16 0315 05/21/16 0416  WBC  15.4* 15.4* 17.7* 19.1* 13.0*  HGB 11.1* 12.2 11.8* 9.1* 7.6*  HCT 33.3* 37.3 35.6* 27.2* 22.1*  MCV 92.2 94.4 94.7 93.5 92.5  PLT 247 277 264 256 993   Basic Metabolic Panel:  Recent Labs Lab 05/16/16 0306 05/17/16 0520 05/18/16 0338 05/19/16 1145 05/20/16 0315 05/21/16 0416  NA 132* 134* 137  --  134* 136  K 4.2 4.5 4.3  --  4.5 4.4  CL 87* 85* 87*  --  88* 90*  CO2 36* 39* 40*  --  36* 37*  GLUCOSE 140* 123* 161*  --  150* 116*  BUN 21* 21* 24*  --  35* 30*   CREATININE 0.97 0.91 0.98 1.03* 1.26* 1.02*  CALCIUM 7.6* 7.9* 8.0*  --  7.8* 7.8*  MG  --  3.5* 3.5*  --   --   --   PHOS  --  3.1 3.4  --   --   --    GFR: Estimated Creatinine Clearance: 43.7 mL/min (by C-G formula based on SCr of 1.02 mg/dL (H)). Liver Function Tests: No results for input(s): AST, ALT, ALKPHOS, BILITOT, PROT, ALBUMIN in the last 168 hours. No results for input(s): LIPASE, AMYLASE in the last 168 hours. No results for input(s): AMMONIA in the last 168 hours. Coagulation Profile: No results for input(s): INR, PROTIME in the last 168 hours. Cardiac Enzymes: No results for input(s): CKTOTAL, CKMB, CKMBINDEX, TROPONINI in the last 168 hours. BNP (last 3 results) No results for input(s): PROBNP in the last 8760 hours. HbA1C: No results for input(s): HGBA1C in the last 72 hours. CBG:  Recent Labs Lab 05/19/16 1625  GLUCAP 176*   Lipid Profile: No results for input(s): CHOL, HDL, LDLCALC, TRIG, CHOLHDL, LDLDIRECT in the last 72 hours. Thyroid Function Tests: No results for input(s): TSH, T4TOTAL, FREET4, T3FREE, THYROIDAB in the last 72 hours. Anemia Panel: No results for input(s): VITAMINB12, FOLATE, FERRITIN, TIBC, IRON, RETICCTPCT in the last 72 hours. Sepsis Labs: No results for input(s): PROCALCITON, LATICACIDVEN in the last 168 hours.  Recent Results (from the past 240 hour(s))  Culture, Urine     Status: None   Collection Time: 05/15/16 10:22 AM  Result Value Ref Range Status   Specimen Description URINE, RANDOM  Final   Special Requests ADDED [id number]  Final   Culture NO GROWTH  Final   Report Status 05/17/2016 FINAL  Final  MRSA PCR Screening     Status: None   Collection Time: 05/17/16  7:52 PM  Result Value Ref Range Status   MRSA by PCR NEGATIVE NEGATIVE Final    Comment:        The GeneXpert MRSA Assay (FDA approved for NASAL specimens only), is one component of a comprehensive MRSA colonization surveillance program. It is  not intended to diagnose MRSA infection nor to guide or monitor treatment for MRSA infections.          Radiology Studies: Dg Chest Port 1 View  Result Date: 05/19/2016 CLINICAL DATA:  Dyspnea, recent hip surgery. History of right upper lobe lung cancer treated with radiation therapy. EXAM: PORTABLE CHEST 1 VIEW COMPARISON:  05/18/2016 chest radiograph. FINDINGS: Stable cardiomediastinal silhouette with normal heart size and right deviated mediastinum. No pneumothorax. No pleural effusion. Stable volume loss, distortion and parahilar consolidation in the upper right lung. No acute consolidative airspace disease. No pulmonary edema. IMPRESSION: 1. No acute cardiopulmonary disease. 2. 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 Electronically Signed  By: Ilona Sorrel M.D.   On: 05/19/2016 13:02        Scheduled Meds: . arformoterol  15 mcg Nebulization BID  . aspirin EC  81 mg Oral Daily  . budesonide (PULMICORT) nebulizer solution  0.5 mg Nebulization BID  . enoxaparin (LOVENOX) injection  40 mg Subcutaneous Q24H  . fluconazole  50 mg Oral Daily  . gabapentin  300 mg Oral BID  . guaiFENesin  1,200 mg Oral BID  . ipratropium-albuterol  3 mL Nebulization Q6H WA  . levothyroxine  25 mcg Oral QAC breakfast  . magic mouthwash  5 mL Oral QID  . methylPREDNISolone (SOLU-MEDROL) injection  60 mg Intravenous Q12H  . pantoprazole  40 mg Oral Daily  . polyethylene glycol  17 g Oral BID  . senna-docusate  1 tablet Oral BID  . simvastatin  20 mg Oral Daily   Continuous Infusions:    LOS: 11 days     Gaylon Melchor, MD, FACP, FHM. [facility] Pager [phone]  If 7PM-7AM, please contact night-coverage [email or web address] Password [facility] 05/21/2016, 11:55 AM

## 2016-05-21 NOTE — Progress Notes (Signed)
Physical Therapy Treatment Patient Details Name: Janice Daniels MRN: [id number] DOB: 1946-02-27 Today's Date: 05/21/2016    History of Present Illness 70 yo pt with PMH significant for COPD and Stage III [place] lung cancer. She presented 11/15 with persistent shortness of breath and left hip pain after a fall. (She reported she fell out of bed while sleeping). She states about two weeks prior she began have fevers and productive cough. 11/16 surgery for hip delayed due to poor respiratory status. 11/24 Lt hip IM nail.     PT Comments    Patient was able to progress slightly today - able to stand at edge of bed with +2 assistance. Patient with strong posterior lean, limiting ability to progress to ambulation.  Patient will benefit from continued PT to progress mobility. Agree with SNF.   Follow Up Recommendations  SNF;Supervision/Assistance - 24 hour     Equipment Recommendations  None recommended by PT    Recommendations for Other Services OT consult     Precautions / Restrictions Precautions Precautions: Fall Precaution Comments: closely monitor RR, SaO2, HR Restrictions Weight Bearing Restrictions: Yes LLE Weight Bearing: Weight bearing as tolerated    Mobility  Bed Mobility Overal bed mobility: Needs Assistance Bed Mobility: Supine to Sit;Sit to Supine     Supine to sit: Mod assist;+2 for physical assistance Sit to supine: Mod assist;+2 for physical assistance   General bed mobility comments: assist due to weakness; verbal cueing for sequencing/technique  Transfers Overall transfer level: Needs assistance Equipment used: Rolling walker (2 wheeled) Transfers: Sit to/from Stand Sit to Stand: Mod assist;+2 physical assistance         General transfer comment: required assist to power up; verbal and tactile cueing for technique; patient with posterior lean, unable to get body over feet even with assistance  Ambulation/Gait             General Gait  Details: did not attempt seconard to posterior lean   Stairs            Wheelchair Mobility    Modified Rankin (Stroke Patients Only)       Balance Overall balance assessment: Needs assistance Sitting-balance support: Bilateral upper extremity supported Sitting balance-Leahy Scale: Poor Sitting balance - Comments: EOB x 10 minutes   Standing balance support: Bilateral upper extremity supported Standing balance-Leahy Scale: Zero                      Cognition Arousal/Alertness: Awake/alert Behavior During Therapy: Flat affect Overall Cognitive Status: Within Functional Limits for tasks assessed                      Exercises      General Comments        Pertinent Vitals/Pain Pain Assessment: Faces Faces Pain Scale: Hurts little more Pain Location: lt hip Pain Descriptors / Indicators: Discomfort;Operative site guarding Pain Intervention(s): Limited activity within patient's tolerance;Monitored during session;Repositioned    Home Living                      Prior Function            PT Goals (current goals can now be found in the care plan section) Progress towards PT goals: Progressing toward goals    Frequency    Min 3X/week      PT Plan Current plan remains appropriate    Co-evaluation  End of Session Equipment Utilized During Treatment: Gait belt;Oxygen Activity Tolerance: Patient tolerated treatment well Patient left: in bed;with family/visitor present;with call bell/phone within reach     Time: 1200-1223 PT Time Calculation (min) (ACUTE ONLY): 23 min  Charges:  $Therapeutic Activity: 23-37 mins                    G Codes:      Shanna Cisco June 07, 2016, 12:44 PM  June 07, 2016 [place], [place]

## 2016-05-21 NOTE — Progress Notes (Signed)
Inpatient Rehabilitation  Patient was screened by Gunnar Fusi for appropriateness for an Inpatient Acute Rehab consult.  Note that PT is recommending SNF level of rehab and this appears most appropriate.   Carmelia Roller., CCC/SLP Admission Coordinator  [place]  Cell [phone]

## 2016-05-21 NOTE — Progress Notes (Signed)
   Name: Janice Daniels MRN: [id number] DOB: January 30, 1946    ADMISSION DATE:  05/10/2016 CONSULTATION DATE:  11/16 REFERRING MD :  Dr. Lonny Prude [facility]  CHIEF COMPLAINT:  Shortness of Breath  HISTORY OF PRESENT ILLNESS:  70 y/o pt with PMH significant for COPD and Stage III [place] lung cancer. She presents 11/15 with persistent shortness of breath and left hip pain after a fall.  She states about two weeks prior she began have fevers and productive cough.  She talked with PCP over the phone, but nothing was prescribed.  Symptoms improved over the past week, however she is still having SOB.  11/15 she fell at home and presented to ED with hip pain, where she was found to have L hip fracture. She was admitted in this setting also with COPD exacerbation. She was treated with bronchodilators, steroids, and antibiotics with plans for OR on 11/16, however just prior to presenting to OR was found to have increased work of breathing. Surgery postponed and pulmonary consulted.   SUBJECTIVE:   Family in room. Main issue is weakness.  VITAL SIGNS: Temp:  [97.7 F (36.5 C)-98.7 F (37.1 C)] 98.7 F (37.1 C) (11/26 0833) Pulse Rate:  [89-101] 95 (11/26 0955) Resp:  [15-24] 24 (11/26 0955) BP: (112-126)/(67-79) 126/71 (11/26 0955) SpO2:  [90 %-99 %] 91 % (11/26 0955) Weight:  [60.7 kg (133 lb 12.8 oz)] 60.7 kg (133 lb 12.8 oz) (11/26 0454)  PHYSICAL EXAMINATION: General:  Ill appearing elderly female, weak. Its an effort for her to talk Neuro:  Awake, answers questions, generalized weakness.  HEENT:  MM pink/moist, Cardiovascular:  s1 s2 Normal rhythm no MRG Lungs: no wheeze, short sentences but not labored on O2 np Abdomen:  Soft non tender non distended, quiet Musculoskeletal: Muscle bulk normal for age/ gender.  Skin:  Pale/warm, grossly intact   Recent Labs Lab 05/18/16 0338 05/19/16 1145 05/20/16 0315 05/21/16 0416  NA 137  --  134* 136  K 4.3  --  4.5 4.4  CL 87*  --  88* 90*  CO2  40*  --  36* 37*  BUN 24*  --  35* 30*  CREATININE 0.98 1.03* 1.26* 1.02*  GLUCOSE 161*  --  150* 116*    Recent Labs Lab 05/19/16 1145 05/20/16 0315 05/21/16 0416  HGB 11.8* 9.1* 7.6*  HCT 35.6* 27.2* 22.1*  WBC 17.7* 19.1* 13.0*  PLT 264 256 195   Dg Chest Port 1 View  Result Date: 05/19/2016 CLINICAL DATA:  Dyspnea, recent hip surgery. History of right upper lobe lung cancer treated with radiation therapy. EXAM: PORTABLE CHEST 1 VIEW COMPARISON:  05/18/2016 chest radiograph. FINDINGS: Stable cardiomediastinal silhouette with normal heart size and right deviated mediastinum. No pneumothorax. No pleural effusion. Stable volume loss, distortion and parahilar consolidation in the upper right lung. No acute consolidative airspace disease. No pulmonary edema. IMPRESSION: 1. No acute cardiopulmonary disease. 2. 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 Electronically Signed   By: Ilona Sorrel M.D.   On: 05/19/2016 13:02    ASSESSMENT / PLAN:  COPD mixed type- meds appropriate. Weakness impacts ability to cough and to mobilize. Emphasis now is on rehab post surgery. P- continue meds   Acute on chronic resp failure- hypoxia. She will remain O2 dependent P- Continue O2,   CD Mao Lockner, MD 05/21/2016, 10:25 AM [facility] [phone] or if no answer [phone]

## 2016-05-22 ENCOUNTER — Encounter (HOSPITAL_COMMUNITY): Payer: Self-pay | Admitting: Physician Assistant

## 2016-05-22 DIAGNOSIS — J439 Emphysema, unspecified: Secondary | ICD-10-CM | POA: Diagnosis not present

## 2016-05-22 DIAGNOSIS — I5023 Acute on chronic systolic (congestive) heart failure: Secondary | ICD-10-CM | POA: Diagnosis not present

## 2016-05-22 DIAGNOSIS — R000 Tachycardia, unspecified: Secondary | ICD-10-CM

## 2016-05-22 DIAGNOSIS — J9621 Acute and chronic respiratory failure with hypoxia: Secondary | ICD-10-CM | POA: Diagnosis not present

## 2016-05-22 DIAGNOSIS — S72002D Fracture of unspecified part of neck of left femur, subsequent encounter for closed fracture with routine healing: Secondary | ICD-10-CM | POA: Diagnosis not present

## 2016-05-22 LAB — BASIC METABOLIC PANEL
Anion gap: 7 (ref 5–15)
BUN: 34 mg/dL — AB (ref 6–20)
CHLORIDE: 96 mmol/L — AB (ref 101–111)
CO2: 34 mmol/L — ABNORMAL HIGH (ref 22–32)
Calcium: 7.6 mg/dL — ABNORMAL LOW (ref 8.9–10.3)
Creatinine, Ser: 1.03 mg/dL — ABNORMAL HIGH (ref 0.44–1.00)
GFR calc Af Amer: >60 mL/min (ref >60)
GFR, EST NON AFRICAN AMERICAN: 53 mL/min — AB (ref >60)
GLUCOSE: 104 mg/dL — AB (ref 65–99)
POTASSIUM: 4.2 mmol/L (ref 3.5–5.1)
Sodium: 137 mmol/L (ref 135–145)

## 2016-05-22 LAB — CBC
HCT: 24.9 % — ABNORMAL LOW (ref 36.0–46.0)
Hemoglobin: 8.5 g/dL — ABNORMAL LOW (ref 12.0–15.0)
MCH: 30.8 pg (ref 26.0–34.0)
MCHC: 34.1 g/dL (ref 30.0–36.0)
MCV: 90.2 fL (ref 78.0–100.0)
PLATELETS: 193 10*3/uL (ref 150–400)
RBC: 2.76 MIL/uL — AB (ref 3.87–5.11)
RDW: 15 % (ref 11.5–15.5)
WBC: 15.1 10*3/uL — ABNORMAL HIGH (ref 4.0–10.5)

## 2016-05-22 LAB — PREPARE RBC (CROSSMATCH)

## 2016-05-22 LAB — HEMOGLOBIN AND HEMATOCRIT, BLOOD
HCT: 22.8 % — ABNORMAL LOW (ref 36.0–46.0)
HEMOGLOBIN: 7.8 g/dL — AB (ref 12.0–15.0)

## 2016-05-22 MED ORDER — BISACODYL 10 MG RE SUPP
10.0000 mg | Freq: Once | RECTAL | Status: AC
  Administered 2016-05-22: 10 mg via RECTAL
  Filled 2016-05-22: qty 1

## 2016-05-22 MED ORDER — FUROSEMIDE 10 MG/ML IJ SOLN
20.0000 mg | Freq: Once | INTRAMUSCULAR | Status: AC
  Administered 2016-05-22: 20 mg via INTRAVENOUS
  Filled 2016-05-22: qty 2

## 2016-05-22 MED ORDER — LEVETIRACETAM ER 500 MG PO TB24
1000.0000 mg | ORAL_TABLET | Freq: Every day | ORAL | Status: DC
  Filled 2016-05-22: qty 2

## 2016-05-22 MED ORDER — SODIUM CHLORIDE 0.9 % IV SOLN
Freq: Once | INTRAVENOUS | Status: AC
  Administered 2016-05-22: 17:00:00 via INTRAVENOUS

## 2016-05-22 MED ORDER — LEVETIRACETAM ER 500 MG PO TB24
500.0000 mg | ORAL_TABLET | Freq: Every day | ORAL | Status: DC
  Administered 2016-05-22 – 2016-05-25 (×4): 500 mg via ORAL
  Filled 2016-05-22 (×4): qty 1

## 2016-05-22 MED ORDER — LEVETIRACETAM ER 750 MG PO TB24: 750.0000 mg | ORAL_TABLET | Freq: Every day | ORAL | Status: DC

## 2016-05-22 NOTE — Consult Note (Addendum)
Cardiology Consultation Note    Patient ID: Janice Daniels, MRN: [id number], DOB/AGE: 1946-03-22 70 y.o. Admit date: 05/10/2016   Date of Consult: 05/22/2016 Primary Physician: Donnajean Lopes, MD Primary Cardiologist: Dr. Percival Spanish  Chief Complaint: fall, SOB Reason for Consultation: decreased EF, CHF Requesting MD: Dr. Tyrell Antonio  HPI: Janice Daniels is a 70 y.o. female with history of small cell lung CA (tx with radiation, chemotherapy), COPD, chronic systolic CHF, chronic sinus tachycardia, depression, HTN, hypothyroidism, tobacco abuse, seizure disorder, ?stroke per chart whom we are asked to see for decreased EF. Prior echo 10/2014 showed mild LVH, EF 35-40%, mild MR, mildly reduced RV function. Per Dr. Rosezella Florida remote notes, ischemic workup was previously deferred given her ongoing oncologic issues. Holter 11/2014 to assess elevated HR showed sinus tach, occ PVCs/couplets, occ NSVT, and nocturnal bradycardia. She presented to [facility] on 05/10/16 with left hip pain after a mechanical fall out of bed as well as worsening SOB. She was found to have a hip fracture requiring surgery, but her pulm status needed tuning up first. She was treated for COPD exacerbation prior to surgery as well as diuretics for CHF. On 05/19/16 her pulm status was felt optimized enough to undergo treatment of subtrochanteric fracture with intramedullary implant. Post-op course notable for post-op ABL anemia (with Hgb 12.2->gradual downtrend to 7.6, requiring unit of PRBC) & oral candidiasis. 2D echo was obtained 05/19/16 which showed poor quality study due to tachycardia, poor echo windows and patient's inability to coorperate, consider repeat eval with Definity when  patient is able to cooperate with the study and the heart rate is slower, direct comparison to 2016 shows similar LV function (per reader, "In my opinion, the LVEF was <25% on the 2016 study as well."). Hgb since txf 9.3->8.5, WBC 15.1, BUN 34, Cr  1.03, Na 137, CO2 34. Weight extremely variable in Epic - 126 on adm, peak 134 (then immediately down to 111?), most recently 119 (down from 133 the day before?) - per nursing, weights have been EXTREMELY difficult as patient requires max assist to get out of bed. Net OP -6.2L.   The patient's affect is somewhat guarded. She states her breathing is back to baseline but she just feels "tired" - physically fatigued. Denies any chest pain, palpitations, nausea, vomiting, fever. She says she quit smoking upon arrival to the hospital.   Past Medical History:  Diagnosis Date  . Arthritis    "back" (05/10/2016)  . Chronic bronchitis ([place])   . Chronic systolic CHF (congestive heart failure) ([place])   . COPD (chronic obstructive pulmonary disease) ([place])   . Depression   . Fall from bed 05/09/2016  . GERD (gastroesophageal reflux disease)   . Heart murmur   . History of chemotherapy    carboplatin and etoposide  . History of radiation therapy 06/05/11 to 07/18/11   lung  . History of radiation therapy 09/13/11 to 09/26/11   brain  . Hx of radiation therapy 10/03/12 - 10/09/12   RUL lung  . Hypertension   . Hypothyroid   . Low back pain   . Lung cancer ([place]) 05/09/2011   RUL  . Lung cancer, upper lobe ([place]) 09/02/2012   rul  . Seizure disorder ([place])   . Shingles   . Sinus tachycardia   . Stroke [facility])    "they said ~ 2016; they don't really know when I had it" (05/10/2016)      Surgical History:  Past Surgical History:  Procedure Laterality Date  .  FIBEROPTIC BRONCHOSCOPY Right 05/03/2011   endobronchial biopsy  . INTRAMEDULLARY (IM) NAIL INTERTROCHANTERIC Left 05/19/2016   Procedure: INTRAMEDULLARY (IM) NAIL LEFT INTERTROCHANTERIC HIP;  Surgeon: Leandrew Koyanagi, MD;  Location: [facility];  Service: Orthopedics;  Laterality: Left;  . TONSILLECTOMY    . TUBAL LIGATION  1984  . VAGINAL HYSTERECTOMY  1986     Home Meds: Prior to Admission medications   Medication Sig Start Date End Date Taking?  Authorizing Provider  ADVAIR DISKUS 250-50 MCG/DOSE AEPB Inhale 1 puff into the lungs 2 (two) times daily. 05/11/15  Yes Historical Provider, MD  albuterol (VENTOLIN HFA) 108 (90 BASE) MCG/ACT inhaler Inhale 2 puffs into the lungs every 6 (six) hours as needed for wheezing or shortness of breath.    Yes Historical Provider, MD  aspirin EC 81 MG EC tablet Take 1 tablet (81 mg total) by mouth daily. 10/30/14  Yes Shanker Kristeen Mans, MD  gabapentin (NEURONTIN) 300 MG capsule Take 300 mg by mouth 2 (two) times daily. 01/27/16  Yes Historical Provider, MD  ipratropium (ATROVENT HFA) 17 MCG/ACT inhaler Inhale 2 puffs into the lungs every 6 (six) hours.   Yes Historical Provider, MD  ipratropium-albuterol (DUONEB) 0.5-2.5 (3) MG/3ML SOLN Take 3 mLs by nebulization every 6 (six) hours. 10/30/14  Yes Shanker Kristeen Mans, MD  Levetiracetam (KEPPRA XR) 750 MG TB24 Take 1 tablet (750 mg total) by mouth at bedtime. 11/10/15  Yes Marcial Pacas, MD  levothyroxine (SYNTHROID, LEVOTHROID) 25 MCG tablet Take 25 mcg by mouth daily. 12/23/15  Yes Historical Provider, MD  omeprazole (PRILOSEC) 20 MG capsule Take 20 mg by mouth daily. 12/31/15  Yes Historical Provider, MD  simvastatin (ZOCOR) 20 MG tablet Take 1 tablet (20 mg total) by mouth daily. PLEASE CONTACT OFFICE FOR ADDITIONAL REFILLS 02/02/16  Yes Minus Breeding, MD  spironolactone (ALDACTONE) 25 MG tablet Take 0.5 tablets (12.5 mg total) by mouth daily. PLEASE CONTACT OFFICE FOR ADDITIONAL REFILLS 02/21/16  Yes Minus Breeding, MD  Vitamin D, Ergocalciferol, (DRISDOL) 50000 UNITS CAPS capsule Take 50,000 Units by mouth every 7 (seven) days. Monday   Yes Historical Provider, MD  enoxaparin (LOVENOX) 40 MG/0.4ML injection Inject 0.4 mLs (40 mg total) into the skin daily. 05/19/16   Leandrew Koyanagi, MD  levothyroxine (SYNTHROID, LEVOTHROID) 75 MCG tablet Take 75 mcg by mouth daily. 07/23/15   Historical Provider, MD  oxyCODONE-acetaminophen (PERCOCET) 5-325 MG tablet Take 1-2 tablets by  mouth every 4 (four) hours as needed for severe pain. 05/19/16   Leandrew Koyanagi, MD    Inpatient Medications:  . arformoterol  15 mcg Nebulization BID  . budesonide (PULMICORT) nebulizer solution  0.5 mg Nebulization BID  . fluconazole  50 mg Oral Daily  . gabapentin  300 mg Oral BID  . guaiFENesin  1,200 mg Oral BID  . ipratropium-albuterol  3 mL Nebulization Q6H WA  . levothyroxine  25 mcg Oral QAC breakfast  . magic mouthwash  5 mL Oral QID  . methylPREDNISolone (SOLU-MEDROL) injection  40 mg Intravenous Q12H  . pantoprazole  40 mg Oral Daily  . polyethylene glycol  17 g Oral BID  . senna-docusate  1 tablet Oral BID  . simvastatin  20 mg Oral Daily     Allergies:  Allergies  Allergen Reactions  . Codeine Other (See Comments)    Elevated pulse    Social History   Social History  . Marital status: Married    Spouse name: [name]  . Number of children: 5  .  Years of education: 27   Occupational History  . retired [employer]arry manager    Social History Main Topics  . Smoking status: Current Some Day Smoker    Packs/day: 0.50    Years: 55.00    Types: Cigarettes    Last attempt to quit: 11/22/2014  . Smokeless tobacco: Never Used  . Alcohol use No  . Drug use: No  . Sexual activity: Not on file   Other Topics Concern  . Not on file   Social History Narrative   She is married to [place] and had 5 sons.  She had a daughter who died at 42 months.  Her son, Heron Sabins, died at age 56 from alcoholism and pancreas disease.  Her son, Merry Proud, is 37 was born 76 and is alive and well.  Her son, Clair Gulling, is 25, born 72, and has bipolar disorder.  Her son, Jenny Reichmann, is 32, born 51, and has kidney disease.  Her son, Corene Cornea, is age 95, born 81, and is in the [place].    Right-handed.   2-4 cups caffeine per day.     Family History  Problem Relation Age of Onset  . Esophageal cancer Father   . Hypothyroidism Father   . Cancer Father     esophageal  . Congestive Heart Failure Father     . Heart disease Mother   . Hyperlipidemia Mother   . Hypertension Mother     heart disease  . Other Brother     low back pain  . Cancer Brother     esophageal  . Other Brother     hypochondriasis  . Other Brother     suicide  . Esophageal cancer Brother   . Cervical cancer Sister   . Cancer Sister     cervical     Review of Systems: All other systems reviewed and are otherwise negative except as noted above.  Labs: No results for input(s): CKTOTAL, CKMB, TROPONINI in the last 72 hours. Lab Results  Component Value Date   WBC 15.1 (H) 05/22/2016   HGB 8.5 (L) 05/22/2016   HCT 24.9 (L) 05/22/2016   MCV 90.2 05/22/2016   PLT 193 05/22/2016     Recent Labs Lab 05/22/16 0549  NA 137  K 4.2  CL 96*  CO2 34*  BUN 34*  CREATININE 1.03*  CALCIUM 7.6*  GLUCOSE 104*   Lab Results  Component Value Date   CHOL 232 (H) 10/29/2014   HDL 61 10/29/2014   LDLCALC 151 (H) 10/29/2014   TRIG 100 10/29/2014   No results found for: DDIMER  Radiology/Studies:  Dg Chest 2 View  Result Date: 05/10/2016 CLINICAL DATA:  Status post falling out of bed yesterday striking the left hip. The patient reports worsening shortness of breath and nonproductive cough. History of COPD. Current smoker. History of lung malignancy. EXAM: CHEST  2 VIEW COMPARISON:  CT scan of the chest of February 07, 2016 FINDINGS: The lungs are hyperinflated. There are chronic changes in the right suprahilar region not greatly changed from the previous study. There is no alveolar infiltrate. There is no pleural effusion. The heart and pulmonary vascularity are normal. There is mild shift of mediastinum toward the right. The observed bony thorax exhibits no acute abnormality. IMPRESSION: COPD. Chronic right upper lobe changes consistent with known malignancy. There has not been dramatic radiographic change since the CT scan of February 07, 2016 but direct comparison of today's chest x-ray with the previous CT scan is  of  limited utility. Repeat chest CT scanning is recommended. COPD.  No CHF.  The observed bony thorax is unremarkable. Electronically Signed   By: David  Martinique M.D.   On: 05/10/2016 11:59   Ct Hip Left Wo Contrast  Result Date: 05/10/2016 CLINICAL DATA:  Proximal left femur fracture secondary to a fall yesterday. EXAM: CT OF THE LEFT HIP WITHOUT CONTRAST TECHNIQUE: Multidetector CT imaging of the left hip was performed according to the standard protocol. Multiplanar CT image reconstructions were also generated. COMPARISON:  Radiographs dated 05/10/2016 FINDINGS: Bones/Joint/Cartilage There is an impacted slightly comminuted intertrochanteric and low left femoral neck fracture with minimal angulation and minimal displacement. The visualized pelvic bones are intact. Joint spaces well-preserved. Adjacent soft tissues are normal. IMPRESSION: Impacted slightly comminuted intertrochanteric and low femoral neck fracture on the left. Electronically Signed   By: Lorriane Shire M.D.   On: 05/10/2016 13:29   Dg Chest Port 1 View  Result Date: 05/19/2016 CLINICAL DATA:  Dyspnea, recent hip surgery. History of right upper lobe lung cancer treated with radiation therapy. EXAM: PORTABLE CHEST 1 VIEW COMPARISON:  05/18/2016 chest radiograph. FINDINGS: Stable cardiomediastinal silhouette with normal heart size and right deviated mediastinum. No pneumothorax. No pleural effusion. Stable volume loss, distortion and parahilar consolidation in the upper right lung. No acute consolidative airspace disease. No pulmonary edema. IMPRESSION: 1. No acute cardiopulmonary disease. 2. 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 Electronically Signed   By: Ilona Sorrel M.D.   On: 05/19/2016 13:02   Dg Chest Port 1 View  Result Date: 05/18/2016 CLINICAL DATA:  Shortness of breath.  Acute respiratory failure. EXAM: PORTABLE CHEST 1 VIEW COMPARISON:  05/17/2016 and 05/10/2016 and CT scan of the chest 02/07/2016  FINDINGS: Heart size and pulmonary vascularity are normal. Scarring and a masslike density in the right upper lung zone and right hilar region, unchanged. Left lung is clear. No infiltrates or effusions. IMPRESSION: Stable appearance of the chest since the prior study. Probable scarring in the right upper lung zone and right perihilar region as described. Electronically Signed   By: Lorriane Shire M.D.   On: 05/18/2016 09:24   Dg Chest Port 1 View  Result Date: 05/17/2016 CLINICAL DATA:  Short of breath EXAM: PORTABLE CHEST 1 VIEW COMPARISON:  05/14/2016 FINDINGS: The heart is normal in size. Persistent volume loss in the right lung is stable. Hazy opacity towards the right apex is unchanged. There is linear scarring in the right upper lung. Left lung is clear. No pneumothorax or pleural effusion. IMPRESSION: Stable changes related of malignancy and volume loss in the right lung. Electronically Signed   By: Marybelle Killings M.D.   On: 05/17/2016 14:30   Dg Chest Port 1 View  Result Date: 05/14/2016 CLINICAL DATA:  Pulmonary mass, shortness of Breath EXAM: PORTABLE CHEST 1 VIEW COMPARISON:  05/10/2016.  Chest CT 01/28/2016 FINDINGS: Chronic changes noted in the right upper lobe, stable compatible with known malignancy. Left lung is clear. Heart is borderline in size. No visible effusions or acute bony abnormality. IMPRESSION: Chronic right upper lobe changes compatible with known malignancy. No acute findings. Electronically Signed   By: Rolm Baptise M.D.   On: 05/14/2016 11:05   Dg Abd Portable 1v  Result Date: 05/18/2016 CLINICAL DATA:  Fecal impaction. EXAM: PORTABLE ABDOMEN - 1 VIEW COMPARISON:  05/16/2016 FINDINGS: There is fluid and gas slightly distended ascending and transverse portions of the colon. No visible fecal impaction. No dilated small bowel.  Angulated comminuted intertrochanteric fracture of the proximal left femur is noted. The angulation is new since the prior study of 05/10/2016.  IMPRESSION: Slightly distended transverse portion of the colon. No visible fecal impaction. New angulation of the proximal left femur fracture. Electronically Signed   By: Lorriane Shire M.D.   On: 05/18/2016 09:26   Dg Abd Portable 1v  Result Date: 05/16/2016 CLINICAL DATA:  Abdominal distention.  Epigastric pain. EXAM: PORTABLE ABDOMEN - 1 VIEW COMPARISON:  Two days ago FINDINGS: Decreased stool retention and diminished gaseous distention of colon. No small bowel obstruction pattern. No concerning mass effect or gas collection. EKG leads create artifact over the abdomen. Known proximal left femur fracture.  Osteopenia. IMPRESSION: Stool retention and gaseous colon distention are improved from 2 days prior. Electronically Signed   By: Monte Fantasia M.D.   On: 05/16/2016 11:17   Dg Abd Portable 1v  Result Date: 05/14/2016 CLINICAL DATA:  Hemoptysis.  Pulmonary mass. EXAM: PORTABLE ABDOMEN - 1 VIEW COMPARISON:  None. FINDINGS: Large stool burden in the right colon. Gaseous distention of the colon. No evidence of small bowel obstruction, free air organomegaly. IMPRESSION: Large stool burden in the right colon with gaseous distention of the colon. This may reflect adynamic ileus. Electronically Signed   By: Rolm Baptise M.D.   On: 05/14/2016 11:04   Dg C-arm 1-60 Min  Result Date: 05/19/2016 CLINICAL DATA:  Internal fixation of intertrochanteric left femur fracture. EXAM: LEFT FEMUR 2 VIEWS; DG C-ARM 61-120 MIN COMPARISON:  05/10/2016 CT and radiographs FINDINGS: Four intraoperative spot films are submitted postoperatively for interpretation. An intra medullary rod nail and screws are present traversing an intertrochanteric left femur fracture, in near-anatomic alignment. No definite complicating features are noted. IMPRESSION: Internal fixation of intertrochanteric left femur fracture. Electronically Signed   By: Margarette Canada M.D.   On: 05/19/2016 10:37   Dg Hip Unilat W Or Wo Pelvis 2-3 Views  Left  Result Date: 05/10/2016 CLINICAL DATA:  Left hip pain after fall yesterday. EXAM: DG HIP (WITH OR WITHOUT PELVIS) 2-3V LEFT COMPARISON:  None. FINDINGS: There appears to be cortical disruption involving the greater trochanter concerning for mildly displaced fracture. The left femoral head and neck appear intact. No dislocation is noted. IMPRESSION: Probable mildly displaced fracture involving greater trochanter of proximal left femur. CT scan of the left hip is recommended for further evaluation. Electronically Signed   By: Marijo Conception, M.D.   On: 05/10/2016 12:01   Dg Femur Min 2 Views Left  Result Date: 05/19/2016 CLINICAL DATA:  Internal fixation of intertrochanteric left femur fracture. EXAM: LEFT FEMUR 2 VIEWS; DG C-ARM 61-120 MIN COMPARISON:  05/10/2016 CT and radiographs FINDINGS: Four intraoperative spot films are submitted postoperatively for interpretation. An intra medullary rod nail and screws are present traversing an intertrochanteric left femur fracture, in near-anatomic alignment. No definite complicating features are noted. IMPRESSION: Internal fixation of intertrochanteric left femur fracture. Electronically Signed   By: Margarette Canada M.D.   On: 05/19/2016 10:37    Wt Readings from Last 3 Encounters:  05/22/16 119 lb 14.4 oz (54.4 kg)  02/14/16 124 lb 3.2 oz (56.3 kg)  11/10/15 121 lb 4 oz (55 kg)    EKG: NSR HR upper limits of normal, nonspecific ST-T changes  Physical Exam: Blood pressure 115/72, pulse (!) 101, temperature 97.6 F (36.4 C), temperature source Oral, resp. rate (!) 21, height '5\' 4"'$  (1.626 m), weight 119 lb 14.4 oz (54.4 kg), SpO2 97 %. Body  mass index is 20.58 kg/m. General: Frail elderly WF, in no acute distress, fatigued appearing. Lying 20 degrees in bed in no acute distress Head: Normocephalic, atraumatic, sclera non-icteric, no xanthomas, nares are without discharge.  Neck: Negative for carotid bruits. JVD not elevated. Lungs: Coarse,  diffusely diminished. No wheezing or rhonchi. Breathing is unlabored. Heart: RRR with S1 S2. No murmurs, rubs, or gallops appreciated. Abdomen: Soft, non-tender, non-distended with normoactive bowel sounds. No hepatomegaly. No rebound/guarding. No obvious abdominal masses. Msk:  Strength and tone appear normal for age. Extremities: No clubbing or cyanosis. No edema.  Distal pedal pulses are 2+ and equal bilaterally. Neuro: Alert and oriented X 3. No facial asymmetry. No focal deficit. Moves all extremities spontaneously. Psych:  Responds to questions appropriately with a flat affect.     Assessment and Plan  55F with history of small cell lung CA (tx with radiation, chemotherapy - also with a lung nodule that was suspected NSCLC), COPD, ongoing tobacco abuse, chronic systolic CHF, chronic sinus tachycardia, depression, HTN, hypothyroidism, seizure disorder, ?stroke per chart whom we are asked to see for decreased EF.  1. Mechanical fall/hip fracture - s/p surgery 05/19/16. Will need extensive therapy as she is very weak per nursing.  2. Acute respiratory failure with hypoxia likely secondary to COPD and acute on chronic systolic CHF - weights are inconsistent due to different types of weights obtained (bed weight/ standing wt) as nursing staff is having a difficult time getting patient up as she is so weak. She appears sufficiently diuresed. Volume status now appears euvolemic so would hold off on further diuresis. Per echo reader, EF actually appears similar to the one in 2016. Do not feel repeat echocardiogram is warranted as it will not change management - will d/c order. Cannot be too aggressive with meds as her BP tends to be on the softer side. Would avoid BB given severe COPD this admission. Would hold off on ACEI given recent AKI but we can consider this if BP and Cr remain stable. Corlanor may also be something to consider going forward.   3. Cardiomyopathy - previous ischemic eval deferred  given frailty and comorbidities. She's not describing any chest pain. ASA on hold given her ongoing anemia.  4. Acute kidney injury - improved.  5. ABL anemia - will need to continue to follow given her recurrent downtrend.  6. Chronic sinus tach - baseline for patient. Brief run of PAT on telemetry, asymptomatic. Follow.  Signed, Charlie Pitter PA-C 05/22/2016, 2:10 PM Pager: [phone]  I have personally seen and examined this patient with Melina Copa, PA-C. I agree with the assessment and plan as outlined above. My exam shows a thin female in NAD. BJ:YNWGN, no murmurs. Lungs: poor air movement in all lung fields with wheezes. Ext: no edema. Labs reviewed. EKG reviewed by me.  She has lung cancer and COPD. She is known to have a cardiomyopathy. Echo in 2016 with LVEF that appears to be around 25%. No ischemic workup was done then given her overall poor functional status due to her severe lung disease. Echo now is overall unchanged. She has been diuresed during this admission, currently on hold due to her renal insufficiency. I would avoid beta blockers given her lung disease. Consider Ace-inh therapy when renal function normalizes. She is known to have sinus tachycardia. This is unchanged this admission.  She is chronically ill appearing and her overall prognosis seems quite poor with known lung cancer, severe COPD and ongoing tobacco abuse.  I would not change her therapy at this point. Resume aldactone when renal function is back to baseline. No ischemic workup planned.   Will sign off. Please call with questions.   Lauree Chandler 05/22/2016 2:43 PM

## 2016-05-22 NOTE — Progress Notes (Signed)
I evaluated the patient at bedside today.  Her pain is minimal.  LLE has expected postop bruising.  Thigh is very soft and there isn't any active bleeding.  No evidence of active bleeding based on my exam.  Stable from ortho stand point.    Azucena Cecil, MD [place] 10:51 PM

## 2016-05-22 NOTE — Progress Notes (Signed)
PROGRESS NOTE    Janice Daniels  JQZ:[id number] DOB: 02/13/1946 DOA: 05/10/2016 PCP: Donnajean Lopes, MD   Brief Narrative: Janice Daniels is a 70 y.o. 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 She presented with a COPD exacerbation and left hip fracture. CCM was consulted. Respiratory status was optimized. She underwent surgery on 05/19/16.  Assessment & Plan:   Principal Problem:   Closed fracture of left hip (HCC) Active Problems:   Essential hypertension   COPD (chronic obstructive pulmonary disease) with emphysema (HCC)   GERD   Lung cancer (HCC)   COPD exacerbation (HCC)   Hypothyroidism   Acute on chronic respiratory failure with hypoxia (HCC)   Other specified hypothyroidism   Tobacco abuse   Chronic systolic heart failure (HCC)   Hyponatremia   Acute respiratory distress   AKI (acute kidney injury) (HCC)   Postoperative anemia due to acute blood loss   Closed hip fracture, left, status post ORIF - Orthopedics was consulted. Surgery was postponed until her respiratory status was stabilized. She then underwent ORIF/IM nail on 05/19/16. Management per orthopedics: Weightbearing as tolerated.  Will hold Lovenox for DVT prophylaxis due to hematoma surgery site and dropping on hb. .  Acute respiratory failure with hypoxia secondary to COPD exacerbation and decompensated CHF - PCCM was consulted and assisted in management of her COPD exacerbation. She was transferred to stepdown unit preoperatively for close monitoring and management.  - She was treated with oxygen, bronchodilator nebulizations, Lasix, increasing Solu-Medrol, continue budesonide/Brovana and levofloxacin. - Postoperatively had episode of worsening dyspnea and hypoxia. This resolved after a nebulization treatment.  Since then she has done well without any worsening. -Continue to reduce IV Solu-Medrol  - Repeat chest x-ray shows radiation fibrosis and right upper lung but otherwise no acute findings.    Acute on Chronic systolic heart failure Last EF of 35-40%, mild LVH, diffuse hypokinesis. Clinically compensated. Encouraged oral fluid intake. Has not been on Lasix since 11/22. - - 6.9 L since admission.  - Repeat echo 05/19/16 shows severely reduced systolic function with LVEF 20-25 percent and grade 1 diastolic dysfunction but very poor quality study due to tachycardia and recommended repeat evaluation with Definity- -ECHO ordered. Cardiology consulted.    Hyponatremia Intermittent and stable.  Hypertension Not on medication therapy  Hypothyroidism -continue synthroid  History of partial seizure -resume keppra. Unclear why this was stopped.  -continue gabapentin  Tobacco abuse Counseling given  Abdominal distension Constipation Treated aggressively with bowel regimen. Multiple BMs on 11/22 with significant improvement in symptoms and abdominal distention. Follow-up chest x-ray shows no fecal impaction. Continue bowel regimen. Abdominal distention has resolved. Will give dulcolax suppository.   History of small cell lung cancer Diagnosed October 2012. Chart review indicates status post palliative radiotherapy, systemic chemotherapy prophylactic cranial irradiation, curative stereotactic radiotherapy. Office visit note dated August 2017 recent CT scan showed no evidence for disease progression septic for evolving radiation changes and masslike consolidation in the right upper lobe and posterior right upper lobe. Notes also indicates this is been evaluated with PET scan in the past and showed no hypermetabolic activity -Patient will follow-up in 6 months with oncology  Anemia/postop acute blood loss anemia - Hemoglobin dropped from 12.2 preoperatively gradually to 7.6 on 11/26. Suspect this  will drop further and patient is symptomatic/week.  -received one unit PRBC.  Hb trending down, will need another unit PRBC.   Acute kidney injury May be due to volume depletion. Encouraged oral fluid intake. Resolved.  Oral candidiasis - Start oral fluconazole 1 week. EKG shows normal QTC.   DVT prophylaxis: Lovenox Code Status: Full code Family Communication: none at bedside.  Disposition Plan: DC to SNF possibly in the next 1-2 days.   Consultants:   Orthopedic surgery  Pulmonology  Procedures:   ORIF/IM nail of left hip fracture on 06/18/16.   2-D echo 05/19/16: Study Conclusions  - Left ventricle: The cavity size was normal. There was mild   concentric hypertrophy. Systolic function was severely reduced.   The estimated ejection fraction was in the range of 20% to 25%.   Doppler parameters are consistent with abnormal left ventricular   relaxation (grade 1 diastolic dysfunction).  Impressions:  - Very poor quality study due to tachycardia, poor echo windows and   patient&'s inability to cooperate. Suggest repeat evaluation with   Definity when patient is able to cooperate with the study and the   heart rate is slower. Direct comparison with the 2016 study shows   similar LV function. In my opinion, the LVEF was <25% on the 2016   study as well.   Antimicrobials:  Azithromycin (11/15)  Levaquin (11/15>> 11/21   oral fluconazole 11/26 >    Subjective: Feels she is breathing the same.   Objective: Vitals:   05/22/16 0018 05/22/16 0614 05/22/16 0803 05/22/16 0847  BP: 105/72 126/72  121/81  Pulse: 96 (!) 106  (!) 105  Resp: 16 (!) 23  19  Temp: 97.9 F (36.6 C) 97.4 F (36.3 C)  97.8 F (36.6 C)  TempSrc: Axillary Oral  Oral  SpO2: 95% 96% 94% 96%  Weight:  54.4 kg (119 lb 14.4 oz)    Height:      BP 153/95 mmHg.  Intake/Output Summary (Last 24 hours) at 05/22/16 0856 Last data filed at 05/22/16 0700  Gross per 24 hour  Intake              1282 ml  Output              575 ml  Net              707 ml   Filed Weights   05/20/16 0447 05/21/16 0454 05/22/16 0614  Weight: 57.6 kg (127 lb) 60.7 kg (133 lb 12.8 oz) 54.4 kg (119 lb 14.4 oz)    Examination:  General exam: Elderly female, chronically ill looking, lying comfortably propped up in bed and in no obvious distress. Respiratory system: Fair breath sounds bilaterally. No wheezing, rhonchi or crackles appreciated. No increased work of breathing. Cardiovascular system: S1 & S2 heard, RRR. No murmurs, rubs, gallops or clicks. Telemetry: SR in 90's - ST in the 100s. Gastrointestinal system: Abdomen is non distended, soft and nontender. Normal bowel sounds heard. Central nervous system: Alert and oriented. No focal neurological deficits. Extremities: No edema. No calf tenderness. Left hip surgical site dressing intact.  Skin: No cyanosis. No rashes Psychiatry: Judgement and insight appear Impaired. Mood & affect appropriate.  Oral cavity: Thrush + +.   Data Reviewed: I have personally reviewed following labs and imaging studies  CBC:  Recent Labs Lab 05/19/16 1145 05/20/16 0315 05/21/16 0416 05/21/16 1423 05/22/16 0549  WBC 17.7* 19.1* 13.0* 15.0* 15.1*  HGB 11.8* 9.1* 7.6* 9.3* 8.5*  HCT 35.6* 27.2* 22.1* 26.9* 24.9*  MCV 94.7 93.5 92.5 90.0  90.2  PLT 264 256 195 192 956   Basic Metabolic Panel:  Recent Labs Lab 05/17/16 0520 05/18/16 0338 05/19/16 1145 05/20/16 0315 05/21/16 0416 05/22/16 0549  NA 134* 137  --  134* 136 137  K 4.5 4.3  --  4.5 4.4 4.2  CL 85* 87*  --  88* 90* 96*  CO2 39* 40*  --  36* 37* 34*  GLUCOSE 123* 161*  --  150* 116* 104*  BUN 21* 24*  --  35* 30* 34*  CREATININE 0.91 0.98 1.03* 1.26* 1.02* 1.03*  CALCIUM 7.9* 8.0*  --  7.8* 7.8* 7.6*  MG 3.5* 3.5*  --   --   --   --   PHOS 3.1 3.4  --   --   --   --    GFR: Estimated Creatinine Clearance: 43 mL/min (by C-G formula based on SCr of 1.03 mg/dL (H)). Liver Function  Tests: No results for input(s): AST, ALT, ALKPHOS, BILITOT, PROT, ALBUMIN in the last 168 hours. No results for input(s): LIPASE, AMYLASE in the last 168 hours. No results for input(s): AMMONIA in the last 168 hours. Coagulation Profile: No results for input(s): INR, PROTIME in the last 168 hours. Cardiac Enzymes: No results for input(s): CKTOTAL, CKMB, CKMBINDEX, TROPONINI in the last 168 hours. BNP (last 3 results) No results for input(s): PROBNP in the last 8760 hours. HbA1C: No results for input(s): HGBA1C in the last 72 hours. CBG:  Recent Labs Lab 05/19/16 1625  GLUCAP 176*   Lipid Profile: No results for input(s): CHOL, HDL, LDLCALC, TRIG, CHOLHDL, LDLDIRECT in the last 72 hours. Thyroid Function Tests: No results for input(s): TSH, T4TOTAL, FREET4, T3FREE, THYROIDAB in the last 72 hours. Anemia Panel: No results for input(s): VITAMINB12, FOLATE, FERRITIN, TIBC, IRON, RETICCTPCT in the last 72 hours. Sepsis Labs: No results for input(s): PROCALCITON, LATICACIDVEN in the last 168 hours.  Recent Results (from the past 240 hour(s))  Culture, Urine     Status: None   Collection Time: 05/15/16 10:22 AM  Result Value Ref Range Status   Specimen Description URINE, RANDOM  Final   Special Requests ADDED [id number]  Final   Culture NO GROWTH  Final   Report Status 05/17/2016 FINAL  Final  MRSA PCR Screening     Status: None   Collection Time: 05/17/16  7:52 PM  Result Value Ref Range Status   MRSA by PCR NEGATIVE NEGATIVE Final    Comment:        The GeneXpert MRSA Assay (FDA approved for NASAL specimens only), is one component of a comprehensive MRSA colonization surveillance program. It is not intended to diagnose MRSA infection nor to guide or monitor treatment for MRSA infections.          Radiology Studies: No results found.      Scheduled Meds: . arformoterol  15 mcg Nebulization BID  . bisacodyl  10 mg Rectal Once  . budesonide (PULMICORT)  nebulizer solution  0.5 mg Nebulization BID  . fluconazole  50 mg Oral Daily  . gabapentin  300 mg Oral BID  . guaiFENesin  1,200 mg Oral BID  . ipratropium-albuterol  3 mL Nebulization Q6H WA  . levothyroxine  25 mcg Oral QAC breakfast  . magic mouthwash  5 mL Oral QID  . methylPREDNISolone (SOLU-MEDROL) injection  40 mg Intravenous Q12H  . pantoprazole  40 mg Oral Daily  . polyethylene glycol  17 g Oral BID  . senna-docusate  1 tablet  Oral BID  . simvastatin  20 mg Oral Daily   Continuous Infusions:    LOS: 12 days     Elmarie Shiley, MD.  [facility] Pager [phone]  If 7PM-7AM, please contact night-coverage [email or web address] Password [email or web address] 05/22/2016, 8:56 AM

## 2016-05-22 NOTE — NC FL2 (Signed)
[place] LEVEL OF CARE SCREENING TOOL     IDENTIFICATION  Patient Name: Janice Daniels Birthdate: 1946-02-17 Sex: female Admission Date (Current Location): 05/10/2016  [facility] and [place] Number:  Herbalist and Address:  [facility] [place], [place] [address] [place], [place] [address]      Provider Number: [id number]  Attending Physician Name and Address:  Elmarie Shiley, MD  Relative Name and Phone Number:       Current Level of Care: Hospital Recommended Level of Care: [place] Prior Approval Number:    Date Approved/Denied:   PASRR Number: [id number]  Discharge Plan: SNF    Current Diagnoses: Patient Active Problem List   Diagnosis Date Noted  . Postoperative anemia due to acute blood loss   . AKI (acute kidney injury) ([place])   . Closed fracture of left hip ([place]) 05/10/2016  . Hyponatremia 05/10/2016  . Hip fx ([place]) 05/10/2016  . Acute respiratory distress 05/10/2016  . 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 Partial seizure ([place]) 11/10/2015  . Constipation 05/18/2015  . Chronic systolic heart failure ([place]) 11/06/2014  . Acute on chronic systolic CHF (congestive heart failure) ([place])   . HCAP (healthcare-associated pneumonia)   . Acute renal failure syndrome ([place])   . Other specified hypothyroidism   . Cancer of upper lobe of right lung ([place])   . Tobacco abuse   . Acute respiratory failure with hypoxia ([place]) 10/26/2014  . Hypothyroidism 10/26/2014  . Acute on chronic respiratory failure with hypoxia ([place]) 10/26/2014  . COPD exacerbation ([place]) 10/25/2014  . Lung cancer ([place]) 05/09/2011  . THROMBOCYTOSIS 08/29/2010  . GERD 08/12/2010  . OSTEOPENIA 03/16/2009  . COLONIC POLYPS 03/11/2009  . HYPOKALEMIA 07/07/2008  . DEPRESSION 03/15/2007  . Essential hypertension 03/15/2007  . COPD (chronic obstructive pulmonary disease) with emphysema ([place]) 03/15/2007  . OSTEOARTHRITIS, FINGERS 03/15/2007     Orientation RESPIRATION BLADDER Height & Weight     Self, Time, Situation, Place  O2 (2L) Continent Weight: 54.4 kg (119 lb 14.4 oz) Height:  '5\' 4"'$  (162.6 cm)  BEHAVIORAL SYMPTOMS/MOOD NEUROLOGICAL BOWEL NUTRITION STATUS   (NONE)  (None) Continent Diet (Heart Healthy)  AMBULATORY STATUS COMMUNICATION OF NEEDS Skin   Extensive Assist Verbally Surgical wounds (hip left)                       Personal Care Assistance Level of Assistance  Bathing, Feeding, Dressing Bathing Assistance: Limited assistance Feeding assistance: Independent Dressing Assistance: Limited assistance     Functional Limitations Info  Sight, Hearing, Speech Sight Info: Adequate Hearing Info: Adequate Speech Info: Adequate    SPECIAL CARE FACTORS FREQUENCY  PT (By licensed PT), OT (By licensed OT)     PT Frequency: 5/week OT Frequency: 5/week            Contractures Contractures Info: Not present    Additional Factors Info  Code Status, Allergies Code Status Info: Full Code Allergies Info: Heart Healthy           Current Medications (05/22/2016):  This is the current hospital active medication list Current Facility-Administered Medications  Medication Dose Route Frequency Provider Last Rate Last Dose  . acetaminophen (TYLENOL) tablet 650 mg  650 mg Oral Q6H PRN Naiping Ephriam Jenkins, MD       Or  . acetaminophen (TYLENOL) suppository 650 mg  650 mg Rectal Q6H PRN Naiping Ephriam Jenkins, MD      . albuterol (  PROVENTIL) (2.5 MG/3ML) 0.083% nebulizer solution 2.5 mg  2.5 mg Nebulization Q2H PRN Corey Harold, NP   2.5 mg at 05/19/16 1606  . alum & mag hydroxide-simeth (MAALOX/MYLANTA) 200-200-20 MG/5ML suspension 30 mL  30 mL Oral Q4H PRN Naiping Ephriam Jenkins, MD      . arformoterol [facility]) nebulizer solution 15 mcg  15 mcg Nebulization BID Corey Harold, NP   15 mcg at 05/21/16 2106  . aspirin EC tablet 81 mg  81 mg Oral Daily Waldemar Dickens, MD   81 mg at 05/21/16 1013  . budesonide (PULMICORT) nebulizer  solution 0.5 mg  0.5 mg Nebulization BID Corey Harold, NP   0.5 mg at 05/21/16 2106  . chlorpheniramine-HYDROcodone (TUSSIONEX) 10-8 MG/5ML suspension 5 mL  5 mL Oral Q12H PRN Rhetta Mura Schorr, NP   5 mL at 05/13/16 1856  . fluconazole (DIFLUCAN) tablet 50 mg  50 mg Oral Daily Modena Jansky, MD   50 mg at 05/21/16 1258  . gabapentin (NEURONTIN) capsule 300 mg  300 mg Oral BID Lezlie Octave Black, NP   300 mg at 05/21/16 2100  . guaiFENesin (MUCINEX) 12 hr tablet 1,200 mg  1,200 mg Oral BID Marijean Heath, NP   1,200 mg at 05/21/16 2101  . HYDROcodone-acetaminophen (NORCO/VICODIN) 5-325 MG per tablet 1-2 tablet  1-2 tablet Oral Q6H PRN Leandrew Koyanagi, MD   1 tablet at 05/22/16 0540  . ipratropium-albuterol (DUONEB) 0.5-2.5 (3) MG/3ML nebulizer solution 3 mL  3 mL Nebulization Q6H WA Modena Jansky, MD   3 mL at 05/21/16 2107  . levothyroxine (SYNTHROID, LEVOTHROID) tablet 25 mcg  25 mcg Oral QAC breakfast Radene Gunning, NP   25 mcg at 05/21/16 1013  . magic mouthwash  5 mL Oral QID Gardiner Barefoot, NP   5 mL at 05/21/16 1831  . menthol-cetylpyridinium (CEPACOL) lozenge 3 mg  1 lozenge Oral PRN Naiping Ephriam Jenkins, MD       Or  . phenol (CHLORASEPTIC) mouth spray 1 spray  1 spray Mouth/Throat PRN Naiping Ephriam Jenkins, MD      . methocarbamol (ROBAXIN) tablet 500 mg  500 mg Oral Q6H PRN Leandrew Koyanagi, MD   500 mg at 05/21/16 1402   Or  . methocarbamol (ROBAXIN) 500 mg in dextrose 5 % 50 mL IVPB  500 mg Intravenous Q6H PRN Naiping Ephriam Jenkins, MD      . methylPREDNISolone sodium succinate (SOLU-MEDROL) 40 mg/mL injection 40 mg  40 mg Intravenous Q12H Modena Jansky, MD   40 mg at 05/22/16 0629  . metoCLOPramide (REGLAN) tablet 5-10 mg  5-10 mg Oral Q8H PRN Naiping Ephriam Jenkins, MD       Or  . metoCLOPramide (REGLAN) injection 5-10 mg  5-10 mg Intravenous Q8H PRN Naiping Ephriam Jenkins, MD      . morphine 2 MG/ML injection 0.5 mg  0.5 mg Intravenous Q2H PRN Leandrew Koyanagi, MD   0.5 mg at 05/21/16 1012  . ondansetron (ZOFRAN)  tablet 4 mg  4 mg Oral Q6H PRN Naiping Ephriam Jenkins, MD       Or  . ondansetron [facility]) injection 4 mg  4 mg Intravenous Q6H PRN Naiping Ephriam Jenkins, MD      . oxyCODONE (Oxy IR/ROXICODONE) immediate release tablet 5-10 mg  5-10 mg Oral Q4H PRN Leandrew Koyanagi, MD   5 mg at 05/20/16 5465  . pantoprazole (PROTONIX) EC tablet 40 mg  40 mg Oral  Daily Radene Gunning, NP   40 mg at 05/21/16 1012  . phenol (CHLORASEPTIC) mouth spray 1 spray  1 spray Mouth/Throat PRN Mariel Aloe, MD   1 spray at 05/21/16 0422  . polyethylene glycol (MIRALAX / GLYCOLAX) packet 17 g  17 g Oral BID Mariel Aloe, MD   17 g at 05/21/16 2101  . senna-docusate (Senokot-S) tablet 1 tablet  1 tablet Oral BID Mariel Aloe, MD   1 tablet at 05/21/16 2100  . simvastatin (ZOCOR) tablet 20 mg  20 mg Oral Daily Lezlie Octave Black, NP   20 mg at 05/21/16 1013  . sodium phosphate (FLEET) 7-19 GM/118ML enema 1 enema  1 enema Rectal Daily PRN Modena Jansky, MD         Discharge Medications: Please see discharge summary for a list of discharge medications.  Relevant Imaging Results:  Relevant Lab Results:   Additional Information SSN: [id number]  Rigoberto Noel, LCSW

## 2016-05-22 NOTE — Care Management Important Message (Signed)
Important Message  Patient Details  Name: Janice Daniels MRN: [id number] Date of Birth: 1945/11/22   Medicare Important Message Given:  Yes    Nathen May 05/22/2016, 11:45 AM

## 2016-05-22 NOTE — Progress Notes (Addendum)
Pt's left hip has some bruising. Pt's foam dressings are marked with a moderate amount of brown dried blood. Orthopedist made aware. New order received to change pt's dressing. Will cont to monitor pt.

## 2016-05-22 NOTE — Progress Notes (Signed)
Physical Therapy Treatment Patient Details Name: Janice Daniels MRN: [id number] DOB: [birth year] Today's Date: 05/22/2016    History of Present Illness 70 yo pt with PMH significant for COPD and Stage III NSC lung cancer. She presented 11/15 with persistent shortness of breath and left hip pain after a fall. (She reported she fell out of bed while sleeping). She states about two weeks prior she began have fevers and productive cough. 11/16 surgery for hip delayed due to poor respiratory status. 11/24 Lt hip IM nail.     PT Comments    Pt transferred bed to [facility], BSC to chair, and then back to bed. She was able to perform initial transfers with +2 mod A but fatigued greatly throughout session and needed tot A +2 by last transfer. Pt limited by decreased ability to use LLE but also generalized weakness RLE and UE's. PT will continue to follow.   Follow Up Recommendations  SNF;Supervision/Assistance - 24 hour     Equipment Recommendations  None recommended by PT    Recommendations for Other Services OT consult     Precautions / Restrictions Precautions Precautions: Fall Precaution Comments: closely monitor RR, SaO2, HR Restrictions Weight Bearing Restrictions: No LLE Weight Bearing: Weight bearing as tolerated    Mobility  Bed Mobility Overal bed mobility: Needs Assistance;+2 for physical assistance Bed Mobility: Supine to Sit;Sit to Supine Rolling: Mod assist   Supine to sit: Mod assist;+2 for physical assistance Sit to supine: Total assist;+2 for physical assistance (worn out after multiple transfers)   General bed mobility comments: pt not painful with supine to sit but left hip was painful when rolling for bowel clean up after return to bed  Transfers Overall transfer level: Needs assistance Equipment used: Rolling walker (2 wheeled) Transfers: Sit to/from [facility] Sit to Stand: Mod assist;+2 physical assistance Stand pivot transfers: Mod  assist;+2 physical assistance;Total assist       General transfer comment: Mod A +2 sit<>stand from bed on intial stand, Mod A 2 sit<>stand on stand from 3n1; max A +2 sit<>stand from recliner to get further cleaned up; total A +2 sit<>stand and pivot from recliner >bed. Pt participation decreased with each transfer due to fatigue. Used RW for first 2 stands but no AD used for last back to bed due to pt fatigue  Ambulation/Gait             General Gait Details: unable due to weakness and posture in standing   Stairs            Wheelchair Mobility    Modified Rankin (Stroke Patients Only)       Balance Overall balance assessment: Needs assistance;History of Falls Sitting-balance support: Bilateral upper extremity supported;Feet supported Sitting balance-Leahy Scale: Poor Sitting balance - Comments: reliant on Bil UE support Postural control: Posterior lean;Right lateral lean Standing balance support: Bilateral upper extremity supported Standing balance-Leahy Scale: Zero Standing balance comment: pt stands with left knee extended and minimal wt on left side. She has UE weakness that makes it hard for her to rely on UE's on RW in standing, also has difficulty fully extending right knee to help her stand                    Cognition Arousal/Alertness: Lethargic Behavior During Therapy: Flat affect Overall Cognitive Status: Within Functional Limits for tasks assessed Area of Impairment: Memory     Memory: Decreased short-term memory  Exercises General Exercises - Lower Extremity Ankle Circles/Pumps: AROM;Both;10 reps;Seated Quad Sets: AROM;Both;5 reps;Supine Long Arc Quad: AROM;10 reps;Seated;Left    General Comments General comments (skin integrity, edema, etc.): niece present. Pt had received a suppository before session, hence multiple episodes of BM and clean up      Pertinent Vitals/Pain Pain Assessment: Faces Faces Pain Scale:  Hurts even more Pain Location: left hip with bed mobility Pain Descriptors / Indicators: Operative site guarding Pain Intervention(s): Limited activity within patient's tolerance;Monitored during session;Premedicated before session;Repositioned    Home Living Family/patient expects to be discharged to:: Skilled nursing facility                    Prior Function            PT Goals (current goals can now be found in the care plan section) Acute Rehab PT Goals Patient Stated Goal: to go home and go shopping PT Goal Formulation: With patient Time For Goal Achievement: 06/03/16 Potential to Achieve Goals: Fair Progress towards PT goals: Progressing toward goals    Frequency    Min 3X/week      PT Plan Current plan remains appropriate    Co-evaluation PT/OT/SLP Co-Evaluation/Treatment: Yes Reason for Co-Treatment: For patient/therapist safety PT goals addressed during session: Mobility/safety with mobility;Balance;Proper use of DME;Strengthening/ROM OT goals addressed during session: Strengthening/ROM;ADL's and self-care     End of Session Equipment Utilized During Treatment: Gait belt;Oxygen Activity Tolerance: Patient tolerated treatment well Patient left: in bed;with family/visitor present;with call bell/phone within reach     Time: 1000-1030 PT Time Calculation (min) (ACUTE ONLY): 30 min  Charges:  $Therapeutic Activity: 23-37 mins                    G Codes:     Leighton Roach, PT  Acute Rehab Services  [place] 05/22/2016, 1:27 PM

## 2016-05-22 NOTE — Evaluation (Signed)
Occupational Therapy Evaluation Patient Details Name: Janice Daniels MRN: [id number] DOB: 25-Apr-1946 Today's Date: 05/22/2016    History of Present Illness 70 yo pt with PMH significant for COPD and Stage III [place] lung cancer. She presented 11/15 with persistent shortness of breath and left hip pain after a fall. (She reported she fell out of bed while sleeping). She states about two weeks prior she began have fevers and productive cough. 11/16 surgery for hip delayed due to poor respiratory status. 11/24 Lt hip IM nail.    Clinical Impression   This 70 yo female admitted with above presents to acute OT with deficits below (see OT problem list) thus affecting her PLOF of being totally independent pta. She will benefit from acute OT with follow up OT at SNF to work back towards PLOF.    Follow Up Recommendations  SNF;Supervision/Assistance - 24 hour    Equipment Recommendations  Other (comment) (TBD at next venue)       Precautions / Restrictions Precautions Precautions: Fall Precaution Comments: closely monitor RR, SaO2, HR Restrictions Weight Bearing Restrictions: No LLE Weight Bearing: Weight bearing as tolerated      Mobility Bed Mobility Overal bed mobility: Needs Assistance;+2 for physical assistance Bed Mobility: Supine to Sit;Sit to Supine     Supine to sit: Mod assist;+2 for physical assistance Sit to supine: Total assist;+2 for physical assistance (pt completely worn out from session)   General bed mobility comments: assist due to weakness; verbal cueing for sequencing/technique  Transfers Overall transfer level: Needs assistance Equipment used: Rolling walker (2 wheeled) Transfers: Sit to/from Stand Sit to Stand: Mod assist;+2 physical assistance         General transfer comment: Mod A +2 sit<>stand from bed on intial stand, Mod A 2 sit<>stand on stand from 3n1; max A +2 sit<>stand from recliner to get furhter cleaned up; total A +2 sit<>stand and  pivot from recliner >bed    Balance Overall balance assessment: Needs assistance Sitting-balance support: Bilateral upper extremity supported;Feet supported Sitting balance-Leahy Scale: Poor Sitting balance - Comments: reliant on Bil UE support Postural control: Posterior lean;Right lateral lean Standing balance support: Bilateral upper extremity supported Standing balance-Leahy Scale: Zero Standing balance comment: reliant on Bil UE support and additional A as well                            ADL Overall ADL's : Needs assistance/impaired Eating/Feeding: Independent   Grooming: Wash/dry face;Sitting   Upper Body Bathing: Set up;Supervision/ safety;Sitting (supported)   Lower Body Bathing: Total assistance (+t mod A sit<>stand)   Upper Body Dressing : Maximal assistance (supported sitting)   Lower Body Dressing: Total assistance (+2 mod A sit<>stand)   Toilet Transfer: Maximal assistance;+2 for physical assistance;Stand-pivot;RW Toilet Transfer Details (indicate cue type and reason): on 2nd transfer from recliner back to bed pt total A +2 with use pad due to pt was so worn out from session and needed to be cleaned up Toileting- Clothing Manipulation and Hygiene: Total assistance (mod A +2 sit<>stand)         General ADL Comments: Pt eduated on using of inspirometer and flutter valve (10 reps each) and educated dtr that best practice is 10 reps of each for every hour awake               Pertinent Vitals/Pain Pain Assessment: No/denies pain     Hand Dominance Right   Extremity/Trunk Assessment Upper Extremity  Assessment Upper Extremity Assessment: Generalized weakness           Communication Communication Communication: No difficulties   Cognition Arousal/Alertness: Lethargic Behavior During Therapy: Flat affect Overall Cognitive Status: Within Functional Limits for tasks assessed                                Home Living  Family/patient expects to be discharged to:: Skilled nursing facility                                                 OT Problem List: Decreased strength;Decreased range of motion;Decreased activity tolerance;Impaired balance (sitting and/or standing);Cardiopulmonary status limiting activity;Decreased safety awareness;Decreased knowledge of use of DME or AE   OT Treatment/Interventions: Self-care/ADL training;Balance training;Therapeutic activities;DME and/or AE instruction;Patient/family education    OT Goals(Current goals can be found in the care plan section) Acute Rehab OT Goals Patient Stated Goal: to go home and go shopping OT Goal Formulation: With patient Time For Goal Achievement: 06/05/16 Potential to Achieve Goals: Good  OT Frequency: Min 2X/week   Barriers to D/C: Decreased caregiver support          Co-evaluation PT/OT/SLP Co-Evaluation/Treatment: Yes Reason for Co-Treatment: For patient/therapist safety   OT goals addressed during session: Strengthening/ROM;ADL's and self-care      End of Session Equipment Utilized During Treatment: Gait belt;Rolling walker Nurse Communication: Mobility status  Activity Tolerance: Patient limited by fatigue Patient left: in chair;with call bell/phone within reach;with chair alarm set   Time: [phone] OT Time Calculation (min): 90 min Charges:  OT General Charges $OT Visit: 1 Procedure OT Treatments $Self Care/Home Management : 38-52 mins  Almon Register [phone] 05/22/2016, 1:14 PM

## 2016-05-22 NOTE — Progress Notes (Signed)
   Name: Janice Daniels MRN: [id number] DOB: 1945/08/15    ADMISSION DATE:  05/10/2016 CONSULTATION DATE:  11/16 REFERRING MD :  Dr. Lonny Prude TRH  CHIEF COMPLAINT:  Shortness of Breath  HISTORY OF PRESENT ILLNESS:  70 y/o pt with PMH significant for COPD and Stage III [place] lung cancer. She presents 11/15 with persistent shortness of breath and left hip pain after a fall.  She states about two weeks prior she began have fevers and productive cough.  She talked with PCP over the phone, but nothing was prescribed.  Symptoms improved over the past week, however she is still having SOB.  11/15 she fell at home and presented to ED with hip pain, where she was found to have L hip fracture. She was admitted in this setting also with COPD exacerbation. She was treated with bronchodilators, steroids, and antibiotics with plans for OR on 11/16, however just prior to presenting to OR was found to have increased work of breathing. Surgery postponed and pulmonary consulted.   SUBJECTIVE:    Was able to get out of bed to chair. Remains very weak Breathing okay  VITAL SIGNS: Temp:  [97.3 F (36.3 C)-98.4 F (36.9 C)] 97.3 F (36.3 C) (11/27 1642) Pulse Rate:  [96-107] 102 (11/27 1642) Resp:  [16-25] 20 (11/27 1642) BP: (105-131)/(67-84) 125/79 (11/27 1642) SpO2:  [94 %-97 %] 97 % (11/27 1642) Weight:  [119 lb 14.4 oz (54.4 kg)] 119 lb 14.4 oz (54.4 kg) (11/27 4114)  PHYSICAL EXAMINATION: General:  Ill appearing elderly female, weak. No accessory muscle usage Neuro:  Awake, answers questions, generalized weakness.  HEENT:  MM pink/moist, Cardiovascular:  s1 s2 Normal rhythm no MRG Lungs: no wheeze, short sentences but not labored on O2 np Abdomen:  Soft non tender non distended, quiet Musculoskeletal: Muscle bulk normal for age/ gender.  Skin:  Pale/warm, grossly intact   Recent Labs Lab 05/20/16 0315 05/21/16 0416 05/22/16 0549  NA 134* 136 137  K 4.5 4.4 4.2  CL 88* 90* 96*  CO2  36* 37* 34*  BUN 35* 30* 34*  CREATININE 1.26* 1.02* 1.03*  GLUCOSE 150* 116* 104*    Recent Labs Lab 05/21/16 0416 05/21/16 1423 05/22/16 0549 05/22/16 1453  HGB 7.6* 9.3* 8.5* 7.8*  HCT 22.1* 26.9* 24.9* 22.8*  WBC 13.0* 15.0* 15.1*  --   PLT 195 192 193  --    No results found.  ASSESSMENT / PLAN:  COPD mixed type- meds appropriate. Weakness impacts ability to cough and to mobilize. Emphasis now is on rehab post surgery. P- continue Budesonide, formoterol and DuoNeb's   Acute on chronic resp failure- hypoxia. She will remain O2 dependent P- Continue O2,  Careful with pain meds  Kara Mead MD. FCCP.  Pulmonary & Critical care Pager [phone] If no response call [phone]    05/22/2016, 4:49 PM

## 2016-05-22 NOTE — Care Management Important Message (Signed)
Important Message  Patient Details  Name: Janice Daniels MRN: [id number] Date of Birth: [birth year]   Medicare Important Message Given:  Yes    Nathen May 05/22/2016, 11:44 AM

## 2016-05-23 DIAGNOSIS — J9621 Acute and chronic respiratory failure with hypoxia: Secondary | ICD-10-CM | POA: Diagnosis not present

## 2016-05-23 DIAGNOSIS — J441 Chronic obstructive pulmonary disease with (acute) exacerbation: Secondary | ICD-10-CM | POA: Diagnosis not present

## 2016-05-23 DIAGNOSIS — S72002D Fracture of unspecified part of neck of left femur, subsequent encounter for closed fracture with routine healing: Secondary | ICD-10-CM | POA: Diagnosis not present

## 2016-05-23 LAB — TYPE AND SCREEN
ABO/RH(D): O NEG
ANTIBODY SCREEN: NEGATIVE
Unit division: 0
Unit division: 0

## 2016-05-23 LAB — CBC
HEMATOCRIT: 27.8 % — AB (ref 36.0–46.0)
HEMOGLOBIN: 9.6 g/dL — AB (ref 12.0–15.0)
MCH: 31.1 pg (ref 26.0–34.0)
MCHC: 34.5 g/dL (ref 30.0–36.0)
MCV: 90 fL (ref 78.0–100.0)
Platelets: 172 10*3/uL (ref 150–400)
RBC: 3.09 MIL/uL — ABNORMAL LOW (ref 3.87–5.11)
RDW: 14.8 % (ref 11.5–15.5)
WBC: 13.9 10*3/uL — ABNORMAL HIGH (ref 4.0–10.5)

## 2016-05-23 LAB — BASIC METABOLIC PANEL
Anion gap: 7 (ref 5–15)
BUN: 33 mg/dL — ABNORMAL HIGH (ref 6–20)
CALCIUM: 8.1 mg/dL — AB (ref 8.9–10.3)
CHLORIDE: 98 mmol/L — AB (ref 101–111)
CO2: 33 mmol/L — AB (ref 22–32)
Creatinine, Ser: 1.09 mg/dL — ABNORMAL HIGH (ref 0.44–1.00)
GFR calc Af Amer: 58 mL/min — ABNORMAL LOW (ref >60)
GFR calc non Af Amer: 50 mL/min — ABNORMAL LOW (ref >60)
GLUCOSE: 142 mg/dL — AB (ref 65–99)
POTASSIUM: 4.6 mmol/L (ref 3.5–5.1)
Sodium: 138 mmol/L (ref 135–145)

## 2016-05-23 MED ORDER — IPRATROPIUM-ALBUTEROL 0.5-2.5 (3) MG/3ML IN SOLN
3.0000 mL | Freq: Three times a day (TID) | RESPIRATORY_TRACT | Status: DC
  Administered 2016-05-24: 3 mL via RESPIRATORY_TRACT
  Filled 2016-05-23 (×2): qty 3

## 2016-05-23 MED ORDER — NYSTATIN 100000 UNIT/ML MT SUSP
5.0000 mL | Freq: Four times a day (QID) | OROMUCOSAL | Status: DC
  Administered 2016-05-24 – 2016-05-25 (×5): 500000 [IU] via ORAL
  Filled 2016-05-23 (×5): qty 5

## 2016-05-23 NOTE — Progress Notes (Signed)
PROGRESS NOTE    Janice Daniels  ZHG:[id number] DOB: 10/17/1945 DOA: 05/10/2016 PCP: Donnajean Lopes, MD   Brief Narrative: Janice Daniels is a 70 y.o. 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 She presented with a COPD exacerbation and left hip fracture. CCM was consulted. Respiratory status was optimized. She underwent surgery on 05/19/16.  Patient has required 2 units PRBC during admission, for post operative blood loss. Hb 11-27 decreased to 7.8. She received one unit PRBC 11-27. Hb this morning is at 9. Patient has hematoma at left hips surgery site. Repeat Hb in am to follow stability. Lovenox for DVT prophylaxis on hold./   Second limited factor for discharge is respiratory status. Patient continue to have bilateral wheezing. Will continue with Solumedrol today.   Assessment & Plan:   Principal Problem:   Closed fracture of left hip (HCC) Active Problems:   Essential hypertension   COPD (chronic obstructive pulmonary disease) with emphysema (HCC)   GERD   Lung cancer (HCC)   COPD exacerbation (HCC)   Hypothyroidism   Acute on chronic respiratory failure with hypoxia (HCC)   Other specified hypothyroidism   Tobacco abuse   Chronic systolic heart failure (HCC)   Hyponatremia   Acute respiratory distress   AKI (acute kidney injury) (HCC)   Postoperative anemia due to acute blood loss   Sinus tachycardia   Closed hip fracture, left, status post ORIF - Orthopedics was consulted. Surgery was postponed until her respiratory status was stabilized. She then underwent ORIF/IM nail on 05/19/16. Management per orthopedics: Weightbearing as tolerated.  Will hold Lovenox for DVT prophylaxis due to hematoma surgery site and dropping on hb.  Received one unit PRBC 11-27. Hb today at 9.    Repeat hb in am.   Acute respiratory failure with hypoxia secondary to COPD exacerbation and decompensated CHF - PCCM was consulted and assisted in management of her COPD exacerbation. She was transferred to stepdown unit preoperatively for close monitoring and management.  - She was treated with oxygen, bronchodilator nebulizations, Lasix, increasing Solu-Medrol, continue budesonide/Brovana and levofloxacin. - Postoperatively had episode of worsening dyspnea and hypoxia. This resolved after a nebulization treatment. Since then she has done well without any worsening. -Continue to reduce IV Solu-Medrol  - Repeat chest x-ray shows radiation fibrosis and right upper lung but otherwise no acute findings.    Acute on Chronic systolic heart failure Last EF of 35-40%, mild LVH, diffuse hypokinesis. Clinically compensated. Encouraged oral fluid intake. Has not been on Lasix since 11/22. - - 6.9 L since admission.  - Repeat echo 05/19/16 shows severely reduced systolic function with LVEF 20-25 percent and grade 1 diastolic dysfunction but very poor quality study due to tachycardia and recommended repeat evaluation with Definity- -ECHO ordered. Cardiology consulted. No further evaluation per cardio. Start ACE when renal function stabilized.    Hyponatremia Intermittent and stable.  Hypertension Not on medication therapy  Hypothyroidism -continue synthroid  History of partial seizure -resume keppra. Unclear why this was stopped.  -continue gabapentin  Tobacco abuse Counseling given  Abdominal distension Constipation Treated aggressively with bowel regimen. Multiple BMs on 11/22 with significant improvement in symptoms and abdominal distention. Follow-up chest x-ray shows no fecal impaction. Continue bowel regimen. Abdominal distention has resolved. Had multiple BM 11-27  History of small cell lung cancer Diagnosed October 2012. Chart review indicates  status post palliative  radiotherapy, systemic chemotherapy prophylactic cranial irradiation, curative stereotactic radiotherapy. Office visit note dated August 2017 recent CT scan showed no evidence for disease progression septic for evolving radiation changes and masslike consolidation in the right upper lobe and posterior right upper lobe. Notes also indicates this is been evaluated with PET scan in the past and showed no hypermetabolic activity -Patient will follow-up in 6 months with oncology  Anemia/postop acute blood loss anemia - Hemoglobin dropped from 12.2 preoperatively gradually to 7.6 on 11/26. Suspect this will drop further and patient is symptomatic/week.  -received one unit PRBC.  -Hb trending down, S/p  PRBC 11-27.   Acute kidney injury May be due to volume depletion. Encouraged oral fluid intake. Resolved.  Oral candidiasis - Start oral fluconazole 1 week. EKG shows normal QTC.   DVT prophylaxis: Lovenox Code Status: Full code Family Communication: none at bedside.  Disposition Plan: DC to SNF possibly in the next 1-2 days.   Consultants:   Orthopedic surgery  Pulmonology  Procedures:   ORIF/IM nail of left hip fracture on 06/18/16.   2-D echo 05/19/16: Study Conclusions  - Left ventricle: The cavity size was normal. There was mild   concentric hypertrophy. Systolic function was severely reduced.   The estimated ejection fraction was in the range of 20% to 25%.   Doppler parameters are consistent with abnormal left ventricular   relaxation (grade 1 diastolic dysfunction).  Impressions:  - Very poor quality study due to tachycardia, poor echo windows and   patient&'s inability to cooperate. Suggest repeat evaluation with   Definity when patient is able to cooperate with the study and the   heart rate is slower. Direct comparison with the 2016 study shows   similar LV function. In my opinion, the LVEF was <25% on the 2016   study as  well.   Antimicrobials:  Azithromycin (11/15)  Levaquin (11/15>> 11/21   oral fluconazole 11/26 >    Subjective: Feels she is breathing the same. Complaining of sore throat.   Objective: Vitals:   05/23/16 0756 05/23/16 0801 05/23/16 1154 05/23/16 1439  BP:   110/68   Pulse:   (!) 104   Resp:   (!) 21   Temp:   98.4 F (36.9 C)   TempSrc:   Oral   SpO2: 95% 96% 96% 95%  Weight:      Height:      BP 153/95 mmHg.  Intake/Output Summary (Last 24 hours) at 05/23/16 1557 Last data filed at 05/23/16 0800  Gross per 24 hour  Intake              803 ml  Output                0 ml  Net              803 ml   Filed Weights   05/21/16 0454 05/22/16 0614 05/23/16 0700  Weight: 60.7 kg (133 lb 12.8 oz) 54.4 kg (119 lb 14.4 oz) 56.2 kg (123 lb 14.4 oz)    Examination:  General exam: Elderly female, chronically ill looking,  Respiratory system: Fair breath sounds bilaterally. Bilateral wheezing, rhonchi or crackles appreciated. No increased work of breathing. Cardiovascular system: S1 & S2 heard, RRR. No murmurs, rubs, gallops or clicks. Telemetry:  Gastrointestinal system: Abdomen is non distended, soft and nontender. Normal bowel sounds heard. Central nervous system: Alert and oriented. No focal neurological deficits. Extremities: No edema. No calf tenderness. Left  hip surgical site dressing intact. Hematoma left leg  Skin: No cyanosis. No rashes Psychiatry: Judgement and insight appear Impaired. Mood & affect appropriate.  Oral cavity: Thrush + +.   Data Reviewed: I have personally reviewed following labs and imaging studies  CBC:  Recent Labs Lab 05/20/16 0315 05/21/16 0416 05/21/16 1423 05/22/16 0549 05/22/16 1453 05/23/16 0402  WBC 19.1* 13.0* 15.0* 15.1*  --  13.9*  HGB 9.1* 7.6* 9.3* 8.5* 7.8* 9.6*  HCT 27.2* 22.1* 26.9* 24.9* 22.8* 27.8*  MCV 93.5 92.5 90.0 90.2  --  90.0  PLT 256 195 192 193  --  295   Basic Metabolic Panel:  Recent Labs Lab  05/17/16 0520 05/18/16 0338 05/19/16 1145 05/20/16 0315 05/21/16 0416 05/22/16 0549 05/23/16 0402  NA 134* 137  --  134* 136 137 138  K 4.5 4.3  --  4.5 4.4 4.2 4.6  CL 85* 87*  --  88* 90* 96* 98*  CO2 39* 40*  --  36* 37* 34* 33*  GLUCOSE 123* 161*  --  150* 116* 104* 142*  BUN 21* 24*  --  35* 30* 34* 33*  CREATININE 0.91 0.98 1.03* 1.26* 1.02* 1.03* 1.09*  CALCIUM 7.9* 8.0*  --  7.8* 7.8* 7.6* 8.1*  MG 3.5* 3.5*  --   --   --   --   --   PHOS 3.1 3.4  --   --   --   --   --    GFR: Estimated Creatinine Clearance: 40.9 mL/min (by C-G formula based on SCr of 1.09 mg/dL (H)). Liver Function Tests: No results for input(s): AST, ALT, ALKPHOS, BILITOT, PROT, ALBUMIN in the last 168 hours. No results for input(s): LIPASE, AMYLASE in the last 168 hours. No results for input(s): AMMONIA in the last 168 hours. Coagulation Profile: No results for input(s): INR, PROTIME in the last 168 hours. Cardiac Enzymes: No results for input(s): CKTOTAL, CKMB, CKMBINDEX, TROPONINI in the last 168 hours. BNP (last 3 results) No results for input(s): PROBNP in the last 8760 hours. HbA1C: No results for input(s): HGBA1C in the last 72 hours. CBG:  Recent Labs Lab 05/19/16 1625  GLUCAP 176*   Lipid Profile: No results for input(s): CHOL, HDL, LDLCALC, TRIG, CHOLHDL, LDLDIRECT in the last 72 hours. Thyroid Function Tests: No results for input(s): TSH, T4TOTAL, FREET4, T3FREE, THYROIDAB in the last 72 hours. Anemia Panel: No results for input(s): VITAMINB12, FOLATE, FERRITIN, TIBC, IRON, RETICCTPCT in the last 72 hours. Sepsis Labs: No results for input(s): PROCALCITON, LATICACIDVEN in the last 168 hours.  Recent Results (from the past 240 hour(s))  Culture, Urine     Status: None   Collection Time: 05/15/16 10:22 AM  Result Value Ref Range Status   Specimen Description URINE, RANDOM  Final   Special Requests ADDED [id number]  Final   Culture NO GROWTH  Final   Report Status 05/17/2016  FINAL  Final  MRSA PCR Screening     Status: None   Collection Time: 05/17/16  7:52 PM  Result Value Ref Range Status   MRSA by PCR NEGATIVE NEGATIVE Final    Comment:        The GeneXpert MRSA Assay (FDA approved for NASAL specimens only), is one component of a comprehensive MRSA colonization surveillance program. It is not intended to diagnose MRSA infection nor to guide or monitor treatment for MRSA infections.          Radiology Studies: No results found.  Scheduled Meds: . arformoterol  15 mcg Nebulization BID  . budesonide (PULMICORT) nebulizer solution  0.5 mg Nebulization BID  . fluconazole  50 mg Oral Daily  . gabapentin  300 mg Oral BID  . guaiFENesin  1,200 mg Oral BID  . ipratropium-albuterol  3 mL Nebulization Q6H WA  . levETIRAcetam  500 mg Oral Daily  . levothyroxine  25 mcg Oral QAC breakfast  . magic mouthwash  5 mL Oral QID  . methylPREDNISolone (SOLU-MEDROL) injection  40 mg Intravenous Q12H  . pantoprazole  40 mg Oral Daily  . polyethylene glycol  17 g Oral BID  . senna-docusate  1 tablet Oral BID  . simvastatin  20 mg Oral Daily   Continuous Infusions:    LOS: 13 days     Elmarie Shiley, MD.  [facility] Pager [phone]  If 7PM-7AM, please contact night-coverage [email or web address] Password [facility] 05/23/2016, 3:57 PM

## 2016-05-23 NOTE — Progress Notes (Signed)
   Name: Janice Daniels MRN: [id number] DOB: 09/17/1945    ADMISSION DATE:  05/10/2016 CONSULTATION DATE:  11/16 REFERRING MD :  Dr. Lonny Prude TRH  CHIEF COMPLAINT:  Shortness of Breath  HISTORY OF PRESENT ILLNESS:  70 y/o pt with PMH significant for COPD and Stage III [place] lung cancer. She presents 11/15 with persistent shortness of breath and left hip pain after a fall.  She states about two weeks prior she began have fevers and productive cough.  She talked with PCP over the phone, but nothing was prescribed.  Symptoms improved over the past week, however she is still having SOB.  11/15 she fell at home and presented to ED with hip pain, where she was found to have L hip fracture. She was admitted in this setting also with COPD exacerbation. She was treated with bronchodilators, steroids, and antibiotics with plans for OR on 11/16, however just prior to presenting to OR was found to have increased work of breathing. Surgery postponed and pulmonary consulted.   SUBJECTIVE:  "My breathing is ok "  Supine in bed with family surrounding her Remains very weak Saturation is 97% on 2L [place] Turned oxygen down to 1.5 L  VITAL SIGNS: Temp:  [97.3 F (36.3 C)-98.3 F (36.8 C)] 97.9 F (36.6 C) (11/28 0748) Pulse Rate:  [95-107] 96 (11/28 0748) Resp:  [19-27] 19 (11/28 0748) BP: (109-138)/(67-89) 123/81 (11/28 0748) SpO2:  [90 %-97 %] 96 % (11/28 0801) Weight:  [123 lb 14.4 oz (56.2 kg)] 123 lb 14.4 oz (56.2 kg) (11/28 0700)  PHYSICAL EXAMINATION: General:  Ill appearing elderly female, weak. No accessory muscle usage Neuro:  Awake, answers questions, generalized weakness.  HEENT:  MM pink/moist, Cardiovascular:  s1 s2 Normal rhythm no MRG Lungs: no wheeze, short sentences but not labored on O2 [place] Abdomen:  Soft non tender non distended, quiet Musculoskeletal: Muscle bulk normal for age/ gender.  Skin:  Pale/warm, grossly intact   Recent Labs Lab 05/21/16 0416 05/22/16 0549  05/23/16 0402  NA 136 137 138  K 4.4 4.2 4.6  CL 90* 96* 98*  CO2 37* 34* 33*  BUN 30* 34* 33*  CREATININE 1.02* 1.03* 1.09*  GLUCOSE 116* 104* 142*    Recent Labs Lab 05/21/16 1423 05/22/16 0549 05/22/16 1453 05/23/16 0402  HGB 9.3* 8.5* 7.8* 9.6*  HCT 26.9* 24.9* 22.8* 27.8*  WBC 15.0* 15.1*  --  13.9*  PLT 192 193  --  172   No results found.  ASSESSMENT / PLAN:  COPD mixed type- meds appropriate. Weakness impacts ability to cough and to mobilize. Emphasis now is on rehab post surgery to build strength P- continue Budesonide, formoterol and DuoNeb's    - Pulmonary Toilet>> IS and Flutter Valve     - CXR prn Acute on chronic resp failure- hypoxia. She will remain O2 dependent P- Continue O2, with goal saturations >88% Careful with pain meds  Updated family at bedside. Explained emphasis must be on gaining strength through rehab and PT. Encouraged use of Flutter Valve and IS to avoid development of pneumonia post op.  Magdalen Spatz, AGACNP-BC [facility] Pager [phone]    05/23/2016, 11:36 AM

## 2016-05-24 DIAGNOSIS — K219 Gastro-esophageal reflux disease without esophagitis: Secondary | ICD-10-CM

## 2016-05-24 DIAGNOSIS — I5022 Chronic systolic (congestive) heart failure: Secondary | ICD-10-CM

## 2016-05-24 DIAGNOSIS — D62 Acute posthemorrhagic anemia: Secondary | ICD-10-CM

## 2016-05-24 DIAGNOSIS — I10 Essential (primary) hypertension: Secondary | ICD-10-CM

## 2016-05-24 DIAGNOSIS — N179 Acute kidney failure, unspecified: Secondary | ICD-10-CM | POA: Diagnosis not present

## 2016-05-24 DIAGNOSIS — S72002D Fracture of unspecified part of neck of left femur, subsequent encounter for closed fracture with routine healing: Secondary | ICD-10-CM | POA: Diagnosis not present

## 2016-05-24 DIAGNOSIS — E039 Hypothyroidism, unspecified: Secondary | ICD-10-CM

## 2016-05-24 DIAGNOSIS — E871 Hypo-osmolality and hyponatremia: Secondary | ICD-10-CM

## 2016-05-24 DIAGNOSIS — E038 Other specified hypothyroidism: Secondary | ICD-10-CM

## 2016-05-24 DIAGNOSIS — R0603 Acute respiratory distress: Secondary | ICD-10-CM | POA: Diagnosis not present

## 2016-05-24 DIAGNOSIS — J9621 Acute and chronic respiratory failure with hypoxia: Secondary | ICD-10-CM | POA: Diagnosis not present

## 2016-05-24 LAB — CBC
HEMATOCRIT: 28 % — AB (ref 36.0–46.0)
Hemoglobin: 9.3 g/dL — ABNORMAL LOW (ref 12.0–15.0)
MCH: 30.8 pg (ref 26.0–34.0)
MCHC: 33.2 g/dL (ref 30.0–36.0)
MCV: 92.7 fL (ref 78.0–100.0)
PLATELETS: 197 10*3/uL (ref 150–400)
RBC: 3.02 MIL/uL — ABNORMAL LOW (ref 3.87–5.11)
RDW: 14.8 % (ref 11.5–15.5)
WBC: 13.2 10*3/uL — AB (ref 4.0–10.5)

## 2016-05-24 LAB — BASIC METABOLIC PANEL
ANION GAP: 9 (ref 5–15)
BUN: 29 mg/dL — ABNORMAL HIGH (ref 6–20)
CALCIUM: 8.3 mg/dL — AB (ref 8.9–10.3)
CO2: 28 mmol/L (ref 22–32)
CREATININE: 0.95 mg/dL (ref 0.44–1.00)
Chloride: 101 mmol/L (ref 101–111)
GFR, EST NON AFRICAN AMERICAN: 59 mL/min — AB (ref >60)
Glucose, Bld: 123 mg/dL — ABNORMAL HIGH (ref 65–99)
Potassium: 4.4 mmol/L (ref 3.5–5.1)
SODIUM: 138 mmol/L (ref 135–145)

## 2016-05-24 MED ORDER — FLUCONAZOLE 50 MG PO TABS
50.0000 mg | ORAL_TABLET | Freq: Once | ORAL | Status: AC
  Administered 2016-05-24: 50 mg via ORAL
  Filled 2016-05-24: qty 1

## 2016-05-24 MED ORDER — FLUCONAZOLE 100 MG PO TABS
100.0000 mg | ORAL_TABLET | Freq: Every day | ORAL | Status: DC
  Administered 2016-05-25: 100 mg via ORAL
  Filled 2016-05-24: qty 1

## 2016-05-24 MED ORDER — METHYLPREDNISOLONE SODIUM SUCC 40 MG IJ SOLR
40.0000 mg | Freq: Every day | INTRAMUSCULAR | Status: DC
  Administered 2016-05-25: 40 mg via INTRAVENOUS
  Filled 2016-05-24: qty 1

## 2016-05-24 MED ORDER — IPRATROPIUM-ALBUTEROL 0.5-2.5 (3) MG/3ML IN SOLN
3.0000 mL | Freq: Two times a day (BID) | RESPIRATORY_TRACT | Status: DC
  Administered 2016-05-24 – 2016-05-25 (×2): 3 mL via RESPIRATORY_TRACT
  Filled 2016-05-24 (×2): qty 3

## 2016-05-24 NOTE — Addendum Note (Signed)
Addendum  created 05/24/16 1188 by Myrtie Soman, MD   Anesthesia Staff edited

## 2016-05-24 NOTE — Progress Notes (Signed)
PROGRESS NOTE    Janice Daniels  NFA:[id number] DOB: 08/31/1945 DOA: 05/10/2016 PCP: Donnajean Lopes, MD   Chief Complaint  Patient presents with  . Hip Pain    pt fell onto her L hip this am   . Shortness of Breath    pt appears SOB upon exam     Brief Narrative:  HPI on 05/10/2016 by [name], NP Janice Daniels is a very pleasant 70 y.o. 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 Initial evaluation reveals a left closed hip fracture in the setting of some respiratory distress or likely related to COPD exacerbation  Information is obtained from the patient and the chart. Patient reports she "rolled out of bed" during the night while she was asleep. She reports feeling immediate left hip pain and was unable to get up off the floor. She denies any other injury or loss of consciousness. He states the pain was immediate sharp constant worse with moving located in her left hip. He made the pain worse. She states her husband had to help her get up and now is very painful. In addition she reports a persistent worsening shortness of breath for the last several weeks. She saw PCP who prescribed prednisone taper which was due to be complete tomorrow as well as Augmentin. She denies having had a chest x-ray done. Associated symptoms include subjective fever productive cough with increased sputum production thick greenish. In addition she has all but wheezing has been using her nebulizers per usual but states it's not helping. As a result of the above she reports a decreased oral intake and feels as though she is dehydrated. She complains of some dysuria but no frequency. She denies  headache dizziness syncope or near-syncope. She denies chest pain palpitations abdominal pain nausea vomiting lower extremity edema. She does complain of some constipation.  Interim history Admitted for COPD Exacerbation, pulm consulted. S/p surgery for hip fracture on 11/24.  Assessment & Plan   Closed hip fracture, left, status post ORIF -Orthopedics consulted and appreciated -S/p ORIF/IM nail on 05/19/16. Management per orthopedics: Weightbearing as tolerated.  -Will hold Lovenox for DVT prophylaxis due to hematoma surgery site and dropping on hb.   Acute respiratory failure with hypoxia secondary to COPD exacerbation and decompensated CHF -PCCM was consulted and assisted in management of her COPD exacerbation. She was transferred to stepdown unit preoperatively for close monitoring and management.  -She was treated with oxygen, bronchodilator nebulizations, Lasix, increasing Solu-Medrol, continue budesonide/Brovana and levofloxacin. -Postoperatively had episode of worsening dyspnea and hypoxia. This resolved after a nebulization treatment. Since then she has done well without any worsening. -Continue to reduce IV Solu-Medrol  -Repeat chest x-ray shows radiation fibrosis and right upper lung but otherwise no acute findings.   Acute on Chronic systolic heart failure -Last EF of 35-40%, mild LVH, diffuse hypokinesis. Clinically compensated. Encouraged oral fluid intake. Has not been on Lasix since 11/22. -6.9 L since admission.  -Repeat echo 05/19/16 shows severely reduced systolic function with LVEF 20-25 percent and grade 1 diastolic dysfunction but very poor quality study due to tachycardia and recommended repeat evaluation with Definity- -ECHO ordered. Cardiology consulted and appreciated. No further evaluation per cardio. Start ACE when renal function  stabilized.   Hyponatremia -Resolved  Hypertension -Not on medication therapy  Hypothyroidism -continue  synthroid  History of partial seizure -resume keppra. Unclear why this was stopped.  -continue gabapentin  Tobacco abuse -Counseling given  Abdominal distension/Constipation -Resolved -Treated aggressively with bowel regimen. Multiple BMs on 11/22 with significant improvement in symptoms and abdominal distention. Follow-up x-ray shows no fecal impaction. Continue bowel regimen. Abdominal distention has resolved. -Had multiple BM 11-27  History of small cell lung cancer -Diagnosed October 2012. Chart review indicates status post palliative radiotherapy, systemic chemotherapy prophylactic cranial irradiation, curative stereotactic radiotherapy. Office visit note dated August 2017 recent CT scan showed no evidence for disease progression septic for evolving radiation changes and masslike consolidation in the right upper lobe and posterior right upper lobe. Notes also indicates this is been evaluated with PET scan in the past and showed no hypermetabolic activity -Patient will follow-up in 6 months with oncology  Anemia/postop acute blood loss anemia -Hemoglobin dropped from 12.2 preoperatively gradually to 7.6 on 11/26. Suspect this will drop further and patient is symptomatic/week.  -received 2u PRBC.  -Hemoglobin currently 9.3  Acute kidney injury -May be due to volume depletion. Encouraged oral fluid intake. Resolved.  Oral candidiasis -Start oral fluconazole 1 week. EKG shows normal QTC. -Continue nystatin, magic mouthwash -may also be exacerbated by inhaled steroids  DVT Prophylaxis  SCDs  Code Status: Full  Family Communication: Family at bedside  Disposition Plan: Admitted. Plan for dc to SNF when available   Consultants Orthopedic surgery Cardiology PCCM  Procedures  Echocardiogram ORIF/IM nail of left hip fracture on 06/18/16  Antibiotics   Anti-infectives    Start     Dose/Rate Route Frequency Ordered Stop   05/21/16 1030  fluconazole (DIFLUCAN)  tablet 50 mg     50 mg Oral Daily 05/21/16 1015     05/19/16 1145  ceFAZolin (ANCEF) IVPB 2g/100 mL premix     2 g 200 mL/hr over 30 Minutes Intravenous Every 6 hours 05/19/16 1141 05/20/16 0045   05/19/16 0900  ceFAZolin (ANCEF) IVPB 2g/100 mL premix  Status:  Discontinued    Comments:  Anesthesia to give preop   2 g 200 mL/hr over 30 Minutes Intravenous To ShortStay Surgical 05/18/16 1057 05/20/16 1251   05/19/16 0600  ceFAZolin (ANCEF) IVPB 2g/100 mL premix  Status:  Discontinued     2 g 200 mL/hr over 30 Minutes Intravenous On call to O.R. 05/18/16 2119 05/19/16 1124   05/13/16 0945  ceFAZolin (ANCEF) IVPB 2g/100 mL premix    Comments:  Anesthesia to give preop   2 g 200 mL/hr over 30 Minutes Intravenous To Short Stay 05/13/16 0925 05/14/16 0945   05/12/16 1400  levofloxacin (LEVAQUIN) tablet 500 mg  Status:  Discontinued     500 mg Oral Every 24 hours 05/12/16 1024 05/16/16 1520   05/11/16 1400  levofloxacin (LEVAQUIN) IVPB 500 mg  Status:  Discontinued     500 mg 100 mL/hr over 60 Minutes Intravenous Every 24 hours 05/10/16 1730 05/12/16 1024   05/10/16 1445  doxycycline (VIBRA-TABS) tablet 100 mg  Status:  Discontinued     100 mg Oral Every 12 hours 05/10/16 1432 05/10/16 1721   05/10/16 1230  cefTRIAXone (ROCEPHIN) 1 g in dextrose 5 % 50 mL IVPB     1 g 100 mL/hr over 30 Minutes Intravenous  Once 05/10/16 1227 05/10/16 1327   05/10/16 1230  azithromycin (ZITHROMAX) 500 mg in dextrose 5 % 250 mL IVPB  500 mg 250 mL/hr over 60 Minutes Intravenous  Once 05/10/16 1227 05/10/16 1509      Subjective:   Janice Daniels seen and examined today.  Complains of sore throat and mouth, bumps on tongue.  Patient denies dizziness, chest pain, shortness of breath, abdominal pain, N/V/D/C.  Objective:   Vitals:   05/24/16 0655 05/24/16 0825 05/24/16 0856 05/24/16 1100  BP:  132/86  121/76  Pulse:  96  (!) 110  Resp:  (!) 26  (!) 22  Temp:  98.2 F (36.8 C)  98 F (36.7 C)   TempSrc:  Oral  Oral  SpO2:  93% 97% 94%  Weight: 54.7 kg (120 lb 11.2 oz)     Height:        Intake/Output Summary (Last 24 hours) at 05/24/16 1359 Last data filed at 05/23/16 1800  Gross per 24 hour  Intake                0 ml  Output                0 ml  Net                0 ml   Filed Weights   05/22/16 0614 05/23/16 0700 05/24/16 0655  Weight: 54.4 kg (119 lb 14.4 oz) 56.2 kg (123 lb 14.4 oz) 54.7 kg (120 lb 11.2 oz)    Exam  General: Well developed, chronically ill appearing, NAD  HEENT: NCAT, mucous membranes moist. +thrush  Cardiovascular: S1 S2 auscultated, no murmurs, RRR  Respiratory: Diminished, +rhonchi, nonproductive cough  Abdomen: Soft, nontender, nondistended, + bowel sounds  Extremities: warm dry without cyanosis clubbing or edema  Neuro: AAOx3, nonfocal  Psych: Flat   Data Reviewed: I have personally reviewed following labs and imaging studies  CBC:  Recent Labs Lab 05/21/16 0416 05/21/16 1423 05/22/16 0549 05/22/16 1453 05/23/16 0402 05/24/16 0507  WBC 13.0* 15.0* 15.1*  --  13.9* 13.2*  HGB 7.6* 9.3* 8.5* 7.8* 9.6* 9.3*  HCT 22.1* 26.9* 24.9* 22.8* 27.8* 28.0*  MCV 92.5 90.0 90.2  --  90.0 92.7  PLT 195 192 193  --  172 244   Basic Metabolic Panel:  Recent Labs Lab 05/18/16 0338  05/20/16 0315 05/21/16 0416 05/22/16 0549 05/23/16 0402 05/24/16 0507  NA 137  --  134* 136 137 138 138  K 4.3  --  4.5 4.4 4.2 4.6 4.4  CL 87*  --  88* 90* 96* 98* 101  CO2 40*  --  36* 37* 34* 33* 28  GLUCOSE 161*  --  150* 116* 104* 142* 123*  BUN 24*  --  35* 30* 34* 33* 29*  CREATININE 0.98  < > 1.26* 1.02* 1.03* 1.09* 0.95  CALCIUM 8.0*  --  7.8* 7.8* 7.6* 8.1* 8.3*  MG 3.5*  --   --   --   --   --   --   PHOS 3.4  --   --   --   --   --   --   < > = values in this interval not displayed. GFR: Estimated Creatinine Clearance: 46.9 mL/min (by C-G formula based on SCr of 0.95 mg/dL). Liver Function Tests: No results for input(s): AST,  ALT, ALKPHOS, BILITOT, PROT, ALBUMIN in the last 168 hours. No results for input(s): LIPASE, AMYLASE in the last 168 hours. No results for input(s): AMMONIA in the last 168 hours. Coagulation Profile: No results for input(s): INR, PROTIME in the  last 168 hours. Cardiac Enzymes: No results for input(s): CKTOTAL, CKMB, CKMBINDEX, TROPONINI in the last 168 hours. BNP (last 3 results) No results for input(s): PROBNP in the last 8760 hours. HbA1C: No results for input(s): HGBA1C in the last 72 hours. CBG:  Recent Labs Lab 05/19/16 1625  GLUCAP 176*   Lipid Profile: No results for input(s): CHOL, HDL, LDLCALC, TRIG, CHOLHDL, LDLDIRECT in the last 72 hours. Thyroid Function Tests: No results for input(s): TSH, T4TOTAL, FREET4, T3FREE, THYROIDAB in the last 72 hours. Anemia Panel: No results for input(s): VITAMINB12, FOLATE, FERRITIN, TIBC, IRON, RETICCTPCT in the last 72 hours. Urine analysis:    Component Value Date/Time   COLORURINE YELLOW 05/18/2016 0308   APPEARANCEUR TURBID (A) 05/18/2016 0308   LABSPEC 1.017 05/18/2016 0308   PHURINE 8.5 (H) 05/18/2016 0308   GLUCOSEU NEGATIVE 05/18/2016 0308   HGBUR LARGE (A) 05/18/2016 0308   HGBUR trace-lysed 09/07/2009 0847   BILIRUBINUR NEGATIVE 05/18/2016 0308   KETONESUR NEGATIVE 05/18/2016 0308   PROTEINUR 30 (A) 05/18/2016 0308   UROBILINOGEN 0.2 03/22/2011 1410   NITRITE NEGATIVE 05/18/2016 0308   LEUKOCYTESUR NEGATIVE 05/18/2016 0308   Sepsis Labs: '@LABRCNTIP'$ (procalcitonin:4,lacticidven:4)  ) Recent Results (from the past 240 hour(s))  Culture, Urine     Status: None   Collection Time: 05/15/16 10:22 AM  Result Value Ref Range Status   Specimen Description URINE, RANDOM  Final   Special Requests ADDED [id number]  Final   Culture NO GROWTH  Final   Report Status 05/17/2016 FINAL  Final  MRSA PCR Screening     Status: None   Collection Time: 05/17/16  7:52 PM  Result Value Ref Range Status   MRSA by PCR NEGATIVE  NEGATIVE Final    Comment:        The GeneXpert MRSA Assay (FDA approved for NASAL specimens only), is one component of a comprehensive MRSA colonization surveillance program. It is not intended to diagnose MRSA infection nor to guide or monitor treatment for MRSA infections.       Radiology Studies: No results found.   Scheduled Meds: . arformoterol  15 mcg Nebulization BID  . budesonide (PULMICORT) nebulizer solution  0.5 mg Nebulization BID  . fluconazole  50 mg Oral Daily  . gabapentin  300 mg Oral BID  . guaiFENesin  1,200 mg Oral BID  . ipratropium-albuterol  3 mL Nebulization BID  . levETIRAcetam  500 mg Oral Daily  . levothyroxine  25 mcg Oral QAC breakfast  . magic mouthwash  5 mL Oral QID  . methylPREDNISolone (SOLU-MEDROL) injection  40 mg Intravenous Q12H  . nystatin  5 mL Oral QID  . pantoprazole  40 mg Oral Daily  . polyethylene glycol  17 g Oral BID  . senna-docusate  1 tablet Oral BID  . simvastatin  20 mg Oral Daily   Continuous Infusions:   LOS: 14 days   Time Spent in minutes   30 minutes  Wyatt Galvan D.O. on 05/24/2016 at 1:59 PM  Between 7am to 7pm - Pager - [phone]  After 7pm go to [email or web address] - password [email or web address]  And look for the night coverage person covering for me after hours  [facility] Office  [phone]

## 2016-05-24 NOTE — Progress Notes (Signed)
Pt educated on the purpose of turning every 2 hours. Pt verbalizes understanding and states she does not want to be turned while sleeping.

## 2016-05-24 NOTE — Care Management Note (Addendum)
Case Management Note  Patient Details  Name: Janice Daniels MRN: [id number] Date of Birth: Jun 01, 1946  Subjective/Objective: Pt presented for Closed Hip fx and COPD exacerbation. Plan for SNF once stable for d/c.                     Action/Plan: CSW assisting with disposition needs at this time. No needs identified by CM.   Expected Discharge Date:                  Expected Discharge Plan:  Skilled Nursing Facility  In-House Referral:  Clinical Social Work  Discharge planning Services  CM Consult  Post Acute Care Choice:  NA Choice offered to:  NA  DME Arranged:  N/A DME Agency:  NA  HH Arranged:  NA HH Agency:  NA  Status of Service:  In process, will continue to follow  If discussed at Long Length of Stay Meetings, dates discussed:  05-25-16  Additional Comments:  Bethena Roys, RN 05/24/2016, 10:30 AM

## 2016-05-25 DIAGNOSIS — E441 Mild protein-calorie malnutrition: Secondary | ICD-10-CM | POA: Diagnosis not present

## 2016-05-25 DIAGNOSIS — R2689 Other abnormalities of gait and mobility: Secondary | ICD-10-CM | POA: Diagnosis not present

## 2016-05-25 DIAGNOSIS — S72012D Unspecified intracapsular fracture of left femur, subsequent encounter for closed fracture with routine healing: Secondary | ICD-10-CM | POA: Diagnosis not present

## 2016-05-25 DIAGNOSIS — J9601 Acute respiratory failure with hypoxia: Secondary | ICD-10-CM | POA: Diagnosis not present

## 2016-05-25 DIAGNOSIS — W19XXXD Unspecified fall, subsequent encounter: Secondary | ICD-10-CM | POA: Diagnosis not present

## 2016-05-25 DIAGNOSIS — N179 Acute kidney failure, unspecified: Secondary | ICD-10-CM | POA: Diagnosis not present

## 2016-05-25 DIAGNOSIS — J9621 Acute and chronic respiratory failure with hypoxia: Secondary | ICD-10-CM | POA: Diagnosis not present

## 2016-05-25 DIAGNOSIS — M6281 Muscle weakness (generalized): Secondary | ICD-10-CM | POA: Diagnosis not present

## 2016-05-25 DIAGNOSIS — K769 Liver disease, unspecified: Secondary | ICD-10-CM | POA: Diagnosis not present

## 2016-05-25 DIAGNOSIS — N184 Chronic kidney disease, stage 4 (severe): Secondary | ICD-10-CM | POA: Diagnosis not present

## 2016-05-25 DIAGNOSIS — Z4789 Encounter for other orthopedic aftercare: Secondary | ICD-10-CM | POA: Diagnosis not present

## 2016-05-25 DIAGNOSIS — D6489 Other specified anemias: Secondary | ICD-10-CM | POA: Diagnosis not present

## 2016-05-25 DIAGNOSIS — M25559 Pain in unspecified hip: Secondary | ICD-10-CM | POA: Diagnosis not present

## 2016-05-25 DIAGNOSIS — D649 Anemia, unspecified: Secondary | ICD-10-CM | POA: Diagnosis not present

## 2016-05-25 DIAGNOSIS — R569 Unspecified convulsions: Secondary | ICD-10-CM | POA: Diagnosis not present

## 2016-05-25 DIAGNOSIS — R918 Other nonspecific abnormal finding of lung field: Secondary | ICD-10-CM | POA: Diagnosis not present

## 2016-05-25 DIAGNOSIS — R531 Weakness: Secondary | ICD-10-CM | POA: Diagnosis not present

## 2016-05-25 DIAGNOSIS — E038 Other specified hypothyroidism: Secondary | ICD-10-CM | POA: Diagnosis not present

## 2016-05-25 DIAGNOSIS — I5022 Chronic systolic (congestive) heart failure: Secondary | ICD-10-CM | POA: Diagnosis not present

## 2016-05-25 DIAGNOSIS — J441 Chronic obstructive pulmonary disease with (acute) exacerbation: Secondary | ICD-10-CM | POA: Diagnosis not present

## 2016-05-25 DIAGNOSIS — R0603 Acute respiratory distress: Secondary | ICD-10-CM | POA: Diagnosis not present

## 2016-05-25 DIAGNOSIS — J449 Chronic obstructive pulmonary disease, unspecified: Secondary | ICD-10-CM | POA: Diagnosis not present

## 2016-05-25 DIAGNOSIS — C3411 Malignant neoplasm of upper lobe, right bronchus or lung: Secondary | ICD-10-CM | POA: Diagnosis not present

## 2016-05-25 DIAGNOSIS — I10 Essential (primary) hypertension: Secondary | ICD-10-CM | POA: Diagnosis not present

## 2016-05-25 DIAGNOSIS — E785 Hyperlipidemia, unspecified: Secondary | ICD-10-CM | POA: Diagnosis not present

## 2016-05-25 DIAGNOSIS — R1312 Dysphagia, oropharyngeal phase: Secondary | ICD-10-CM | POA: Diagnosis not present

## 2016-05-25 DIAGNOSIS — E559 Vitamin D deficiency, unspecified: Secondary | ICD-10-CM | POA: Diagnosis not present

## 2016-05-25 DIAGNOSIS — S72002D Fracture of unspecified part of neck of left femur, subsequent encounter for closed fracture with routine healing: Secondary | ICD-10-CM | POA: Diagnosis not present

## 2016-05-25 DIAGNOSIS — Z5181 Encounter for therapeutic drug level monitoring: Secondary | ICD-10-CM | POA: Diagnosis not present

## 2016-05-25 DIAGNOSIS — R1319 Other dysphagia: Secondary | ICD-10-CM | POA: Diagnosis not present

## 2016-05-25 DIAGNOSIS — D62 Acute posthemorrhagic anemia: Secondary | ICD-10-CM | POA: Diagnosis not present

## 2016-05-25 DIAGNOSIS — E039 Hypothyroidism, unspecified: Secondary | ICD-10-CM | POA: Diagnosis not present

## 2016-05-25 DIAGNOSIS — G3184 Mild cognitive impairment, so stated: Secondary | ICD-10-CM | POA: Diagnosis not present

## 2016-05-25 DIAGNOSIS — I255 Ischemic cardiomyopathy: Secondary | ICD-10-CM | POA: Diagnosis not present

## 2016-05-25 DIAGNOSIS — K5909 Other constipation: Secondary | ICD-10-CM | POA: Diagnosis not present

## 2016-05-25 LAB — CBC
HCT: 29.5 % — ABNORMAL LOW (ref 36.0–46.0)
Hemoglobin: 9.7 g/dL — ABNORMAL LOW (ref 12.0–15.0)
MCH: 31.2 pg (ref 26.0–34.0)
MCHC: 32.9 g/dL (ref 30.0–36.0)
MCV: 94.9 fL (ref 78.0–100.0)
PLATELETS: 206 10*3/uL (ref 150–400)
RBC: 3.11 MIL/uL — AB (ref 3.87–5.11)
RDW: 14.6 % (ref 11.5–15.5)
WBC: 13.6 10*3/uL — AB (ref 4.0–10.5)

## 2016-05-25 MED ORDER — PHENOL 1.4 % MT LIQD: 1.0000 | OROMUCOSAL | 0 refills | Status: AC | PRN

## 2016-05-25 MED ORDER — MAGIC MOUTHWASH W/LIDOCAINE: 15.0000 mL | Freq: Four times a day (QID) | ORAL | 0 refills | Status: DC

## 2016-05-25 MED ORDER — MAGIC MOUTHWASH W/LIDOCAINE
15.0000 mL | Freq: Four times a day (QID) | ORAL | Status: DC
  Administered 2016-05-25 (×2): 15 mL via ORAL
  Filled 2016-05-25 (×6): qty 15

## 2016-05-25 MED ORDER — MAGIC MOUTHWASH W/LIDOCAINE: 15.0000 mL | Freq: Four times a day (QID) | ORAL | Status: DC | PRN

## 2016-05-25 MED ORDER — NYSTATIN 100000 UNIT/ML MT SUSP
10.0000 mL | Freq: Four times a day (QID) | OROMUCOSAL | Status: DC
  Administered 2016-05-25 (×2): 1000000 [IU] via ORAL
  Filled 2016-05-25: qty 10

## 2016-05-25 MED ORDER — FLUCONAZOLE 100 MG PO TABS: 100.0000 mg | ORAL_TABLET | Freq: Every day | ORAL | 0 refills | Status: DC

## 2016-05-25 MED ORDER — LEVETIRACETAM ER 500 MG PO TB24: 500.0000 mg | ORAL_TABLET | Freq: Every day | ORAL | 0 refills | Status: AC

## 2016-05-25 MED ORDER — ALUM & MAG HYDROXIDE-SIMETH 200-200-20 MG/5ML PO SUSP: 30.0000 mL | ORAL | 0 refills | Status: AC | PRN

## 2016-05-25 NOTE — Progress Notes (Signed)
Report given to Laney Pastor at [facility].  Pt resting in no acute distress.  Does continue to c/o pain in throat.  Pressure dsg applied Left hip, blood oozing in minimal amount.  Dr. Erlinda Hong has seen pt.  VSS.  All belongings have been packed up and will be up here until family picks them up. Waiting on [facility] to transport pt.

## 2016-05-25 NOTE — Progress Notes (Signed)
Upon assessment this morning, the 3 pink 4x4 foam dsg to left hip/leg were clean, dry, and intact.  The dsg in middle had 2 small (dime sized) areas that were marked and looked like old dry blood.    Around lunchtime today, pt had incontinence episode of urine and while changing/cleaning her, the dsg at the very top (on Left hip) was almost completely soaked with Bright red blood.  Dsg changed and Ortho MD at [phone] was notified.    Will hold pt until Dr. Erlinda Hong, who is at [facility], sees pt.  SW updated.  Dr. Ree Kida also aware.  Pt to get d/c to [facility] today.   Will continue to monitor dsg/incision site.

## 2016-05-25 NOTE — Progress Notes (Signed)
Patient ID: Janice Daniels, female   DOB: 05-13-1946, 70 y.o.   MRN: [id number] Received call from nursing that Janice Daniels is having some bloody drainage from the upper aspect of the left hip incision. I called Dr. Erlinda Hong and asked if he would  See her today prior to her discharge to SNF to assess the incision.

## 2016-05-25 NOTE — Clinical Social Work Note (Signed)
Pt is ready for discharge today to [facility]. [employer] [facility] Kaufmann has been obtained (#[id number]). Facility has received discharge information and admission paperwork has been completed. Pt and family are aware of discharge plan. RN to call report. [facility] will provide transportation. CSW is signing off as no further needs identified.   Darden Dates, MSW, LCSW  Clinical Social Worker  [phone]

## 2016-05-25 NOTE — Clinical Social Work Note (Signed)
Clinical Social Work Assessment  Patient Details  Name: Janice Daniels MRN: [id number] Date of Birth: Feb 06, 1946  Date of referral:  05/25/16               Reason for consult:  Facility Placement, Discharge Planning                Permission sought to share information with:  Family Supports Permission granted to share information::  Yes, Verbal Permission Granted  Name::     [name] Kaner  Relationship::  husband  Contact Information:  [phone]  Housing/Transportation Living arrangements for the past 2 months:  [place] of Information:  Patient, Adult Children Patient Interpreter Needed:  None Criminal Activity/Legal Involvement Pertinent to Current Situation/Hospitalization:  No - Comment as needed Significant Relationships:  Adult Children, Spouse Lives with:  Adult Children, Spouse Do you feel safe going back to the place where you live?  Yes Need for family participation in patient care:  Yes (Comment)  Care giving concerns:  No care giving concerns identified.   Social Worker assessment / plan:  CSW met with pt to address consult. CSW introduced herself and explained role of social work. CSW also explained the process of discharging to SNF. Pt is agreeable to SNF, however wanted CSW to speak with husband and son. CSW called family to inform them of discharge plans. Per MD, pt is ready for discharge today. CSW initiated SNF search and followed up with bed offers. Pt's husband chose [facility]. Pt is in agreement. CSW initiated [employer]. Pt's husband will sign pt in at 3 PM at facility. CSW sent information to the facility and they will be able to to accept pt once auth is obtained. CSW updated RN. CSW will continue to follow.   Employment status:  Retired Nurse, adult PT Recommendations:  [place] / Referral to community resources:  [place]  Patient/Family's  Response to care:  Pt and family were appreciative of CSW support.   Patient/Family's Understanding of and Emotional Response to Diagnosis, Current Treatment, and Prognosis:  Pt and family understand that pt would benefit from STR at SNF prior to reutrning home.   Emotional Assessment Appearance:    Attitude/Demeanor/Rapport:  Other (Appropriate) Affect (typically observed):  Accepting Orientation:  Oriented to Self, Oriented to Place, Oriented to  Time, Oriented to Situation Alcohol / Substance use:  Never Used Psych involvement (Current and /or in the community):  No (Comment)  Discharge Needs  Concerns to be addressed:    Readmission within the last 30 days:  No Current discharge risk:  Chronically ill Barriers to Discharge:  No Barriers Identified   Darden Dates, LCSW 05/25/2016, 1:53 PM

## 2016-05-25 NOTE — Clinical Social Work Placement (Signed)
   CLINICAL SOCIAL WORK PLACEMENT  NOTE  Date:  05/25/2016  Patient Details  Name: Janice Daniels MRN: [id number] Date of Birth: 1945-09-30  Clinical Social Work is seeking post-discharge placement for this patient at the [place] level of care (*CSW will initial, date and re-position this form in  chart as items are completed):  Yes   Patient/family provided with [place] Work Department's list of facilities offering this level of care within the geographic area requested by the patient (or if unable, by the patient's family).  Yes   Patient/family informed of their freedom to choose among providers that offer the needed level of care, that participate in Medicare, Medicaid or managed care program needed by the patient, have an available bed and are willing to accept the patient.  Yes   Patient/family informed of [facility]'s ownership interest in [facility] and [facility], as well as of the fact that they are under no obligation to receive care at these facilities.  PASRR submitted to EDS on 05/22/16     PASRR number received on 05/22/16     Existing PASRR number confirmed on       FL2 transmitted to all facilities in geographic area requested by pt/family on 05/25/16     FL2 transmitted to all facilities within larger geographic area on       Patient informed that his/her managed care company has contracts with or will negotiate with certain facilities, including the following:        Yes   Patient/family informed of bed offers received.  Patient [facility] bed at [facility]     Physician recommends and patient chooses bed at      Patient to be transferred to [facility] on 05/25/16.  Patient to be transferred to facility by [facility]     Patient family notified on 05/25/16 of transfer.  Name of family member notified:  Pt's husband and son     PHYSICIAN       Additional Comment:     _______________________________________________ Darden Dates, LCSW 05/25/2016, 1:46 PM

## 2016-05-25 NOTE — Discharge Summary (Addendum)
Physician Discharge Summary  Janice Daniels MVH:[id number] DOB: 27-Jun-1945 DOA: 05/10/2016  PCP: Donnajean Lopes, MD  Admit date: 05/10/2016 Discharge date: 05/25/2016  Time spent: 45 minutes  Recommendations for Outpatient Follow-up:  Patient will be discharged to skilled nursing facility.  Continue physical and occupational therapy. Patient will need to follow up with primary care provider within one week of discharge, repeat CBC and BMP.  Follow up with Dr. Erlinda Hong, orthopedics in 1-2 weeks.  Patient should continue medications as prescribed.  Patient should follow a heart healthy diet.   Discharge Diagnoses:  Closed hip fracture, left, status post ORIF Acute respiratory failure with hypoxia secondary to COPD exacerbation and decompensated CHF Acute on Chronic systolic heart failure Hyponatremia Hypertension Hypothyroidism History of partial seizure Tobacco abuse Abdominal distension/Constipation History of small cell lung cancer Anemia/postop acute blood loss anemia Acute kidney injury Oral candidiasis/oral sores  Discharge Condition: Stable  Diet recommendation: heart healthy  Filed Weights   05/23/16 0700 05/24/16 0655 05/25/16 0352  Weight: 56.2 kg (123 lb 14.4 oz) 54.7 kg (120 lb 11.2 oz) 57.1 kg (125 lb 14.4 oz)    History of present illness:  on 05/10/2016 by Ms. 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 Initial evaluation reveals a left closed hip fracture in the setting of some respiratory distress or likely related to COPD exacerbation  Information is obtained from the patient and the  chart. Patient reports she "rolled out of bed" during the night while she was asleep. She reports feeling immediate left hip pain and was unable to get up off the floor. She denies any other injury or loss of consciousness. He states the pain was immediate sharp constant worse with moving located in her left hip. He made the pain worse. She states her husband had to help her get up and now is very painful. In addition she reports a persistent worsening shortness of breath for the last several weeks. She saw PCP who prescribed prednisone taper which was due to be complete tomorrow as well as Augmentin. She denies having had a chest x-ray done. Associated symptoms include subjective fever productive cough with increased sputum production thick greenish. In addition she has all but wheezing has been using her nebulizers per usual but states it's not helping. As a result of the above she reports a decreased oral intake and feels as though she is dehydrated. She complains of some dysuria but no frequency. She denies headache dizziness syncope or near-syncope. She denies chest pain palpitations abdominal pain nausea vomiting lower extremity edema. She does complain of some constipation.  Hospital Course:  Closed hip fracture, left, status post ORIF -Orthopedics consulted and appreciated -S/p ORIF/IM nail on 05/19/16. Management per orthopedics: Weightbearing as tolerated.  -Will hold Lovenox for DVT prophylaxis due to hematoma surgery site -hemoglobin stable, 9.7 -follow up with ortho in 1-2 weeks  Acute respiratory failure with hypoxia secondary to COPD exacerbation and decompensated CHF -PCCM was consulted and assisted in management of her COPD exacerbation. She was transferred to stepdown unit preoperatively for close monitoring and management.  -She was treated with oxygen, bronchodilator nebulizations, Lasix, increasing Solu-Medrol, continue budesonide/Brovana and levofloxacin. -Postoperatively had  episode of  worsening dyspnea and hypoxia. This resolved after a nebulization treatment. Since then she has done well without any worsening. -Repeat chest x-ray shows radiation fibrosis and right upper lung but otherwise no acute findings.  -Weaned off of solumedrol -feel that steroids are contributing to patient's oral candidiasis   Acute on Chronic systolic heart failure -Last EF of 35-40%, mild LVH, diffuse hypokinesis. Clinically compensated. Encouraged oral fluid intake. Has not been on Lasix since 11/22. -6.9 L since admission.  -Repeat echo 05/19/16 shows severely reduced systolic function with LVEF 20-25 percent and grade 1 diastolic dysfunction but very poor quality study due to tachycardia and recommended repeat evaluation with Definity- -ECHO ordered. Cardiology consulted and appreciated. No further evaluation per cardio. Start ACE when renal function stabilized.  -Will discharge with low dose lisinopril -repeat BMP in one week  Hyponatremia -Resolved   Hypertension -Not on medication therapy  Hypothyroidism -continue synthroid  History of partial seizure -resume keppra. Unclear why this was stopped.  -continue gabapentin  Tobacco abuse -Counseling given  Abdominal distension/Constipation -Resolved -Treated aggressively with bowel regimen. Multiple BMs on 11/22 with significant improvement in symptoms and abdominal distention. Follow-up x-ray shows no fecal impaction. Continue bowel regimen. Abdominal distention has resolved. -Had multiple BM 11-27  History of small cell lung cancer -Diagnosed October 2012. Chart review indicates status post palliative radiotherapy, systemic chemotherapy prophylactic cranial irradiation, curative stereotactic radiotherapy. Office visit note dated August 2017 recent CT scan showed no evidence for disease progression septic for evolving radiation changes and masslike consolidation in the right upper lobe and posterior right upper  lobe. Notes also indicates this is been evaluated with PET scan in the past and showed no hypermetabolic activity -Patient will follow-up in 6 months with oncology  Anemia/postop acute blood loss anemia -Hemoglobin dropped from 12.2 preoperatively gradually to 7.6 on 11/26. Suspect this will drop further and patient is symptomatic/week.  -received 2u PRBC.  -Hemoglobin currently 9.7 (stable for the past 3 days) -Repeat CBC in one week  Acute kidney injury -May be due to volume depletion. Encouraged oral fluid intake. Resolved.  Oral candidiasis/oral sores -Start oral fluconazole 1 week. EKG shows normal QTC. -Continue nystatin, magic mouthwash -Adding lidocaine to magic mouthwash -may also be exacerbated by inhaled and IV steroids given for COPD -if oral sores are caused by viral etiology, given the length of these sores, it is too late to start antivirals   Consultants Orthopedic surgery Cardiology PCCM  Procedures  Echocardiogram ORIF/IM nail of left hip fracture on 06/18/16  Discharge Exam: Vitals:   05/25/16 0751 05/25/16 0804  BP: 134/81   Pulse:    Resp:    Temp:  98.7 F (37.1 C)   Complains of sore throat and mouth, bumps on tongue.  Patient denies dizziness, chest pain, shortness of breath, abdominal pain, N/V/D/C.  Exam  General: Well developed, chronically ill appearing, NAD  HEENT: NCAT, mucous membranes moist. +thrush  Cardiovascular: S1 S2 auscultated, no murmurs, RRR  Respiratory: Diminished, nonproductive cough  Abdomen: Soft, nontender, nondistended, + bowel sounds  Extremities: warm dry without cyanosis clubbing or edema  Neuro: AAOx3, nonfocal  Psych: Flat  Discharge Instructions Discharge Instructions    Discharge instructions    Complete by:  As directed    Patient will be discharged to skilled nursing facility.  Continue physical and occupational therapy. Patient will need to follow up with primary care provider within one week  of discharge, repeat CBC and BMP.  Follow up with Dr. Erlinda Hong, orthopedics  in 1-2 weeks.  Patient should continue medications as prescribed.  Patient should follow a heart healthy diet.   Weight bearing as tolerated    Complete by:  As directed      Current Discharge Medication List    START taking these medications   Details  alum & mag hydroxide-simeth (MAALOX/MYLANTA) 200-200-20 MG/5ML suspension Take 30 mLs by mouth every 4 (four) hours as needed for indigestion. Qty: 355 mL, Refills: 0    enoxaparin (LOVENOX) 40 MG/0.4ML injection Inject 0.4 mLs (40 mg total) into the skin daily. Qty: 14 Syringe, Refills: 0    fluconazole (DIFLUCAN) 100 MG tablet Take 1 tablet (100 mg total) by mouth daily. Qty: 14 tablet, Refills: 0    magic mouthwash w/lidocaine SOLN Take 15 mLs by mouth 4 (four) times daily. Refills: 0    oxyCODONE-acetaminophen (PERCOCET) 5-325 MG tablet Take 1-2 tablets by mouth every 4 (four) hours as needed for severe pain. Qty: 90 tablet, Refills: 0    phenol (CHLORASEPTIC) 1.4 % LIQD Use as directed 1 spray in the mouth or throat as needed for throat irritation / pain. Refills: 0      CONTINUE these medications which have CHANGED   Details  levETIRAcetam (KEPPRA XR) 500 MG 24 hr tablet Take 1 tablet (500 mg total) by mouth daily. Qty: 30 tablet, Refills: 0      CONTINUE these medications which have NOT CHANGED   Details  ADVAIR DISKUS 250-50 MCG/DOSE AEPB Inhale 1 puff into the lungs 2 (two) times daily. Refills: 2   Associated Diagnoses: Cancer of upper lobe of right lung (HCC)    albuterol (VENTOLIN HFA) 108 (90 BASE) MCG/ACT inhaler Inhale 2 puffs into the lungs every 6 (six) hours as needed for wheezing or shortness of breath.     aspirin EC 81 MG EC tablet Take 1 tablet (81 mg total) by mouth daily.    gabapentin (NEURONTIN) 300 MG capsule Take 300 mg by mouth 2 (two) times daily. Refills: 3   Associated Diagnoses: Cancer of upper lobe of right lung (HCC)      ipratropium (ATROVENT HFA) 17 MCG/ACT inhaler Inhale 2 puffs into the lungs every 6 (six) hours.    ipratropium-albuterol (DUONEB) 0.5-2.5 (3) MG/3ML SOLN Take 3 mLs by nebulization every 6 (six) hours. Qty: 360 mL, Refills: 0    levothyroxine (SYNTHROID, LEVOTHROID) 25 MCG tablet Take 25 mcg by mouth daily.   Associated Diagnoses: Cancer of upper lobe of right lung (HCC)    omeprazole (PRILOSEC) 20 MG capsule Take 20 mg by mouth daily.   Associated Diagnoses: Cancer of upper lobe of right lung (HCC)    simvastatin (ZOCOR) 20 MG tablet Take 1 tablet (20 mg total) by mouth daily. PLEASE CONTACT OFFICE FOR ADDITIONAL REFILLS Qty: 30 tablet, Refills: 0    spironolactone (ALDACTONE) 25 MG tablet Take 0.5 tablets (12.5 mg total) by mouth daily. PLEASE CONTACT OFFICE FOR ADDITIONAL REFILLS Qty: 15 tablet, Refills: 0    Vitamin D, Ergocalciferol, (DRISDOL) 50000 UNITS CAPS capsule Take 50,000 Units by mouth every 7 (seven) days. Monday       Allergies  Allergen Reactions  . Codeine Other (See Comments)    Elevated pulse   Follow-up Information    Eduard Roux, MD Follow up in 2 week(s).   Specialty:  Orthopedic Surgery Why:  For suture removal, For wound re-check Contact information: [place] [address] [phone]        Donnajean Lopes, MD.  Schedule an appointment as soon as possible for a visit in 1 week(s).   Specialty:  Internal Medicine Why:  hospital follow up Contact information: [place] [address] [phone]            The results of significant diagnostics from this hospitalization (including imaging, microbiology, ancillary and laboratory) are listed below for reference.    Significant Diagnostic Studies: Dg Chest 2 View  Result Date: 05/10/2016 CLINICAL DATA:  Status post falling out of bed yesterday striking the left hip. The patient reports worsening shortness of breath and nonproductive cough.  History of COPD. Current smoker. History of lung malignancy. EXAM: CHEST  2 VIEW COMPARISON:  CT scan of the chest of February 07, 2016 FINDINGS: The lungs are hyperinflated. There are chronic changes in the right suprahilar region not greatly changed from the previous study. There is no alveolar infiltrate. There is no pleural effusion. The heart and pulmonary vascularity are normal. There is mild shift of mediastinum toward the right. The observed bony thorax exhibits no acute abnormality. IMPRESSION: COPD. Chronic right upper lobe changes consistent with known malignancy. There has not been dramatic radiographic change since the CT scan of February 07, 2016 but direct comparison of today's chest x-ray with the previous CT scan is of limited utility. Repeat chest CT scanning is recommended. COPD.  No CHF.  The observed bony thorax is unremarkable. Electronically Signed   By: David  Martinique M.D.   On: 05/10/2016 11:59   Ct Hip Left Wo Contrast  Result Date: 05/10/2016 CLINICAL DATA:  Proximal left femur fracture secondary to a fall yesterday. EXAM: CT OF THE LEFT HIP WITHOUT CONTRAST TECHNIQUE: Multidetector CT imaging of the left hip was performed according to the standard protocol. Multiplanar CT image reconstructions were also generated. COMPARISON:  Radiographs dated 05/10/2016 FINDINGS: Bones/Joint/Cartilage There is an impacted slightly comminuted intertrochanteric and low left femoral neck fracture with minimal angulation and minimal displacement. The visualized pelvic bones are intact. Joint spaces well-preserved. Adjacent soft tissues are normal. IMPRESSION: Impacted slightly comminuted intertrochanteric and low femoral neck fracture on the left. Electronically Signed   By: Lorriane Shire M.D.   On: 05/10/2016 13:29   Dg Chest Port 1 View  Result Date: 05/19/2016 CLINICAL DATA:  Dyspnea, recent hip surgery. History of right upper lobe lung cancer treated with radiation therapy. EXAM: PORTABLE CHEST 1  VIEW COMPARISON:  05/18/2016 chest radiograph. FINDINGS: Stable cardiomediastinal silhouette with normal heart size and right deviated mediastinum. No pneumothorax. No pleural effusion. Stable volume loss, distortion and parahilar consolidation in the upper right lung. No acute consolidative airspace disease. No pulmonary edema. IMPRESSION: 1. No acute cardiopulmonary disease. 2. 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 Electronically Signed   By: Ilona Sorrel M.D.   On: 05/19/2016 13:02   Dg Chest Port 1 View  Result Date: 05/18/2016 CLINICAL DATA:  Shortness of breath.  Acute respiratory failure. EXAM: PORTABLE CHEST 1 VIEW COMPARISON:  05/17/2016 and 05/10/2016 and CT scan of the chest 02/07/2016 FINDINGS: Heart size and pulmonary vascularity are normal. Scarring and a masslike density in the right upper lung zone and right hilar region, unchanged. Left lung is clear. No infiltrates or effusions. IMPRESSION: Stable appearance of the chest since the prior study. Probable scarring in the right upper lung zone and right perihilar region as described. Electronically Signed   By: Lorriane Shire M.D.   On: 05/18/2016 09:24   Dg Chest [facility] 1 View  Result  Date: 05/17/2016 CLINICAL DATA:  Short of breath EXAM: PORTABLE CHEST 1 VIEW COMPARISON:  05/14/2016 FINDINGS: The heart is normal in size. Persistent volume loss in the right lung is stable. Hazy opacity towards the right apex is unchanged. There is linear scarring in the right upper lung. Left lung is clear. No pneumothorax or pleural effusion. IMPRESSION: Stable changes related of malignancy and volume loss in the right lung. Electronically Signed   By: Marybelle Killings M.D.   On: 05/17/2016 14:30   Dg Chest Port 1 View  Result Date: 05/14/2016 CLINICAL DATA:  Pulmonary mass, shortness of Breath EXAM: PORTABLE CHEST 1 VIEW COMPARISON:  05/10/2016.  Chest CT 01/28/2016 FINDINGS: Chronic changes noted in the right upper lobe, stable  compatible with known malignancy. Left lung is clear. Heart is borderline in size. No visible effusions or acute bony abnormality. IMPRESSION: Chronic right upper lobe changes compatible with known malignancy. No acute findings. Electronically Signed   By: Rolm Baptise M.D.   On: 05/14/2016 11:05   Dg Abd Portable 1v  Result Date: 05/18/2016 CLINICAL DATA:  Fecal impaction. EXAM: PORTABLE ABDOMEN - 1 VIEW COMPARISON:  05/16/2016 FINDINGS: There is fluid and gas slightly distended ascending and transverse portions of the colon. No visible fecal impaction. No dilated small bowel. Angulated comminuted intertrochanteric fracture of the proximal left femur is noted. The angulation is new since the prior study of 05/10/2016. IMPRESSION: Slightly distended transverse portion of the colon. No visible fecal impaction. New angulation of the proximal left femur fracture. Electronically Signed   By: Lorriane Shire M.D.   On: 05/18/2016 09:26   Dg Abd Portable 1v  Result Date: 05/16/2016 CLINICAL DATA:  Abdominal distention.  Epigastric pain. EXAM: PORTABLE ABDOMEN - 1 VIEW COMPARISON:  Two days ago FINDINGS: Decreased stool retention and diminished gaseous distention of colon. No small bowel obstruction pattern. No concerning mass effect or gas collection. EKG leads create artifact over the abdomen. Known proximal left femur fracture.  Osteopenia. IMPRESSION: Stool retention and gaseous colon distention are improved from 2 days prior. Electronically Signed   By: Monte Fantasia M.D.   On: 05/16/2016 11:17   Dg Abd Portable 1v  Result Date: 05/14/2016 CLINICAL DATA:  Hemoptysis.  Pulmonary mass. EXAM: PORTABLE ABDOMEN - 1 VIEW COMPARISON:  None. FINDINGS: Large stool burden in the right colon. Gaseous distention of the colon. No evidence of small bowel obstruction, free air organomegaly. IMPRESSION: Large stool burden in the right colon with gaseous distention of the colon. This may reflect adynamic ileus.  Electronically Signed   By: Rolm Baptise M.D.   On: 05/14/2016 11:04   Dg C-arm 1-60 Min  Result Date: 05/19/2016 CLINICAL DATA:  Internal fixation of intertrochanteric left femur fracture. EXAM: LEFT FEMUR 2 VIEWS; DG C-ARM 61-120 MIN COMPARISON:  05/10/2016 CT and radiographs FINDINGS: Four intraoperative spot films are submitted postoperatively for interpretation. An intra medullary rod nail and screws are present traversing an intertrochanteric left femur fracture, in near-anatomic alignment. No definite complicating features are noted. IMPRESSION: Internal fixation of intertrochanteric left femur fracture. Electronically Signed   By: Margarette Canada M.D.   On: 05/19/2016 10:37   Dg Hip Unilat W Or Wo Pelvis 2-3 Views Left  Result Date: 05/10/2016 CLINICAL DATA:  Left hip pain after fall yesterday. EXAM: DG HIP (WITH OR WITHOUT PELVIS) 2-3V LEFT COMPARISON:  None. FINDINGS: There appears to be cortical disruption involving the greater trochanter concerning for mildly displaced fracture. The left femoral head and neck  appear intact. No dislocation is noted. IMPRESSION: Probable mildly displaced fracture involving greater trochanter of proximal left femur. CT scan of the left hip is recommended for further evaluation. Electronically Signed   By: Marijo Conception, M.D.   On: 05/10/2016 12:01   Dg Femur Min 2 Views Left  Result Date: 05/19/2016 CLINICAL DATA:  Internal fixation of intertrochanteric left femur fracture. EXAM: LEFT FEMUR 2 VIEWS; DG C-ARM 61-120 MIN COMPARISON:  05/10/2016 CT and radiographs FINDINGS: Four intraoperative spot films are submitted postoperatively for interpretation. An intra medullary rod nail and screws are present traversing an intertrochanteric left femur fracture, in near-anatomic alignment. No definite complicating features are noted. IMPRESSION: Internal fixation of intertrochanteric left femur fracture. Electronically Signed   By: Margarette Canada M.D.   On: 05/19/2016 10:37     Microbiology: Recent Results (from the past 240 hour(s))  MRSA PCR Screening     Status: None   Collection Time: 05/17/16  7:52 PM  Result Value Ref Range Status   MRSA by PCR NEGATIVE NEGATIVE Final    Comment:        The GeneXpert MRSA Assay (FDA approved for NASAL specimens only), is one component of a comprehensive MRSA colonization surveillance program. It is not intended to diagnose MRSA infection nor to guide or monitor treatment for MRSA infections.      Labs: Basic Metabolic Panel:  Recent Labs Lab 05/20/16 0315 05/21/16 0416 05/22/16 0549 05/23/16 0402 05/24/16 0507  NA 134* 136 137 138 138  K 4.5 4.4 4.2 4.6 4.4  CL 88* 90* 96* 98* 101  CO2 36* 37* 34* 33* 28  GLUCOSE 150* 116* 104* 142* 123*  BUN 35* 30* 34* 33* 29*  CREATININE 1.26* 1.02* 1.03* 1.09* 0.95  CALCIUM 7.8* 7.8* 7.6* 8.1* 8.3*   Liver Function Tests: No results for input(s): AST, ALT, ALKPHOS, BILITOT, PROT, ALBUMIN in the last 168 hours. No results for input(s): LIPASE, AMYLASE in the last 168 hours. No results for input(s): AMMONIA in the last 168 hours. CBC:  Recent Labs Lab 05/21/16 1423 05/22/16 0549 05/22/16 1453 05/23/16 0402 05/24/16 0507 05/25/16 0725  WBC 15.0* 15.1*  --  13.9* 13.2* 13.6*  HGB 9.3* 8.5* 7.8* 9.6* 9.3* 9.7*  HCT 26.9* 24.9* 22.8* 27.8* 28.0* 29.5*  MCV 90.0 90.2  --  90.0 92.7 94.9  PLT 192 193  --  172 197 206   Cardiac Enzymes: No results for input(s): CKTOTAL, CKMB, CKMBINDEX, TROPONINI in the last 168 hours. BNP: BNP (last 3 results)  Recent Labs  05/10/16 1044  BNP 305.0*    ProBNP (last 3 results) No results for input(s): PROBNP in the last 8760 hours.  CBG:  Recent Labs Lab 05/19/16 1625  GLUCAP 176*       Signed:  Cristal Ford  [facility] 05/25/2016, 12:34 PM

## 2016-05-26 DIAGNOSIS — S72012D Unspecified intracapsular fracture of left femur, subsequent encounter for closed fracture with routine healing: Secondary | ICD-10-CM | POA: Diagnosis not present

## 2016-05-26 DIAGNOSIS — G3184 Mild cognitive impairment, so stated: Secondary | ICD-10-CM | POA: Diagnosis not present

## 2016-05-26 DIAGNOSIS — R1319 Other dysphagia: Secondary | ICD-10-CM | POA: Diagnosis not present

## 2016-05-26 DIAGNOSIS — S72002D Fracture of unspecified part of neck of left femur, subsequent encounter for closed fracture with routine healing: Secondary | ICD-10-CM | POA: Diagnosis not present

## 2016-05-26 DIAGNOSIS — E039 Hypothyroidism, unspecified: Secondary | ICD-10-CM | POA: Diagnosis not present

## 2016-05-26 DIAGNOSIS — K5909 Other constipation: Secondary | ICD-10-CM | POA: Diagnosis not present

## 2016-05-26 DIAGNOSIS — J441 Chronic obstructive pulmonary disease with (acute) exacerbation: Secondary | ICD-10-CM | POA: Diagnosis not present

## 2016-05-26 DIAGNOSIS — K769 Liver disease, unspecified: Secondary | ICD-10-CM | POA: Diagnosis not present

## 2016-05-26 DIAGNOSIS — J9601 Acute respiratory failure with hypoxia: Secondary | ICD-10-CM | POA: Diagnosis not present

## 2016-05-26 DIAGNOSIS — W19XXXD Unspecified fall, subsequent encounter: Secondary | ICD-10-CM | POA: Diagnosis not present

## 2016-05-26 DIAGNOSIS — E785 Hyperlipidemia, unspecified: Secondary | ICD-10-CM | POA: Diagnosis not present

## 2016-05-26 DIAGNOSIS — D649 Anemia, unspecified: Secondary | ICD-10-CM | POA: Diagnosis not present

## 2016-05-26 DIAGNOSIS — R918 Other nonspecific abnormal finding of lung field: Secondary | ICD-10-CM | POA: Diagnosis not present

## 2016-05-26 DIAGNOSIS — E559 Vitamin D deficiency, unspecified: Secondary | ICD-10-CM | POA: Diagnosis not present

## 2016-05-26 DIAGNOSIS — N184 Chronic kidney disease, stage 4 (severe): Secondary | ICD-10-CM | POA: Diagnosis not present

## 2016-05-26 DIAGNOSIS — I10 Essential (primary) hypertension: Secondary | ICD-10-CM | POA: Diagnosis not present

## 2016-05-26 DIAGNOSIS — E441 Mild protein-calorie malnutrition: Secondary | ICD-10-CM | POA: Diagnosis not present

## 2016-05-26 DIAGNOSIS — E038 Other specified hypothyroidism: Secondary | ICD-10-CM | POA: Diagnosis not present

## 2016-05-26 DIAGNOSIS — D6489 Other specified anemias: Secondary | ICD-10-CM | POA: Diagnosis not present

## 2016-05-26 DIAGNOSIS — I255 Ischemic cardiomyopathy: Secondary | ICD-10-CM | POA: Diagnosis not present

## 2016-05-26 DIAGNOSIS — J449 Chronic obstructive pulmonary disease, unspecified: Secondary | ICD-10-CM | POA: Diagnosis not present

## 2016-05-27 DIAGNOSIS — E785 Hyperlipidemia, unspecified: Secondary | ICD-10-CM | POA: Diagnosis not present

## 2016-05-27 DIAGNOSIS — Z5181 Encounter for therapeutic drug level monitoring: Secondary | ICD-10-CM | POA: Diagnosis not present

## 2016-05-29 DIAGNOSIS — S72002D Fracture of unspecified part of neck of left femur, subsequent encounter for closed fracture with routine healing: Secondary | ICD-10-CM | POA: Diagnosis not present

## 2016-05-29 DIAGNOSIS — I5023 Acute on chronic systolic (congestive) heart failure: Secondary | ICD-10-CM | POA: Diagnosis not present

## 2016-05-29 DIAGNOSIS — J441 Chronic obstructive pulmonary disease with (acute) exacerbation: Secondary | ICD-10-CM | POA: Diagnosis not present

## 2016-05-29 DIAGNOSIS — B370 Candidal stomatitis: Secondary | ICD-10-CM | POA: Diagnosis not present

## 2016-05-30 ENCOUNTER — Other Ambulatory Visit: Payer: Self-pay | Admitting: *Deleted

## 2016-05-30 NOTE — Patient Outreach (Signed)
[facility]) Care Management  05/30/2016  Janice Daniels 1945-07-25 [id number]   Spoke with patient at facility regarding post discharge needs and [facility] program services.  Patient states she has oxygen dependent COPD and hx of lung cancer. She is married, depends on spouse for assistance. She states she was in the hospital for 17 days. She wants to go home asap.  Patient requests that RNCM discuss [facility] program services with her spouse. Brochure left at bedside.  Plan to attempt contact with patient spouse, Janice Daniels, [place]. Niemczura, RN, BSN, [place] acute Care Coordinator [phone]

## 2016-05-31 ENCOUNTER — Other Ambulatory Visit: Payer: Self-pay | Admitting: *Deleted

## 2016-05-31 NOTE — Patient Outreach (Signed)
Call attempted to patient spouse, Konrad Dolores as patient requested. Unable to reach. Left voicemail with RNCM contact and requested call back. Plan to follow up later [place]. Niemczura, RN, BSN, CCM [facility] post acute care coordination [phone]

## 2016-06-06 ENCOUNTER — Other Ambulatory Visit: Payer: Self-pay | Admitting: *Deleted

## 2016-06-06 DIAGNOSIS — J449 Chronic obstructive pulmonary disease, unspecified: Secondary | ICD-10-CM

## 2016-06-06 NOTE — Patient Outreach (Signed)
[facility]) Care Management  06/06/2016  BARBETTE MCGLAUN Oct 09, 1945 [id number]   Call back from patient spouse. RNCM discussed [facility] program services. He verbally consents to sign up wife for services. Discussed that a LCSW would call regarding [facility] program and would discuss options for patient wife.  Spouse states he wants wife to come home, he has had some renovations completed at the house and it will be done in another day or so. He states he was told by several at the facility that he could not take patient home before discharge date or else they may be liable for some of the costs.  Discussed that usually no co pay for first 20 days, maybe they were referring to co pay for home care services.  He appreciates the call and information and assistance through [place] to refer to [facility] LCSW for financial assessment for home care providers program Plan to refer to [facility] for TOC.after patient discharges home Planned discharge date 06/14/16 but spouse would like patient to come home earlier if possible.  Royetta Crochet. Laymond Purser, RN, BSN, [place] Post-Acute Care Coordination [phone]

## 2016-06-06 NOTE — Patient Outreach (Signed)
[facility]) Care Management  06/06/2016  Janice Daniels January 02, 1946 [id number]   Met with Janice Fairy, LCSW at facility, patient set to discharge 12/20. She will set up for patient to have home care through [facility].  Met with patient, she again states to contact spouse.  Call to patient spouse, Janice Daniels, left Voicemail requesting call.  Plan to follow up with spouse as able.   Janice Daniels. Laymond Purser, RN, BSN, [place] [phone]) Business Cell  [phone]) Toll Free Office

## 2016-06-07 ENCOUNTER — Other Ambulatory Visit: Payer: Self-pay | Admitting: Licensed Clinical Social Worker

## 2016-06-07 DIAGNOSIS — J9601 Acute respiratory failure with hypoxia: Secondary | ICD-10-CM | POA: Diagnosis not present

## 2016-06-07 DIAGNOSIS — J441 Chronic obstructive pulmonary disease with (acute) exacerbation: Secondary | ICD-10-CM | POA: Diagnosis not present

## 2016-06-07 DIAGNOSIS — C3490 Malignant neoplasm of unspecified part of unspecified bronchus or lung: Secondary | ICD-10-CM | POA: Diagnosis not present

## 2016-06-07 DIAGNOSIS — R05 Cough: Secondary | ICD-10-CM | POA: Diagnosis not present

## 2016-06-07 DIAGNOSIS — R0989 Other specified symptoms and signs involving the circulatory and respiratory systems: Secondary | ICD-10-CM | POA: Diagnosis not present

## 2016-06-07 DIAGNOSIS — I5023 Acute on chronic systolic (congestive) heart failure: Secondary | ICD-10-CM | POA: Diagnosis not present

## 2016-06-07 NOTE — Patient Outreach (Signed)
[place] [facility]) Care Management  06/07/2016  Janice Daniels 01/21/1946 [id number]   Assessment- CSW received referral on patient and has discussed patient's care and needs with [facility] Burgess Amor. RNCM feels that patient will benefit from [place] through [facility] contract after discharging from SNF. CSW completed call to patient's spouse and he answered. HIPPA verifications provided. CSW introduced self, reason for call and of [facility] services. Spouse is agreeable to social work support. Family has limited finances, support and resources and needs services in place to aide in a smooth transition back home from [facility]. CSW completed entire financial assistance form over the phone with spouse. Patient's income is $614.00 per month. However, they do not wish to apply for Medicaid as they own their home and their assets will exceed the required amount. CSW approved patient for the [facility] financial assistance program and will seek approval on the Home Provider Service referral. CSW will complete SNF visit tomorrow.  Plan-CSW will send involvement letter to PCP. CSW has updated [facility] RNCM [facility]. CSW will email request for Home Care Provider request. CSW will complete SNF visit tomorrow.   Eula Fried, BSW, MSW, [email or web address] Phone: [phone] Fax: [phone]

## 2016-06-08 ENCOUNTER — Other Ambulatory Visit: Payer: Self-pay | Admitting: Licensed Clinical Social Worker

## 2016-06-08 ENCOUNTER — Inpatient Hospital Stay (INDEPENDENT_AMBULATORY_CARE_PROVIDER_SITE_OTHER): Payer: PPO | Admitting: Orthopaedic Surgery

## 2016-06-08 ENCOUNTER — Encounter: Payer: Self-pay | Admitting: Licensed Clinical Social Worker

## 2016-06-08 NOTE — Patient Outreach (Addendum)
[place] [facility]) Care Management  [facility] Social Work  06/08/2016  Janice Daniels 09-Mar-1946 [id number]  Encounter Medications:  Outpatient Encounter Prescriptions as of 06/08/2016  Medication Sig Note  . ADVAIR DISKUS 250-50 MCG/DOSE AEPB Inhale 1 puff into the lungs 2 (two) times daily. 08/09/2015: Received from: External Pharmacy Received Sig: INHALE 1 PUFF PO BID.  Marland Kitchen albuterol (VENTOLIN HFA) 108 (90 BASE) MCG/ACT inhaler Inhale 2 puffs into the lungs every 6 (six) hours as needed for wheezing or shortness of breath.    Marland Kitchen alum & mag hydroxide-simeth (MAALOX/MYLANTA) 200-200-20 MG/5ML suspension Take 30 mLs by mouth every 4 (four) hours as needed for indigestion.   Marland Kitchen aspirin EC 81 MG EC tablet Take 1 tablet (81 mg total) by mouth daily.   Marland Kitchen enoxaparin (LOVENOX) 40 MG/0.4ML injection Inject 0.4 mLs (40 mg total) into the skin daily.   . fluconazole (DIFLUCAN) 100 MG tablet Take 1 tablet (100 mg total) by mouth daily.   Marland Kitchen gabapentin (NEURONTIN) 300 MG capsule Take 300 mg by mouth 2 (two) times daily. 02/14/2016: Received from: External Pharmacy Received Sig: TK ONE C PO  BID TO REDUCE PAIN.  Marland Kitchen ipratropium (ATROVENT HFA) 17 MCG/ACT inhaler Inhale 2 puffs into the lungs every 6 (six) hours.   Marland Kitchen ipratropium-albuterol (DUONEB) 0.5-2.5 (3) MG/3ML SOLN Take 3 mLs by nebulization every 6 (six) hours.   . levETIRAcetam (KEPPRA XR) 500 MG 24 hr tablet Take 1 tablet (500 mg total) by mouth daily.   Marland Kitchen levothyroxine (SYNTHROID, LEVOTHROID) 25 MCG tablet Take 25 mcg by mouth daily. 02/14/2016: Received from: External Pharmacy  . magic mouthwash w/lidocaine SOLN Take 15 mLs by mouth 4 (four) times daily.   Marland Kitchen omeprazole (PRILOSEC) 20 MG capsule Take 20 mg by mouth daily. 02/14/2016: Received from: External Pharmacy  . oxyCODONE-acetaminophen (PERCOCET) 5-325 MG tablet Take 1-2 tablets by mouth every 4 (four) hours as needed for severe pain.   . phenol (CHLORASEPTIC) 1.4 % LIQD Use as directed  1 spray in the mouth or throat as needed for throat irritation / pain.   . simvastatin (ZOCOR) 20 MG tablet Take 1 tablet (20 mg total) by mouth daily. PLEASE CONTACT OFFICE FOR ADDITIONAL REFILLS   . spironolactone (ALDACTONE) 25 MG tablet Take 0.5 tablets (12.5 mg total) by mouth daily. PLEASE CONTACT OFFICE FOR ADDITIONAL REFILLS   . Vitamin D, Ergocalciferol, (DRISDOL) 50000 UNITS CAPS capsule Take 50,000 Units by mouth every 7 (seven) days. Monday    No facility-administered encounter medications on file as of 06/08/2016.     Functional Status:  In your present state of health, do you have any difficulty performing the following activities: 05/10/2016 05/10/2016  Hearing? - N  Vision? - N  Difficulty concentrating or making decisions? - N  Walking or climbing stairs? - Y  Dressing or bathing? - N  Doing errands, shopping? Y -  Some recent data might be hidden    Fall/Depression Screening:  PHQ 2/9 Scores 06/08/2016  PHQ - 2 Score 0    Assessment: CSW completed initial SNF visit on 06/08/16 after receiving referral. [facility] providers wish to provide assistance to patient by having Home Care providers involved through [facility] contract after she discharges from SNF due to limited income, resources and support. CSW completed financial assistance form and received approval to make referral to [facility] Care Providers. 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 Patient is extremely tired today and was not able to  participate in physical therapy. She does not wish to sign THN consent at this time because she is so tired and wishes for her husband to sign it but spouse is not present. Patient is not able to walk. Patient is able to answer all assessment questions appropriately but drifts in and out of sleep at times during session. Patient reports that she is ready to go home.   CSW met new SNF social worker at facility. She reports that patient's scheduled discharge date is 06/14/16 and she will be receiving home health through [facility]. However, she has no further details on patient's discharge.   CSW completed call to [place] [facility] but was unable to reach them. CSW left a HIPPA compliant voice message requesting return call. CSW then received a return call from agency and completed referral for [facility]. Patient will start to receive caregiver services either 06/15/16 or 06/16/16. [facility] will provide nursing services for 30 days.  Plan: CSW will route encounter to PCP. CSW will continue to provide social work assistance. Home Care Provider referral successfully completed.  [facility] CM Care Plan Problem One   Flowsheet Row Most Recent Value  Care Plan Problem One  Lack of community resources, services and support  Role Documenting the Problem One  Clinical Social Worker  Care Plan for Problem One  Active  THN Long Term Goal (31-90 days)  Family will gain education on financial resources, senior resources and personal care resources within 90 days  [facility] Long Term Goal Start Date  06/07/16  Interventions for Problem One Long Term Goal  CSW will complete SNF visit and will educate family on needed resources, make referrals as needed, assist with applications if needed and provide handouts for them to review back on.  THN CM Short Term Goal #1 (0-30 days)  Patient will start receiving Home Care Provider services for 30 days after she discharges from SNF  [facility] CM Short Term Goal #1 Start  Date  06/08/16  Interventions for Short Term Goal #1  CSW completed FAF form and received approval from [facility]. CSW completed referral to [facility] and services should start after she discharges from SNF.  THN CM Short Term Goal #2 (0-30 days)  Patient will gain [place] services through [facility] after discharge from SNF  [facility] CM Short Term Goal #2 Start Date  06/08/16  Interventions for Short Term Goal #2  CSW completed SNF visit and family has chosen [facility] as their home health agency. CSW will coordinate services with Home Health if needed. CSW will ensure that patient has all needed medical equipment post SNF discharge.       Eula Fried, BSW, MSW, [email or web address] Phone: [phone] Fax: [phone]

## 2016-06-13 ENCOUNTER — Other Ambulatory Visit: Payer: Self-pay | Admitting: Licensed Clinical Social Worker

## 2016-06-13 ENCOUNTER — Other Ambulatory Visit: Payer: Self-pay | Admitting: *Deleted

## 2016-06-13 NOTE — Patient Outreach (Signed)
[place] [facility]) Care Management  06/13/2016  Julea Hutto Foulk 1946/02/19 [id number]   Assessment- CSW received notification from [place] that patient had discharged from SNF yesterday instead of 06/14/16. CSW completed call to [facility] and left a message for April regarding this update.  Plan-CSW will await for return call from [place] [facility].  Eula Fried, BSW, MSW, [email or web address] Phone: [phone] Fax: [phone]

## 2016-06-13 NOTE — Patient Outreach (Signed)
[place] [facility]) Care Management  06/13/2016  TORIE TOWLE 11-02-1945 [id number]  Assessment- CSW completed call to SNF social worker at [facility] to get updates on patient's discharge. CSW was unable to reach her but left a HIPPA compliant voice message requesting return call with updates.  [facility] will be contacting family this week to schedule assessment.  Plan-CSW will await to hear back from SNF social worker or will follow up within 10 days.  Eula Fried, BSW, MSW, [email or web address] Phone: [phone] Fax: [phone]

## 2016-06-13 NOTE — Patient Outreach (Signed)
[place] [facility]) Care Management  06/13/2016  Janice Daniels [birth year] [id number]   Spoke with Jacqlyn Larsen at facility, she states spouse took patient home yesterday. Plan to sign off and make [facility] community LCSW aware of discharge home.  Royetta Crochet. Laymond Purser, RN, BSN, CCM  [facility] post-acute care coordinator [phone]

## 2016-06-14 ENCOUNTER — Other Ambulatory Visit: Payer: Self-pay | Admitting: Licensed Clinical Social Worker

## 2016-06-14 DIAGNOSIS — J441 Chronic obstructive pulmonary disease with (acute) exacerbation: Secondary | ICD-10-CM

## 2016-06-14 NOTE — Patient Outreach (Addendum)
[place] [facility]) Care Management  06/14/2016  Janice Daniels 03/25/1946 [id number]   Assessment- CSW completed outreach to patient's residence and spouse answered. He reports that he took patient home on 06/12/16 because he only had help on that day and could not transport her home alone. He reports that he has not heard from [place] [facility] yet. CSW provided contact information and he wrote it down. He states that no medical equipment was ordered. CSW will make referral to [place]. CSW completed call to [facility] and left a message with SNF social worker to ensure that physical therapy did not recommend any medical equipment. CSW questioned if CSW could complete home visit this week but spouse states "I have so many doctor appointments that I have to take her to so it might be best to come after the holidays."  Plan-CSW will await for return call from SNF social worker. CSW will follow up with family within one week or less.  Eula Fried, BSW, MSW, [email or web address] Phone: [phone] Fax: [phone]

## 2016-06-15 ENCOUNTER — Ambulatory Visit (INDEPENDENT_AMBULATORY_CARE_PROVIDER_SITE_OTHER): Payer: PPO

## 2016-06-15 ENCOUNTER — Ambulatory Visit (INDEPENDENT_AMBULATORY_CARE_PROVIDER_SITE_OTHER): Payer: PPO | Admitting: Orthopaedic Surgery

## 2016-06-15 ENCOUNTER — Encounter (INDEPENDENT_AMBULATORY_CARE_PROVIDER_SITE_OTHER): Payer: Self-pay | Admitting: Orthopaedic Surgery

## 2016-06-15 ENCOUNTER — Other Ambulatory Visit: Payer: Self-pay | Admitting: Licensed Clinical Social Worker

## 2016-06-15 DIAGNOSIS — I5023 Acute on chronic systolic (congestive) heart failure: Secondary | ICD-10-CM | POA: Diagnosis not present

## 2016-06-15 DIAGNOSIS — S72002D Fracture of unspecified part of neck of left femur, subsequent encounter for closed fracture with routine healing: Secondary | ICD-10-CM

## 2016-06-15 DIAGNOSIS — C3490 Malignant neoplasm of unspecified part of unspecified bronchus or lung: Secondary | ICD-10-CM | POA: Diagnosis not present

## 2016-06-15 DIAGNOSIS — E038 Other specified hypothyroidism: Secondary | ICD-10-CM | POA: Diagnosis not present

## 2016-06-15 DIAGNOSIS — N179 Acute kidney failure, unspecified: Secondary | ICD-10-CM | POA: Diagnosis not present

## 2016-06-15 DIAGNOSIS — F17200 Nicotine dependence, unspecified, uncomplicated: Secondary | ICD-10-CM | POA: Diagnosis not present

## 2016-06-15 DIAGNOSIS — R0902 Hypoxemia: Secondary | ICD-10-CM | POA: Diagnosis not present

## 2016-06-15 DIAGNOSIS — R2681 Unsteadiness on feet: Secondary | ICD-10-CM | POA: Diagnosis not present

## 2016-06-15 DIAGNOSIS — Z9889 Other specified postprocedural states: Secondary | ICD-10-CM | POA: Diagnosis not present

## 2016-06-15 DIAGNOSIS — Z87891 Personal history of nicotine dependence: Secondary | ICD-10-CM | POA: Diagnosis not present

## 2016-06-15 DIAGNOSIS — G40109 Localization-related (focal) (partial) symptomatic epilepsy and epileptic syndromes with simple partial seizures, not intractable, without status epilepticus: Secondary | ICD-10-CM | POA: Diagnosis not present

## 2016-06-15 DIAGNOSIS — E871 Hypo-osmolality and hyponatremia: Secondary | ICD-10-CM | POA: Diagnosis not present

## 2016-06-15 DIAGNOSIS — D62 Acute posthemorrhagic anemia: Secondary | ICD-10-CM | POA: Diagnosis not present

## 2016-06-15 DIAGNOSIS — J9620 Acute and chronic respiratory failure, unspecified whether with hypoxia or hypercapnia: Secondary | ICD-10-CM | POA: Diagnosis not present

## 2016-06-15 NOTE — Patient Outreach (Signed)
[place] [facility]) Care Management  06/15/2016  Janice Daniels 1945/08/09 [id number]  Assessment- CSW received voice message from [facility] with [facility]. He stated that he spoke to patient's spouse but that patient's spouse was very hesitant in regards to allowing them to provide services. Helene Kelp states that he provided education on their program and the benefits of having an aide involved during patient's transition back home from SNF. Helene Kelp states that he feels spouse is overwhelmed with having so many people involved and that patient's spouse wishes to discuss this with patient's PCP today during appointment before making a decision and will contact him back tomorrow. CSW completed call to back to [place] but as unable to reach him. HIPPA compliant voice message was left. CSW completed call to patient's residence but was unable to speak to spouse. CSW left a HIPPA compliant voice message requesting return call.  CSW received return call from [place] with [facility] and was informed that patient contacted Helene Kelp last evening and stated that he was agreeable to services now as he had concerns that he would not be able to afford service and Helene Kelp explained that services were being paid for by [facility]. Helene Kelp was trying to schedule home assessment appointment for today as he will be out of the office for the Holidays starting 06/16/16-06/19/16 but patient as unwilling to schedule appointment and preferred for appointment to be scheduled next week. Home Care Provider services will start next week.  Plan-CSW will follow up with family within one week.  Eula Fried, BSW, MSW, [email or web address] Phone: [phone] Fax: [phone]

## 2016-06-15 NOTE — Progress Notes (Signed)
Patient is 4 weeks s/p IM nail of left intertroch hip fx.  She's not walking at all due to deconditioning.  Denies any pain. Living at SNF.  Incisions are healed without signs of infection.  xrays show stable fixation.  Patient is stable from my stand point.  Needs to continue to work with PT.  I don't think she will rehab from his injury given her multiple medical co morbidities and overall depressed state.  She lacks motivation to get stronger.  F/u in 2 months with repeat left hip xrays.

## 2016-06-16 ENCOUNTER — Other Ambulatory Visit: Payer: Self-pay

## 2016-06-16 ENCOUNTER — Other Ambulatory Visit: Payer: Self-pay | Admitting: Licensed Clinical Social Worker

## 2016-06-16 DIAGNOSIS — J9620 Acute and chronic respiratory failure, unspecified whether with hypoxia or hypercapnia: Secondary | ICD-10-CM | POA: Diagnosis not present

## 2016-06-16 DIAGNOSIS — R112 Nausea with vomiting, unspecified: Secondary | ICD-10-CM | POA: Diagnosis not present

## 2016-06-16 DIAGNOSIS — J449 Chronic obstructive pulmonary disease, unspecified: Secondary | ICD-10-CM | POA: Diagnosis not present

## 2016-06-16 DIAGNOSIS — J9611 Chronic respiratory failure with hypoxia: Secondary | ICD-10-CM | POA: Diagnosis not present

## 2016-06-16 NOTE — Patient Outreach (Signed)
   Unsuccessful attempt made to contact patient via telephone for transition of care. Will make another attempt to contact patient later this month.

## 2016-06-16 NOTE — Patient Outreach (Signed)
[place] [facility]) Care Management  06/16/2016  EKATERINI CAPITANO 08-Jul-1945 [id number]  Assessment- CSW completed call to patent's residence and spouse answered. He reports that patient is not doing well and having difficulty breathing. He states that he has no oxygen for patient and is waiting to hear back from her physician in regards to this. He states that he is experiencing caregiver burn out symptoms. CSW would have hoped [facility] from [place] would be able to complete visit this week but patient's spouse did not allow him. Helene Kelp will be contacting spouse on 06/20/16 to schedule home visit and assessment. Spouse states that he will be looking for a caregiver to help assist him that he can privately pay. CSW provided personal care resources and mentioned [facility] and [employer] but patient's spouse reports he rather discuss this later. Spouse was educated on available community resources within [facility] to assist patient. Spouse is agreeable to follow up within 1 week. He is aware that [facility] office will be closed on 06/19/16 for the Holiday.   Plan-CSW will follow up with family within 1 week and continue to provide social work assistance.  Eula Fried, BSW, MSW, [email or web address] Phone: [phone] Fax: [phone]

## 2016-06-20 ENCOUNTER — Other Ambulatory Visit: Payer: Self-pay | Admitting: Licensed Clinical Social Worker

## 2016-06-20 ENCOUNTER — Other Ambulatory Visit: Payer: Self-pay

## 2016-06-20 NOTE — Patient Outreach (Signed)
[place] [facility] [facility]) Care Management  06/20/2016  Janice Daniels 1945/10/14 [id number]   Assessment- CSW completed call to patient's residence and spouse answered. CSW questioned if he has spoke to [place] with [facility] and he reported that [place] had just arrived and he would need to get off the phone.   Plan-CSW will follow up within a few days to ensure Home Care Provider services has been started.  Eula Fried, BSW, MSW, [email or web address] Phone: [phone] Fax: [phone]

## 2016-06-20 NOTE — Patient Outreach (Signed)
[place] [facility]) Care Management  06/20/2016  Janice Daniels 06-Mar-1946 [id number]   Assessment- CSW received call from [facility] with [facility]. He states that he is with family now completing assessment. He reports that he is concerned about her mobility and lack of medical equipment. He also states that Home Health is NOT involved. He reports that patient's spouse is having a difficulty time transferring patient.   CSW left a message for PCP with these concerns.   CSW left a message with [facility] social worker to express these concerns as well.  Plan-CSW contacted [place] with this update. CSW will await for return call from providers.  Eula Fried, BSW, MSW, [email or web address] Phone: [phone] Fax: [phone]

## 2016-06-20 NOTE — Patient Outreach (Signed)
[place] [facility]) Care Management  06/20/2016   Janice Daniels 1946-03-07 [id number]  Subjective:  Telephonic contact with patient's husband who reports exhaustion.  Patient's husband also reports Helene Kelp from home care agency has been by, states agency will provide services for 30 days   Objective:  Telephonic encounter  Current Medications:  Current Outpatient Prescriptions  Medication Sig Dispense Refill  . ADVAIR DISKUS 250-50 MCG/DOSE AEPB Inhale 1 puff into the lungs 2 (two) times daily.  2  . albuterol (VENTOLIN HFA) 108 (90 BASE) MCG/ACT inhaler Inhale 2 puffs into the lungs every 6 (six) hours as needed for wheezing or shortness of breath.     Marland Kitchen alum & mag hydroxide-simeth (MAALOX/MYLANTA) 200-200-20 MG/5ML suspension Take 30 mLs by mouth every 4 (four) hours as needed for indigestion. 355 mL 0  . aspirin EC 81 MG EC tablet Take 1 tablet (81 mg total) by mouth daily.    Marland Kitchen enoxaparin (LOVENOX) 40 MG/0.4ML injection Inject 0.4 mLs (40 mg total) into the skin daily. 14 Syringe 0  . fluconazole (DIFLUCAN) 100 MG tablet Take 1 tablet (100 mg total) by mouth daily. 14 tablet 0  . gabapentin (NEURONTIN) 300 MG capsule Take 300 mg by mouth 2 (two) times daily.  3  . ipratropium (ATROVENT HFA) 17 MCG/ACT inhaler Inhale 2 puffs into the lungs every 6 (six) hours.    Marland Kitchen ipratropium-albuterol (DUONEB) 0.5-2.5 (3) MG/3ML SOLN Take 3 mLs by nebulization every 6 (six) hours. 360 mL 0  . levETIRAcetam (KEPPRA XR) 500 MG 24 hr tablet Take 1 tablet (500 mg total) by mouth daily. 30 tablet 0  . levothyroxine (SYNTHROID, LEVOTHROID) 25 MCG tablet Take 25 mcg by mouth daily.    . magic mouthwash w/lidocaine SOLN Take 15 mLs by mouth 4 (four) times daily.  0  . omeprazole (PRILOSEC) 20 MG capsule Take 20 mg by mouth daily.    Marland Kitchen oxyCODONE-acetaminophen (PERCOCET) 5-325 MG tablet Take 1-2 tablets by mouth every 4 (four) hours as needed for severe pain. 90 tablet 0  . phenol  (CHLORASEPTIC) 1.4 % LIQD Use as directed 1 spray in the mouth or throat as needed for throat irritation / pain.  0  . simvastatin (ZOCOR) 20 MG tablet Take 1 tablet (20 mg total) by mouth daily. PLEASE CONTACT OFFICE FOR ADDITIONAL REFILLS 30 tablet 0  . spironolactone (ALDACTONE) 25 MG tablet Take 0.5 tablets (12.5 mg total) by mouth daily. PLEASE CONTACT OFFICE FOR ADDITIONAL REFILLS 15 tablet 0  . Vitamin D, Ergocalciferol, (DRISDOL) 50000 UNITS CAPS capsule Take 50,000 Units by mouth every 7 (seven) days. Monday     No current facility-administered medications for this visit.     Functional Status:  In your present state of health, do you have any difficulty performing the following activities: 05/10/2016 05/10/2016  Hearing? - N  Vision? - N  Difficulty concentrating or making decisions? - N  Walking or climbing stairs? - Y  Dressing or bathing? - N  Doing errands, shopping? Y -  Some recent data might be hidden    Fall/Depression Screening: PHQ 2/9 Scores 06/08/2016  PHQ - 2 Score 0   THN CM Care Plan Problem One   Flowsheet Row Most Recent Value  Care Plan Problem One  patient recently discharged from skilled nursing facility  Role Documenting the Problem One  Care Management [place] for Problem One  Active  THN Long Term Goal (31-90 days)  In the next 31 days, patient  will have no acute care admissions   [facility] Long Term Goal Start Date  06/20/16  Interventions for Problem One Long Term Goal  Initial telephone contct for assessment of needs  THN CM Short Term Goal #1 (0-30 days)  In the next 28 days, patient will meet with this [place] fo rchronic disease education  [facility] CM Short Term Goal #1 Start Date  06/20/16  Interventions for Short Term Goal #1  [facility] RNCM completed initial telephone contact with patient's caregiver husband     Assessment:  Patient 's husband reports exhaustion due to being up awhile last night. Patient has in home  adie service starting this week.   Plan:  Telephone contact within hte next 30 days for community care coordination

## 2016-06-21 ENCOUNTER — Other Ambulatory Visit: Payer: Self-pay | Admitting: Licensed Clinical Social Worker

## 2016-06-21 NOTE — Patient Outreach (Addendum)
[facility]) Care Management  06/21/2016  Janice Daniels February 09, 1946 [id number]   Assessment- CSW received incoming call from social worker at [facility] SNF. She reports that spouse came to their SNF and was very rude and abrupt. She states that he demanded that patient go home that day (before her scheduled discharge) because it was the only date that he could transport her back home. Social worker states that they tried to persuade him to wait until her scheduled discharge date because she was not ready to be discharged back home due to safety. She states that patient's spouse denied needing medical equipment. She states that physical therapy had requested that she discharge home with oxygen but that spouse stated "She can just use mine. We don't need it." She states that the only thing they were able to provide was [facility] order. However, when they contacted spouse he declined services. CSW completed call to [facility] and staff member stated that patient would need to be referred to their service by patient's PCP.  CSW completed call back to spouse. He answered. He reports "They are lying. I did not refuse service." Spouse is very upset because he received a $1,800 from SNF. He states that he is very overwhelmed. CSW provided contact number for [facility] agency and PCP and encouraged him to contact PCP office and request [facility] order and discuss need for medical equipment.  CSW will send this update to [place].  Plan-CSW will follow up within one week.  Eula Fried, BSW, MSW, [email or web address] Phone: [phone] Fax: [phone]

## 2016-06-21 NOTE — Patient Outreach (Signed)
[place] [facility]) Care Management  06/21/2016  ANNITTA FIFIELD 1946-06-08 [id number]   Assessment- CSW completed call to patient's residence and spouse answered. He reports that patient is "doing the same." He states that an aide with [place] [facility] will be coming today to assist. He states that he received a call from someone yesterday but he told them to "hold off on services." Spouse states that they called from [place]. CSW completed call to [place] and they confirmed that they did not contact patient for services. Spouse was able to find number that contacted him yesterday and it was [place]. Spouse wanted information on [facility] services again and wanted to confirm that we do not cost to him. Spouse remains agreeable to [facility] RNCM and CSW services. CSW encouraged patient to contact PCP's office to inform them that [facility] is not involved. Spouse is agreeable to do so. Spouse is not agreeable to a home visit this week and rather schedule it at a different time.  Plan-CSW will follow up within one week.  Eula Fried, BSW, MSW, [email or web address] Phone: [phone] Fax: [phone]

## 2016-06-22 ENCOUNTER — Ambulatory Visit: Payer: Self-pay

## 2016-06-22 ENCOUNTER — Other Ambulatory Visit: Payer: Self-pay | Admitting: Licensed Clinical Social Worker

## 2016-06-22 NOTE — Patient Outreach (Signed)
[facility]) Care Management  06/22/2016  NEEKA URISTA February 23, 1946 [id number]   Assessment- CSW received return message from DJ, [place] for PCP. She states that they are aware of what is going on. She states that patient was seen for a transition of care visit on 06/15/16. She states that patient was taken out of SNF against medical advice. She states that patient was not participating in physical therapy which transitioned her into long term care but spouse was not willing to allow her to stay. She reports that they ordered oxygen, neubulizer, and HH after patient's TOC visit. She states that she has been on the phone with spouse and reviewed patient's medications. HH was ordered on 06/13/16 and spouse declines services.   CSW completed call back to DJ but she is currently out of the office. CSW will return call tomorrow or next week.  CSW completed call to [facility] with [facility] and provided update.  Plan-CSW will continue to follow up and provide social work assistance.  Eula Fried, BSW, MSW, [email or web address] Phone: [phone] Fax: [phone]

## 2016-06-27 ENCOUNTER — Other Ambulatory Visit: Payer: Self-pay | Admitting: Licensed Clinical Social Worker

## 2016-06-27 ENCOUNTER — Other Ambulatory Visit: Payer: Self-pay

## 2016-06-27 NOTE — Patient Outreach (Addendum)
[facility]) Care Management   06/27/2016  Janice Daniels 1945-10-15 [id number]  Janice Daniels is an 71 y.o. female  Subjective:  I dont have any help but my husband. I did not get any help in the rehab center. My husband is doing it all for me.  Objective:   ROS  Elderly, frail lady lying in bed I the prone position. Elderly lady with oxygen being administered via nasal cannual  Physical Exam  ROS  Encounter Medications:   Outpatient Encounter Prescriptions as of 06/27/2016  Medication Sig Note  . ADVAIR DISKUS 250-50 MCG/DOSE AEPB Inhale 1 puff into the lungs 2 (two) times daily. 08/09/2015: Received from: External Pharmacy Received Sig: INHALE 1 PUFF PO BID.  Marland Kitchen albuterol (VENTOLIN HFA) 108 (90 BASE) MCG/ACT inhaler Inhale 2 puffs into the lungs every 6 (six) hours as needed for wheezing or shortness of breath.    Marland Kitchen alum & mag hydroxide-simeth (MAALOX/MYLANTA) 200-200-20 MG/5ML suspension Take 30 mLs by mouth every 4 (four) hours as needed for indigestion.   Marland Kitchen aspirin EC 81 MG EC tablet Take 1 tablet (81 mg total) by mouth daily.   Marland Kitchen enoxaparin (LOVENOX) 40 MG/0.4ML injection Inject 0.4 mLs (40 mg total) into the skin daily.   . fluconazole (DIFLUCAN) 100 MG tablet Take 1 tablet (100 mg total) by mouth daily.   Marland Kitchen gabapentin (NEURONTIN) 300 MG capsule Take 300 mg by mouth 2 (two) times daily. 02/14/2016: Received from: External Pharmacy Received Sig: TK ONE C PO  BID TO REDUCE PAIN.  Marland Kitchen ipratropium (ATROVENT HFA) 17 MCG/ACT inhaler Inhale 2 puffs into the lungs every 6 (six) hours.   Marland Kitchen ipratropium-albuterol (DUONEB) 0.5-2.5 (3) MG/3ML SOLN Take 3 mLs by nebulization every 6 (six) hours.   . levETIRAcetam (KEPPRA XR) 500 MG 24 hr tablet Take 1 tablet (500 mg total) by mouth daily.   Marland Kitchen levothyroxine (SYNTHROID, LEVOTHROID) 25 MCG tablet Take 25 mcg by mouth daily. 02/14/2016: Received from: External Pharmacy  . magic mouthwash w/lidocaine SOLN Take 15 mLs by  mouth 4 (four) times daily.   Marland Kitchen omeprazole (PRILOSEC) 20 MG capsule Take 20 mg by mouth daily. 02/14/2016: Received from: External Pharmacy  . oxyCODONE-acetaminophen (PERCOCET) 5-325 MG tablet Take 1-2 tablets by mouth every 4 (four) hours as needed for severe pain.   . phenol (CHLORASEPTIC) 1.4 % LIQD Use as directed 1 spray in the mouth or throat as needed for throat irritation / pain.   . simvastatin (ZOCOR) 20 MG tablet Take 1 tablet (20 mg total) by mouth daily. PLEASE CONTACT OFFICE FOR ADDITIONAL REFILLS   . spironolactone (ALDACTONE) 25 MG tablet Take 0.5 tablets (12.5 mg total) by mouth daily. PLEASE CONTACT OFFICE FOR ADDITIONAL REFILLS   . Vitamin D, Ergocalciferol, (DRISDOL) 50000 UNITS CAPS capsule Take 50,000 Units by mouth every 7 (seven) days. Monday    No facility-administered encounter medications on file as of 06/27/2016.     Functional Status:   In your present state of health, do you have any difficulty performing the following activities: 05/10/2016 05/10/2016  Hearing? - N  Vision? - N  Difficulty concentrating or making decisions? - N  Walking or climbing stairs? - Y  Dressing or bathing? - N  Doing errands, shopping? Y -  Some recent data might be hidden    Fall/Depression Screening:    PHQ 2/9 Scores 06/08/2016  PHQ - 2 Score 0    Assessment:   Patient and husband both expressed needing assistance  with getting HHPT/OT/AIDE at home [facility] made call to Dr. Buel Ream office (he is off today), left message on voice mail of [place] requesting assistance with obtaining [place]. RNCM made call to [place] advising them of patient and husband request for HHPT, left message for intake nurse     [facility] CM Care Plan Problem One   Flowsheet Row Most Recent Value  Care Plan Problem One  patient recently discharged from skilled nursing facility  Role Documenting the Problem One  Care Management [place] for Problem One  Active  THN Long Term Goal (31-90 days)  In  the next 31 days, patient will have no acute care admissions   [facility] Long Term Goal Start Date  06/20/16  Interventions for Problem One Long Term Goal  initial home visit for assessment of community care coordination   THN CM Short Term Goal #1 (0-30 days)  In the next 28 days, patient will meet with this [facility] Coordinator fo rchronic disease education  [facility] CM Short Term Goal #1 Start Date  06/20/16  [facility] CM Short Term Goal #1 Met Date  06/27/16  Interventions for Short Term Goal #1  Patient states she is not interested, she knows all she needs to know about COPD, she has all the medicines, has oxygen and understand the use of all     Plan:   Home visit later this month to follow up with case management needs, assess need for COPD education

## 2016-06-27 NOTE — Patient Outreach (Signed)
[place] [facility]) Care Management  06/27/2016  REX MAGEE 1945-11-12 [id number]   Assessment- CSW completed outreach call to patient. Patient answered. He reports that patient is doing "a little better." He stated that patient is eating on her own and seems to have "more strength." CSW scheduled home visit for tomorrow.  Plan-CSW will complete home visit tomorrow on 06/29/15.  Eula Fried, BSW, MSW, [email or web address] Phone: [phone] Fax: [phone]

## 2016-06-28 ENCOUNTER — Other Ambulatory Visit: Payer: Self-pay | Admitting: Licensed Clinical Social Worker

## 2016-06-28 NOTE — Patient Outreach (Signed)
[facility]) Care Management  06/28/2016  Janice Daniels 09-Sep-1945 [id number]   Assessment- CSW completed call to patient's residence and spouse answered. CSW informed spouse that CSW will need to reschedule appointment today. Spouse is agreeable to rescheduling appointment for tomorrow but expresses his frustration over so many people contacting him and coming to his home. CSW expressed her understanding and asked if CSW would need to reschedule appointment for another day instead of tomorrow but spouse declined. He reports that aide from [place] [facility] will be coming today at 2:00 pm and staying for a few hours.  Plan-CSW will complete home visit tomorrow and provide social work assistance.   Eula Fried, BSW, MSW, [email or web address] Phone: [phone] Fax: [phone]

## 2016-06-29 ENCOUNTER — Other Ambulatory Visit: Payer: Self-pay | Admitting: Licensed Clinical Social Worker

## 2016-06-29 NOTE — Patient Outreach (Addendum)
[facility]) Care Management  [facility] Social Work  06/29/2016  Janice Daniels 1946/03/05 [id number]   Encounter Medications:  Outpatient Encounter Prescriptions as of 06/29/2016  Medication Sig Note  . ADVAIR DISKUS 250-50 MCG/DOSE AEPB Inhale 1 puff into the lungs 2 (two) times daily. 08/09/2015: Received from: External Pharmacy Received Sig: INHALE 1 PUFF PO BID.  Marland Kitchen albuterol (VENTOLIN HFA) 108 (90 BASE) MCG/ACT inhaler Inhale 2 puffs into the lungs every 6 (six) hours as needed for wheezing or shortness of breath.    Marland Kitchen alum & mag hydroxide-simeth (MAALOX/MYLANTA) 200-200-20 MG/5ML suspension Take 30 mLs by mouth every 4 (four) hours as needed for indigestion.   Marland Kitchen aspirin EC 81 MG EC tablet Take 1 tablet (81 mg total) by mouth daily.   Marland Kitchen enoxaparin (LOVENOX) 40 MG/0.4ML injection Inject 0.4 mLs (40 mg total) into the skin daily.   . fluconazole (DIFLUCAN) 100 MG tablet Take 1 tablet (100 mg total) by mouth daily.   Marland Kitchen gabapentin (NEURONTIN) 300 MG capsule Take 300 mg by mouth 2 (two) times daily. 02/14/2016: Received from: External Pharmacy Received Sig: TK ONE C PO  BID TO REDUCE PAIN.  Marland Kitchen ipratropium (ATROVENT HFA) 17 MCG/ACT inhaler Inhale 2 puffs into the lungs every 6 (six) hours.   Marland Kitchen ipratropium-albuterol (DUONEB) 0.5-2.5 (3) MG/3ML SOLN Take 3 mLs by nebulization every 6 (six) hours.   . levETIRAcetam (KEPPRA XR) 500 MG 24 hr tablet Take 1 tablet (500 mg total) by mouth daily.   Marland Kitchen levothyroxine (SYNTHROID, LEVOTHROID) 25 MCG tablet Take 25 mcg by mouth daily. 02/14/2016: Received from: External Pharmacy  . magic mouthwash w/lidocaine SOLN Take 15 mLs by mouth 4 (four) times daily.   Marland Kitchen omeprazole (PRILOSEC) 20 MG capsule Take 20 mg by mouth daily. 02/14/2016: Received from: External Pharmacy  . oxyCODONE-acetaminophen (PERCOCET) 5-325 MG tablet Take 1-2 tablets by mouth every 4 (four) hours as needed for severe pain.   . phenol (CHLORASEPTIC) 1.4 % LIQD Use as directed 1  spray in the mouth or throat as needed for throat irritation / pain.   . simvastatin (ZOCOR) 20 MG tablet Take 1 tablet (20 mg total) by mouth daily. PLEASE CONTACT OFFICE FOR ADDITIONAL REFILLS   . spironolactone (ALDACTONE) 25 MG tablet Take 0.5 tablets (12.5 mg total) by mouth daily. PLEASE CONTACT OFFICE FOR ADDITIONAL REFILLS   . Vitamin D, Ergocalciferol, (DRISDOL) 50000 UNITS CAPS capsule Take 50,000 Units by mouth every 7 (seven) days. Monday    No facility-administered encounter medications on file as of 06/29/2016.     Functional Status:  In your present state of health, do you have any difficulty performing the following activities: 06/29/2016 05/10/2016  Hearing? N -  Vision? N -  Difficulty concentrating or making decisions? Y -  Walking or climbing stairs? Y -  Dressing or bathing? Y -  Doing errands, shopping? Malvin Johns  Some recent data might be hidden    Fall/Depression Screening:  PHQ 2/9 Scores 06/29/2016 06/08/2016  PHQ - 2 Score 0 0    Assessment: CSW completed home visit on 06/29/16 with patient and spouse present during session. Patient was lying in her bed watching television when CSW arrived. Patient appears to be doing better and is able to communicate now. She is able to state her name, DOB and address. She successfully signed [facility] consent. She reports that she is still not walking. She has to have help to get out of her bed. She states "I just lay  here day in and day out because I'm so weak." She reports that [facility] are providing services to her. She states that they assist her with bathing, eating, using the bathroom and changing her incontinence briefs. She reports that they have been very helpful to her. CSW expressed worry for when their services end. She states that she feels "We will be just fine. Janice Daniels (spouse) can do everything they are doing now and I hope to be doing a lot better by then." Patient reports that her appetite and sleeping "are getting better."  She denies being in any pain or discomofrt today. She states that she continues to have difficulty breathing. She shares that her spouse will not give her a cigarette since her hospitalization but that she is dealing with cravings. CSW provided 1-800-Quit-Now number and information. She reports that all of her sons came to visit her over the holiday. She states that her sons do not live nearby and are unable to provide assistance but they contact her by phone to check on her. Patient's sister contacted her during session to check on her as well. Patient reports that she is has been lifting her legs up in order to gain strength. She denies receiving a call from [facility]. However, during session [facility] called and spoke to patient. Patient's spouse became very upset while patient was on the phone telling her to "hand the phone to me. They only need to talk to me." He informed patient not to give any of her information out and to allow him to speak to Home Health. Spouse then spoke to [employer] and reports that it was a speech therapist from [facility] who scheduled assessment appointment for tomorrow. He reports that he was worried that it was a company that was going to cost. Patient's spouse has declined services with [facility] before because he felt that the service would cost him. CSW provided education on Home Health. Spouse is very suspicious of agencies contacting him for patient. Patient admits that patient is very controlling due to finances but that her spouse takes very good care of her. CSW does not see any signs of neglect or abuse.  CSW provided financial resources and reviewed them with family. Family is not agreeable to applying for Medicaid because they own their home. However, spouse was appreciative to financial resources ([employer] in particular.) CSW also provided [employer] and reviewed resources. Family denies needing Mobile Meals at this time.  Family denies needing transportation services. Family denies needing private pay caregiver services. CSW provided handouts on [facility] [place] and [facility] and provided education on each program, their cost and wait lsit. Family is only agreeable to [facility] services. CSW will make referral. CSW educated family on LTC placement options but family IS NOT agreeable to LTC placement and does not wish to have any further information on ILF,ALF and Nursing Home placement.   CSW completed referral successfully to [facility] program.  Plan-CSW will follow up within two weeks. CSW will continue to provide social work assistance.   [facility] CM Care Plan Problem One   Flowsheet Row Most Recent Value  Care Plan Problem One  Lack of community resources, services and support  Role Documenting the Problem One  Clinical Social Worker  Care Plan for Problem One  Active  THN Long Term Goal (31-90 days)  Family will gain education on financial resources, senior resources and personal care resources within 90 days  [facility]  Long Term Goal Start Date  06/07/16  Interventions for Problem One Long Term Goal  CSW will complete SNF visit and will educate family on needed resources, make referrals as needed, assist with applications if needed and provide handouts for them to review back on.  THN CM Short Term Goal #1 (0-30 days)  Patient will start receiving Home Care Provider services for 30 days after she discharges from SNF  [facility] CM Short Term Goal #1 Start Date  06/08/16  [facility] CM Short Term Goal #1 Met Date  06/29/16  Interventions for Short Term Goal #1  Goal met. Services started last week.  THN CM Short Term Goal #2 (0-30 days)  Patient will gain [place] services through [facility] after discharge from SNF  [facility] CM Short Term Goal #2 Start Date  06/29/16  Interventions for Short Term Goal #2  Goal not met but will be continued on. [facility] contacted family today during home [place], [place], MSW, [email or web address] Phone: [phone] Fax: [phone]

## 2016-07-04 ENCOUNTER — Other Ambulatory Visit: Payer: Self-pay

## 2016-07-04 NOTE — Patient Outreach (Signed)
[place] [facility]) Care Management  07/04/2016   Janice Daniels 09-17-1945 [id number]  Subjective:  RNCM spoke with husband, Janice Daniels, who reports improvement in patient's pain, Janice Daniels also reported patient is feeling a lot better. Janice Daniels further reported he does not want any surveys that willl cost, he states he has refused services from [facility] due to the $25.00 copayment.  Objective:  telephonic  Current Medications:  Current Outpatient Prescriptions  Medication Sig Dispense Refill  . ADVAIR DISKUS 250-50 MCG/DOSE AEPB Inhale 1 puff into the lungs 2 (two) times daily.  2  . albuterol (VENTOLIN HFA) 108 (90 BASE) MCG/ACT inhaler Inhale 2 puffs into the lungs every 6 (six) hours as needed for wheezing or shortness of breath.     Marland Kitchen alum & mag hydroxide-simeth (MAALOX/MYLANTA) 200-200-20 MG/5ML suspension Take 30 mLs by mouth every 4 (four) hours as needed for indigestion. 355 mL 0  . aspirin EC 81 MG EC tablet Take 1 tablet (81 mg total) by mouth daily.    Marland Kitchen enoxaparin (LOVENOX) 40 MG/0.4ML injection Inject 0.4 mLs (40 mg total) into the skin daily. 14 Syringe 0  . fluconazole (DIFLUCAN) 100 MG tablet Take 1 tablet (100 mg total) by mouth daily. 14 tablet 0  . gabapentin (NEURONTIN) 300 MG capsule Take 300 mg by mouth 2 (two) times daily.  3  . ipratropium (ATROVENT HFA) 17 MCG/ACT inhaler Inhale 2 puffs into the lungs every 6 (six) hours.    Marland Kitchen ipratropium-albuterol (DUONEB) 0.5-2.5 (3) MG/3ML SOLN Take 3 mLs by nebulization every 6 (six) hours. 360 mL 0  . levETIRAcetam (KEPPRA XR) 500 MG 24 hr tablet Take 1 tablet (500 mg total) by mouth daily. 30 tablet 0  . levothyroxine (SYNTHROID, LEVOTHROID) 25 MCG tablet Take 25 mcg by mouth daily.    . magic mouthwash w/lidocaine SOLN Take 15 mLs by mouth 4 (four) times daily.  0  . omeprazole (PRILOSEC) 20 MG capsule Take 20 mg by mouth daily.    Marland Kitchen oxyCODONE-acetaminophen (PERCOCET) 5-325 MG tablet Take 1-2 tablets by mouth  every 4 (four) hours as needed for severe pain. 90 tablet 0  . phenol (CHLORASEPTIC) 1.4 % LIQD Use as directed 1 spray in the mouth or throat as needed for throat irritation / pain.  0  . simvastatin (ZOCOR) 20 MG tablet Take 1 tablet (20 mg total) by mouth daily. PLEASE CONTACT OFFICE FOR ADDITIONAL REFILLS 30 tablet 0  . spironolactone (ALDACTONE) 25 MG tablet Take 0.5 tablets (12.5 mg total) by mouth daily. PLEASE CONTACT OFFICE FOR ADDITIONAL REFILLS 15 tablet 0  . Vitamin D, Ergocalciferol, (DRISDOL) 50000 UNITS CAPS capsule Take 50,000 Units by mouth every 7 (seven) days. Monday     No current facility-administered medications for this visit.     Functional Status:  In your present state of health, do you have any difficulty performing the following activities: 07/04/2016 06/29/2016  Hearing? - N  Vision? - N  Difficulty concentrating or making decisions? - Y  Walking or climbing stairs? - Y  Dressing or bathing? - Y  Doing errands, shopping? - Y  Preparing Food and eating ? Y -  Using the Toilet? Y -  In the past six months, have you accidently leaked urine? Y -  Do you have problems with loss of bowel control? Y -  Managing your Medications? Y -  Managing your Finances? Y -  Housekeeping or managing your Housekeeping? Y -  Some recent data might be hidden  Fall/Depression Screening: PHQ 2/9 Scores 07/04/2016 06/29/2016 06/08/2016  PHQ - 2 Score 0 0 0   Fall Risk  07/04/2016 06/08/2016  Falls in the past year? Yes Yes  Number falls in past yr: 2 or more 2 or more  Injury with Fall? Yes Yes  Risk Factor Category  - High Fall Risk  Risk for fall due to : History of fall(s);Impaired balance/gait;Impaired mobility;Impaired vision;Medication side effect Impaired balance/gait;Impaired mobility  Follow up Falls prevention discussed Falls evaluation completed;Education provided;Falls prevention discussed   [facility] CM Care Plan Problem One   Flowsheet Row Most Recent Value  Care Plan  Problem One  patient recently discharged from skilled nursing facility  Role Documenting the Problem One  Care Management [place] for Problem One  Active  THN Long Term Goal (31-90 days)  In the next 31 days, patient will have no acute care admissions   [facility] Long Term Goal Start Date  06/20/16  Interventions for Problem One Long Term Goal  initial home visit for assessment of community care coordination   THN CM Short Term Goal #1 (0-30 days)  In the next 28 days, patient will meet with this [place] fo rchronic disease education  [facility] CM Short Term Goal #1 Start Date  06/20/16  [facility] CM Short Term Goal #1 Met Date  06/27/16  Interventions for Short Term Goal #1  Patient states she is not interested, she knows all she needs to know about COPD, she has all the medicines, has oxygen and understand the use of all  THN CM Short Term Goal #2 (0-30 days)  Patient will gain [place] services through [facility] after discharge from SNF  [facility] CM Short Term Goal #2 Start Date  06/29/16  Interventions for Short Term Goal #2  Goal not met but will be continued on. [facility] contacted family today during home visitl     Assessment:  Patient was reported by her husband to be asleep Patient's husband reports having refused rehabilitation from [facility] due to copayments. RNCM reviewed patient's case management care plan, updated.  Plan: . Telephonic contact later this month for further assessment of community care coordination needs

## 2016-07-07 DIAGNOSIS — R062 Wheezing: Secondary | ICD-10-CM | POA: Diagnosis not present

## 2016-07-07 DIAGNOSIS — E038 Other specified hypothyroidism: Secondary | ICD-10-CM | POA: Diagnosis not present

## 2016-07-07 DIAGNOSIS — Z8781 Personal history of (healed) traumatic fracture: Secondary | ICD-10-CM | POA: Diagnosis not present

## 2016-07-07 DIAGNOSIS — R0609 Other forms of dyspnea: Secondary | ICD-10-CM | POA: Diagnosis not present

## 2016-07-07 DIAGNOSIS — J449 Chronic obstructive pulmonary disease, unspecified: Secondary | ICD-10-CM | POA: Diagnosis not present

## 2016-07-07 DIAGNOSIS — J9601 Acute respiratory failure with hypoxia: Secondary | ICD-10-CM | POA: Diagnosis not present

## 2016-07-07 DIAGNOSIS — C3490 Malignant neoplasm of unspecified part of unspecified bronchus or lung: Secondary | ICD-10-CM | POA: Diagnosis not present

## 2016-07-07 DIAGNOSIS — Z1389 Encounter for screening for other disorder: Secondary | ICD-10-CM | POA: Diagnosis not present

## 2016-07-07 DIAGNOSIS — R2681 Unsteadiness on feet: Secondary | ICD-10-CM | POA: Diagnosis not present

## 2016-07-07 DIAGNOSIS — M545 Low back pain: Secondary | ICD-10-CM | POA: Diagnosis not present

## 2016-07-07 DIAGNOSIS — K5909 Other constipation: Secondary | ICD-10-CM | POA: Diagnosis not present

## 2016-07-12 ENCOUNTER — Other Ambulatory Visit: Payer: Self-pay

## 2016-07-12 NOTE — Patient Outreach (Signed)
[facility] [facility]) Care Management  07/12/2016   Janice Daniels 1946-03-17 [id number]  Subjective:  Husband answered telephone, stated his wife is doing better, able to move a little more.  Objective:  Telephonic encounter  Current Medications:  Current Outpatient Prescriptions  Medication Sig Dispense Refill  . ADVAIR DISKUS 250-50 MCG/DOSE AEPB Inhale 1 puff into the lungs 2 (two) times daily.  2  . albuterol (VENTOLIN HFA) 108 (90 BASE) MCG/ACT inhaler Inhale 2 puffs into the lungs every 6 (six) hours as needed for wheezing or shortness of breath.     Marland Kitchen alum & mag hydroxide-simeth (MAALOX/MYLANTA) 200-200-20 MG/5ML suspension Take 30 mLs by mouth every 4 (four) hours as needed for indigestion. 355 mL 0  . aspirin EC 81 MG EC tablet Take 1 tablet (81 mg total) by mouth daily.    Marland Kitchen enoxaparin (LOVENOX) 40 MG/0.4ML injection Inject 0.4 mLs (40 mg total) into the skin daily. 14 Syringe 0  . fluconazole (DIFLUCAN) 100 MG tablet Take 1 tablet (100 mg total) by mouth daily. 14 tablet 0  . gabapentin (NEURONTIN) 300 MG capsule Take 300 mg by mouth 2 (two) times daily.  3  . ipratropium (ATROVENT HFA) 17 MCG/ACT inhaler Inhale 2 puffs into the lungs every 6 (six) hours.    Marland Kitchen ipratropium-albuterol (DUONEB) 0.5-2.5 (3) MG/3ML SOLN Take 3 mLs by nebulization every 6 (six) hours. 360 mL 0  . levETIRAcetam (KEPPRA XR) 500 MG 24 hr tablet Take 1 tablet (500 mg total) by mouth daily. 30 tablet 0  . levothyroxine (SYNTHROID, LEVOTHROID) 25 MCG tablet Take 25 mcg by mouth daily.    . magic mouthwash w/lidocaine SOLN Take 15 mLs by mouth 4 (four) times daily.  0  . omeprazole (PRILOSEC) 20 MG capsule Take 20 mg by mouth daily.    Marland Kitchen oxyCODONE-acetaminophen (PERCOCET) 5-325 MG tablet Take 1-2 tablets by mouth every 4 (four) hours as needed for severe pain. 90 tablet 0  . phenol (CHLORASEPTIC) 1.4 % LIQD Use as directed 1 spray in the mouth or throat as needed for throat irritation /  pain.  0  . simvastatin (ZOCOR) 20 MG tablet Take 1 tablet (20 mg total) by mouth daily. PLEASE CONTACT OFFICE FOR ADDITIONAL REFILLS 30 tablet 0  . spironolactone (ALDACTONE) 25 MG tablet Take 0.5 tablets (12.5 mg total) by mouth daily. PLEASE CONTACT OFFICE FOR ADDITIONAL REFILLS 15 tablet 0  . Vitamin D, Ergocalciferol, (DRISDOL) 50000 UNITS CAPS capsule Take 50,000 Units by mouth every 7 (seven) days. Monday     No current facility-administered medications for this visit.     Functional Status:  In your present state of health, do you have any difficulty performing the following activities: 07/04/2016 06/29/2016  Hearing? - N  Vision? - N  Difficulty concentrating or making decisions? - Y  Walking or climbing stairs? - Y  Dressing or bathing? - Y  Doing errands, shopping? - Y  Preparing Food and eating ? Y -  Using the Toilet? Y -  In the past six months, have you accidently leaked urine? Y -  Do you have problems with loss of bowel control? Y -  Managing your Medications? Y -  Managing your Finances? Y -  Housekeeping or managing your Housekeeping? Y -  Some recent data might be hidden    Fall/Depression Screening: PHQ 2/9 Scores 07/04/2016 06/29/2016 06/08/2016  PHQ - 2 Score 0 0 0    THN CM Care Plan Problem One   Flowsheet  Row Most Recent Value  Care Plan Problem One  patient recently discharged from skilled nursing facility  Role Documenting the Problem One  Care Management [place] for Problem One  Active  THN Long Term Goal (31-90 days)  In the next 31 days, patient will have no acute care admissions   [facility] Long Term Goal Start Date  06/20/16  Interventions for Problem One Long Term Goal  telephone assessment to assess her needs for community care coordination  THN CM Short Term Goal #1 (0-30 days)  In the next 28 days, patient will meet with this [place] fo rchronic disease education  [facility] CM Short Term Goal #1 Start Date  06/20/16  [facility]  CM Short Term Goal #1 Met Date  06/27/16  Interventions for Short Term Goal #1  Patient states she is not interested, she knows all she needs to know about COPD, she has all the medicines, has oxygen and understand the use of all  THN CM Short Term Goal #2 (0-30 days)  Patient will gain [place] services through [facility] after discharge from SNF  [facility] CM Short Term Goal #2 Start Date  06/29/16  Interventions for Short Term Goal #2  [facility] is funding in home aide services for patient for 30 days starting this month.  husband refuses any services where there may be a copayment      Assessment:  Per husband's account, patient is progressing, is able to move better. Per husband, patient remains incontinent  of bower and bladder. Husband reports his wife continues to receive in home aide services. Husband continues to refuse home health services due to copayment, husband states he would like to continue to care for patient in home, refusing SNF placement  Plan:  Telephonic contact later this month to assess community care coordination needs

## 2016-07-13 ENCOUNTER — Other Ambulatory Visit: Payer: Self-pay | Admitting: Licensed Clinical Social Worker

## 2016-07-13 NOTE — Patient Outreach (Signed)
Janice Daniels) Care Management  07/13/2016  SHERYLL DYMEK September 16, 1945 [id number]   Assessment- CSW completed call to patient's residence and patient answered. She stated that she is feeling "so so." Patient's spouse got on the phone. He states that patient has "good days and bad days." He states that [facility] will be ending services this week. CSW questioned if he considered hiring them private pay to continue providing services and he decline. He stated that they cannot afford a caregiver. CSW provided personal care resource information but patient's spouse declined wanting to hire a private pay caregiver. He reports that at this time "I can handle it." He declines [place] services through [facility] during to their co payment. Patient is in need of additional support within the home. CSW spent time educating spouse on other senior resources within [facility] and reminded spouse that a Health and safety inspector was left in the [facility] folder with patient. CSW reminded spouse that patient was on the wait list for [facility]. This is the only program that family allowed CSW to make referral to. Family is not agreeable to LTC placement.   Plan-CSW will follow up within three weeks and will continue to provide social work support.  Eula Fried, BSW, MSW, [email or web address] Phone: [phone] Fax: [phone]

## 2016-07-17 DIAGNOSIS — J9620 Acute and chronic respiratory failure, unspecified whether with hypoxia or hypercapnia: Secondary | ICD-10-CM | POA: Diagnosis not present

## 2016-07-17 DIAGNOSIS — R112 Nausea with vomiting, unspecified: Secondary | ICD-10-CM | POA: Diagnosis not present

## 2016-07-17 DIAGNOSIS — J9611 Chronic respiratory failure with hypoxia: Secondary | ICD-10-CM | POA: Diagnosis not present

## 2016-07-17 DIAGNOSIS — J449 Chronic obstructive pulmonary disease, unspecified: Secondary | ICD-10-CM | POA: Diagnosis not present

## 2016-07-18 ENCOUNTER — Other Ambulatory Visit: Payer: Self-pay

## 2016-07-19 ENCOUNTER — Other Ambulatory Visit: Payer: Self-pay

## 2016-07-19 NOTE — Patient Outreach (Deleted)
[facility]) Care Management   07/19/2016  Janice Daniels 08/22/1945 [id number]  Janice Daniels is an 71 y.o. female  Subjective:   Objective:   ROS  Physical Exam  Encounter Medications:   Outpatient Encounter Prescriptions as of 07/19/2016  Medication Sig Note  . ADVAIR DISKUS 250-50 MCG/DOSE AEPB Inhale 1 puff into the lungs 2 (two) times daily. 08/09/2015: Received from: External Pharmacy Received Sig: INHALE 1 PUFF PO BID.  Marland Kitchen albuterol (VENTOLIN HFA) 108 (90 BASE) MCG/ACT inhaler Inhale 2 puffs into the lungs every 6 (six) hours as needed for wheezing or shortness of breath.    Marland Kitchen alum & mag hydroxide-simeth (MAALOX/MYLANTA) 200-200-20 MG/5ML suspension Take 30 mLs by mouth every 4 (four) hours as needed for indigestion.   Marland Kitchen aspirin EC 81 MG EC tablet Take 1 tablet (81 mg total) by mouth daily.   Marland Kitchen enoxaparin (LOVENOX) 40 MG/0.4ML injection Inject 0.4 mLs (40 mg total) into the skin daily.   . fluconazole (DIFLUCAN) 100 MG tablet Take 1 tablet (100 mg total) by mouth daily.   Marland Kitchen gabapentin (NEURONTIN) 300 MG capsule Take 300 mg by mouth 2 (two) times daily. 02/14/2016: Received from: External Pharmacy Received Sig: TK ONE C PO  BID TO REDUCE PAIN.  Marland Kitchen ipratropium (ATROVENT HFA) 17 MCG/ACT inhaler Inhale 2 puffs into the lungs every 6 (six) hours.   Marland Kitchen ipratropium-albuterol (DUONEB) 0.5-2.5 (3) MG/3ML SOLN Take 3 mLs by nebulization every 6 (six) hours.   . levETIRAcetam (KEPPRA XR) 500 MG 24 hr tablet Take 1 tablet (500 mg total) by mouth daily.   Marland Kitchen levothyroxine (SYNTHROID, LEVOTHROID) 25 MCG tablet Take 25 mcg by mouth daily. 02/14/2016: Received from: External Pharmacy  . magic mouthwash w/lidocaine SOLN Take 15 mLs by mouth 4 (four) times daily.   Marland Kitchen omeprazole (PRILOSEC) 20 MG capsule Take 20 mg by mouth daily. 02/14/2016: Received from: External Pharmacy  . oxyCODONE-acetaminophen (PERCOCET) 5-325 MG tablet Take 1-2 tablets by mouth every 4 (four) hours  as needed for severe pain.   . phenol (CHLORASEPTIC) 1.4 % LIQD Use as directed 1 spray in the mouth or throat as needed for throat irritation / pain.   . simvastatin (ZOCOR) 20 MG tablet Take 1 tablet (20 mg total) by mouth daily. PLEASE CONTACT OFFICE FOR ADDITIONAL REFILLS   . spironolactone (ALDACTONE) 25 MG tablet Take 0.5 tablets (12.5 mg total) by mouth daily. PLEASE CONTACT OFFICE FOR ADDITIONAL REFILLS   . Vitamin D, Ergocalciferol, (DRISDOL) 50000 UNITS CAPS capsule Take 50,000 Units by mouth every 7 (seven) days. Monday    No facility-administered encounter medications on file as of 07/19/2016.     Functional Status:   In your present state of health, do you have any difficulty performing the following activities: 07/04/2016 06/29/2016  Hearing? - N  Vision? - N  Difficulty concentrating or making decisions? - Y  Walking or climbing stairs? - Y  Dressing or bathing? - Y  Doing errands, shopping? - Y  Preparing Food and eating ? Y -  Using the Toilet? Y -  In the past six months, have you accidently leaked urine? Y -  Do you have problems with loss of bowel control? Y -  Managing your Medications? Y -  Managing your Finances? Y -  Housekeeping or managing your Housekeeping? Y -  Some recent data might be hidden    Fall/Depression Screening:    PHQ 2/9 Scores 07/04/2016 06/29/2016 06/08/2016  PHQ - 2 Score 0  0 0    Assessment:    Plan:

## 2016-07-19 NOTE — Patient Outreach (Signed)
[facility]) Care Management  07/19/2016   Janice Daniels 1945/07/29 [id number]  Subjective:  I am moving around more  Objective:  Elderly, frail lady lying in bed with coversup to her waist.  Current Medications:  Current Outpatient Prescriptions  Medication Sig Dispense Refill  . ADVAIR DISKUS 250-50 MCG/DOSE AEPB Inhale 1 puff into the lungs 2 (two) times daily.  2  . albuterol (VENTOLIN HFA) 108 (90 BASE) MCG/ACT inhaler Inhale 2 puffs into the lungs every 6 (six) hours as needed for wheezing or shortness of breath.     Marland Kitchen alum & mag hydroxide-simeth (MAALOX/MYLANTA) 200-200-20 MG/5ML suspension Take 30 mLs by mouth every 4 (four) hours as needed for indigestion. 355 mL 0  . aspirin EC 81 MG EC tablet Take 1 tablet (81 mg total) by mouth daily.    Marland Kitchen enoxaparin (LOVENOX) 40 MG/0.4ML injection Inject 0.4 mLs (40 mg total) into the skin daily. 14 Syringe 0  . fluconazole (DIFLUCAN) 100 MG tablet Take 1 tablet (100 mg total) by mouth daily. 14 tablet 0  . gabapentin (NEURONTIN) 300 MG capsule Take 300 mg by mouth 2 (two) times daily.  3  . ipratropium (ATROVENT HFA) 17 MCG/ACT inhaler Inhale 2 puffs into the lungs every 6 (six) hours.    Marland Kitchen ipratropium-albuterol (DUONEB) 0.5-2.5 (3) MG/3ML SOLN Take 3 mLs by nebulization every 6 (six) hours. 360 mL 0  . levETIRAcetam (KEPPRA XR) 500 MG 24 hr tablet Take 1 tablet (500 mg total) by mouth daily. 30 tablet 0  . levothyroxine (SYNTHROID, LEVOTHROID) 25 MCG tablet Take 25 mcg by mouth daily.    . magic mouthwash w/lidocaine SOLN Take 15 mLs by mouth 4 (four) times daily.  0  . omeprazole (PRILOSEC) 20 MG capsule Take 20 mg by mouth daily.    Marland Kitchen oxyCODONE-acetaminophen (PERCOCET) 5-325 MG tablet Take 1-2 tablets by mouth every 4 (four) hours as needed for severe pain. 90 tablet 0  . phenol (CHLORASEPTIC) 1.4 % LIQD Use as directed 1 spray in the mouth or throat as needed for throat irritation / pain.  0  . simvastatin  (ZOCOR) 20 MG tablet Take 1 tablet (20 mg total) by mouth daily. PLEASE CONTACT OFFICE FOR ADDITIONAL REFILLS 30 tablet 0  . spironolactone (ALDACTONE) 25 MG tablet Take 0.5 tablets (12.5 mg total) by mouth daily. PLEASE CONTACT OFFICE FOR ADDITIONAL REFILLS 15 tablet 0  . Vitamin D, Ergocalciferol, (DRISDOL) 50000 UNITS CAPS capsule Take 50,000 Units by mouth every 7 (seven) days. Monday     No current facility-administered medications for this visit.     Functional Status:  In your present state of health, do you have any difficulty performing the following activities: 07/04/2016 06/29/2016  Hearing? - N  Vision? - N  Difficulty concentrating or making decisions? - Y  Walking or climbing stairs? - Y  Dressing or bathing? - Y  Doing errands, shopping? - Y  Preparing Food and eating ? Y -  Using the Toilet? Y -  In the past six months, have you accidently leaked urine? Y -  Do you have problems with loss of bowel control? Y -  Managing your Medications? Y -  Managing your Finances? Y -  Housekeeping or managing your Housekeeping? Y -  Some recent data might be hidden    Fall/Depression Screening: PHQ 2/9 Scores 07/04/2016 06/29/2016 06/08/2016  PHQ - 2 Score 0 0 0    Assessment:  Patient reports continuing to increase activities and is  able to do a little more for herself. Patient informed by this RNCM her services with the in home aide service is coming to an end of period that was funded by [facility]. Patient advised she would need to contract with agency if further assistance is needed. Patient stated she would talk with her husband about extending the services.    Plan:  Patient and RNCM agreed to home visit in 2 weeks for further assessment of community care coordination

## 2016-07-24 ENCOUNTER — Other Ambulatory Visit: Payer: Self-pay | Admitting: Licensed Clinical Social Worker

## 2016-07-24 NOTE — Patient Outreach (Signed)
[facility] [facility]) Care Management  07/24/2016  [place] Oct 26, 1945 [id number]   Assessment-CSW completed outreach attempt today. CSW unable to reach patient successfully. CSW left a HIPPA compliant voice message encouraging patient to return call once available.  Plan-CSW will await return call or complete an additional outreach if needed within one week.  Eula Fried, BSW, MSW, [email or web address] Phone: [phone] Fax: [phone]

## 2016-07-28 ENCOUNTER — Other Ambulatory Visit: Payer: Self-pay | Admitting: Licensed Clinical Social Worker

## 2016-07-28 NOTE — Patient Outreach (Signed)
[facility]) Care Management  07/28/2016  Janice Daniels [birth year] [id number]  Assessment-CSW completed outreach attempt today. CSW unable to reach patient successfully. CSW left a HIPPA compliant voice message encouraging patient to return call once available.  Plan-CSW will await return call or complete an additional outreach if needed within two weeks.  Eula Fried, BSW, MSW, [email or web address] Phone: [phone] Fax: [phone]

## 2016-08-01 ENCOUNTER — Other Ambulatory Visit: Payer: Self-pay

## 2016-08-02 NOTE — Patient Outreach (Signed)
[facility]) Care Management   08/02/2015  Janice Daniels 1946-01-05 [id number]  Janice Daniels is an 71 y.o. female  Subjective:  I want to have the therapy but he (her husband) does not want to pay the copayment.   Objective:   ROS  Frail, elderly lady with deformed feet muscles.   Physical Exam ROS  Encounter Medications:   Outpatient Encounter Prescriptions as of 08/01/2016  Medication Sig Note  . ADVAIR DISKUS 250-50 MCG/DOSE AEPB Inhale 1 puff into the lungs 2 (two) times daily. 08/09/2015: Received from: External Pharmacy Received Sig: INHALE 1 PUFF PO BID.  Marland Kitchen albuterol (VENTOLIN HFA) 108 (90 BASE) MCG/ACT inhaler Inhale 2 puffs into the lungs every 6 (six) hours as needed for wheezing or shortness of breath.    Marland Kitchen alum & mag hydroxide-simeth (MAALOX/MYLANTA) 200-200-20 MG/5ML suspension Take 30 mLs by mouth every 4 (four) hours as needed for indigestion.   Marland Kitchen aspirin EC 81 MG EC tablet Take 1 tablet (81 mg total) by mouth daily.   Marland Kitchen enoxaparin (LOVENOX) 40 MG/0.4ML injection Inject 0.4 mLs (40 mg total) into the skin daily.   . fluconazole (DIFLUCAN) 100 MG tablet Take 1 tablet (100 mg total) by mouth daily.   Marland Kitchen gabapentin (NEURONTIN) 300 MG capsule Take 300 mg by mouth 2 (two) times daily. 02/14/2016: Received from: External Pharmacy Received Sig: TK ONE C PO  BID TO REDUCE PAIN.  Marland Kitchen ipratropium (ATROVENT HFA) 17 MCG/ACT inhaler Inhale 2 puffs into the lungs every 6 (six) hours.   Marland Kitchen ipratropium-albuterol (DUONEB) 0.5-2.5 (3) MG/3ML SOLN Take 3 mLs by nebulization every 6 (six) hours.   . levETIRAcetam (KEPPRA XR) 500 MG 24 hr tablet Take 1 tablet (500 mg total) by mouth daily.   Marland Kitchen levothyroxine (SYNTHROID, LEVOTHROID) 25 MCG tablet Take 25 mcg by mouth daily. 02/14/2016: Received from: External Pharmacy  . magic mouthwash w/lidocaine SOLN Take 15 mLs by mouth 4 (four) times daily.   Marland Kitchen omeprazole (PRILOSEC) 20 MG capsule Take 20 mg by mouth daily.  02/14/2016: Received from: External Pharmacy  . oxyCODONE-acetaminophen (PERCOCET) 5-325 MG tablet Take 1-2 tablets by mouth every 4 (four) hours as needed for severe pain.   . phenol (CHLORASEPTIC) 1.4 % LIQD Use as directed 1 spray in the mouth or throat as needed for throat irritation / pain.   . simvastatin (ZOCOR) 20 MG tablet Take 1 tablet (20 mg total) by mouth daily. PLEASE CONTACT OFFICE FOR ADDITIONAL REFILLS   . spironolactone (ALDACTONE) 25 MG tablet Take 0.5 tablets (12.5 mg total) by mouth daily. PLEASE CONTACT OFFICE FOR ADDITIONAL REFILLS   . Vitamin D, Ergocalciferol, (DRISDOL) 50000 UNITS CAPS capsule Take 50,000 Units by mouth every 7 (seven) days. Monday    No facility-administered encounter medications on file as of 08/01/2016.     Functional Status:   In your present state of health, do you have any difficulty performing the following activities: 07/04/2016 06/29/2016  Hearing? - N  Vision? - N  Difficulty concentrating or making decisions? - Y  Walking or climbing stairs? - Y  Dressing or bathing? - Y  Doing errands, shopping? - Y  Preparing Food and eating ? Y -  Using the Toilet? Y -  In the past six months, have you accidently leaked urine? Y -  Do you have problems with loss of bowel control? Y -  Managing your Medications? Y -  Managing your Finances? Y -  Housekeeping or managing your Housekeeping? Y -  Some recent data might be hidden    Fall/Depression Screening:    PHQ 2/9 Scores 07/04/2016 06/29/2016 06/08/2016  PHQ - 2 Score 0 0 0   THN CM Care Plan Problem One   Flowsheet Row Most Recent Value  Care Plan Problem One  patient recently discharged from skilled nursing facility  Role Documenting the Problem One  Care Management [place] for Problem One  Active  THN Long Term Goal (31-90 days)  In the next 31 days, patient will have no acute care admissions   [facility] Long Term Goal Start Date  06/20/16  [facility] Long Term Goal Met Date  08/01/16   Interventions for Problem One Long Term Goal  home visit for assessment of community care coordination needs  THN CM Short Term Goal #1 (0-30 days)  In the next 28 days, patient will meet with this [facility] Coordinator fo rchronic disease education  [facility] CM Short Term Goal #1 Start Date  06/20/16  [facility] CM Short Term Goal #1 Met Date  06/27/16  Interventions for Short Term Goal #1  Patient states she is not interested, she knows all she needs to know about COPD, she has all the medicines, has oxygen and understand the use of all  THN CM Short Term Goal #2 (0-30 days)  Patient will gain [place] services through [facility] after discharge from SNF  [facility] CM Short Term Goal #2 Start Date  06/29/16  [facility] CM Short Term Goal #2 Met Date  08/01/16  Interventions for Short Term Goal #2  thn funding for in home aide services is nearing the end.  patient asked for an extension.  RNCM advised member she would need to provide funding for furhter services as her income allows for her to cover expenses     Assessment:   RNCM arrived to find patient lying in bed on her back. RNCM requested information and her husband on status of the previously prescribed PT/OT. Husband, who often,  Responded they could not afford the $25.00 copayment. RNCM encouraged patient and husband to talk to home health agency about a payment plan so payment of copayment could be easier on their budget. Husband states he does not want service, states he thinks [facility] should pay. This RNCM emphasized the importance of the therapies to preent further muscle atrophy.   RNCM assisted patient in leg lift and bending exercises she can do from her bed, husband was present during the demonstration.   RNCM encouraged patient to do 10 repetitions per hour, with feet flexions  Patient's husband refuses. RNCM advised patient and husband on the intent to discharge due to patient meeting her case management goals. Patient and husband was given the  information to home health agency, [facility].  [facility] had provided authorizations which last until September 29, 2016.   Plan:   Discharge from caseload as patient has met her case management goals

## 2016-08-04 ENCOUNTER — Other Ambulatory Visit: Payer: Self-pay | Admitting: Licensed Clinical Social Worker

## 2016-08-04 NOTE — Patient Outreach (Signed)
[place] [facility]) Care Management  08/04/2016  [place] 02/07/1946 [id number]   Assessment-CSW completed outreach attempt today. CSW unable to reach patient successfully. CSW left a HIPPA compliant voice message encouraging patient to return call once available.  Plan-CSW will await return call or complete an additional outreach if needed within two weeks. If unable to reach family, CSW will mail outreach barrier letter.  Eula Fried, BSW, MSW, [email or web address] Phone: [phone] Fax: [phone]

## 2016-08-09 ENCOUNTER — Other Ambulatory Visit: Payer: Self-pay | Admitting: Licensed Clinical Social Worker

## 2016-08-09 NOTE — Patient Outreach (Signed)
[place] [facility]) Care Management  08/09/2016  [place] Feb 21, 1946 [id number]   Assessment-CSW completed outreach attempt today. CSW unable to reach patient successfully. CSW left a HIPPA compliant voice message encouraging patient to return call once available.  Plan-CSW will send outreach barrier letter out at this time and await 10 business days to hear back before closing.  Eula Fried, BSW, MSW, [email or web address] Phone: [phone] Fax: [phone]

## 2016-08-14 ENCOUNTER — Other Ambulatory Visit: Payer: PPO

## 2016-08-17 ENCOUNTER — Ambulatory Visit: Payer: PPO | Admitting: Internal Medicine

## 2016-08-17 ENCOUNTER — Ambulatory Visit (INDEPENDENT_AMBULATORY_CARE_PROVIDER_SITE_OTHER): Payer: PPO | Admitting: Orthopaedic Surgery

## 2016-08-17 DIAGNOSIS — J449 Chronic obstructive pulmonary disease, unspecified: Secondary | ICD-10-CM | POA: Diagnosis not present

## 2016-08-17 DIAGNOSIS — J9611 Chronic respiratory failure with hypoxia: Secondary | ICD-10-CM | POA: Diagnosis not present

## 2016-08-17 DIAGNOSIS — R112 Nausea with vomiting, unspecified: Secondary | ICD-10-CM | POA: Diagnosis not present

## 2016-08-17 DIAGNOSIS — J9620 Acute and chronic respiratory failure, unspecified whether with hypoxia or hypercapnia: Secondary | ICD-10-CM | POA: Diagnosis not present

## 2016-08-23 ENCOUNTER — Encounter: Payer: Self-pay | Admitting: Licensed Clinical Social Worker

## 2016-08-23 ENCOUNTER — Other Ambulatory Visit: Payer: Self-pay | Admitting: Licensed Clinical Social Worker

## 2016-08-23 NOTE — Patient Outreach (Signed)
[place] [facility]) Care Management  08/23/2016  Janice Daniels March 03, 1946 [id number]   Assessment- CSW will complete case closure at this time due to inability to maintain contact with patient and family.  Plan-CSW will update [facility] PCP and Care Management Assistant of case closure.  Eula Fried, BSW, MSW, [email or web address] Phone: [phone] Fax: [phone]

## 2016-09-14 DIAGNOSIS — J9611 Chronic respiratory failure with hypoxia: Secondary | ICD-10-CM | POA: Diagnosis not present

## 2016-09-14 DIAGNOSIS — R112 Nausea with vomiting, unspecified: Secondary | ICD-10-CM | POA: Diagnosis not present

## 2016-09-14 DIAGNOSIS — J449 Chronic obstructive pulmonary disease, unspecified: Secondary | ICD-10-CM | POA: Diagnosis not present

## 2016-09-14 DIAGNOSIS — J9620 Acute and chronic respiratory failure, unspecified whether with hypoxia or hypercapnia: Secondary | ICD-10-CM | POA: Diagnosis not present

## 2016-10-15 DIAGNOSIS — J9620 Acute and chronic respiratory failure, unspecified whether with hypoxia or hypercapnia: Secondary | ICD-10-CM | POA: Diagnosis not present

## 2016-10-15 DIAGNOSIS — J9611 Chronic respiratory failure with hypoxia: Secondary | ICD-10-CM | POA: Diagnosis not present

## 2016-10-15 DIAGNOSIS — J449 Chronic obstructive pulmonary disease, unspecified: Secondary | ICD-10-CM | POA: Diagnosis not present

## 2016-10-15 DIAGNOSIS — R112 Nausea with vomiting, unspecified: Secondary | ICD-10-CM | POA: Diagnosis not present

## 2016-10-22 ENCOUNTER — Encounter (HOSPITAL_COMMUNITY): Payer: Self-pay | Admitting: Family Medicine

## 2016-10-22 ENCOUNTER — Inpatient Hospital Stay (HOSPITAL_COMMUNITY)
Admission: EM | Admit: 2016-10-22 | Discharge: 2016-10-27 | DRG: 190 | Disposition: A | Discharge status: Hospice | Payer: PPO | Attending: Internal Medicine | Admitting: Internal Medicine

## 2016-10-22 ENCOUNTER — Emergency Department (HOSPITAL_COMMUNITY): Payer: PPO

## 2016-10-22 DIAGNOSIS — J441 Chronic obstructive pulmonary disease with (acute) exacerbation: Principal | ICD-10-CM | POA: Diagnosis present

## 2016-10-22 DIAGNOSIS — F329 Major depressive disorder, single episode, unspecified: Secondary | ICD-10-CM | POA: Diagnosis present

## 2016-10-22 DIAGNOSIS — E43 Unspecified severe protein-calorie malnutrition: Secondary | ICD-10-CM | POA: Diagnosis not present

## 2016-10-22 DIAGNOSIS — I110 Hypertensive heart disease with heart failure: Secondary | ICD-10-CM | POA: Diagnosis not present

## 2016-10-22 DIAGNOSIS — R069 Unspecified abnormalities of breathing: Secondary | ICD-10-CM | POA: Diagnosis not present

## 2016-10-22 DIAGNOSIS — Z681 Body mass index (BMI) 19 or less, adult: Secondary | ICD-10-CM | POA: Diagnosis not present

## 2016-10-22 DIAGNOSIS — G40909 Epilepsy, unspecified, not intractable, without status epilepticus: Secondary | ICD-10-CM | POA: Diagnosis present

## 2016-10-22 DIAGNOSIS — E871 Hypo-osmolality and hyponatremia: Secondary | ICD-10-CM | POA: Diagnosis not present

## 2016-10-22 DIAGNOSIS — Z7189 Other specified counseling: Secondary | ICD-10-CM | POA: Diagnosis not present

## 2016-10-22 DIAGNOSIS — R569 Unspecified convulsions: Secondary | ICD-10-CM | POA: Diagnosis not present

## 2016-10-22 DIAGNOSIS — J440 Chronic obstructive pulmonary disease with acute lower respiratory infection: Secondary | ICD-10-CM | POA: Diagnosis not present

## 2016-10-22 DIAGNOSIS — Z8673 Personal history of transient ischemic attack (TIA), and cerebral infarction without residual deficits: Secondary | ICD-10-CM

## 2016-10-22 DIAGNOSIS — K219 Gastro-esophageal reflux disease without esophagitis: Secondary | ICD-10-CM | POA: Diagnosis present

## 2016-10-22 DIAGNOSIS — R5381 Other malaise: Secondary | ICD-10-CM | POA: Diagnosis not present

## 2016-10-22 DIAGNOSIS — F17210 Nicotine dependence, cigarettes, uncomplicated: Secondary | ICD-10-CM | POA: Diagnosis present

## 2016-10-22 DIAGNOSIS — Z66 Do not resuscitate: Secondary | ICD-10-CM | POA: Diagnosis present

## 2016-10-22 DIAGNOSIS — M479 Spondylosis, unspecified: Secondary | ICD-10-CM | POA: Diagnosis present

## 2016-10-22 DIAGNOSIS — Z9981 Dependence on supplemental oxygen: Secondary | ICD-10-CM | POA: Diagnosis not present

## 2016-10-22 DIAGNOSIS — R339 Retention of urine, unspecified: Secondary | ICD-10-CM | POA: Diagnosis not present

## 2016-10-22 DIAGNOSIS — J40 Bronchitis, not specified as acute or chronic: Secondary | ICD-10-CM | POA: Diagnosis not present

## 2016-10-22 DIAGNOSIS — R0603 Acute respiratory distress: Secondary | ICD-10-CM

## 2016-10-22 DIAGNOSIS — Z7901 Long term (current) use of anticoagulants: Secondary | ICD-10-CM

## 2016-10-22 DIAGNOSIS — M545 Low back pain: Secondary | ICD-10-CM | POA: Diagnosis not present

## 2016-10-22 DIAGNOSIS — I10 Essential (primary) hypertension: Secondary | ICD-10-CM | POA: Diagnosis not present

## 2016-10-22 DIAGNOSIS — T7601XA Adult neglect or abandonment, suspected, initial encounter: Secondary | ICD-10-CM | POA: Diagnosis not present

## 2016-10-22 DIAGNOSIS — R627 Adult failure to thrive: Secondary | ICD-10-CM | POA: Diagnosis present

## 2016-10-22 DIAGNOSIS — J9621 Acute and chronic respiratory failure with hypoxia: Secondary | ICD-10-CM | POA: Diagnosis not present

## 2016-10-22 DIAGNOSIS — I5023 Acute on chronic systolic (congestive) heart failure: Secondary | ICD-10-CM | POA: Diagnosis not present

## 2016-10-22 DIAGNOSIS — Z9221 Personal history of antineoplastic chemotherapy: Secondary | ICD-10-CM

## 2016-10-22 DIAGNOSIS — E861 Hypovolemia: Secondary | ICD-10-CM | POA: Diagnosis not present

## 2016-10-22 DIAGNOSIS — Z885 Allergy status to narcotic agent status: Secondary | ICD-10-CM

## 2016-10-22 DIAGNOSIS — Z720 Tobacco use: Secondary | ICD-10-CM | POA: Diagnosis not present

## 2016-10-22 DIAGNOSIS — C3401 Malignant neoplasm of right main bronchus: Secondary | ICD-10-CM

## 2016-10-22 DIAGNOSIS — C3490 Malignant neoplasm of unspecified part of unspecified bronchus or lung: Secondary | ICD-10-CM | POA: Diagnosis present

## 2016-10-22 DIAGNOSIS — Z79899 Other long term (current) drug therapy: Secondary | ICD-10-CM

## 2016-10-22 DIAGNOSIS — E860 Dehydration: Secondary | ICD-10-CM | POA: Diagnosis not present

## 2016-10-22 DIAGNOSIS — Z515 Encounter for palliative care: Secondary | ICD-10-CM

## 2016-10-22 DIAGNOSIS — E038 Other specified hypothyroidism: Secondary | ICD-10-CM | POA: Diagnosis not present

## 2016-10-22 DIAGNOSIS — Z923 Personal history of irradiation: Secondary | ICD-10-CM

## 2016-10-22 DIAGNOSIS — Z7982 Long term (current) use of aspirin: Secondary | ICD-10-CM

## 2016-10-22 DIAGNOSIS — Z7401 Bed confinement status: Secondary | ICD-10-CM

## 2016-10-22 DIAGNOSIS — Z7951 Long term (current) use of inhaled steroids: Secondary | ICD-10-CM

## 2016-10-22 DIAGNOSIS — M8580 Other specified disorders of bone density and structure, unspecified site: Secondary | ICD-10-CM | POA: Diagnosis present

## 2016-10-22 DIAGNOSIS — M19049 Primary osteoarthritis, unspecified hand: Secondary | ICD-10-CM | POA: Diagnosis present

## 2016-10-22 DIAGNOSIS — Z85118 Personal history of other malignant neoplasm of bronchus and lung: Secondary | ICD-10-CM

## 2016-10-22 DIAGNOSIS — I5043 Acute on chronic combined systolic (congestive) and diastolic (congestive) heart failure: Secondary | ICD-10-CM | POA: Diagnosis present

## 2016-10-22 DIAGNOSIS — R0602 Shortness of breath: Secondary | ICD-10-CM | POA: Diagnosis not present

## 2016-10-22 LAB — CBC WITH DIFFERENTIAL/PLATELET
BASOS PCT: 0 %
Basophils Absolute: 0 10*3/uL (ref 0.0–0.1)
EOS ABS: 0.1 10*3/uL (ref 0.0–0.7)
Eosinophils Relative: 1 %
HCT: 37 % (ref 36.0–46.0)
Hemoglobin: 12.4 g/dL (ref 12.0–15.0)
Lymphocytes Relative: 10 %
Lymphs Abs: 0.7 10*3/uL (ref 0.7–4.0)
MCH: 30 pg (ref 26.0–34.0)
MCHC: 33.5 g/dL (ref 30.0–36.0)
MCV: 89.4 fL (ref 78.0–100.0)
MONO ABS: 0.8 10*3/uL (ref 0.1–1.0)
MONOS PCT: 11 %
Neutro Abs: 5.5 10*3/uL (ref 1.7–7.7)
Neutrophils Relative %: 78 %
Platelets: 235 10*3/uL (ref 150–400)
RBC: 4.14 MIL/uL (ref 3.87–5.11)
RDW: 14.6 % (ref 11.5–15.5)
WBC: 7 10*3/uL (ref 4.0–10.5)

## 2016-10-22 LAB — CREATININE, URINE, RANDOM: Creatinine, Urine: 52.29 mg/dL

## 2016-10-22 LAB — URINALYSIS, ROUTINE W REFLEX MICROSCOPIC
Bilirubin Urine: NEGATIVE
GLUCOSE, UA: NEGATIVE mg/dL
KETONES UR: NEGATIVE mg/dL
LEUKOCYTES UA: NEGATIVE
NITRITE: NEGATIVE
PROTEIN: NEGATIVE mg/dL
Specific Gravity, Urine: 1.006 (ref 1.005–1.030)
WBC, UA: NONE SEEN WBC/hpf (ref 0–5)
pH: 6 (ref 5.0–8.0)

## 2016-10-22 LAB — COMPREHENSIVE METABOLIC PANEL
ALBUMIN: 3.4 g/dL — AB (ref 3.5–5.0)
ALT: 5 U/L — AB (ref 14–54)
AST: 20 U/L (ref 15–41)
Alkaline Phosphatase: 67 U/L (ref 38–126)
Anion gap: 11 (ref 5–15)
BUN: <5 mg/dL — ABNORMAL LOW (ref 6–20)
CO2: 25 mmol/L (ref 22–32)
CREATININE: 0.77 mg/dL (ref 0.44–1.00)
Calcium: 8.7 mg/dL — ABNORMAL LOW (ref 8.9–10.3)
Chloride: 88 mmol/L — ABNORMAL LOW (ref 101–111)
GFR calc non Af Amer: >60 mL/min (ref >60)
Glucose, Bld: 108 mg/dL — ABNORMAL HIGH (ref 65–99)
POTASSIUM: 3.9 mmol/L (ref 3.5–5.1)
SODIUM: 124 mmol/L — AB (ref 135–145)
Total Bilirubin: 0.8 mg/dL (ref 0.3–1.2)
Total Protein: 5.9 g/dL — ABNORMAL LOW (ref 6.5–8.1)

## 2016-10-22 LAB — OSMOLALITY, URINE: Osmolality, Ur: 201 mOsm/kg — ABNORMAL LOW (ref 300–900)

## 2016-10-22 LAB — I-STAT VENOUS BLOOD GAS, ED
ACID-BASE EXCESS: 2 mmol/L (ref 0.0–2.0)
BICARBONATE: 25.4 mmol/L (ref 20.0–28.0)
BICARBONATE: 24.8 mmol/L (ref 20.0–28.0)
Bicarbonate: 29.9 mmol/L — ABNORMAL HIGH (ref 20.0–28.0)
O2 SAT: 40 %
O2 Saturation: 91 %
O2 Saturation: 82 %
PCO2 VEN: 60 mmHg (ref 44.0–60.0)
PH VEN: 7.388 (ref 7.250–7.430)
PH VEN: 7.305 (ref 7.250–7.430)
PO2 VEN: 26 mmHg — AB (ref 32.0–45.0)
PO2 VEN: 45 mmHg (ref 32.0–45.0)
Patient temperature: 98.6
TCO2: 27 mmol/L (ref 0–100)
TCO2: 32 mmol/L (ref 0–100)
TCO2: 26 mmol/L (ref 0–100)
pCO2, Ven: 42.1 mmHg — ABNORMAL LOW (ref 44.0–60.0)
pCO2, Ven: 38.2 mmHg — ABNORMAL LOW (ref 44.0–60.0)
pH, Ven: 7.42 (ref 7.250–7.430)
pO2, Ven: 63 mmHg — ABNORMAL HIGH (ref 32.0–45.0)

## 2016-10-22 LAB — I-STAT TROPONIN, ED: TROPONIN I, POC: 0.01 ng/mL (ref 0.00–0.08)

## 2016-10-22 LAB — SODIUM, URINE, RANDOM: SODIUM UR: 21 mmol/L

## 2016-10-22 LAB — BRAIN NATRIURETIC PEPTIDE: B NATRIURETIC PEPTIDE 5: 732.1 pg/mL — AB (ref 0.0–100.0)

## 2016-10-22 MED ORDER — ALBUTEROL SULFATE (2.5 MG/3ML) 0.083% IN NEBU: 2.5000 mg | INHALATION_SOLUTION | Freq: Four times a day (QID) | RESPIRATORY_TRACT | Status: DC

## 2016-10-22 MED ORDER — SODIUM CHLORIDE 0.9% FLUSH
3.0000 mL | Freq: Two times a day (BID) | INTRAVENOUS | Status: DC
  Administered 2016-10-23 – 2016-10-25 (×5): 3 mL via INTRAVENOUS
  Administered 2016-10-25: 21:00:00 via INTRAVENOUS
  Administered 2016-10-26 – 2016-10-27 (×3): 3 mL via INTRAVENOUS

## 2016-10-22 MED ORDER — LEVETIRACETAM ER 500 MG PO TB24
500.0000 mg | ORAL_TABLET | Freq: Every day | ORAL | Status: DC
  Administered 2016-10-23 – 2016-10-26 (×4): 500 mg via ORAL
  Filled 2016-10-22 (×6): qty 1

## 2016-10-22 MED ORDER — PANTOPRAZOLE SODIUM 40 MG PO TBEC
40.0000 mg | DELAYED_RELEASE_TABLET | Freq: Every day | ORAL | Status: DC
  Administered 2016-10-23 – 2016-10-27 (×5): 40 mg via ORAL
  Filled 2016-10-22 (×5): qty 1

## 2016-10-22 MED ORDER — IPRATROPIUM BROMIDE 0.02 % IN SOLN
1.0000 mg | Freq: Once | RESPIRATORY_TRACT | Status: AC
  Administered 2016-10-22: 1 mg via RESPIRATORY_TRACT
  Filled 2016-10-22: qty 5

## 2016-10-22 MED ORDER — IPRATROPIUM-ALBUTEROL 0.5-2.5 (3) MG/3ML IN SOLN
3.0000 mL | Freq: Four times a day (QID) | RESPIRATORY_TRACT | Status: DC
  Administered 2016-10-22 – 2016-10-23 (×4): 3 mL via RESPIRATORY_TRACT
  Filled 2016-10-22 (×3): qty 3

## 2016-10-22 MED ORDER — FLUOXETINE HCL 10 MG PO CAPS
10.0000 mg | ORAL_CAPSULE | Freq: Every day | ORAL | Status: DC
  Administered 2016-10-23 – 2016-10-27 (×5): 10 mg via ORAL
  Filled 2016-10-22 (×6): qty 1

## 2016-10-22 MED ORDER — ALBUTEROL SULFATE (2.5 MG/3ML) 0.083% IN NEBU
2.5000 mg | INHALATION_SOLUTION | RESPIRATORY_TRACT | Status: DC | PRN
  Administered 2016-10-24: 2.5 mg via RESPIRATORY_TRACT
  Filled 2016-10-22: qty 3

## 2016-10-22 MED ORDER — POTASSIUM CHLORIDE CRYS ER 20 MEQ PO TBCR
40.0000 meq | EXTENDED_RELEASE_TABLET | Freq: Every day | ORAL | Status: DC
  Administered 2016-10-23 – 2016-10-27 (×5): 40 meq via ORAL
  Filled 2016-10-22 (×5): qty 2

## 2016-10-22 MED ORDER — RIVAROXABAN 10 MG PO TABS
10.0000 mg | ORAL_TABLET | Freq: Every day | ORAL | Status: DC
  Administered 2016-10-23 – 2016-10-26 (×4): 10 mg via ORAL
  Filled 2016-10-22 (×4): qty 1

## 2016-10-22 MED ORDER — FUROSEMIDE 10 MG/ML IJ SOLN
40.0000 mg | Freq: Every day | INTRAMUSCULAR | Status: DC
  Administered 2016-10-23: 40 mg via INTRAVENOUS
  Filled 2016-10-22: qty 4

## 2016-10-22 MED ORDER — SPIRONOLACTONE 25 MG PO TABS
12.5000 mg | ORAL_TABLET | Freq: Every day | ORAL | Status: DC
  Administered 2016-10-23 – 2016-10-24 (×2): 12.5 mg via ORAL
  Filled 2016-10-22 (×2): qty 1

## 2016-10-22 MED ORDER — METHYLPREDNISOLONE SODIUM SUCC 125 MG IJ SOLR
60.0000 mg | Freq: Every day | INTRAMUSCULAR | Status: DC
  Administered 2016-10-23 – 2016-10-24 (×2): 60 mg via INTRAVENOUS
  Filled 2016-10-22 (×2): qty 2

## 2016-10-22 MED ORDER — IPRATROPIUM BROMIDE 0.02 % IN SOLN: 0.5000 mg | Freq: Four times a day (QID) | RESPIRATORY_TRACT | Status: DC

## 2016-10-22 MED ORDER — IPRATROPIUM-ALBUTEROL 0.5-2.5 (3) MG/3ML IN SOLN
RESPIRATORY_TRACT | Status: AC
  Administered 2016-10-22: 3 mL via RESPIRATORY_TRACT
  Filled 2016-10-22: qty 3

## 2016-10-22 MED ORDER — LEVOTHYROXINE SODIUM 25 MCG PO TABS
25.0000 ug | ORAL_TABLET | Freq: Every day | ORAL | Status: DC
  Administered 2016-10-23: 25 ug via ORAL
  Filled 2016-10-22: qty 1

## 2016-10-22 MED ORDER — ONDANSETRON HCL 4 MG PO TABS: 4.0000 mg | ORAL_TABLET | Freq: Four times a day (QID) | ORAL | Status: DC | PRN

## 2016-10-22 MED ORDER — ACETAMINOPHEN 325 MG PO TABS
650.0000 mg | ORAL_TABLET | Freq: Four times a day (QID) | ORAL | Status: DC | PRN
  Administered 2016-10-25: 650 mg via ORAL
  Filled 2016-10-22: qty 2

## 2016-10-22 MED ORDER — MOMETASONE FURO-FORMOTEROL FUM 200-5 MCG/ACT IN AERO
2.0000 | INHALATION_SPRAY | Freq: Two times a day (BID) | RESPIRATORY_TRACT | Status: DC
  Administered 2016-10-23 – 2016-10-24 (×3): 2 via RESPIRATORY_TRACT
  Filled 2016-10-22: qty 8.8

## 2016-10-22 MED ORDER — GABAPENTIN 300 MG PO CAPS
300.0000 mg | ORAL_CAPSULE | Freq: Every day | ORAL | Status: DC
  Administered 2016-10-23 – 2016-10-26 (×4): 300 mg via ORAL
  Filled 2016-10-22 (×4): qty 1

## 2016-10-22 MED ORDER — ENSURE ENLIVE PO LIQD
237.0000 mL | Freq: Two times a day (BID) | ORAL | Status: DC
  Administered 2016-10-23: 237 mL via ORAL

## 2016-10-22 MED ORDER — ASPIRIN EC 81 MG PO TBEC
81.0000 mg | DELAYED_RELEASE_TABLET | Freq: Every day | ORAL | Status: DC
  Administered 2016-10-23 – 2016-10-27 (×5): 81 mg via ORAL
  Filled 2016-10-22 (×5): qty 1

## 2016-10-22 MED ORDER — ACETAMINOPHEN 650 MG RE SUPP: 650.0000 mg | Freq: Four times a day (QID) | RECTAL | Status: DC | PRN

## 2016-10-22 MED ORDER — MAGNESIUM SULFATE 2 GM/50ML IV SOLN
2.0000 g | Freq: Once | INTRAVENOUS | Status: AC
  Administered 2016-10-22: 2 g via INTRAVENOUS
  Filled 2016-10-22: qty 50

## 2016-10-22 MED ORDER — SODIUM CHLORIDE 0.9 % IV BOLUS (SEPSIS)
1000.0000 mL | Freq: Once | INTRAVENOUS | Status: AC
  Administered 2016-10-22: 1000 mL via INTRAVENOUS

## 2016-10-22 MED ORDER — ALBUTEROL (5 MG/ML) CONTINUOUS INHALATION SOLN
10.0000 mg/h | INHALATION_SOLUTION | Freq: Once | RESPIRATORY_TRACT | Status: AC
  Administered 2016-10-22: 10 mg/h via RESPIRATORY_TRACT
  Filled 2016-10-22: qty 20

## 2016-10-22 MED ORDER — ONDANSETRON HCL 4 MG/2ML IJ SOLN: 4.0000 mg | Freq: Four times a day (QID) | INTRAMUSCULAR | Status: DC | PRN

## 2016-10-22 NOTE — Progress Notes (Signed)
ABG ordered but venous blood was obtained. Results given to MD & MD is fine with the results from the venous blood.

## 2016-10-22 NOTE — Progress Notes (Signed)
Patient transported to 4E14 and without issues. Patient in room and comfortable with RN at bedside.

## 2016-10-22 NOTE — ED Notes (Signed)
Attempted to call report

## 2016-10-22 NOTE — H&P (Signed)
History and Physical  Patient Name: Janice Daniels     Janice Daniels:[id number]    DOB: 1946/05/19    DOA: 10/22/2016 PCP: Janice Lopes, MD   Patient coming from: Home  Chief Complaint: Weakness, shortness of breath  HPI: Janice Daniels is a 71 y.o. female with a past medical history significant for lung CA >37yr past chemo/rad, HTN, hypothyroidism, COPD on home O2 recently, CHF EF20-30% who presents with few days worsening dyspnea, and weaknes.  The patient fell and had a hip fracture on the LEFT back in Dec.  This was repaired after she was treated for COPD and CHF flares, Pulm and Cardiology were consulted, she was discharged on home O2 and new ACEi but only spironolactone as diuretic as she appeared to remain euvolemic without Lasix.  She went to rehab and then has been home for a month ro two, but she reports being completely unable to walk since her surgery.  She is now bedbound, uses a bedpan, does not even able to get out of bed to a wheelchair.  Now in the last week or so she has noticed worsening weakness, to the point that she can barely lift her arms or head.  In addition, she has noticed worsening shortness of breath and requiring more home O2.  She has no fever, change in sputum, chest pain, cough.  She has no wheezing.  She has no orthopnea, leg swelling.  She does not know if her medicines have changed recently, but I doubt it because she cannot get out of bed.  EMS gave Solu-medrol 125 mg, and Duo-neb and placed on BiPAP.  ED course: -Temp 99.45F, heart rate 117, respirations 28, BP 119/90, pulse ox 95% on room air -Na 124 (baseline 138), K 3.9, Cr 0.77, WBC 7K, Hgb 12.4 -AST/ALT normal -BNP 732 -Troponin negative -CXR showed old scarring, no edema or pneumonia -ECG showed sinus tachycardia -She was given additional BDs and TRH were asked to admit for COPD flare     ROS: Review of Systems  Constitutional: Negative for chills and fever.  Respiratory: Positive for  shortness of breath and wheezing. Negative for cough, hemoptysis and sputum production.   Cardiovascular: Negative for chest pain, palpitations, orthopnea and leg swelling.  Neurological: Positive for weakness. Negative for speech change, focal weakness and seizures.  All other systems reviewed and are negative.         Past Medical History:  Diagnosis Date  . Arthritis    "back" (05/10/2016)  . Chronic bronchitis ([facility]   . Chronic systolic CHF (congestive heart failure) (Janice Daniels   . COPD (chronic obstructive pulmonary disease) (Janice Daniels   . Depression   . Fall from bed 05/09/2016  . GERD (gastroesophageal reflux disease)   . Heart murmur   . History of chemotherapy    carboplatin and etoposide  . History of radiation therapy 06/05/11 to 07/18/11   lung  . History of radiation therapy 09/13/11 to 09/26/11   brain  . Hx of radiation therapy 10/03/12 - 10/09/12   RUL lung  . Hypertension   . Hypothyroid   . Low back pain   . Lung cancer ([facility] 05/09/2011   RUL  . Lung cancer, upper lobe ([facility] 09/02/2012   rul  . Seizure disorder ([facility]   . Shingles   . Sinus tachycardia   . Stroke ([facility]    "they said ~ 2016; they don't really know when I had it" (05/10/2016)    Past Surgical History:  Procedure  Laterality Date  . FIBEROPTIC BRONCHOSCOPY Right 05/03/2011   endobronchial biopsy  . INTRAMEDULLARY (IM) NAIL INTERTROCHANTERIC Left 05/19/2016   Procedure: INTRAMEDULLARY (IM) NAIL LEFT INTERTROCHANTERIC HIP;  Surgeon: Janice Koyanagi, MD;  Location: [facility];  Service: Orthopedics;  Laterality: Left;  . TONSILLECTOMY    . TUBAL LIGATION  1984  . VAGINAL HYSTERECTOMY  1986    Social History: Patient lives with her husband.  The patient is bedbound.  She smokes.  She is from [place], was a Oceanographer.  Allergies  Allergen Reactions  . Codeine Other (See Comments)    Elevated pulse    Family history: family history includes Cancer in her brother, father, and sister; Cervical  cancer in her sister; Congestive Heart Failure in her father; Esophageal cancer in her brother and father; Heart disease in her mother; Hyperlipidemia in her mother; Hypertension in her mother; Hypothyroidism in her father; Other in her brother, brother, and brother.  Prior to Admission medications   Medication Sig Start Date End Date Taking? Authorizing Provider  alum & mag hydroxide-simeth (MAALOX/MYLANTA) 200-200-20 MG/5ML suspension Take 30 mLs by mouth every 4 (four) hours as needed for indigestion. 05/25/16  Yes Maryann Mikhail, DO  aspirin EC 81 MG EC tablet Take 1 tablet (81 mg total) by mouth daily. 10/30/14  Yes Shanker Kristeen Mans, MD  Fluticasone-Salmeterol (ADVAIR) 250-50 MCG/DOSE AEPB Inhale 1 puff into the lungs 2 (two) times daily.   Yes Historical Provider, MD  gabapentin (NEURONTIN) 300 MG capsule Take 300 mg by mouth at bedtime.  01/27/16  Yes Historical Provider, MD  ipratropium (ATROVENT HFA) 17 MCG/ACT inhaler Inhale 2 puffs into the lungs every 6 (six) hours as needed for wheezing (shortness of breath).    Yes Historical Provider, MD  levETIRAcetam (KEPPRA XR) 500 MG 24 hr tablet Take 1 tablet (500 mg total) by mouth daily. Patient taking differently: Take 500 mg by mouth at bedtime.  05/26/16  Yes Maryann Mikhail, DO  levothyroxine (SYNTHROID, LEVOTHROID) 25 MCG tablet Take 25 mcg by mouth daily before breakfast.  12/23/15  Yes Historical Provider, MD  omeprazole (PRILOSEC) 20 MG capsule Take 20 mg by mouth 2 (two) times daily.  12/31/15  Yes Historical Provider, MD  oxyCODONE-acetaminophen (PERCOCET) 5-325 MG tablet Take 1-2 tablets by mouth every 4 (four) hours as needed for severe pain. 05/19/16  Yes Naiping Ephriam Jenkins, MD  phenol (CHLORASEPTIC) 1.4 % LIQD Use as directed 1 spray in the mouth or throat as needed for throat irritation / pain. 05/25/16  Yes Maryann Mikhail, DO  spironolactone (ALDACTONE) 25 MG tablet Take 0.5 tablets (12.5 mg total) by mouth daily. PLEASE CONTACT OFFICE FOR  ADDITIONAL REFILLS Patient taking differently: Take 12.5 mg by mouth daily.  02/21/16  Yes Minus Breeding, MD  albuterol (VENTOLIN HFA) 108 (90 BASE) MCG/ACT inhaler Inhale 2 puffs into the lungs every 6 (six) hours as needed for wheezing or shortness of breath.     Historical Provider, MD  fluconazole (DIFLUCAN) 100 MG tablet Take 1 tablet (100 mg total) by mouth daily. 05/26/16   Maryann Mikhail, DO  FLUoxetine (PROZAC) 10 MG tablet Take 10 mg by mouth daily. 10/02/16   Historical Provider, MD  ipratropium-albuterol (DUONEB) 0.5-2.5 (3) MG/3ML SOLN Take 3 mLs by nebulization every 6 (six) hours. 10/30/14   Shanker Kristeen Mans, MD  magic mouthwash w/lidocaine SOLN Take 15 mLs by mouth 4 (four) times daily. 05/25/16   Maryann Mikhail, DO  simvastatin (ZOCOR) 20 MG tablet Take  1 tablet (20 mg total) by mouth daily. PLEASE CONTACT OFFICE FOR ADDITIONAL REFILLS 02/02/16   Minus Breeding, MD  Vitamin D, Ergocalciferol, (DRISDOL) 50000 UNITS CAPS capsule Take 50,000 Units by mouth every 7 (seven) days. Monday    Historical Provider, MD       Physical Exam: BP 109/70   Pulse (!) 101   Temp 99.1 F (37.3 C) (Rectal)   Resp (!) 25   SpO2 97%  General appearance: Frail, emaciated elderly adult female, alert but in moderate distress from dyspnea.   Eyes: Anicteric, conjunctiva pink, lids and lashes normal. PERRL.    ENT: No nasal deformity, discharge, epistaxis.  Hearing normal. OP dry without lesions.   Neck: No neck masses.  Trachea midline.  No thyromegaly/tenderness. Lymph: No cervical or supraclavicular lymphadenopathy. Skin: Warm and dry.  No jaundice.  No suspicious rashes or lesions. Cardiac: Tachycardic, regular, nl S1-S2, no murmurs appreciated.  Capillary refill is brisk.  EJs appear prominent.  No LE edema.  Radial pulses 2+ and symmetric. Respiratory: Tachypneic, using accessory muscles, appears dyspneic, wheezing throughout with inspiration and expeiration, coarse. Abdomen: Abdomen soft.  No  TTP. No ascites, distension, hepatosplenomegaly.   MSK: No deformities or effusions.  No cyanosis or clubbing. Neuro: Cranial nerves normal.  Sensation intact to light touch. Speech is fluent.  Muscle strength 3/5, throughout, cannot pull to sitting without assist, cannot lift either leg against gravity.    Psych: Sensorium intact and responding to questions, attention normal.  Behavior appropriate.  Affect blunted.  Judgment and insight appear tired.     Labs on Admission:  I have personally reviewed following labs and imaging studies: CBC:  Recent Labs Lab 10/22/16 1726  WBC 7.0  NEUTROABS 5.5  HGB 12.4  HCT 37.0  MCV 89.4  PLT 382   Basic Metabolic Panel:  Recent Labs Lab 10/22/16 1726  NA 124*  K 3.9  CL 88*  CO2 25  GLUCOSE 108*  BUN <5*  CREATININE 0.77  CALCIUM 8.7*   GFR: CrCl cannot be calculated (Unknown ideal weight.).  Liver Function Tests:  Recent Labs Lab 10/22/16 1726  AST 20  ALT 5*  ALKPHOS 67  BILITOT 0.8  PROT 5.9*  ALBUMIN 3.4*   No results for input(s): LIPASE, AMYLASE in the last 168 hours. No results for input(s): AMMONIA in the last 168 hours. Coagulation Profile: No results for input(s): INR, PROTIME in the last 168 hours. Cardiac Enzymes: No results for input(s): CKTOTAL, CKMB, CKMBINDEX, TROPONINI in the last 168 hours. BNP (last 3 results) No results for input(s): PROBNP in the last 8760 hours. HbA1C: No results for input(s): HGBA1C in the last 72 hours. CBG: No results for input(s): GLUCAP in the last 168 hours. Lipid Profile: No results for input(s): CHOL, HDL, LDLCALC, TRIG, CHOLHDL, LDLDIRECT in the last 72 hours. Thyroid Function Tests: No results for input(s): TSH, T4TOTAL, FREET4, T3FREE, THYROIDAB in the last 72 hours. Anemia Panel: No results for input(s): VITAMINB12, FOLATE, FERRITIN, TIBC, IRON, RETICCTPCT in the last 72 hours. Sepsis Labs:  Invalid input(s): PROCALCITONIN, LACTICIDVEN No results found for  this or any previous visit (from the past 240 hour(s)).       Radiological Exams on Admission: Personally reviewed CXR shows old R scarring, no new opacity or edema: Dg Chest Portable 1 View  Result Date: 10/22/2016 CLINICAL DATA:  Shortness of breath for 3 days EXAM: PORTABLE CHEST 1 VIEW COMPARISON:  Multiple chest x-rays since May 18, 2016 FINDINGS: Stable postradiation  changes in the right perihilar region are unchanged. No pneumothorax. The cardiomediastinal silhouette is stable. No acute abnormality identified. IMPRESSION: No acute interval change. Stable postradiation changes on the right. Electronically Signed   By: Dorise Bullion III M.D   On: 10/22/2016 17:35    EKG: Independently reviewed. Rate 115, QTc 491, LAFB old.  Echocardiogram 2017: Report reviewed, poor quality evidently EF 20-25% or possibly 30-35% (per Cards notes) Grade I DD        Assessment/Plan  1. Acute on chronic hypoxic respiratory failure:  Multifactorial from COPD, CHF, weakness.  No pneumonia, low concern for PE, ACS.     2. COPD exacerbation:  -Solu-Medrol 60 mg daily -The dilator scheduled and when necessary -Continue Advair  3. Acute on chronic systolic and diastolic CHF:  EF 43-15%, grade 1 diastolic dysfunction, BNP 700. -Furosemide 40 mg daily -Potassium supplement -On BipAP tonight, likely off in next few hours --> to stepdown tonight -Strict I/Os -Daily BMP -Continue spironolactone -Not on ACE inhibitor because of low blood pressure, not on beta blocker because of COPD  4. Hyponatremia:  -Check urinalysis, urine sodium, osmolality ratio -Fluid restriction 1200 mL per day for now -Check TSH  5. Hypothyroidism:  -Continue levothyroxine  6. History of seizures:  She denies seizures, states she takes Keppra just for "passing out". -Continue Keppra  7. Other medications:  -Continue PPI -Continue gabapentin -Continue fluoxetine -Continue Xarelto, patient doesn't know  why  8. Lung cancer: Remote chemo-rad.  Patient of Dr. Earlie Server.  9. Failure to thrive: Patient is losing weight, completely immobile, depressed. She states that she "can't walk anymore" after her hip, failed rehabilitation, husband wouldn't pay $25 co-pay for PT at home. Given her chronic respiratory failure and chronic heart failure, and overall deconditioning, I suspect that she has less than 6 months to live. -Consult Palliative Care for goals of care         DVT prophylaxis: Xarelto  Code Status: FULL  Family Communication: None at bedside  Disposition Plan: Anticipate diuresis, steroids, nebs.  Palliative care consult.  Likely placement. Consults called: None Admission status: INPATIent         Medical decision making: Patient seen at 8:16 PM on 10/22/2016.  The patient was discussed with Dr. Ellender Hose.  What exists of the patient's chart was reviewed in depth and summarized above.  Clinical condition: stable but overall Poor.        Edwin Dada [facility] Pager [phone]        At the time of admission, it appears that the appropriate admission status for this patient is INPATIENT. This is judged to be reasonable and necessary in order to provide the required intensity of service to ensure the patient's safety given the presenting symptoms, physical exam findings, and initial radiographic and laboratory data in the context of their chronic comorbidities.  Together, these circumstances are felt to place her at high risk for further clinical deterioration threatening life, limb, or organ.   Patient requires inpatient status due to high intensity of service, high risk for further deterioration and high frequency of surveillance required because of this severe exacerbation of their chronic organ failure.  Factors that contribute to requirement for inpatient status include: Acute hypoxic respiratory failure with respiratory distress requiring BiPAP,  COPD exacerbation, and CHF In the setting of hyponatremia acute, chronic systolic dysfunction EF 19%, and failure to thrive.  I certify that at the point of admission it is my clinical judgment that the patient  will require inpatient hospital care spanning beyond 2 midnights from the point of admission and that early discharge would result in unnecessary risk of decompensation and readmission or threat to life, limb or bodily function.

## 2016-10-22 NOTE — ED Provider Notes (Signed)
[facility] DEPT MHP Provider Note   CSN: [id number] Arrival date & time: 10/22/16  1703     History   Chief Complaint Chief Complaint  Patient presents with  . Shortness of Breath    HPI Janice Daniels is a 71 y.o. female.  HPI   71 yo F with PMHx of COPD, h/o lung CA not currently on chemo, here with SOB. History limited 2/2 dyspnea but pt states that she has had progressively worsening SOB and cough for 2 days. She has had associated nausea and poor appetite. She has not had fevers. Over the past 24 hours, her sx have worsened and she is now severely SOB. Having difficulty speaking due to dyspnea. No known sick contacts. H/o COPD exacerbations in the past similar to this.   Level 5 caveat invoked as remainder of history, ROS, and physical exam limited due to patient's respiratory distress.   Past Medical History:  Diagnosis Date  . Arthritis    "back" (05/10/2016)  . Chronic bronchitis ([place])   . Chronic systolic CHF (congestive heart failure) ([place])   . COPD (chronic obstructive pulmonary disease) ([place])   . Depression   . Fall from bed 05/09/2016  . GERD (gastroesophageal reflux disease)   . Heart murmur   . History of chemotherapy    carboplatin and etoposide  . History of radiation therapy 06/05/11 to 07/18/11   lung  . History of radiation therapy 09/13/11 to 09/26/11   brain  . Hx of radiation therapy 10/03/12 - 10/09/12   RUL lung  . Hypertension   . Hypothyroid   . Low back pain   . Lung cancer ([place]) 05/09/2011   RUL  . Lung cancer, upper lobe ([place]) 09/02/2012   rul  . Seizure disorder ([place])   . Shingles   . Sinus tachycardia   . Stroke [facility])    "they said ~ 2016; they don't really know when I had it" (05/10/2016)    Patient Active Problem List   Diagnosis Date Noted  . COPD with acute exacerbation ([place]) 10/22/2016  . Sinus tachycardia 05/22/2016  . Postoperative anemia due to acute blood loss   . AKI (acute kidney injury) ([place])   .  Hyponatremia 05/10/2016  . 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 Partial seizure ([place]) 11/10/2015  . Constipation 05/18/2015  . Chronic systolic heart failure ([place]) 11/06/2014  . Acute on chronic systolic CHF (congestive heart failure) ([place])   . HCAP (healthcare-associated pneumonia)   . Acute renal failure syndrome ([place])   . Other specified hypothyroidism   . Cancer of upper lobe of right lung ([place])   . Tobacco abuse   . Acute on chronic respiratory failure with hypoxia ([place]) 10/26/2014  . Lung cancer ([place]) 05/09/2011  . THROMBOCYTOSIS 08/29/2010  . GERD 08/12/2010  . OSTEOPENIA 03/16/2009  . COLONIC POLYPS 03/11/2009  . HYPOKALEMIA 07/07/2008  . DEPRESSION 03/15/2007  . Essential hypertension 03/15/2007  . COPD (chronic obstructive pulmonary disease) with emphysema ([place]) 03/15/2007  . OSTEOARTHRITIS, FINGERS 03/15/2007    Past Surgical History:  Procedure Laterality Date  . FIBEROPTIC BRONCHOSCOPY Right 05/03/2011   endobronchial biopsy  . INTRAMEDULLARY (IM) NAIL INTERTROCHANTERIC Left 05/19/2016   Procedure: INTRAMEDULLARY (IM) NAIL LEFT INTERTROCHANTERIC HIP;  Surgeon: Leandrew Koyanagi, MD;  Location: [facility];  Service: Orthopedics;  Laterality: Left;  . TONSILLECTOMY    . TUBAL LIGATION  1984  . VAGINAL HYSTERECTOMY  1986    OB History    No data available  Home Medications    Prior to Admission medications   Medication Sig Start Date End Date Taking? Authorizing Provider  albuterol (VENTOLIN HFA) 108 (90 BASE) MCG/ACT inhaler Inhale 2 puffs into the lungs every 6 (six) hours as needed for wheezing or shortness of breath.    Yes Historical Provider, MD  alum & mag hydroxide-simeth (MAALOX/MYLANTA) 200-200-20 MG/5ML suspension Take 30 mLs by mouth every 4 (four) hours as needed for indigestion. 05/25/16  Yes Maryann Mikhail, DO  aspirin EC 81 MG EC tablet Take 1 tablet (81 mg total) by mouth daily. 10/30/14  Yes Shanker Kristeen Mans, MD  bismuth subsalicylate (PEPTO  BISMOL) 262 MG/15ML suspension Take 30 mLs by mouth every 6 (six) hours as needed for indigestion or diarrhea or loose stools.   Yes Historical Provider, MD  FLUoxetine (PROZAC) 10 MG tablet Take 10 mg by mouth daily. 10/02/16  Yes Historical Provider, MD  Fluticasone-Salmeterol (ADVAIR) 250-50 MCG/DOSE AEPB Inhale 1 puff into the lungs 2 (two) times daily.   Yes Historical Provider, MD  gabapentin (NEURONTIN) 300 MG capsule Take 300 mg by mouth at bedtime.  01/27/16  Yes Historical Provider, MD  ipratropium (ATROVENT HFA) 17 MCG/ACT inhaler Inhale 2 puffs into the lungs every 6 (six) hours as needed for wheezing (shortness of breath).    Yes Historical Provider, MD  ipratropium-albuterol (DUONEB) 0.5-2.5 (3) MG/3ML SOLN Take 3 mLs by nebulization every 6 (six) hours. 10/30/14  Yes Shanker Kristeen Mans, MD  levETIRAcetam (KEPPRA XR) 500 MG 24 hr tablet Take 1 tablet (500 mg total) by mouth daily. Patient taking differently: Take 500 mg by mouth at bedtime.  05/26/16  Yes Maryann Mikhail, DO  levothyroxine (SYNTHROID, LEVOTHROID) 25 MCG tablet Take 25 mcg by mouth daily before breakfast.  12/23/15  Yes Historical Provider, MD  omeprazole (PRILOSEC) 20 MG capsule Take 20 mg by mouth 2 (two) times daily.  12/31/15  Yes Historical Provider, MD  OXYGEN Inhale 2.75 L into the lungs as needed (shortness of breath).   Yes Historical Provider, MD  phenol (CHLORASEPTIC) 1.4 % LIQD Use as directed 1 spray in the mouth or throat as needed for throat irritation / pain. 05/25/16  Yes Maryann Mikhail, DO  rivaroxaban (XARELTO) 10 MG TABS tablet Take 10 mg by mouth daily.   Yes Historical Provider, MD  spironolactone (ALDACTONE) 25 MG tablet Take 0.5 tablets (12.5 mg total) by mouth daily. PLEASE CONTACT OFFICE FOR ADDITIONAL REFILLS Patient taking differently: Take 12.5 mg by mouth daily.  02/21/16  Yes Minus Breeding, MD  Vitamin D, Ergocalciferol, (DRISDOL) 50000 UNITS CAPS capsule Take 50,000 Units by mouth every Monday.  Monday    Yes Historical Provider, MD  oxyCODONE-acetaminophen (PERCOCET) 5-325 MG tablet Take 1-2 tablets by mouth every 4 (four) hours as needed for severe pain. Patient not taking: Reported on 10/22/2016 05/19/16   Leandrew Koyanagi, MD  simvastatin (ZOCOR) 20 MG tablet Take 1 tablet (20 mg total) by mouth daily. PLEASE CONTACT OFFICE FOR ADDITIONAL REFILLS Patient not taking: Reported on 10/22/2016 02/02/16   Minus Breeding, MD    Family History Family History  Problem Relation Age of Onset  . Esophageal cancer Father   . Hypothyroidism Father   . Cancer Father     esophageal  . Congestive Heart Failure Father   . Heart disease Mother   . Hyperlipidemia Mother   . Hypertension Mother     heart disease  . Other Brother     low back pain  .  Cancer Brother     esophageal  . Other Brother     hypochondriasis  . Other Brother     suicide  . Esophageal cancer Brother   . Cervical cancer Sister   . Cancer Sister     cervical    Social History Social History  Substance Use Topics  . Smoking status: Current Some Day Smoker    Packs/day: 0.50    Years: 55.00    Types: Cigarettes    Last attempt to quit: 11/22/2014  . Smokeless tobacco: Never Used  . Alcohol use No     Allergies   Codeine   Review of Systems Review of Systems  Constitutional: Positive for appetite change, chills and fatigue. Negative for fever.  HENT: Negative for congestion, rhinorrhea and sore throat.   Eyes: Negative for visual disturbance.  Respiratory: Positive for cough, chest tightness, shortness of breath and wheezing.   Cardiovascular: Negative for chest pain and leg swelling.  Gastrointestinal: Negative for abdominal pain, diarrhea, nausea and vomiting.  Genitourinary: Negative for dysuria, flank pain, vaginal bleeding and vaginal discharge.  Musculoskeletal: Negative for neck pain.  Skin: Negative for rash.  Allergic/Immunologic: Negative for immunocompromised state.  Neurological: Positive for  weakness. Negative for syncope and headaches.  Hematological: Does not bruise/bleed easily.  All other systems reviewed and are negative.    Physical Exam Updated Vital Signs BP 114/70 (BP Location: Right Arm)   Pulse (!) 109   Temp 98 F (36.7 C) (Axillary)   Resp (!) 24   Ht '5\' 3"'$  (1.6 m)   Wt 90 lb 6.2 oz (41 kg)   SpO2 94%   BMI 16.01 kg/m   Physical Exam  Constitutional: She is oriented to person, place, and time. She appears well-developed and well-nourished. She has a sickly appearance. She appears ill. She appears distressed.  HENT:  Head: Normocephalic and atraumatic.  Eyes: Conjunctivae are normal.  Neck: Neck supple.  Cardiovascular: Normal rate, regular rhythm and normal heart sounds.  Exam reveals no friction rub.   No murmur heard. Pulmonary/Chest: Accessory muscle usage present. Tachypnea noted. She is in respiratory distress. She has decreased breath sounds. She has no rales.  Abdominal: She exhibits no distension.  Musculoskeletal: She exhibits no edema.  Neurological: She is alert and oriented to person, place, and time. She exhibits normal muscle tone.  Skin: Skin is warm. Capillary refill takes less than 2 seconds.  Psychiatric: She has a normal mood and affect.  Nursing note and vitals reviewed.    ED Treatments / Results  Labs (all labs ordered are listed, but only abnormal results are displayed) Labs Reviewed  COMPREHENSIVE METABOLIC PANEL - Abnormal; Notable for the following:       Result Value   Sodium 124 (*)    Chloride 88 (*)    Glucose, Bld 108 (*)    BUN <5 (*)    Calcium 8.7 (*)    Total Protein 5.9 (*)    Albumin 3.4 (*)    ALT 5 (*)    All other components within normal limits  BRAIN NATRIURETIC PEPTIDE - Abnormal; Notable for the following:    B Natriuretic Peptide 732.1 (*)    All other components within normal limits  URINALYSIS, ROUTINE W REFLEX MICROSCOPIC - Abnormal; Notable for the following:    APPearance CLOUDY (*)     Hgb urine dipstick SMALL (*)    Bacteria, UA RARE (*)    Squamous Epithelial / LPF 0-5 (*)  All other components within normal limits  OSMOLALITY, URINE - Abnormal; Notable for the following:    Osmolality, Ur 201 (*)    All other components within normal limits  BASIC METABOLIC PANEL - Abnormal; Notable for the following:    Sodium 133 (*)    Chloride 96 (*)    Glucose, Bld 133 (*)    BUN <5 (*)    Calcium 8.7 (*)    All other components within normal limits  CBC - Abnormal; Notable for the following:    WBC 3.3 (*)    Hemoglobin 11.9 (*)    HCT 35.0 (*)    All other components within normal limits  TSH - Abnormal; Notable for the following:    TSH 17.824 (*)    All other components within normal limits  I-STAT VENOUS BLOOD GAS, ED - Abnormal; Notable for the following:    pCO2, Ven 42.1 (*)    pO2, Ven 63.0 (*)    All other components within normal limits  I-STAT VENOUS BLOOD GAS, ED - Abnormal; Notable for the following:    pO2, Ven 26.0 (*)    Bicarbonate 29.9 (*)    All other components within normal limits  I-STAT VENOUS BLOOD GAS, ED - Abnormal; Notable for the following:    pCO2, Ven 38.2 (*)    All other components within normal limits  MRSA PCR SCREENING  CULTURE, BLOOD (ROUTINE X 2)  CULTURE, BLOOD (ROUTINE X 2)  CBC WITH DIFFERENTIAL/PLATELET  SODIUM, URINE, RANDOM  CREATININE, URINE, RANDOM  I-STAT TROPOININ, ED    EKG  EKG Interpretation  Date/Time:  Sunday October 22 2016 17:23:06 EDT Ventricular Rate:  115 PR Interval:    QRS Duration: 105 QT Interval:  355 QTC Calculation: 491 R Axis:   -37 Text Interpretation:  Sinus tachycardia Left axis deviation Abnormal R-wave progression, early transition Nonspecific T abnrm, anterolateral leads Borderline prolonged QT interval Artifact in lead(s) II III aVL aVF V2 V3 V4 V5 V6 No significant change since last tracing Confirmed by Eh Sauseda MD, Lysbeth Galas [phone]) on 10/22/2016 5:29:50 PM Also confirmed by Ellender Hose MD,  Vale Mousseau [phone]), editor Drema Pry [phone])  on 10/23/2016 7:24:05 AM       Radiology Dg Chest Portable 1 View  Result Date: 10/22/2016 CLINICAL DATA:  Shortness of breath for 3 days EXAM: PORTABLE CHEST 1 VIEW COMPARISON:  Multiple chest x-rays since May 18, 2016 FINDINGS: Stable postradiation changes in the right perihilar region are unchanged. No pneumothorax. The cardiomediastinal silhouette is stable. No acute abnormality identified. IMPRESSION: No acute interval change. Stable postradiation changes on the right. Electronically Signed   By: Dorise Bullion III M.D   On: 10/22/2016 17:35    Procedures .Critical Care Performed by: Duffy Bruce Authorized by: Duffy Bruce   Critical care provider statement:    Critical care time (minutes):  45   Critical care time was exclusive of:  Separately billable procedures and treating other patients   Critical care was necessary to treat or prevent imminent or life-threatening deterioration of the following conditions:  Respiratory failure and dehydration   Critical care was time spent personally by me on the following activities:  Development of treatment plan with patient or surrogate, discussions with consultants, evaluation of patient's response to treatment, ordering and performing treatments and interventions, ordering and review of laboratory studies, ordering and review of radiographic studies, pulse oximetry, re-evaluation of patient's condition, review of old charts, examination of patient and obtaining history from patient or surrogate  I assumed direction of critical care for this patient from another provider in my specialty: no     (including critical care time)  Medications Ordered in ED Medications  sodium chloride flush (NS) 0.9 % injection 3 mL (3 mLs Intravenous Given 10/23/16 1001)  potassium chloride SA (K-DUR,KLOR-CON) CR tablet 40 mEq (40 mEq Oral Given 10/23/16 1000)  furosemide (LASIX) injection 40 mg  (40 mg Intravenous Given 10/23/16 1000)  albuterol (PROVENTIL) (2.5 MG/3ML) 0.083% nebulizer solution 2.5 mg (not administered)  ondansetron (ZOFRAN) tablet 4 mg (not administered)    Or  ondansetron (ZOFRAN) injection 4 mg (not administered)  acetaminophen (TYLENOL) tablet 650 mg (not administered)    Or  acetaminophen (TYLENOL) suppository 650 mg (not administered)  methylPREDNISolone sodium succinate (SOLU-MEDROL) 125 mg/2 mL injection 60 mg (60 mg Intravenous Given 10/23/16 1000)  FLUoxetine (PROZAC) capsule 10 mg (10 mg Oral Given 10/23/16 0959)  mometasone-formoterol (DULERA) 200-5 MCG/ACT inhaler 2 puff (2 puffs Inhalation Given 10/23/16 0846)  rivaroxaban (XARELTO) tablet 10 mg (10 mg Oral Given 10/23/16 1001)  levETIRAcetam (KEPPRA XR) 24 hr tablet 500 mg (500 mg Oral Not Given 10/22/16 2200)  spironolactone (ALDACTONE) tablet 12.5 mg (12.5 mg Oral Given 10/23/16 0959)  gabapentin (NEURONTIN) capsule 300 mg (300 mg Oral Not Given 10/22/16 2200)  levothyroxine (SYNTHROID, LEVOTHROID) tablet 25 mcg (25 mcg Oral Given 10/23/16 0959)  pantoprazole (PROTONIX) EC tablet 40 mg (40 mg Oral Given 10/23/16 0959)  aspirin EC tablet 81 mg (81 mg Oral Given 10/23/16 1000)  ipratropium-albuterol (DUONEB) 0.5-2.5 (3) MG/3ML nebulizer solution 3 mL (3 mLs Nebulization Given 10/23/16 0846)  feeding supplement (ENSURE ENLIVE) (ENSURE ENLIVE) liquid 237 mL (237 mLs Oral Given 10/23/16 1000)  magnesium sulfate IVPB 2 g 50 mL (0 g Intravenous Stopped 10/22/16 1902)  sodium chloride 0.9 % bolus 1,000 mL (0 mLs Intravenous Stopped 10/22/16 1902)  albuterol (PROVENTIL,VENTOLIN) solution continuous neb (10 mg/hr Nebulization Given 10/22/16 1724)  ipratropium (ATROVENT) nebulizer solution 1 mg (1 mg Nebulization Given 10/22/16 1724)     Initial Impression / Assessment and Plan / ED Course  I have reviewed the triage vital signs and the nursing notes.  Pertinent labs & imaging results that were available during my care  of the patient were reviewed by me and considered in my medical decision making (see chart for details).     71 yo F with PMHx as above here with severe dyspnea, cough x 2 days. On arrival, tp tachycardic, tachypneic in significant resp distress. Pt already received 125 solumedrol. Will place on BIPAP, start CAT, IV Mag, IVF, and re-assess. Stat portable CXR without focal abnormality, no PTX. Pt unsure of code status but is full code by default at this time.  Blood gas reassuring. WOB is improved on BIPAP but she remains tachypneic. Labs overall reassuring. Repeat ABG improving. Will admit to step down for continued management of COPD exacerbation, acute on chronic hypoxic resp failure.  Final Clinical Impressions(s) / ED Diagnoses   Final diagnoses:  COPD exacerbation ([place])  Respiratory distress    New Prescriptions Current Discharge Medication List       Duffy Bruce, MD 10/23/16 1158

## 2016-10-22 NOTE — Progress Notes (Signed)
Patient unable to void and I & O cath done 829m emptied from bladder with sediment and amber color. Urine specimen sent to lab.

## 2016-10-22 NOTE — ED Triage Notes (Signed)
Pt BIB EMS from home for SOB x 3 days. Home breathing txs with no relief; received 1 duoneb, 1 albuterol, and 125 mg solumedrol in route. Normally on 3 L [place].  Hx COPD and lung cancer; not receiving tx. A&Ox4; Resp labored. RT at bedside. EDP at bedside, states pt is a full code.

## 2016-10-22 NOTE — ED Notes (Signed)
Admitting at bedside, taking pt off bipap so he can speak with pt.

## 2016-10-23 DIAGNOSIS — Z515 Encounter for palliative care: Secondary | ICD-10-CM

## 2016-10-23 DIAGNOSIS — Z7189 Other specified counseling: Secondary | ICD-10-CM

## 2016-10-23 LAB — TSH: TSH: 17.824 u[IU]/mL — ABNORMAL HIGH (ref 0.350–4.500)

## 2016-10-23 LAB — CBC
HEMATOCRIT: 35 % — AB (ref 36.0–46.0)
Hemoglobin: 11.9 g/dL — ABNORMAL LOW (ref 12.0–15.0)
MCH: 30.7 pg (ref 26.0–34.0)
MCHC: 34 g/dL (ref 30.0–36.0)
MCV: 90.4 fL (ref 78.0–100.0)
Platelets: 205 10*3/uL (ref 150–400)
RBC: 3.87 MIL/uL (ref 3.87–5.11)
RDW: 14.8 % (ref 11.5–15.5)
WBC: 3.3 10*3/uL — AB (ref 4.0–10.5)

## 2016-10-23 LAB — BASIC METABOLIC PANEL
Anion gap: 9 (ref 5–15)
BUN: <5 mg/dL — ABNORMAL LOW (ref 6–20)
CHLORIDE: 96 mmol/L — AB (ref 101–111)
CO2: 28 mmol/L (ref 22–32)
Calcium: 8.7 mg/dL — ABNORMAL LOW (ref 8.9–10.3)
Creatinine, Ser: 0.74 mg/dL (ref 0.44–1.00)
GFR calc Af Amer: >60 mL/min (ref >60)
GLUCOSE: 133 mg/dL — AB (ref 65–99)
POTASSIUM: 4.1 mmol/L (ref 3.5–5.1)
Sodium: 133 mmol/L — ABNORMAL LOW (ref 135–145)

## 2016-10-23 LAB — MRSA PCR SCREENING: MRSA BY PCR: NEGATIVE

## 2016-10-23 MED ORDER — LEVOTHYROXINE SODIUM 50 MCG PO TABS
50.0000 ug | ORAL_TABLET | Freq: Every day | ORAL | Status: DC
  Administered 2016-10-24 – 2016-10-27 (×4): 50 ug via ORAL
  Filled 2016-10-23 (×4): qty 1

## 2016-10-23 MED ORDER — ENSURE ENLIVE PO LIQD
237.0000 mL | Freq: Three times a day (TID) | ORAL | Status: DC
  Administered 2016-10-24 – 2016-10-27 (×5): 237 mL via ORAL

## 2016-10-23 MED ORDER — IPRATROPIUM-ALBUTEROL 0.5-2.5 (3) MG/3ML IN SOLN
3.0000 mL | Freq: Four times a day (QID) | RESPIRATORY_TRACT | Status: DC
  Administered 2016-10-23 – 2016-10-26 (×11): 3 mL via RESPIRATORY_TRACT
  Filled 2016-10-23 (×11): qty 3

## 2016-10-23 NOTE — Evaluation (Signed)
Occupational Therapy Evaluation Patient Details Name: Janice Daniels MRN: [id number] DOB: [birth year] Today's Date: 10/23/2016    History of Present Illness Janice Daniels is a 71 y.o. female with a past medical history significant for lung CA >57yr past chemo/rad, HTN, hypothyroidism, COPD on home O2 recently, CHF EF20-30% who presents with few days worsening dyspnea, and weakness. Left Hip fx s/p sx 12/17, went to rehab, but has essentially been bed bound since then (relies on husband for everything). Current dx: Acute on chronic hypoxic respiratory failure   Clinical Impression   This 71yo female admitted with above presents to acute OT with deficits below (see OT problem list) thus affecting her PLOF up until Dec as being totally ambulatory and doing her basic ADLs--since that time she has been basically total care, but is able to mobilize as she demonstrated to [place] She will benefit from acute OT with follow up OT at SNF would be best option, but if pt declines than HHOT, HHAide, and HHSW.    Follow Up Recommendations  SNF;Supervision/Assistance - 24 hour;Other (comment) (pt refers home--if this is where she goes then recommneds HHOT, HHAide and HHSW to address caregiving concerns noted in chart)    Equipment Recommendations  None recommended by OT       Precautions / Restrictions Precautions Precautions: Fall Restrictions Weight Bearing Restrictions: No      Mobility Bed Mobility Overal bed mobility: Needs Assistance Bed Mobility: Supine to Sit     Supine to sit: Mod assist;+2 for physical assistance;HOB elevated        Transfers Overall transfer level: Needs assistance Equipment used: Rolling walker (2 wheeled) Transfers: Sit to/from SOmnicareSit to Stand: Mod assist;+2 physical assistance Stand pivot transfers: Mod assist;+2 physical assistance            Balance Overall balance assessment: Needs assistance Sitting-balance  support: Bilateral upper extremity supported;Feet supported Sitting balance-Leahy Scale: Poor Sitting balance - Comments: reliant on Bil UEs and pushing, right lateral lean   Standing balance support: Bilateral upper extremity supported Standing balance-Leahy Scale: Zero Standing balance comment: unable to achieve full upright standing                           ADL either performed or assessed with clinical judgement   ADL Overall ADL's : Needs assistance/impaired Eating/Feeding: Set up (supported sitting)   Grooming: Minimal assistance (supported sitting)   Upper Body Bathing: Set up;Supervision/ safety (supported sitting)   Lower Body Bathing: Maximal assistance (+2  mod A sit<>stand)   Upper Body Dressing : Maximal assistance (suppported sitting)   Lower Body Dressing: Total assistance (Mod A +2 sit<>stand)   Toilet Transfer: Moderate assistance;+2 for physical assistance;Squat-pivot   Toileting- Clothing Manipulation and Hygiene: Total assistance (+2 Mod A sit<>stand)                            Pertinent Vitals/Pain Pain Assessment: No/denies pain     Hand Dominance Right   Extremity/Trunk Assessment Upper Extremity Assessment Upper Extremity Assessment: Generalized weakness   Lower Extremity Assessment Lower Extremity Assessment: Defer to PT evaluation       Communication Communication Communication: No difficulties   Cognition Arousal/Alertness: Awake/alert Behavior During Therapy: WFL for tasks assessed/performed Overall Cognitive Status: Within Functional Limits for tasks assessed  Home Living Family/patient expects to be discharged to:: Skilled nursing facility Living Arrangements: Spouse/significant other Available Help at Discharge: Family;Available 24 hours/day Type of Home: House Home Access: Stairs to enter [employer] of Steps: 4 Entrance  Stairs-Rails: Right;Left;Can reach both Home Layout: One level     Bathroom Shower/Tub: Tub/shower unit;Door (pt only does be bad baths)   Bathroom Toilet: Standard (pt uses diapers--does not get up for bathroom)     Home Equipment: Walker - 4 wheels;Cane - single point;Wheelchair - manual          Prior Functioning/Environment Level of Independence: Needs assistance  Gait / Transfers Assistance Needed: Per pt she is non ambulatory, when she does get OOB her husband physically lifts her              OT Problem List: Decreased strength;Impaired balance (sitting and/or standing);Decreased activity tolerance;Decreased knowledge of use of DME or AE;Cardiopulmonary status limiting activity      OT Treatment/Interventions: Self-care/ADL training;Therapeutic activities;Patient/family education;DME and/or AE instruction;Balance training    OT Goals(Current goals can be found in the care plan section) Acute Rehab OT Goals Patient Stated Goal: to go home OT Goal Formulation: With patient Time For Goal Achievement: 11/06/16 Potential to Achieve Goals: Good  OT Frequency: Min 2X/week           Co-evaluation PT/OT/SLP Co-Evaluation/Treatment: Yes Reason for Co-Treatment: For patient/therapist safety;To address functional/ADL transfers   OT goals addressed during session: ADL's and self-care;Strengthening/ROM      End of Session Equipment Utilized During Treatment: Gait belt Nurse Communication: Mobility status (could use lift to get her back or +2 Mod A stand/squat pivot)  Activity Tolerance:  (limited by increased work of breathing while active) Patient left: in chair;with call bell/phone within reach;with chair alarm set  OT Visit Diagnosis: Unsteadiness on feet (R26.81);Muscle weakness (generalized) (M62.81);Adult, failure to thrive (R62.7)                Time: 2258-3462 OT Time Calculation (min): 23 min Charges:  OT General Charges $OT Visit: 1 Procedure OT  Evaluation $OT Eval Moderate Complexity: 1 Procedure [place], OTR/L [phone] 10/23/2016

## 2016-10-23 NOTE — Consult Note (Signed)
Consultation Note Date: 10/23/2016   Patient Name: Janice Daniels  DOB: 1946-01-11  MRN: [id number]  Age / Sex: 71 y.o., female  PCP: Janice Battles, MD Referring Physician: Cherene Altes, MD  Reason for Consultation: Disposition, Establishing goals of care and Psychosocial/spiritual support  HPI/Patient Profile: 71 y.o. female  with past medical history of COPD on home O2, CHF (EF 20-25%), lung Ca >55yr past treatment, HTN, hypothyroidism, depression, and recent hip fracture (S/p ORIF. IM nail on 05/19/2016). Since her hip fracture she has been bed-bound and is now entirely reliant on her husband for care. She now presents with weakness and shortness of breath, and was admitted on 10/22/2016 with COPD exacerbation, CHF exacerbation, and hyponatremia. Palliative consulted to assist in goals of care given her failure to thrive with weight loss, immobility, and depression.   Clinical Assessment and Goals of Care: I met Janice Daniels at her bedside. I introduced myself and explained that Palliative Medicine specializes in medical care for people living with serious illness and focuses on providing relief from symptoms and navigating difficult medical decisions. She was open to meeting with me.  Janice Daniels relates a progressive health decline since her hip fracture in November. Prior to November she was fully independent and enjoying time outside of the home, specifically going to flea markets. After the hip fracture she has remained bed-bound and now fully dependent on her husband for ADLs.  She believes she is capable of doing things for herself, such as increasing her exercise and getting out of bed, but relates that her husband is overly protective and will not let her. At this point, she has reconciled herself to reliance on him. Additionally, she has found herself increasingly disinterested in food with resultant marked weight loss (she was  123lb in November, now 90lb). She believes she can eat (no nausea/vomiting, diarrhea, etc.) but does not like the available food. Of note, she reports that her husband does provide her with her favored food, however she hasn't been able to put on weight.  In discussing her health issues she is very knowledgeable about her COPD and understands it is a progressive and chronic condition. She had very poor understanding of her heart issues, which I discussed at length with her. I emphasized that CHF was also a progressive and chronic issue, and touched on the relationship between the heart and fluid issues. She was tearful on hearing this information on her heart but felt like it helped explain why she was feeling so poorly in terms of her breathing and energy.  In terms of the future, Janice Daniels feels like she doesn't have much time left. She knows her body is becoming weaker and she understands her health issues are only going to get worse. She has accepted she may be approaching the end of her life, which she expects is months away, and has already paid for funeral arrangements. That said, she is still interested in pursuing interventions that would enable her to live longer and intends to return to the hospital when she feels poorly. We discussed the possibility that there may come a point when her time at the hospital isn't enabling her to feel better, but rather keeping her away from home. She accepts this possibility, but feels hospitalization still provides greater benefit than burden at this point. She is already familiar with hospice, as her mother died under their care this past May, and plans to utilize their services in the future.  Finally, I did  have a chance to talk with her husband over the phone. He has a lot of anger and frustration surrounding her cancer care and the long term side effects from treatment. As we talked through his frustrations he was able to articulate that he understood  his wife wasn't doing well at present and likely didn't have long to live because of her many health issues. He hasn't discussed this with her as he was concerned it would upset her. He agreed to meet with me and her in the next day or so to discuss her health (she requested that I facilitate a meeting; she wants him to be part of her decision making in terms of code status and progressive care needs).   Primary Decision Maker PATIENT -If she were unable to make decisions, she would want her husband to be her decision maker.   SUMMARY OF RECOMMENDATIONS    Full code; husband to schedule a meeting with me for tomorrow to cooperatively discuss code status and progressive care needs  Will not start an appetite stimulant at present, per pt preference  Code Status/Advance Care Planning:  Full code  Palliative Prophylaxis:   Turn Reposition  Additional Recommendations (Limitations, Scope, Preferences):  Full Scope Treatment  Psycho-social/Spiritual:   Desire for further Chaplaincy support:no  Additional Recommendations: TBD  Prognosis:   < 6 months; pt with marked weight loss and increased debility in the setting of CHF and COPD.   Discharge Planning: To Be Determined      Primary Diagnoses: Present on Admission: . COPD with acute exacerbation ([place]) . Hyponatremia . Lung cancer ([place]) . Essential hypertension . Acute on chronic respiratory failure with hypoxia ([place]) . Other specified hypothyroidism . Acute on chronic systolic CHF (congestive heart failure) ([place]) . Tobacco abuse   I have reviewed the medical record, interviewed the patient and family, and examined the patient. The following aspects are pertinent.  Past Medical History:  Diagnosis Date  . Arthritis    "back" (05/10/2016)  . Chronic bronchitis ([place])   . Chronic systolic CHF (congestive heart failure) ([place])   . COPD (chronic obstructive pulmonary disease) ([place])   . Depression   . Fall from bed  05/09/2016  . GERD (gastroesophageal reflux disease)   . Heart murmur   . History of chemotherapy    carboplatin and etoposide  . History of radiation therapy 06/05/11 to 07/18/11   lung  . History of radiation therapy 09/13/11 to 09/26/11   brain  . Hx of radiation therapy 10/03/12 - 10/09/12   RUL lung  . Hypertension   . Hypothyroid   . Low back pain   . Lung cancer ([place]) 05/09/2011   RUL  . Lung cancer, upper lobe ([place]) 09/02/2012   rul  . Seizure disorder ([place])   . Shingles   . Sinus tachycardia   . Stroke [facility])    "they said ~ 2016; they don't really know when I had it" (05/10/2016)   Social History   Social History  . Marital status: Married    Spouse name: [name]  . Number of children: 5  . Years of education: 12   Occupational History  . retired Quarry manager    Social History Main Topics  . Smoking status: Current Some Day Smoker    Packs/day: 0.50    Years: 55.00    Types: Cigarettes    Last attempt to quit: 11/22/2014  . Smokeless tobacco: Never Used  . Alcohol use No  . Drug use: No  .  Sexual activity: Not Asked   Other Topics Concern  . None   Social History Narrative   She is married to [place] and had 5 sons.  She had a daughter who died at 74 months.  Her son, Heron Sabins, died at age 75 from alcoholism and pancreas disease.  Her son, Merry Proud, is 15 was born 42 and is alive and well.  Her son, Clair Gulling, is 29, born 80, and has bipolar disorder.  Her son, Jenny Reichmann, is 54, born 69, and has kidney disease.  Her son, Corene Cornea, is age 72, born [birth year], and is in the [place].    Right-handed.   2-4 cups caffeine per day.   Family History  Problem Relation Age of Onset  . Esophageal cancer Father   . Hypothyroidism Father   . Cancer Father     esophageal  . Congestive Heart Failure Father   . Heart disease Mother   . Hyperlipidemia Mother   . Hypertension Mother     heart disease  . Other Brother     low back pain  . Cancer Brother     esophageal  . Other  Brother     hypochondriasis  . Other Brother     suicide  . Esophageal cancer Brother   . Cervical cancer Sister   . Cancer Sister     cervical   Scheduled Meds: . aspirin EC  81 mg Oral Daily  . feeding supplement (ENSURE ENLIVE)  237 mL Oral BID BM  . FLUoxetine  10 mg Oral Daily  . furosemide  40 mg Intravenous Daily  . gabapentin  300 mg Oral QHS  . ipratropium-albuterol  3 mL Nebulization Q6H  . levETIRAcetam  500 mg Oral QHS  . levothyroxine  25 mcg Oral QAC breakfast  . methylPREDNISolone (SOLU-MEDROL) injection  60 mg Intravenous Daily  . mometasone-formoterol  2 puff Inhalation BID  . pantoprazole  40 mg Oral Daily  . potassium chloride  40 mEq Oral Daily  . rivaroxaban  10 mg Oral Daily  . sodium chloride flush  3 mL Intravenous Q12H  . spironolactone  12.5 mg Oral Daily   Continuous Infusions: PRN Meds:.acetaminophen **OR** acetaminophen, albuterol, ondansetron **OR** ondansetron (ZOFRAN) IV Allergies  Allergen Reactions  . Codeine Other (See Comments)    Elevated pulse   Review of Systems  Constitutional: Positive for activity change, appetite change, fatigue and unexpected weight change.  HENT: Negative for congestion, hearing loss, sinus pressure, sore throat and trouble swallowing.   Eyes: Negative for visual disturbance.  Respiratory: Positive for cough, chest tightness, shortness of breath and wheezing.   Cardiovascular: Negative for chest pain.  Gastrointestinal: Negative for abdominal distention, constipation, diarrhea, nausea and vomiting.  Musculoskeletal: Positive for gait problem.  Skin: Positive for pallor.  Neurological: Positive for tremors and weakness. Negative for dizziness and speech difficulty.  Hematological: Bruises/bleeds easily.  Psychiatric/Behavioral: Negative for confusion and sleep disturbance. The patient is nervous/anxious.    Physical Exam  Constitutional: She is oriented to person, place, and time. She appears cachectic. She  has a sickly appearance.  Frail woman lying in bed, appears older than stated age  HENT:  Mouth/Throat: Oropharynx is clear and moist. Abnormal dentition. No oropharyngeal exudate.  Eyes: EOM are normal.  Neck: Normal range of motion. Neck supple.  Cardiovascular: Regular rhythm.  Tachycardia present.   Pulmonary/Chest: Accessory muscle usage present. Tachypnea noted. She has decreased breath sounds in the right lower field and the left lower field.  She has wheezes. She has rhonchi.  Abdominal: Soft. Bowel sounds are normal.  Musculoskeletal: Normal range of motion.  Neurological: She is alert and oriented to person, place, and time.  Skin: Skin is warm and dry. There is pallor.  Psychiatric: She has a normal mood and affect. Her behavior is normal. Judgment and thought content normal.    Vital Signs: BP 106/78   Pulse (!) 110   Temp 97.8 F (36.6 C) (Axillary)   Resp 20   Ht '5\' 3"'  (1.6 m)   Wt 41 kg (90 lb 6.2 oz)   SpO2 94%   BMI 16.01 kg/m  Pain Assessment: No/denies pain     SpO2: SpO2: 94 % O2 Device:SpO2: 94 % O2 Flow Rate: .O2 Flow Rate (L/min): 3 L/min  IO: Intake/output summary:  Intake/Output Summary (Last 24 hours) at 10/23/16 1048 Last data filed at 10/23/16 0815  Gross per 24 hour  Intake             1290 ml  Output              825 ml  Net              465 ml    LBM:   Baseline Weight: Weight: 41 kg (90 lb 6.2 oz) Most recent weight: Weight: 41 kg (90 lb 6.2 oz)     Palliative Assessment/Data: PPS 30%    Time Total: 70 minutes Greater than 50%  of this time was spent counseling and coordinating care related to the above assessment and plan.  Signed by: Charlynn Court, NP Palliative Medicine Team Pager # [phone] (M-F 7a-5p) Team Phone # [phone] (Nights/Weekends)

## 2016-10-23 NOTE — Progress Notes (Addendum)
TEAM 1 - Stepdown/ICU TEAM  Janice Daniels  OEV:[id number] DOB: 07/30/1945 DOA: 10/22/2016 PCP: Donnajean Lopes, MD    Brief Narrative:  71 y.o. female with a history of  lung CA >9yr past chemo/rad, HTN, hypothyroidism, COPD on home O2 recently, L: hip fx in Dec 28299 and Systolic CHF EBZ16-96%who presented with worsening dyspnea, and weaknes.  Subjective: The patient is resting comfortably in her bed at the time of my visit.  She states that her breathing is much better though not quite back to her baseline.  She denies chest pain nausea vomiting or abdominal pain.  Assessment & Plan:  Acute on chronic hypoxic respiratory failure Multifactorial from COPD, pulm edema, generalized severe weakness appears to be improving/stabilizing - follow   COPD exacerbation Cont usual medical tx for acute exacerbation - follow   Acute on chronic systolic and diastolic CHF TTE Nov '17 noted EF 278-93% grade 1 diastolic dysfunction - not on ACEi due to low BP - not on BB due to severe COPD - wgt was 55-57kg as of Nov 2017 - appears volume depleted on exam presently   [facility]   10/22/16 2143  Weight: 41 kg (90 lb 6.2 oz)    Hyponatremia  likely related to hypovolemia - has improved w/ + fluid balance - follow trend   Urinary retention Required I/O cath last night to retrieve 825cc retained urine - follow   Hypothyroidism  TSH markedly elevated at 17 - suspect she has not been consistently getting her home synthroid - given level of elevation however will increase dose to 533m and resume   History of seizures continue Keppra  Small Cell Lung cancer Diagnosed 2012 - records note hx of XRT and systemic chemotherapy, prophylactic cranial irradiation, and stereotactic radiotherapy - office visit August 2017 noted CT scan showed no evidence for disease progression, w/ evolving radiation changes and masslike consolidation in the right upper lobe and posterior right  upper lobe  Failure to thrive Patient is losing weight, completely immobile, depressed - states she "can't walk any more" after her hip, failed rehabilitation, husband wouldn't pay $25 co-pay for PT at home - Palliative Care consulted - pt has refused SNF placement, though indications are that she is not getting adequate care at home   DVT prophylaxis: Xarelto  Code Status: FULL CODE Family Communication: no family present at time of exam  Disposition Plan: SDU  Consultants:  none  Procedures: none  Antimicrobials:  none   Objective: Blood pressure 114/70, pulse (!) 109, temperature 98 F (36.7 C), temperature source Axillary, resp. rate (!) 24, height '5\' 3"'$  (1.6 m), weight 41 kg (90 lb 6.2 oz), SpO2 99 %.  Intake/Output Summary (Last 24 hours) at 10/23/16 1347 Last data filed at 10/23/16 0815  Gross per 24 hour  Intake             1290 ml  Output              825 ml  Net              465 ml   Filed Weights   10/22/16 2143  Weight: 41 kg (90 lb 6.2 oz)    Examination: General: No acute respiratory distress at rest  Lungs: wheezing diffusely but mild w/ good air movement th/o - diffuse crackles  Cardiovascular: tachycardic but regular - no appreciable M or rub  Abdomen: Nontender, nondistended, soft, bowel sounds positive, no rebound, no ascites, no appreciable mass  Extremities: No significant cyanosis, clubbing, or edema bilateral lower extremities  CBC:  Recent Labs Lab 10/22/16 1726 10/23/16 0240  WBC 7.0 3.3*  NEUTROABS 5.5  --   HGB 12.4 11.9*  HCT 37.0 35.0*  MCV 89.4 90.4  PLT 235 846   Basic Metabolic Panel:  Recent Labs Lab 10/22/16 1726 10/23/16 0240  NA 124* 133*  K 3.9 4.1  CL 88* 96*  CO2 25 28  GLUCOSE 108* 133*  BUN <5* <5*  CREATININE 0.77 0.74  CALCIUM 8.7* 8.7*   GFR: Estimated Creatinine Clearance: 41.7 mL/min (by C-G formula based on SCr of 0.74 mg/dL).  Liver Function Tests:  Recent Labs Lab 10/22/16 1726  AST 20    ALT 5*  ALKPHOS 67  BILITOT 0.8  PROT 5.9*  ALBUMIN 3.4*    HbA1C: Hgb A1c MFr Bld  Date/Time Value Ref Range Status  10/27/2014 03:09 AM 6.0 (H) 4.8 - 5.6 % Final    Comment:    (NOTE)         Pre-diabetes: 5.7 - 6.4         Diabetes: >6.4         Glycemic control for adults with diabetes: <7.0      Recent Results (from the past 240 hour(s))  MRSA PCR Screening     Status: None   Collection Time: 10/23/16  5:10 AM  Result Value Ref Range Status   MRSA by PCR NEGATIVE NEGATIVE Final    Comment:        The GeneXpert MRSA Assay (FDA approved for NASAL specimens only), is one component of a comprehensive MRSA colonization surveillance program. It is not intended to diagnose MRSA infection nor to guide or monitor treatment for MRSA infections.      Scheduled Meds: . aspirin EC  81 mg Oral Daily  . feeding supplement (ENSURE ENLIVE)  237 mL Oral BID BM  . FLUoxetine  10 mg Oral Daily  . furosemide  40 mg Intravenous Daily  . gabapentin  300 mg Oral QHS  . ipratropium-albuterol  3 mL Nebulization Q6H  . levETIRAcetam  500 mg Oral QHS  . levothyroxine  25 mcg Oral QAC breakfast  . methylPREDNISolone (SOLU-MEDROL) injection  60 mg Intravenous Daily  . mometasone-formoterol  2 puff Inhalation BID  . pantoprazole  40 mg Oral Daily  . potassium chloride  40 mEq Oral Daily  . rivaroxaban  10 mg Oral Daily  . sodium chloride flush  3 mL Intravenous Q12H  . spironolactone  12.5 mg Oral Daily     LOS: 1 day   Cherene Altes, MD [facility] Office  [phone] Pager - Text Page per Amion as per below:  On-Call/Text Page:      [email or web address]      password [email or web address]  If 7PM-7AM, please contact night-coverage [email or web address] Password [email or web address] 10/23/2016, 1:47 PM

## 2016-10-23 NOTE — Care Management Note (Addendum)
Case Management Note  Patient Details  Name: Janice Daniels MRN: [id number] Date of Birth: 29-Sep-1945  Subjective/Objective:  From home with spouse, bed bound, presents with hx of lung ca greater than 5 years , htn, copd on home oxygen, l hip fx in 12/17 , CHF, now with worsening dyspnea and weakness.  PT rec SNF, CSW aware.  Patient refusing SNF.      5/1 [name] RN, BSN - NCM spoke with patient about [facility] services, she has [employer] and will have a co pay of around 25.00 for each service, so she states she would just want [place] and HHPT.  NCM offered choice for [facility], she chose [facility], referral made to [place] with [facility] for HHRN/HHPT.  Patient has a rolling walker at home a w/chair and a hospital bed.  She states he has home oxygen as well but can not remember which agency she has oxygen with.  NCM tried to contact the husband but was not able to speak with him.  NCM will cont to follow for dc needs.             Action/Plan: NCM will cont to follow for dc needs.  Expected Discharge Date:  10/27/16               Expected Discharge Plan:  Skilled Nursing Facility  In-House Referral:  Clinical Social Work  Discharge planning Services  CM Consult  Post Acute Care Choice:    Choice offered to:     DME Arranged:    DME Agency:     HH Arranged:    [place] Agency:     Status of Service:  Completed, signed off  If discussed at [employer], dates discussed:    Additional Comments:  [name], RN 10/23/2016, 8:31 PM

## 2016-10-23 NOTE — Progress Notes (Signed)
Initial Nutrition Assessment  DOCUMENTATION CODES:   Severe malnutrition in context of chronic illness, Underweight  INTERVENTION:    Ensure Enlive po TID, each supplement provides 350 kcal and 20 grams of protein  NUTRITION DIAGNOSIS:   Malnutrition (severe) related to chronic illness (COPD, CHF) as evidenced by moderate depletion of body fat, severe depletion of muscle mass, percent weight loss (28% weight loss in 5 months).  GOAL:   Patient will meet greater than or equal to 90% of their needs  MONITOR:   PO intake, Supplement acceptance, I & O's, Labs  REASON FOR ASSESSMENT:   Malnutrition Screening Tool    ASSESSMENT:   71 y.o. female with a past medical history significant for lung CA >49yr past chemo/rad, HTN, hypothyroidism, COPD on home O2 recently, CHF, who presents with few days worsening dyspnea, and weakness.  Patient reports recent poor intake. She is unsure why she has been eating poorly. She endorses ~35 lb weight loss within the past 5 months. Son in room provided no nutrition hx. Patient is willing to try Ensure supplements to maximize oral intake. Patient with 28% weight loss within the past 5 months.  Nutrition-Focused physical exam completed. Findings are mild-moderate fat depletion, severe muscle depletion, and no edema.  Patient with severe PCM. Labs and medications reviewed. Palliative Care consult has been ordered.  Diet Order:  Diet Heart Room service appropriate? Yes; Fluid consistency: Thin; Fluid restriction: 1200 mL Fluid  Skin:  Reviewed, no issues  Last BM:  unknown  Height:   Ht Readings from Last 1 Encounters:  10/22/16 '5\' 3"'$  (1.6 m)    Weight:   Wt Readings from Last 1 Encounters:  10/22/16 90 lb 6.2 oz (41 kg)    Ideal Body Weight:  52.3 kg  BMI:  Body mass index is 16.01 kg/m.  Estimated Nutritional Needs:   Kcal:  1300-1500  Protein:  65-75 gm  Fluid:  1.5 L  EDUCATION NEEDS:   No education needs identified  at this time  KMolli Barrows RAlgood LNorth Catasauqua CLas OchentaPager [phone] Hours Pager [phone]
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
## 2016-10-24 LAB — CBC
HCT: 32.5 % — ABNORMAL LOW (ref 36.0–46.0)
HEMOGLOBIN: 11.2 g/dL — AB (ref 12.0–15.0)
MCH: 31.3 pg (ref 26.0–34.0)
MCHC: 34.5 g/dL (ref 30.0–36.0)
MCV: 90.8 fL (ref 78.0–100.0)
PLATELETS: 193 10*3/uL (ref 150–400)
RBC: 3.58 MIL/uL — AB (ref 3.87–5.11)
RDW: 15.4 % (ref 11.5–15.5)
WBC: 4.6 10*3/uL (ref 4.0–10.5)

## 2016-10-24 LAB — COMPREHENSIVE METABOLIC PANEL
ALT: 6 U/L — AB (ref 14–54)
ANION GAP: 6 (ref 5–15)
AST: 19 U/L (ref 15–41)
Albumin: 3.2 g/dL — ABNORMAL LOW (ref 3.5–5.0)
Alkaline Phosphatase: 57 U/L (ref 38–126)
BUN: 7 mg/dL (ref 6–20)
CHLORIDE: 93 mmol/L — AB (ref 101–111)
CO2: 31 mmol/L (ref 22–32)
CREATININE: 0.73 mg/dL (ref 0.44–1.00)
Calcium: 8.8 mg/dL — ABNORMAL LOW (ref 8.9–10.3)
GFR calc Af Amer: >60 mL/min (ref >60)
Glucose, Bld: 106 mg/dL — ABNORMAL HIGH (ref 65–99)
Potassium: 4.1 mmol/L (ref 3.5–5.1)
SODIUM: 130 mmol/L — AB (ref 135–145)
Total Bilirubin: 0.6 mg/dL (ref 0.3–1.2)
Total Protein: 5.5 g/dL — ABNORMAL LOW (ref 6.5–8.1)

## 2016-10-24 MED ORDER — METHYLPREDNISOLONE SODIUM SUCC 125 MG IJ SOLR
60.0000 mg | Freq: Three times a day (TID) | INTRAMUSCULAR | Status: DC
  Administered 2016-10-24 – 2016-10-26 (×6): 60 mg via INTRAVENOUS
  Filled 2016-10-24 (×6): qty 2

## 2016-10-24 MED ORDER — LEVOFLOXACIN 500 MG PO TABS
500.0000 mg | ORAL_TABLET | Freq: Every day | ORAL | Status: DC
  Administered 2016-10-24 – 2016-10-26 (×3): 500 mg via ORAL
  Filled 2016-10-24 (×5): qty 1

## 2016-10-24 MED ORDER — BUDESONIDE 0.25 MG/2ML IN SUSP
0.2500 mg | Freq: Two times a day (BID) | RESPIRATORY_TRACT | Status: DC
  Administered 2016-10-24 – 2016-10-27 (×6): 0.25 mg via RESPIRATORY_TRACT
  Filled 2016-10-24 (×6): qty 2

## 2016-10-24 NOTE — Progress Notes (Signed)
[place] TEAM 1 - Stepdown/ICU TEAM  Janice Daniels  WUJ:[id number] DOB: 22-May-1946 DOA: 10/22/2016 PCP: Donnajean Lopes, MD    Brief Narrative:  71 y.o. female with a history of  lung CA >29yr past chemo/rad, HTN, hypothyroidism, COPD on home O2 recently, L hip fx in Dec 29562 and Systolic CHF EZH08-65%who presented with worsening dyspnea and weaknes.  Subjective: The patient states she feels a little bit worse today and complains of ongoing significant dyspnea.  She is definitely wheezing more today than when I saw her yesterday evening.  She denies chest pain abdominal pain nausea vomiting.  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  TTE Nov '17 noted EF 278-46% grade 1 diastolic dysfunction - not on ACEi due to low BP - not on BB due to severe COPD - wgt was 55-57kg as of Nov 2017 - appears volume depleted on exam presently   [facility]   10/22/16 2143 10/24/16 0625  Weight: 41 kg (90 lb 6.2 oz) 45.2 kg (99 lb 10.4 oz)    Hyponatremia  likely related to hypovolemia - has improved w/ + fluid balance - follow trend   Recent Labs Lab 10/22/16 1726 10/23/16 0240 10/24/16 0317  NA 124* 133* 130*    Urinary retention Required I/O cath night of 4/29>4/30 to retrieve 825cc retained urine - multiple episodes of urinary output documented since   Hypothyroidism  TSH markedly elevated at 17 - suspect she has not been consistently getting her home synthroid - given level of elevation however will increase dose to 532m and resume   History of seizures continue Keppra  Small Cell Lung cancer Diagnosed 2012 - records note hx of XRT and systemic chemotherapy, prophylactic  cranial irradiation, and stereotactic radiotherapy - office visit August 2017 noted CT scan showed no evidence for disease progression, w/ evolving radiation changes and masslike consolidation in the right upper lobe and posterior right upper lobe  Failure to thrive Patient is losing weight, completely immobile, depressed - states she "can't walk any more" after her hip repair and failed rehabilitation, husband wouldn't pay $25 co-pay for PT at home - Palliative Care consulted - pt has refused SNF placement, though indications are that she is not getting adequate care at home   Severe malnutrition in context of chronic illness, Underweight  DVT prophylaxis: Xarelto  Code Status: FULL CODE Family Communication: no family present at time of exam  Disposition Plan: SDU  Consultants:  none  Procedures: none  Antimicrobials:  Levaquin 5/41 >  Objective: Blood pressure 102/66, pulse (!) 105, temperature 97.6 F (36.4 C), temperature source Oral, resp. rate (!) 21, height '5\' 3"'$  (1.6 m), weight 45.2 kg (99 lb 10.4 oz), SpO2 98 %.  Intake/Output Summary (Last 24 hours) at 10/24/16 1322 Last data filed at 10/24/16 1200  Gross per 24 hour  Intake              680 ml  Output                0 ml  Net              680 ml   Filed Weights   10/22/16 2143 10/24/16 0625  Weight: 41  kg (90 lb 6.2 oz) 45.2 kg (99 lb 10.4 oz)    Examination: General: mild resp distress - able to complete full sentences but must pause between to catch breath  Lungs: wheezing diffusely w/ diminished air movement diffusely  Cardiovascular: tachycardic but regular - no M or rub  Abdomen: Nontender, nondistended, soft, bowel sounds positive, no rebound Extremities: No significant edema bilateral lower extremities  CBC:  Recent Labs Lab 10/22/16 1726 10/23/16 0240 10/24/16 0317  WBC 7.0 3.3* 4.6  NEUTROABS 5.5  --   --   HGB 12.4 11.9* 11.2*  HCT 37.0 35.0* 32.5*  MCV 89.4 90.4 90.8  PLT 235 205 170    Basic Metabolic Panel:  Recent Labs Lab 10/22/16 1726 10/23/16 0240 10/24/16 0317  NA 124* 133* 130*  K 3.9 4.1 4.1  CL 88* 96* 93*  CO2 '25 28 31  '$ GLUCOSE 108* 133* 106*  BUN <5* <5* 7  CREATININE 0.77 0.74 0.73  CALCIUM 8.7* 8.7* 8.8*   GFR: Estimated Creatinine Clearance: 46 mL/min (by C-G formula based on SCr of 0.73 mg/dL).  Liver Function Tests:  Recent Labs Lab 10/22/16 1726 10/24/16 0317  AST 20 19  ALT 5* 6*  ALKPHOS 67 57  BILITOT 0.8 0.6  PROT 5.9* 5.5*  ALBUMIN 3.4* 3.2*    HbA1C: Hgb A1c MFr Bld  Date/Time Value Ref Range Status  10/27/2014 03:09 AM 6.0 (H) 4.8 - 5.6 % Final    Comment:    (NOTE)         Pre-diabetes: 5.7 - 6.4         Diabetes: >6.4         Glycemic control for adults with diabetes: <7.0      Recent Results (from the past 240 hour(s))  Blood culture (routine x 2)     Status: None (Preliminary result)   Collection Time: 10/22/16  5:26 PM  Result Value Ref Range Status   Specimen Description BLOOD LEFT FOREARM  Final   Special Requests   Final    BOTTLES DRAWN AEROBIC AND ANAEROBIC Blood Culture adequate volume   Culture NO GROWTH 2 DAYS  Final   Report Status PENDING  Incomplete  Blood culture (routine x 2)     Status: None (Preliminary result)   Collection Time: 10/22/16  5:47 PM  Result Value Ref Range Status   Specimen Description BLOOD RIGHT ANTECUBITAL  Final   Special Requests   Final    BOTTLES DRAWN AEROBIC AND ANAEROBIC Blood Culture adequate volume   Culture NO GROWTH 2 DAYS  Final   Report Status PENDING  Incomplete  MRSA PCR Screening     Status: None   Collection Time: 10/23/16  5:10 AM  Result Value Ref Range Status   MRSA by PCR NEGATIVE NEGATIVE Final    Comment:        The GeneXpert MRSA Assay (FDA approved for NASAL specimens only), is one component of a comprehensive MRSA colonization surveillance program. It is not intended to diagnose MRSA infection nor to guide or monitor treatment  for MRSA infections.      Scheduled Meds: . aspirin EC  81 mg Oral Daily  . feeding supplement (ENSURE ENLIVE)  237 mL Oral TID BM  . FLUoxetine  10 mg Oral Daily  . gabapentin  300 mg Oral QHS  . ipratropium-albuterol  3 mL Nebulization QID  . levETIRAcetam  500 mg Oral QHS  . levothyroxine  50 mcg Oral QAC breakfast  .  methylPREDNISolone (SOLU-MEDROL) injection  60 mg Intravenous Daily  . mometasone-formoterol  2 puff Inhalation BID  . pantoprazole  40 mg Oral Daily  . potassium chloride  40 mEq Oral Daily  . rivaroxaban  10 mg Oral Daily  . sodium chloride flush  3 mL Intravenous Q12H  . spironolactone  12.5 mg Oral Daily     LOS: 2 days   Cherene Altes, MD [facility] Office  [phone] Pager - Text Page per Shea Evans as per below:  On-Call/Text Page:      [email or web address]      password [email or web address]  If 7PM-7AM, please contact night-coverage [email or web address] Password [email or web address] 10/24/2016, 1:22 PM

## 2016-10-24 NOTE — Progress Notes (Signed)
Daily Progress Note   Patient Name: Janice Daniels       Date: 10/24/2016 DOB: 1946-05-12  Age: 71 y.o. MRN#: [id number] Attending Physician: Cherene Altes, MD Primary Care Physician: Donnajean Lopes, MD Admit Date: 10/22/2016  Reason for Consultation/Follow-up: Disposition, Establishing goals of care and Psychosocial/spiritual support  Subjective: Janice Daniels feels relatively unchanged from yesterday. She continues to endorse significant shortness of breath with wheezing, as well as generalized lethargy and weakness. She had a good visit with her son yesterday, though hasn't had a chance to talk with her husband again since we met yesterday.   Length of Stay: 2  Current Medications: Scheduled Meds:  . aspirin EC  81 mg Oral Daily  . feeding supplement (ENSURE ENLIVE)  237 mL Oral TID BM  . FLUoxetine  10 mg Oral Daily  . gabapentin  300 mg Oral QHS  . ipratropium-albuterol  3 mL Nebulization QID  . levETIRAcetam  500 mg Oral QHS  . levothyroxine  50 mcg Oral QAC breakfast  . methylPREDNISolone (SOLU-MEDROL) injection  60 mg Intravenous Daily  . mometasone-formoterol  2 puff Inhalation BID  . pantoprazole  40 mg Oral Daily  . potassium chloride  40 mEq Oral Daily  . rivaroxaban  10 mg Oral Daily  . sodium chloride flush  3 mL Intravenous Q12H  . spironolactone  12.5 mg Oral Daily    Continuous Infusions:  PRN Meds: acetaminophen **OR** acetaminophen, albuterol, ondansetron **OR** ondansetron (ZOFRAN) IV  Physical Exam  Constitutional: She is oriented to person, place, and time. She appears cachectic. She has a sickly appearance.  Frail woman lying in bed, appears older than stated age  HENT:  Mouth/Throat: Oropharynx is clear and moist. Abnormal dentition. No oropharyngeal exudate.  Eyes: EOM are normal.  Neck:  Normal range of motion. Neck supple.  Cardiovascular: Regular rhythm.  Tachycardia present.   Pulmonary/Chest: Accessory muscle usage present. Tachypnea noted. She has decreased breath sounds in the right lower field and the left lower field. She has wheezes. She has rhonchi.  Abdominal: Soft. Bowel sounds are normal.  Musculoskeletal: Normal range of motion.  Neurological: She is alert and oriented to person, place, and time.  Significant hand tremor. Skin: Skin is warm and dry. There is pallor.  Psychiatric: Judgment and thought content normal    Flat affect and difficult to engage. Slow to respond and withdrawn.       Vital Signs: BP 102/66 (BP Location: Right Leg)   Pulse (!) 105   Temp 97.6 F (36.4 C) (Oral)   Resp (!) 21   Ht _0  (1.6 m)   Wt 45.2 kg (99 lb 10.4 oz)   SpO2 98%   BMI 17.65 kg/m  SpO2: SpO2: 98 % O2 Device: O2 Device: Nasal Cannula O2 Flow Rate: O2 Flow Rate (L/min): 3 L/min  Intake/output summary:  Intake/Output Summary (Last 24 hours) at 10/24/16 1329 Last data filed at 10/24/16 1200  Gross per 24 hour  Intake              680 ml  Output                0 ml  Net  680 ml   LBM: Last BM Date: 10/23/16 Baseline Weight: Weight: 41 kg (90 lb 6.2 oz) Most recent weight: Weight: 45.2 kg (99 lb 10.4 oz)  Palliative Assessment/Data: PPS 30%     Patient Active Problem List   Diagnosis Date Noted  . Goals of care, counseling/discussion   . Palliative care by specialist   . COPD with acute exacerbation ([place]) 10/22/2016  . Sinus tachycardia 05/22/2016  . Postoperative anemia due to acute blood loss   . AKI (acute kidney injury) ([place])   . Hyponatremia 05/10/2016  . 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 Partial seizure ([place]) 11/10/2015  . Constipation 05/18/2015  . Chronic systolic heart failure ([place]) 11/06/2014  . Acute on chronic systolic CHF (congestive heart failure) ([place])   . HCAP (healthcare-associated pneumonia)   . Acute renal  failure syndrome ([place])   . Other specified hypothyroidism   . Cancer of upper lobe of right lung ([place])   . Tobacco abuse   . Acute on chronic respiratory failure with hypoxia ([place]) 10/26/2014  . Lung cancer ([place]) 05/09/2011  . THROMBOCYTOSIS 08/29/2010  . GERD 08/12/2010  . OSTEOPENIA 03/16/2009  . COLONIC POLYPS 03/11/2009  . HYPOKALEMIA 07/07/2008  . DEPRESSION 03/15/2007  . Essential hypertension 03/15/2007  . COPD (chronic obstructive pulmonary disease) with emphysema ([place]) 03/15/2007  . OSTEOARTHRITIS, FINGERS 03/15/2007    Palliative Care Assessment & Plan   HPI: 71 y.o. female  with past medical history of COPD on home O2, CHF (EF 20-25%), lung Ca >52yr past treatment, HTN, hypothyroidism, depression, and recent hip fracture (S/p ORIF. IM nail on 05/19/2016). Since her hip fracture she has been bed-bound and is now entirely reliant on her husband for care. She now presents with weakness and shortness of breath, and was admitted on 10/22/2016 with COPD exacerbation, CHF exacerbation, and hyponatremia. Palliative consulted to assist in goals of care given her failure to thrive with weight loss, immobility, and depression.   Assessment: Please see my initial consult note on 4/30 for full details of my long conversation with Janice Daniels. In brief, she has a good grasp on her health issues and is cognizant that she is declining and likely doesn't have much time left. That said, she still wants to pursue interventions that would enable her to live longer and intends to continue to utilize the hospital when she feels poorly. She feels she derives greater benefit than burden from hospitalizations.    Today, I had another long conversation with Janice Daniels about her thoughts surrounding her future. Given her insight that she likely doesn't have long left, we talked about the different options moving forward and what each option might entail. I centered the conversation around where she  would want to spend her time, as well as what these places could likely provide. We talked about being at rehab then home with home health versus hospice, going directly home with home health versus hospice, and going to a residential hospice facility.  At this point, she wants to go directly home with as much home health as insurance will cover. I brought up the reality of services ending once her benefit stopped, at which point she plans to return to the hospital if needed. She is adamant that she does not need or want Hospice services yet, though did not want to talk about why this is. I queried her strong feelings about Hospice, explaining their care philosophy and how it aligns with her goal of being at home. She felt  too much had been discussed and she did not want to talk about it further today.  Unfortunately, I have not been able to schedule a time to sit down with her and her husband to facilitate a family meeting. He intends to be a the hospital at some point in the next 24-48 hours, but does not want to schedule a time to meet and does not want me to call him during the day to check-in about a time.   Recommendations/Plan:  Full code, continue full scope care  Pt refusing discharge to SNF at this point, she plans to return home with home health services  I will plan to meet with Mrs. Mondesir again tomorrow. She is hopeful to talk with her husband today and wanted to touch base about their conversation tomorrow.   Goals of Care and Additional Recommendations:  Limitations on Scope of Treatment: Full Scope Treatment  Code Status:  Full code  Prognosis:   < 6 months; pt with marked weight loss and increased debility in the setting of CHF and COPD.   Discharge Planning:  Home with [place] was discussed with pt.  Thank you for allowing the Palliative Medicine Team to assist in the care of this patient.  Time in: 1310 Time out: 1400 Total  time: 65 minutes    Greater than 50%  of this time was spent counseling and coordinating care related to the above assessment and plan.  Charlynn Court, NP Palliative Medicine Team [phone] pager (7a-5p) Team Phone # [phone]

## 2016-10-24 NOTE — Consult Note (Signed)
   [facility] CM Inpatient Consult   10/24/2016  DEMETRA MOYA 02-10-1946 [id number]  On 10/23/16:  Inpatient social worker requested information regarding patient's previous involvement with [facility] Care Management.  This Probation officer reached out to the [employer].   Patient had has a previous HX with [place] Management with the [place] [facility] Social Worker.  Both described having difficulty maintaining contact with the patient through her husband. Chart review reveals multiple social issues noted per inpatient social workers notes.  Noted also discussions with palliative care with patient and husband.  Met with patient at the bedside. Patient was very quiet in her response regarding restart of [place] Management.  She states, "I just don't know right now."  Patient did accept the brochure with [place] Management contact information. Made inpatient RNCM, Neoma Laming aware of HX with [facility]. Will follow for progress and disposition needs. Patient has a [place] Management consent on file.  For questions, please contact:   Natividad Brood, RN BSN [facility] Liaison  [phone] business mobile phone Toll free office [phone]

## 2016-10-25 ENCOUNTER — Telehealth (INDEPENDENT_AMBULATORY_CARE_PROVIDER_SITE_OTHER): Payer: Self-pay | Admitting: Orthopaedic Surgery

## 2016-10-25 NOTE — Care Management Note (Addendum)
Case Management Note  Patient Details  Name: Janice Daniels MRN: [id number] Date of Birth: 1945-08-12  Subjective/Objective:     From home with spouse, bed bound, presents with hx of lung ca greater than 5 years , htn, copd on home oxygen, l hip fx in 12/17 , CHF, now with worsening dyspnea and weakness.  PT rec SNF, CSW aware.  Patient refusing SNF.      5/1 [name] RN, BSN - NCM spoke with patient about [facility] services, she has [employer] and will have a co pay of around 25.00 for each service, so she states she would just want [place] and HHPT.  NCM offered choice for [facility], she chose [facility], referral made to [place] with [facility] for HHRN/HHPT.  Patient has a rolling walker at home a w/chair and a hospital bed.  She states he has home oxygen as well but can not remember which agency she has oxygen with.  NCM tried to contact the husband but was not able to speak with him.  NCM will cont to follow for dc needs.    5/2 [place], BSN - NCM received referral for Hospice per CSW from Palliative Care.  NCM offered choice to patient, she chose [facility], referral made to Amy with [facility].  She will need transport home by ptar tomorrow address has been confirmed with patient, she is on oxygen.  Will put ambulance forms on shadow chart.     5/3 1211 [place], BSN -Per MD wants to give patient one more day of IV steroids, plan for dc tomorrow via PTAR.    5/4 1104 [name] RN, BSN - Patient is for dc today, home with hospice, transport via PTAR is for 2 pm.  Ambulance forms on the chart,                          Action/Plan: NCM will cont to follow for dc needs.  Expected Discharge Date:  10/27/16               Expected Discharge Plan:  Home w Hospice Care  In-House Referral:  Clinical Social Work  Discharge planning Services  CM Consult  Post Acute Care Choice:  Hospice Choice offered to:  Patient  DME Arranged:    DME Agency:     HH Arranged:   RN [place] Agency:   [facility]  Status of Service:  Completed, signed off  If discussed at [employer], dates discussed:    Additional Comments:  [name], RN 10/25/2016, 12:21 PM

## 2016-10-25 NOTE — Telephone Encounter (Signed)
Jennifer from [facility] called and was wanting to know if the patient needed to continue talking xarelto. She is going to be discharged from the hospital tomorrow and Anderson [place] wanted to know if the medication was still necessary.  CB # [phone]

## 2016-10-25 NOTE — Telephone Encounter (Signed)
Please advise 

## 2016-10-25 NOTE — Progress Notes (Signed)
Physical Therapy Treatment Patient Details Name: Janice Daniels MRN: [id number] DOB: 06-17-1946 Today's Date: 10/25/2016    History of Present Illness Janice Daniels is a 71 y.o. female with a past medical history significant for lung CA >77yr past chemo/rad, HTN, hypothyroidism, COPD on home O2 recently, CHF EF20-30% who presents with few days worsening dyspnea, and weakness. Left Hip fx s/p sx 12/17, went to rehab, but has essentially been bed bound since then (relies on husband for everything). Current dx: Acute on chronic hypoxic respiratory failure    PT Comments    Pt required mod/max +2 for mobility this session. Practiced lateral transfer bed to chair as pt does as w/c at home. Pt continues to report that she "never gets OOB" when at home. Pt with flat affect and limited communication this session as well as decreased participation in mobility. Continue to recommend ST-SNF for further skilled PT services to maximize independence and safety with mobility.     Follow Up Recommendations  SNF;Supervision/Assistance - 24 hour     Equipment Recommendations  None recommended by PT    Recommendations for Other Services Other (comment)     Precautions / Restrictions Precautions Precautions: Fall    Mobility  Bed Mobility Overal bed mobility: Needs Assistance Bed Mobility: Supine to Sit;Rolling Rolling: Min assist   Supine to sit: +2 for safety/equipment;HOB elevated;Mod assist     General bed mobility comments: multimodal cues to initiate task; assist to elevate trunk into sitting and scoot hips toward EOB with use of bed pad  Transfers Overall transfer level: Needs assistance   Transfers: Lateral/Scoot Transfers          Lateral/Scoot Transfers: Max assist;+2 physical assistance General transfer comment: verbal and tactile cues for hand placement, anterior translation of trunk, and sequencing; assist to scoot with use of bed pad and to maintain balance in  sitting as pt tends to lean posteriorly  Ambulation/Gait                 Stairs            Wheelchair Mobility    Modified Rankin (Stroke Patients Only)       Balance Overall balance assessment: Needs assistance Sitting-balance support: Bilateral upper extremity supported;Feet supported Sitting balance-Leahy Scale: Poor   Postural control: Posterior lean;Right lateral lean                                  Cognition Arousal/Alertness: Awake/alert Behavior During Therapy: Flat affect Overall Cognitive Status: Within Functional Limits for tasks assessed                                 General Comments: not very communicative this session      Exercises      General Comments General comments (skin integrity, edema, etc.): pt reported wearing "diapers" at home; pt encouraged to attempt personal hygiene when therapist and tech assisting to clean pt of urine and pt reported "i can't" however with encouragement to do so pt is able       Pertinent Vitals/Pain Pain Assessment: No/denies pain    Home Living                      Prior Function            PT Goals (current goals can  now be found in the care plan section) Progress towards PT goals: Progressing toward goals    Frequency    Min 3X/week      PT Plan Current plan remains appropriate    Co-evaluation              AM-PAC PT "6 Clicks" Daily Activity  Outcome Measure  Difficulty turning over in bed (including adjusting bedclothes, sheets and blankets)?: Total Difficulty moving from lying on back to sitting on the side of the bed? : Total Difficulty sitting down on and standing up from a chair with arms (e.g., wheelchair, bedside commode, etc,.)?: Total Help needed moving to and from a bed to chair (including a wheelchair)?: A Lot Help needed walking in hospital room?: Total Help needed climbing 3-5 steps with a railing? : Total 6 Click Score:  7    End of Session   Activity Tolerance: Patient tolerated treatment well Patient left: in chair;with call bell/phone within Daniels;with chair alarm set;with family/visitor present Nurse Communication: Mobility status PT Visit Diagnosis: Muscle weakness (generalized) (M62.81);Difficulty in walking, not elsewhere classified (R26.2)     Time: 0370-4888 PT Time Calculation (min) (ACUTE ONLY): 25 min  Charges:  $Therapeutic Activity: 23-37 mins                    G Codes:       Earney Navy, PTA Pager: [phone]     Darliss Cheney 10/25/2016, 12:07 PM

## 2016-10-25 NOTE — Progress Notes (Signed)
[facility] TEAM 1 - Stepdown/ICU TEAM  Janice Daniels  TOI:[id number] DOB: 1946/01/10 DOA: 10/22/2016 PCP: Donnajean Lopes, MD    Brief Narrative:  71 y.o. female with a history of  lung CA >55yr past chemo/rad, HTN, hypothyroidism, COPD on home O2 recently, L hip fx in Dec [address]338 and Systolic CHF ESN05-39%who presented with worsening dyspnea and weaknes.  Subjective: Patient states she feels the same in term of breathing today. Not much improvement than yesterday. She continues to refuse SNF as she think they push her too much and she wont be able to participate. I explained her the reason for SNF and risk/benefit but she continues to refuse. She spoke with Palliative care and changed her code status to DNR. She would like to go home on home hospice once stable.   Assessment & Plan:  Acute on chronic hypoxic respiratory failure Multifactorial from COPD + generalized severe weakness - waxing and waning - follow in SDU w/ ongoing tx  Palliative care consulted- medical optimize and then home with hospice support.   COPD exacerbation Cont Nebulizer- Albuterol and Ipratropium  On solumedrol '60mg'$  q8 hrs.  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  TTE Nov '17 noted EF 276-73% grade 1 diastolic dysfunction - not on ACEi due to low BP - not on BB due to severe COPD - wgt was 55-57kg as of Nov 2017 - appears volume depleted on exam presently   [facility]   10/22/16 2143 10/24/16 0625 10/25/16 0413  Weight: 41 kg (90 lb 6.2 oz) 45.2 kg (99 lb 10.4 oz) 46.2 kg (101 lb 13.6 oz)    Hyponatremia  likely related to hypovolemia - has improved w/ + fluid balance - follow trend   Recent Labs Lab 10/22/16 1726 10/23/16 0240 10/24/16 0317  NA 124* 133* 130*    Urinary retention Required I/O cath night of 4/29>4/30 to retrieve 825cc retained urine - multiple episodes of urinary output documented since   Hypothyroidism  TSH markedly elevated at 17 -  suspect she has not been consistently getting her home synthroid - given level of elevation however will increase dose to 523m and resume   History of seizures continue Keppra  Small Cell Lung cancer Diagnosed 2012 - records note hx of XRT and systemic chemotherapy, prophylactic cranial irradiation, and stereotactic radiotherapy - office visit August 2017 noted CT scan showed no evidence for disease progression, w/ evolving radiation changes and masslike consolidation in the right upper lobe and posterior right upper lobe  Failure to thrive Patient is losing weight, completely immobile, depressed - states she "can't walk any more" after her hip repair and failed rehabilitation, husband wouldn't pay $25 co-pay for PT at home - Palliative Care consulted - pt has refused SNF placement, though indications are that she is not getting adequate care at home   Severe malnutrition in context of chronic illness, Underweight  Due to some concerns of home safety, case was [place] to [place] worker [place] been assigned to the case.  Patient is no DNR. Form signed in the chart.   DVT prophylaxis: Xarelto  Code Status: DNR Family Communication: no family present at time of exam  Disposition Plan: SDU  Consultants:  none  Procedures: none  Antimicrobials:  Levaquin 5/41 >  Objective: Blood pressure 120/66, pulse (!) 106, temperature 98.3 F (36.8 C), temperature source Oral, resp. rate (!) 24, height '5\' 3"'$  (1.6 m), weight 46.2 kg (101 lb 13.6  oz), SpO2 99 %.  Intake/Output Summary (Last 24 hours) at 10/25/16 1324 Last data filed at 10/24/16 1859  Gross per 24 hour  Intake              440 ml  Output                0 ml  Net              440 ml   Filed Weights   10/22/16 2143 10/24/16 0625 10/25/16 0413  Weight: 41 kg (90 lb 6.2 oz) 45.2 kg (99 lb 10.4 oz) 46.2 kg (101 lb 13.6 oz)    Examination: General: mild resp distress - able to complete full sentences but  must pause between to catch breath  Lungs: wheezing diffusely w/ diminished air movement diffusely  Cardiovascular: tachycardic but regular - no M or rub  Abdomen: Nontender, nondistended, soft, bowel sounds positive, no rebound Extremities: No significant edema bilateral lower extremities  CBC:  Recent Labs Lab 10/22/16 1726 10/23/16 0240 10/24/16 0317  WBC 7.0 3.3* 4.6  NEUTROABS 5.5  --   --   HGB 12.4 11.9* 11.2*  HCT 37.0 35.0* 32.5*  MCV 89.4 90.4 90.8  PLT 235 205 829   Basic Metabolic Panel:  Recent Labs Lab 10/22/16 1726 10/23/16 0240 10/24/16 0317  NA 124* 133* 130*  K 3.9 4.1 4.1  CL 88* 96* 93*  CO2 '25 28 31  '$ GLUCOSE 108* 133* 106*  BUN <5* <5* 7  CREATININE 0.77 0.74 0.73  CALCIUM 8.7* 8.7* 8.8*   GFR: Estimated Creatinine Clearance: 47 mL/min (by C-G formula based on SCr of 0.73 mg/dL).  Liver Function Tests:  Recent Labs Lab 10/22/16 1726 10/24/16 0317  AST 20 19  ALT 5* 6*  ALKPHOS 67 57  BILITOT 0.8 0.6  PROT 5.9* 5.5*  ALBUMIN 3.4* 3.2*    HbA1C: Hgb A1c MFr Bld  Date/Time Value Ref Range Status  10/27/2014 03:09 AM 6.0 (H) 4.8 - 5.6 % Final    Comment:    (NOTE)         Pre-diabetes: 5.7 - 6.4         Diabetes: >6.4         Glycemic control for adults with diabetes: <7.0      Recent Results (from the past 240 hour(s))  Blood culture (routine x 2)     Status: None (Preliminary result)   Collection Time: 10/22/16  5:26 PM  Result Value Ref Range Status   Specimen Description BLOOD LEFT FOREARM  Final   Special Requests   Final    BOTTLES DRAWN AEROBIC AND ANAEROBIC Blood Culture adequate volume   Culture NO GROWTH 3 DAYS  Final   Report Status PENDING  Incomplete  Blood culture (routine x 2)     Status: None (Preliminary result)   Collection Time: 10/22/16  5:47 PM  Result Value Ref Range Status   Specimen Description BLOOD RIGHT ANTECUBITAL  Final   Special Requests   Final    BOTTLES DRAWN AEROBIC AND ANAEROBIC Blood  Culture adequate volume   Culture NO GROWTH 3 DAYS  Final   Report Status PENDING  Incomplete  MRSA PCR Screening     Status: None   Collection Time: 10/23/16  5:10 AM  Result Value Ref Range Status   MRSA by PCR NEGATIVE NEGATIVE Final    Comment:        The GeneXpert MRSA Assay (FDA approved for NASAL specimens  only), is one component of a comprehensive MRSA colonization surveillance program. It is not intended to diagnose MRSA infection nor to guide or monitor treatment for MRSA infections.      Scheduled Meds: . aspirin EC  81 mg Oral Daily  . budesonide (PULMICORT) nebulizer solution  0.25 mg Nebulization BID  . feeding supplement (ENSURE ENLIVE)  237 mL Oral TID BM  . FLUoxetine  10 mg Oral Daily  . gabapentin  300 mg Oral QHS  . ipratropium-albuterol  3 mL Nebulization QID  . levETIRAcetam  500 mg Oral QHS  . levofloxacin  500 mg Oral Q1500  . levothyroxine  50 mcg Oral QAC breakfast  . methylPREDNISolone (SOLU-MEDROL) injection  60 mg Intravenous Q8H  . pantoprazole  40 mg Oral Daily  . potassium chloride  40 mEq Oral Daily  . rivaroxaban  10 mg Oral Daily  . sodium chloride flush  3 mL Intravenous Q12H     LOS: 3 days   Gerlean Ren, MD [facility] Office  [phone] Pager - Text Page per Shea Evans as per below:  On-Call/Text Page:      [email or web address]      password [email or web address]  If 7PM-7AM, please contact night-coverage [email or web address] Password [email or web address] 10/25/2016, 1:24 PM

## 2016-10-25 NOTE — Telephone Encounter (Signed)
Called back and advised

## 2016-10-25 NOTE — Progress Notes (Addendum)
Daily Progress Note   Patient Name: Janice Daniels       Date: 10/25/2016 DOB: 1945-11-15  Age: 71 y.o. MRN#: [id number] Attending Physician: Janice Lack, MD Primary Care Physician: Janice Lopes, MD Admit Date: 10/22/2016  Reason for Consultation/Follow-up: Disposition, Establishing goals of care and Psychosocial/spiritual support  Subjective: Janice Daniels is more withdrawn today and reports no significant changes from yesterday. She remains short of breath, weak, and tired. Her husband is now at her bedside. He feels she looks unchanged from admission.   Length of Stay: 3  Current Medications: Scheduled Meds:  . aspirin EC  81 mg Oral Daily  . budesonide (PULMICORT) nebulizer solution  0.25 mg Nebulization BID  . feeding supplement (ENSURE Daniels)  237 mL Oral TID BM  . FLUoxetine  10 mg Oral Daily  . gabapentin  300 mg Oral QHS  . ipratropium-albuterol  3 mL Nebulization QID  . levETIRAcetam  500 mg Oral QHS  . levofloxacin  500 mg Oral Q1500  . levothyroxine  50 mcg Oral QAC breakfast  . methylPREDNISolone (SOLU-MEDROL) injection  60 mg Intravenous Q8H  . pantoprazole  40 mg Oral Daily  . potassium chloride  40 mEq Oral Daily  . rivaroxaban  10 mg Oral Daily  . sodium chloride flush  3 mL Intravenous Q12H    Continuous Infusions:  PRN Meds: acetaminophen **OR** acetaminophen, albuterol, ondansetron **OR** ondansetron (ZOFRAN) IV  Physical Exam  Constitutional: She is oriented to person, place, and time. She appears cachectic. She has a sickly appearance.  Frail woman sitting in bedside chair, appears older than stated age  HENT:  Mouth/Throat: Oropharynx is clear and moist. Abnormal dentition. No oropharyngeal exudate.  Eyes: EOM are normal.  Neck: Normal range of motion. Neck supple.  Cardiovascular:  Regular rhythm.  Tachycardia present.   Pulmonary/Chest: Accessory muscle usage present. Tachypnea noted. She has decreased breath sounds in the right lower field and the left lower field. She has wheezes. She has rhonchi.  Abdominal: Soft. Bowel sounds are normal.  Musculoskeletal: Normal range of motion.  Neurological: She is alert and oriented to person, place, and time.  Significant hand tremor. Skin: Skin is warm and dry. There is pallor.  Psychiatric: Judgment and thought content normal    Flat affect and difficult to engage. Slow to respond and withdrawn.       Vital Signs: BP 119/71 (BP Location: Right Arm)   Pulse (!) 107   Temp 97.1 F (36.2 C) (Oral)   Resp (!) 24   Ht '5\' 3"'$  (1.6 m)   Wt 46.2 kg (101 lb 13.6 oz)   SpO2 99%   BMI 18.04 kg/m  SpO2: SpO2: 99 % O2 Device: O2 Device: Nasal Cannula O2 Flow Rate: O2 Flow Rate (L/min): 3 L/min  Intake/output summary:   Intake/Output Summary (Last 24 hours) at 10/25/16 1259 Last data filed at 10/24/16 1859  Gross per 24 hour  Intake              440 ml  Output                0 ml  Net  440 ml   LBM: Last BM Date: 10/23/16 Baseline Weight: Weight: 41 kg (90 lb 6.2 oz) Most recent weight: Weight: 46.2 kg (101 lb 13.6 oz)  Palliative Assessment/Data: PPS 30%   Flowsheet Rows     Most Recent Value  Intake Tab  Referral Department  Hospitalist  Unit at Time of Referral  Intermediate Care Unit  Palliative Care Primary Diagnosis  Cancer  Date Notified  10/22/16  Palliative Care Type  New Palliative care  Reason for referral  Clarify Goals of Care  Date of Admission  10/22/16  Date first seen by Palliative Care  10/23/16  # of days Palliative referral response time  1 Day(s)  # of days IP prior to Palliative referral  0  Clinical Assessment  Psychosocial & Spiritual Assessment  Palliative Care Outcomes      Patient Active Problem List   Diagnosis Date Noted  . Goals of care, counseling/discussion     . Palliative care by specialist   . COPD with acute exacerbation ([place]) 10/22/2016  . Sinus tachycardia 05/22/2016  . Postoperative anemia due to acute blood loss   . AKI (acute kidney injury) ([place])   . Hyponatremia 05/10/2016  . 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 Partial seizure ([place]) 11/10/2015  . Constipation 05/18/2015  . Chronic systolic heart failure ([place]) 11/06/2014  . Acute on chronic systolic CHF (congestive heart failure) ([place])   . HCAP (healthcare-associated pneumonia)   . Acute renal failure syndrome ([place])   . Other specified hypothyroidism   . Cancer of upper lobe of right lung ([place])   . Tobacco abuse   . Acute on chronic respiratory failure with hypoxia ([place]) 10/26/2014  . Lung cancer ([place]) 05/09/2011  . THROMBOCYTOSIS 08/29/2010  . GERD 08/12/2010  . OSTEOPENIA 03/16/2009  . COLONIC POLYPS 03/11/2009  . HYPOKALEMIA 07/07/2008  . DEPRESSION 03/15/2007  . Essential hypertension 03/15/2007  . COPD (chronic obstructive pulmonary disease) with emphysema ([place]) 03/15/2007  . OSTEOARTHRITIS, FINGERS 03/15/2007    Palliative Care Assessment & Plan   HPI: 70 y.o. female  with past medical history of COPD on home O2, CHF (EF 20-25%), lung Ca >26yr past treatment, HTN, hypothyroidism, depression, and recent hip fracture (S/p ORIF. IM nail on 05/19/2016). Since her hip fracture she has been bed-bound and is now entirely reliant on her husband for care. She now presents with weakness and shortness of breath, and was admitted on 10/22/2016 with COPD exacerbation, CHF exacerbation, and hyponatremia. Palliative consulted to assist in goals of care given her failure to thrive with weight loss, immobility, and depression.   Assessment: I have had multiple conversation with Janice Daniels over the past two days, and in these conversations she consistently endorsed recognizing her time was limited and desiring to remain at home for the time she had left. She did, however, need her  husband to be present before she could make any decisions about changing her care plan.  Today, I was able to meet with her and her husband. He verbalized his fear that her body was becoming progressively weaker and she was approaching the end of her life. She was tearful and agreed with this sentiment. In discussing the options moving forward, which I had talked about with her yesterday, she reiterated her desire to be at home and stay there. I again brought up Hospice and explained the role they could play at supporting her at home, and enabling her to remain there, and she was now open to their support.  Her husband noted that he had heard of Hospice before and felt they could probably provide enough support that he felt comfortable with her being at home (he was initially advocating for SNF placement, which Mrs. Alaimo was strongly opposed to). He did want to discuss the financial side of Hospice care, which I explained could be best discussed by the Hospice group directly.  Finally, I did bring up code status again in light of Mrs. Bontempo's desire to focus on being comfortable at home. After talking through what resuscitative measures entailed, and the likely outcome of these measures in the context of her overall health, she felt DNR status was most appropriate.   Recommendations/Plan:  DNR  Plan to medically optimize then discharge home with Hospice support  CM consulted to assist in Hospice referal  Code Status:  DNR  Prognosis:   < 6 months; pt with marked weight loss and increased debility in the setting of CHF and COPD.   Discharge Planning:  Home with Hospice  Care plan was discussed with pt, her husband, care nurse, and SW.  Thank you for allowing the Palliative Medicine Team to assist in the care of this patient.  Total time: 35 minutes    Greater than 50%  of this time was spent counseling and coordinating care related to the above assessment and plan.  Charlynn Court, NP Palliative Medicine Team [phone] pager (7a-5p) Team Phone # [phone]

## 2016-10-25 NOTE — Telephone Encounter (Signed)
Not from my stand point but is she supposed to be on it for another reason?

## 2016-10-25 NOTE — Progress Notes (Addendum)
[facility] Liaison:  RN visit  Notified by Neoma Laming [facility], of patient/family request for Hospice and [place] services at home after discharge.  Chart and patient information are being reviewed with Dr. Brigid Re, [place] Director.  Hospice eligibility is pending.  Writer spoke with patient at bedside to initiate education related to hospice philosophy, services and team approach to care.  No family at bedside.  I did try to contact husband and son with no answer.  Patient voiced understanding of information provided and confirmed that she does want home with hospice.  Per discussion, patient is to discharge home 10/26/16 by PTAR.  DME needs discussed and patient states she currently has a hospital bed, wheelchair, walker, nebulizer and O2 concentrator in the home.  Patient declined any DME need at this time.  [facility] Referral Center aware of the above.  Completed discharge summary will need to be faxed to [facility] at [phone] when final.  Please notify [facility] when patient is ready to leave unit at discharge --call [phone] (or [phone] after 5 pm).  [facility] information and contact numbers were given to patient during visit.  Above information shared with Neoma Laming, [facility].  Please feel free to call with any hospice related questions.  Thank you for the referral,  Edyth Gunnels, RN, [facility] Liaison [phone]  All hospital liaison's are now on [place].    **UPDATE:  Patient is hospice eligible.  Spoke with Neoma Laming, [facility] to advise of same.**

## 2016-10-25 NOTE — Clinical Social Work Note (Signed)
[facility] called and stated that the patient's case has been assigned to a Education officer, museum for evaluation. CSW updated APS on disposition plan.  Janice Daniels, [place]

## 2016-10-25 NOTE — Telephone Encounter (Signed)
States no other reason . Will D/C FYI

## 2016-10-26 MED ORDER — ENOXAPARIN SODIUM 40 MG/0.4ML ~~LOC~~ SOLN
40.0000 mg | SUBCUTANEOUS | Status: DC
  Administered 2016-10-26: 40 mg via SUBCUTANEOUS
  Filled 2016-10-26: qty 0.4

## 2016-10-26 MED ORDER — ALBUTEROL SULFATE (2.5 MG/3ML) 0.083% IN NEBU
2.5000 mg | INHALATION_SOLUTION | RESPIRATORY_TRACT | Status: DC | PRN
  Administered 2016-10-27: 2.5 mg via RESPIRATORY_TRACT
  Filled 2016-10-26: qty 3

## 2016-10-26 MED ORDER — IPRATROPIUM-ALBUTEROL 0.5-2.5 (3) MG/3ML IN SOLN
3.0000 mL | Freq: Three times a day (TID) | RESPIRATORY_TRACT | Status: DC
  Administered 2016-10-26 – 2016-10-27 (×3): 3 mL via RESPIRATORY_TRACT
  Filled 2016-10-26 (×3): qty 3

## 2016-10-26 MED ORDER — PREDNISONE 20 MG PO TABS
40.0000 mg | ORAL_TABLET | Freq: Every day | ORAL | Status: DC
  Administered 2016-10-27: 40 mg via ORAL
  Filled 2016-10-26: qty 2

## 2016-10-26 NOTE — Care Management Important Message (Signed)
Important Message  Patient Details  Name: Janice Daniels MRN: [id number] Date of Birth: [birth year]   Medicare Important Message Given:  Yes    Diezel Mazur Abena 10/26/2016, 11:08 AM

## 2016-10-26 NOTE — Progress Notes (Signed)
[facility] Liaison  Per conversation with Charlynn Court, NP and Neoma Laming, [facility], patient will not be going home today.  She will be going home tomorrow.   Thank you,  Edyth Gunnels, RN, [facility] Liaison [phone]

## 2016-10-26 NOTE — Progress Notes (Signed)
[facility] TEAM 1 - Stepdown/ICU TEAM  Janice Daniels  ZYS:[id number] DOB: 1946-06-21 DOA: 10/22/2016 PCP: Donnajean Lopes, MD    Brief Narrative:  71 y.o. female with a history of  lung CA >77yr past chemo/rad, HTN, hypothyroidism, COPD on home O2 recently, L hip fx in Dec [address]323 and Systolic CHF EFT73-22%who presented with worsening dyspnea and weaknes.  Subjective: Patient states she feels the same in term of breathing today. Not much improvement than yesterday. She continues to refuse SNF as she think they push her too much and she wont be able to participate. I explained her the reason for SNF and risk/benefit but she continues to refuse. She spoke with Palliative care and changed her code status to DNR. She would like to go home on home hospice once stable.   Assessment & Plan:  Acute on chronic hypoxic respiratory failure Multifactorial from COPD + generalized severe weakness - waxing and waning - follow in SDU w/ ongoing tx  Palliative care consulted- medical optimize and then home with hospice support.   COPD exacerbation Cont Nebulizer- Albuterol and Ipratropium  On solumedrol '60mg'$  q8 hrs. Change to PO Prednisone '40mg'$  daily for 5 days. Will give prolong taper.  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  TTE Nov '17 noted EF 202-54% grade 1 diastolic dysfunction - not on ACEi due to low BP - not on BB due to severe COPD - wgt was 55-57kg as of Nov 2017 - appears volume depleted on exam presently   [facility] Inc[facility]ights   10/24/16 0625 10/25/16 0413 10/26/16 0413  Weight: 45.2 kg (99 lb 10.4 oz) 46.2 kg (101 lb 13.6 oz) 47.2 kg (104 lb 0.9 oz)    Hyponatremia  likely related to hypovolemia - has improved w/ + fluid balance - follow trend   Recent Labs Lab 10/22/16 1726 10/23/16 0240 10/24/16 0317  NA 124* 133* 130*    Urinary retention Required I/O cath night of 4/29>4/30 to retrieve 825cc retained urine - multiple episodes of urinary  output documented since   Hypothyroidism  TSH markedly elevated at 17 - suspect she has not been consistently getting her home synthroid - given level of elevation however will increase dose to 576m and resume   History of seizures continue Keppra  Small Cell Lung cancer Diagnosed 2012 - records note hx of XRT and systemic chemotherapy, prophylactic cranial irradiation, and stereotactic radiotherapy - office visit August 2017 noted CT scan showed no evidence for disease progression, w/ evolving radiation changes and masslike consolidation in the right upper lobe and posterior right upper lobe  Failure to thrive Patient is losing weight, completely immobile, depressed - states she "can't walk any more" after her hip repair and failed rehabilitation, husband wouldn't pay $25 co-pay for PT at home - Palliative Care consulted - pt has refused SNF placement, though indications are that she is not getting adequate care at home   Severe malnutrition in context of chronic illness, Underweight  Due to some concerns of home safety, case was reported to [place] worker has been assigned to the case.  Patient is no DNR. Form signed in the chart.   Apparently patient was placed on for DVT prophylaxis after hip surgery several months ago but somehow was continued. At this time there is no indication for the patient to be on Xarelto therefore I will switch her over to Lovenox. Refer to EMR for further documentation.    DVT prophylaxis:  Lovenox Code Status: DNR Family Communication: no family present at time of exam  Disposition Plan: Likely discharge tomorrow.   Consultants:  none  Procedures: none  Antimicrobials:  Levaquin 5/41 >  Objective: Blood pressure 102/79, pulse (!) 109, temperature 98.2 F (36.8 C), temperature source Oral, resp. rate 15, height '5\' 3"'$  (1.6 m), weight 47.2 kg (104 lb 0.9 oz), SpO2 98 %.  Intake/Output Summary (Last 24 hours) at 10/26/16  1224 Last data filed at 10/26/16 1000  Gross per 24 hour  Intake             2470 ml  Output                0 ml  Net             2470 ml   Filed Weights   10/24/16 0625 10/25/16 0413 10/26/16 0413  Weight: 45.2 kg (99 lb 10.4 oz) 46.2 kg (101 lb 13.6 oz) 47.2 kg (104 lb 0.9 oz)    Examination: General: mild resp distress - able to complete full sentences but must pause between to catch breath  Lungs: wheezing diffusely w/ diminished air movement diffusely  Cardiovascular: tachycardic but regular - no M or rub  Abdomen: Nontender, nondistended, soft, bowel sounds positive, no rebound Extremities: No significant edema bilateral lower extremities  CBC:  Recent Labs Lab 10/22/16 1726 10/23/16 0240 10/24/16 0317  WBC 7.0 3.3* 4.6  NEUTROABS 5.5  --   --   HGB 12.4 11.9* 11.2*  HCT 37.0 35.0* 32.5*  MCV 89.4 90.4 90.8  PLT 235 205 660   Basic Metabolic Panel:  Recent Labs Lab 10/22/16 1726 10/23/16 0240 10/24/16 0317  NA 124* 133* 130*  K 3.9 4.1 4.1  CL 88* 96* 93*  CO2 '25 28 31  '$ GLUCOSE 108* 133* 106*  BUN <5* <5* 7  CREATININE 0.77 0.74 0.73  CALCIUM 8.7* 8.7* 8.8*   GFR: Estimated Creatinine Clearance: 48.1 mL/min (by C-G formula based on SCr of 0.73 mg/dL).  Liver Function Tests:  Recent Labs Lab 10/22/16 1726 10/24/16 0317  AST 20 19  ALT 5* 6*  ALKPHOS 67 57  BILITOT 0.8 0.6  PROT 5.9* 5.5*  ALBUMIN 3.4* 3.2*    HbA1C: Hgb A1c MFr Bld  Date/Time Value Ref Range Status  10/27/2014 03:09 AM 6.0 (H) 4.8 - 5.6 % Final    Comment:    (NOTE)         Pre-diabetes: 5.7 - 6.4         Diabetes: >6.4         Glycemic control for adults with diabetes: <7.0      Recent Results (from the past 240 hour(s))  Blood culture (routine x 2)     Status: None (Preliminary result)   Collection Time: 10/22/16  5:26 PM  Result Value Ref Range Status   Specimen Description BLOOD LEFT FOREARM  Final   Special Requests   Final    BOTTLES DRAWN AEROBIC AND  ANAEROBIC Blood Culture adequate volume   Culture NO GROWTH 4 DAYS  Final   Report Status PENDING  Incomplete  Blood culture (routine x 2)     Status: None (Preliminary result)   Collection Time: 10/22/16  5:47 PM  Result Value Ref Range Status   Specimen Description BLOOD RIGHT ANTECUBITAL  Final   Special Requests   Final    BOTTLES DRAWN AEROBIC AND ANAEROBIC Blood Culture adequate volume   Culture NO GROWTH 4 DAYS  Final   Report Status PENDING  Incomplete  MRSA PCR Screening     Status: None   Collection Time: 10/23/16  5:10 AM  Result Value Ref Range Status   MRSA by PCR NEGATIVE NEGATIVE Final    Comment:        The GeneXpert MRSA Assay (FDA approved for NASAL specimens only), is one component of a comprehensive MRSA colonization surveillance program. It is not intended to diagnose MRSA infection nor to guide or monitor treatment for MRSA infections.      Scheduled Meds: . aspirin EC  81 mg Oral Daily  . budesonide (PULMICORT) nebulizer solution  0.25 mg Nebulization BID  . feeding supplement (ENSURE ENLIVE)  237 mL Oral TID BM  . FLUoxetine  10 mg Oral Daily  . gabapentin  300 mg Oral QHS  . ipratropium-albuterol  3 mL Nebulization QID  . levETIRAcetam  500 mg Oral QHS  . levofloxacin  500 mg Oral Q1500  . levothyroxine  50 mcg Oral QAC breakfast  . pantoprazole  40 mg Oral Daily  . potassium chloride  40 mEq Oral Daily  . [START ON 10/27/2016] predniSONE  40 mg Oral Q breakfast  . sodium chloride flush  3 mL Intravenous Q12H     LOS: 4 days   Gerlean Ren, MD [facility] Office  [phone] Pager - Text Page per Shea Evans as per below:  On-Call/Text Page:      [email or web address]      password [email or web address]  If 7PM-7AM, please contact night-coverage [email or web address] Password [facility] 10/26/2016, 12:24 PM

## 2016-10-26 NOTE — Progress Notes (Signed)
Daily Progress Note   Patient Name: Janice Daniels       Date: 10/26/2016 DOB: January 20, 1946  Age: 71 y.o. MRN#: [id number] Attending Physician: Damita Lack, MD Primary Care Physician: Donnajean Lopes, MD Admit Date: 10/22/2016  Reason for Consultation/Follow-up: Disposition and Psychosocial/spiritual support  Subjective: Janice Daniels is feeling better this morning and feels she is close to her baseline status. She is ready to return home, and continues to desire Hospice support after discharge.   Length of Stay: 4  Current Medications: Scheduled Meds:  . aspirin EC  81 mg Oral Daily  . budesonide (PULMICORT) nebulizer solution  0.25 mg Nebulization BID  . feeding supplement (ENSURE ENLIVE)  237 mL Oral TID BM  . FLUoxetine  10 mg Oral Daily  . gabapentin  300 mg Oral QHS  . ipratropium-albuterol  3 mL Nebulization QID  . levETIRAcetam  500 mg Oral QHS  . levofloxacin  500 mg Oral Q1500  . levothyroxine  50 mcg Oral QAC breakfast  . methylPREDNISolone (SOLU-MEDROL) injection  60 mg Intravenous Q8H  . pantoprazole  40 mg Oral Daily  . potassium chloride  40 mEq Oral Daily  . rivaroxaban  10 mg Oral Daily  . sodium chloride flush  3 mL Intravenous Q12H    Continuous Infusions:  PRN Meds: acetaminophen **OR** acetaminophen, albuterol, ondansetron **OR** ondansetron (ZOFRAN) IV  Physical Exam  Constitutional: She is oriented to person, place, and time. She appears cachectic. She has a sickly appearance.  Frail woman lying in bed, appears older than stated age  HENT:  Mouth/Throat: Oropharynx is clear and moist. Abnormal dentition. No oropharyngeal exudate.  Eyes: EOM are normal.  Neck: Normal range of motion. Neck supple.  Cardiovascular: Regular rhythm.  Tachycardia present.   Pulmonary/Chest: Accessory muscle  usage present. Tachypnea noted. She has decreased breath sounds in the right lower field and the left lower field. She has wheezes.  Abdominal: Soft. Bowel sounds are normal.  Musculoskeletal: Normal range of motion.  Neurological: She is alert and oriented to person, place, and time.  Significant hand tremor. Skin: Skin is warm and dry. There is pallor.  Slow to respond and withdrawn.       Vital Signs: BP 101/67 (BP Location: Left Arm)   Pulse (!) 104   Temp 97.8 F (36.6 C) (Oral)   Resp (!) 22   Ht '5\' 3"'  (1.6 m)   Wt 47.2 kg (104 lb 0.9 oz)   SpO2 96%   BMI 18.43 kg/m  SpO2: SpO2: 96 % O2 Device: O2 Device: Nasal Cannula O2 Flow Rate: O2 Flow Rate (L/min): 3 L/min  Intake/output summary:   Intake/Output Summary (Last 24 hours) at 10/26/16 1122 Last data filed at 10/26/16 1000  Gross per 24 hour  Intake             2230 ml  Output                0 ml  Net             2230 ml   LBM: Last BM Date: 10/25/16 Baseline Weight: Weight: 41 kg (90 lb 6.2 oz) Most recent weight: Weight: 47.2 kg (104 lb  0.9 oz)  Palliative Assessment/Data: PPS 30%   Flowsheet Rows     Most Recent Value  Intake Tab  Referral Department  Hospitalist  Unit at Time of Referral  Intermediate Care Unit  Palliative Care Primary Diagnosis  Cancer  Date Notified  10/22/16  Palliative Care Type  New Palliative care  Reason for referral  Clarify Goals of Care  Date of Admission  10/22/16  Date first seen by Palliative Care  10/23/16  # of days Palliative referral response time  1 Day(s)  # of days IP prior to Palliative referral  0  Clinical Assessment  Psychosocial & Spiritual Assessment  Palliative Care Outcomes      Patient Active Problem List   Diagnosis Date Noted  . Goals of care, counseling/discussion   . Palliative care by specialist   . COPD with acute exacerbation ([place]) 10/22/2016  . Sinus tachycardia 05/22/2016  . Postoperative anemia due to acute blood loss   . AKI (acute  kidney injury) ([place])   . Hyponatremia 05/10/2016  . 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 Partial seizure ([place]) 11/10/2015  . Constipation 05/18/2015  . Chronic systolic heart failure ([place]) 11/06/2014  . Acute on chronic systolic CHF (congestive heart failure) ([place])   . HCAP (healthcare-associated pneumonia)   . Acute renal failure syndrome ([place])   . Other specified hypothyroidism   . Cancer of upper lobe of right lung ([place])   . Tobacco abuse   . Acute on chronic respiratory failure with hypoxia ([place]) 10/26/2014  . Lung cancer ([place]) 05/09/2011  . THROMBOCYTOSIS 08/29/2010  . GERD 08/12/2010  . OSTEOPENIA 03/16/2009  . COLONIC POLYPS 03/11/2009  . HYPOKALEMIA 07/07/2008  . DEPRESSION 03/15/2007  . Essential hypertension 03/15/2007  . COPD (chronic obstructive pulmonary disease) with emphysema ([place]) 03/15/2007  . OSTEOARTHRITIS, FINGERS 03/15/2007    Palliative Care Assessment & Plan   HPI: 71 y.o. female  with past medical history of COPD on home O2, CHF (EF 20-25%), lung Ca >20yr past treatment, HTN, hypothyroidism, depression, and recent hip fracture (S/p ORIF. IM nail on 05/19/2016). Since her hip fracture she has been bed-bound and is now entirely reliant on her husband for care. She now presents with weakness and shortness of breath, and was admitted on 10/22/2016 with COPD exacerbation, CHF exacerbation, and hyponatremia. Palliative consulted to assist in goals of care given her failure to thrive with weight loss, immobility, and depression.   Assessment: Janice Daniels is doing well today and eager for discharge home. We discussed the plan of transitioning her to a regular floor and observing for a day to ensure stability, then discharge. She was accepting of this plan. After multiple conversations with her and her husband on 5/2, they are both okay with her returning home with Hospice support. She is adamant about returning home (no SNF or residential hospice), but understands that  her care needs may change and she may require additional support in the future. [facility] did visit with her yesterday, and she had no questions or concerns when I met with her today.   Recommendations/Plan:  DNR  Plan to medically optimize then discharge home with Hospice support  *Clear goals of care and code status established. Palliative will follow peripherally at this point. Please call [phone] questions, concerns, or need to re-engage.   Code Status:  DNR  Prognosis:   < 6 months; pt with marked weight loss and increased debility in the setting of CHF and COPD.   Discharge Planning:  Home with Hospice  Care plan was discussed with pt, CM, and primary team.  Thank you for allowing the Palliative Medicine Team to assist in the care of this patient.  Total time: 15 minutes    Greater than 50%  of this time was spent counseling and coordinating care related to the above assessment and plan.  Charlynn Court, NP Palliative Medicine Team [phone] pager (7a-5p) Team Phone # [phone]

## 2016-10-27 LAB — CULTURE, BLOOD (ROUTINE X 2)
CULTURE: NO GROWTH
CULTURE: NO GROWTH
Special Requests: ADEQUATE
Special Requests: ADEQUATE

## 2016-10-27 MED ORDER — DM-GUAIFENESIN ER 30-600 MG PO TB12
1.0000 | ORAL_TABLET | Freq: Two times a day (BID) | ORAL | Status: DC
  Administered 2016-10-27: 1 via ORAL
  Filled 2016-10-27: qty 1

## 2016-10-27 MED ORDER — PREDNISONE 10 MG PO TABS: 10.0000 mg | ORAL_TABLET | ORAL | 0 refills | Status: AC

## 2016-10-27 MED ORDER — DM-GUAIFENESIN ER 30-600 MG PO TB12: 1.0000 | ORAL_TABLET | Freq: Two times a day (BID) | ORAL | 0 refills | Status: AC | PRN

## 2016-10-27 MED ORDER — PREDNISONE 20 MG PO TABS: 40.0000 mg | ORAL_TABLET | Freq: Every day | ORAL | 0 refills | Status: AC

## 2016-10-27 MED ORDER — LEVOFLOXACIN 500 MG PO TABS: 500.0000 mg | ORAL_TABLET | Freq: Every day | ORAL | 0 refills | Status: AC

## 2016-10-27 MED ORDER — BUDESONIDE 0.25 MG/2ML IN SUSP: 0.2500 mg | Freq: Two times a day (BID) | RESPIRATORY_TRACT | 12 refills | Status: AC

## 2016-10-27 NOTE — Progress Notes (Signed)
Occupational Therapy Treatment Patient Details Name: Janice Daniels MRN: [id number] DOB: 11-14-1945 Today's Date: 10/27/2016    History of present illness TIFFINIE CAILLIER is a 71 y.o. female with a past medical history significant for lung CA >4yr past chemo/rad, HTN, hypothyroidism, COPD on home O2 recently, CHF EF20-30% who presents with few days worsening dyspnea, and weakness. Left Hip fx s/p sx 12/17, went to rehab, but has essentially been bed bound since then (relies on husband for everything). Current dx: Acute on chronic hypoxic respiratory failure   OT comments  This 71yo female admitted with above not showing progress today (pt needing more A with all activities). She will continue to benefit from acute OT with follow up at next venue.  Follow Up Recommendations  SNF;Supervision/Assistance - 24 hour;Other (comment)    Equipment Recommendations  None recommended by OT       Precautions / Restrictions Precautions Precautions: Fall Restrictions Weight Bearing Restrictions: No       Mobility Bed Mobility Overal bed mobility: Needs Assistance Bed Mobility: Supine to Sit     Supine to sit: +2 for safety/equipment;HOB elevated;Max assist     General bed mobility comments: VCs for sequencing as well  Transfers Overall transfer level: Needs assistance Equipment used: 2 person hand held assist Transfers: Sit to/from SW. R. BerkleySit to Stand: Max assist;+2 physical assistance Stand pivot transfers: Total assist;+2 physical assistance            Balance Overall balance assessment: Needs assistance Sitting-balance support: Bilateral upper extremity supported;Feet supported Sitting balance-Leahy Scale: Poor Sitting balance - Comments: reliant on Bil UEs and pushing, right lateral lean Postural control: Posterior lean;Right lateral lean Standing balance support: Bilateral upper extremity supported Standing balance-Leahy Scale:  Zero Standing balance comment: unable to achieve full upright standing                           ADL either performed or assessed with clinical judgement   ADL Overall ADL's : Needs assistance/impaired     Grooming: Wash/dry face;Set up;Supervision/safety Grooming Details (indicate cue type and reason): Mod A for sitting balance at EOB (posteior lean/push)                 Toilet Transfer: +2 for physical assistance;Total assistance;Squat-pivot Toilet Transfer Details (indicate cue type and reason): bed>recliner going to pt's right Toileting- Clothing Manipulation and Hygiene: +2 for physical assistance;Total assistance Toileting - Clothing Manipulation Details (indicate cue type and reason): 1 person to A with standing and one person to clean                       Cognition Arousal/Alertness: Awake/alert Behavior During Therapy: Flat affect Overall Cognitive Status: No family/caregiver present to determine baseline cognitive functioning                                 General Comments: Pt with increased time and ability to follow through with requests for movements                   Pertinent Vitals/ Pain       Pain Assessment: Faces Faces Pain Scale: Hurts little more Pain Location: right foot Pain Descriptors / Indicators: Grimacing;Moaning Pain Intervention(s): Monitored during session;Repositioned         Frequency  Min 2X/week  Progress Toward Goals  OT Goals(current goals can now be found in the care plan section)  Progress towards OT goals: Not progressing toward goals - comment     Plan Discharge plan remains appropriate    Co-evaluation    PT/OT/SLP Co-Evaluation/Treatment: Yes Reason for Co-Treatment: For patient/therapist safety          AM-PAC PT "6 Clicks" Daily Activity     Outcome Measure   Help from another person eating meals?: A Lot Help from another person taking care of personal  grooming?: A Lot Help from another person toileting, which includes using toliet, bedpan, or urinal?: Total Help from another person bathing (including washing, rinsing, drying)?: Total Help from another person to put on and taking off regular upper body clothing?: Total Help from another person to put on and taking off regular lower body clothing?: Total 6 Click Score: 8    End of Session Equipment Utilized During Treatment: Gait belt  OT Visit Diagnosis: Unsteadiness on feet (R26.81);Muscle weakness (generalized) (M62.81);Adult, failure to thrive (R62.7)   Activity Tolerance Patient tolerated treatment well   Patient Left in chair;with call bell/phone within reach;with chair alarm set   Nurse Communication  (made front desk aware pt needed A for eating)        Time: 7782-4235 OT Time Calculation (min): 37 min  Charges: OT Evaluation $OT Eval Low Complexity: 1 Procedure OT Treatments $Self Care/Home Management : 8-22 mins  [address] [address], OTR/L [phone] 10/27/2016

## 2016-10-27 NOTE — Clinical Social Work Note (Signed)
CSW notified [place] worker, Ailene Ards [phone]) that patient is discharging home today.  CSW signing off.  Dayton Scrape, [place]

## 2016-10-27 NOTE — Consult Note (Signed)
   [facility] Inpatient Consult   10/27/2016  JANEICE STEGALL 1945/11/01 [id number]  Update:  Patient has chosen to DC home with Hospice.  She will get full care management services under their program. Will no longer follow for disposition needs with [facility] Care management. Spoke with inpatient Social Worker, Judson Roch Will sign off. For questions, please contact:  Natividad Brood, RN BSN [facility] Liaison  [phone] business mobile phone Toll free office [phone]
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
## 2016-10-27 NOTE — Progress Notes (Signed)
[facility] RN visit  Spoke with patient at bedside to follow-up with patient about possible discharge today - Patient up in recliner eating breakfast denying complaints of discomfort, stating "Just tired and ready to get back home".  Patient feels comfortable with Home with Hospice plan and reconfirmed that there are no DME needs at this time.  Bedside RN confirms plan to discharge today.  Please fax discharge summary to [phone] when complete and notify [facility] at [phone] when patient is ready to leave unit at discharge.  And please call with any Hospice related questions as needed.  Thank you, Gar Ponto, [facility] Liaison [phone]

## 2016-10-27 NOTE — Progress Notes (Signed)
Physical Therapy Treatment Patient Details Name: Janice Daniels MRN: [id number] DOB: April 13, 1946 Today's Date: 10/27/2016    History of Present Illness Janice Daniels is a 71 y.o. female with a past medical history significant for lung CA >56yr past chemo/rad, HTN, hypothyroidism, COPD on home O2 recently, CHF EF20-30% who presents with few days worsening dyspnea, and weakness. Left Hip fx s/p sx 12/17, went to rehab, but has essentially been bed bound since then (relies on husband for everything). Current dx: Acute on chronic hypoxic respiratory failure    PT Comments    Patient required more assistance this session for all mobility. Pt continues to be flat and not very communicative. Pt will benefit from further skilled PT services to maximize independence and safety with mobility.    Follow Up Recommendations  SNF;Supervision/Assistance - 24 hour     Equipment Recommendations  None recommended by PT    Recommendations for Other Services Other (comment)     Precautions / Restrictions Precautions Precautions: Fall Restrictions Weight Bearing Restrictions: No    Mobility  Bed Mobility Overal bed mobility: Needs Assistance Bed Mobility: Supine to Sit     Supine to sit: +2 for safety/equipment;HOB elevated;Max assist     General bed mobility comments: pt able to use bed rails to assist in elevating trunk and able to assist in bringing bilat LE to EOB; mutlimodal cues for initiation of task; bed pad used to bring hips toward EOB  Transfers Overall transfer level: Needs assistance Equipment used: Rolling walker (2 wheeled);2 person hand held assist Transfers: Sit to/from SOmnicareSit to Stand: Max assist;+2 physical assistance Stand pivot transfers: Total assist;+2 physical assistance       General transfer comment: cues for hand placement and sequencing; sit to stand with RW initially and pt reported pain in R foot and returned to sitting;  stand pivot performed with +2 assist with biLat LE blocked   Ambulation/Gait                 Stairs            Wheelchair Mobility    Modified Rankin (Stroke Patients Only)       Balance Overall balance assessment: Needs assistance Sitting-balance support: Bilateral upper extremity supported;Feet supported Sitting balance-Leahy Scale: Poor Sitting balance - Comments: reliant on Bil UEs and pushing, right lateral lean Postural control: Posterior lean;Right lateral lean Standing balance support: Bilateral upper extremity supported Standing balance-Leahy Scale: Zero Standing balance comment: unable to achieve full upright standing                            Cognition Arousal/Alertness: Awake/alert Behavior During Therapy: Flat affect Overall Cognitive Status: Within Functional Limits for tasks assessed                                 General Comments: Pt with increased time and ability to follow through with requests for movements      Exercises      General Comments        Pertinent Vitals/Pain Pain Assessment: Faces Faces Pain Scale: Hurts little more Pain Location: right foot Pain Descriptors / Indicators: Grimacing;Moaning Pain Intervention(s): Limited activity within patient's tolerance;Monitored during session;Repositioned    Home Living                      Prior  Function            PT Goals (current goals can now be found in the care plan section) Progress towards PT goals: Progressing toward goals    Frequency    Min 3X/week      PT Plan Current plan remains appropriate    Co-evaluation   Reason for Co-Treatment: For patient/therapist safety          AM-PAC PT "6 Clicks" Daily Activity  Outcome Measure  Difficulty turning over in bed (including adjusting bedclothes, sheets and blankets)?: Total Difficulty moving from lying on back to sitting on the side of the bed? : Total Difficulty  sitting down on and standing up from a chair with arms (e.g., wheelchair, bedside commode, etc,.)?: Total Help needed moving to and from a bed to chair (including a wheelchair)?: A Lot Help needed walking in hospital room?: Total Help needed climbing 3-5 steps with a railing? : Total 6 Click Score: 7    End of Session Equipment Utilized During Treatment: Gait belt;Oxygen Activity Tolerance: Patient tolerated treatment well Patient left: in chair;with call bell/phone within reach;with chair alarm set Nurse Communication: Mobility status PT Visit Diagnosis: Muscle weakness (generalized) (M62.81);Difficulty in walking, not elsewhere classified (R26.2)     Time: 4975-3005 PT Time Calculation (min) (ACUTE ONLY): 37 min  Charges:  $Therapeutic Activity: 8-22 mins                    G Codes:       Earney Navy, PTA Pager: [phone]     Darliss Cheney 10/27/2016, 11:01 AM

## 2016-12-24 DEATH — deceased

## 2017-01-11 IMAGING — DX DG HIP (WITH OR WITHOUT PELVIS) 2-3V*L*
3 series · 3 of 3 positions shown · non-contrast
Comparison: None.

CLINICAL DATA: Left hip pain after fall yesterday.

EXAM:
DG HIP (WITH OR WITHOUT PELVIS) 2-3V LEFT

[pelvis ap]
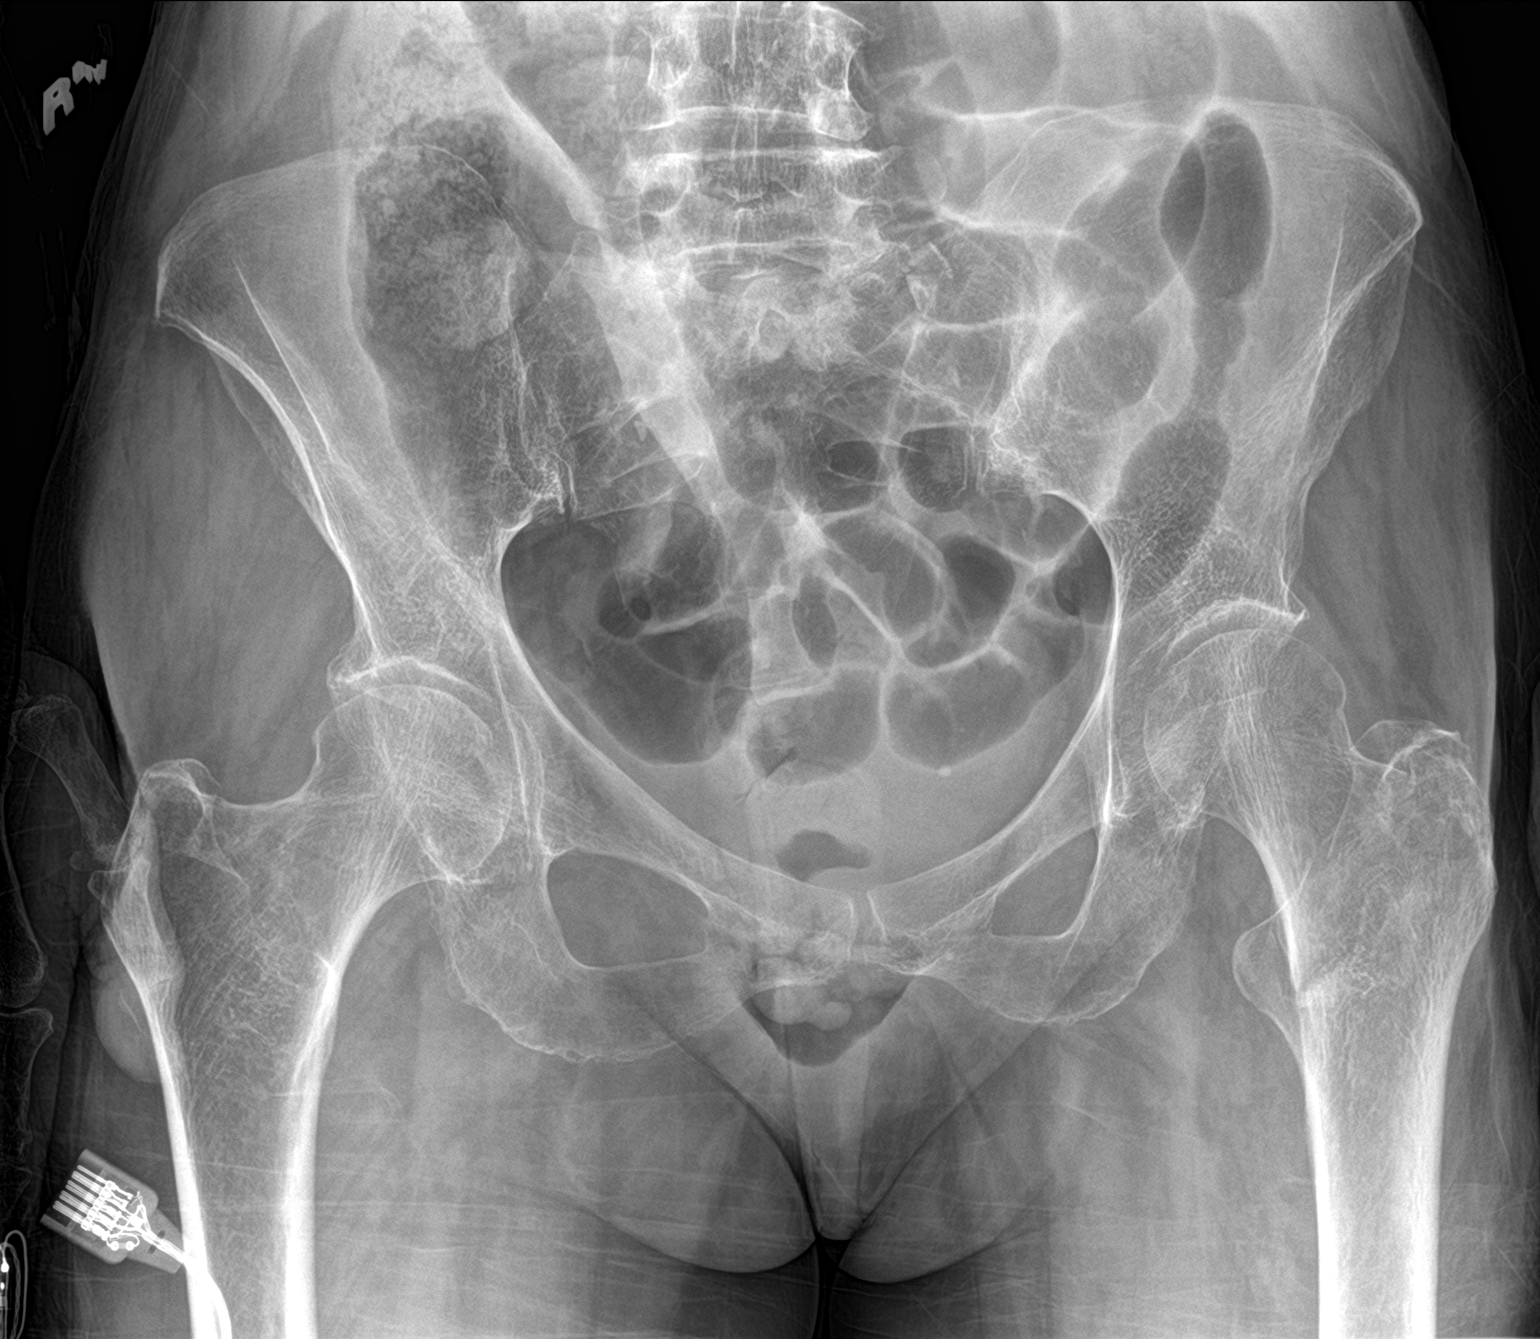

[hip ap]
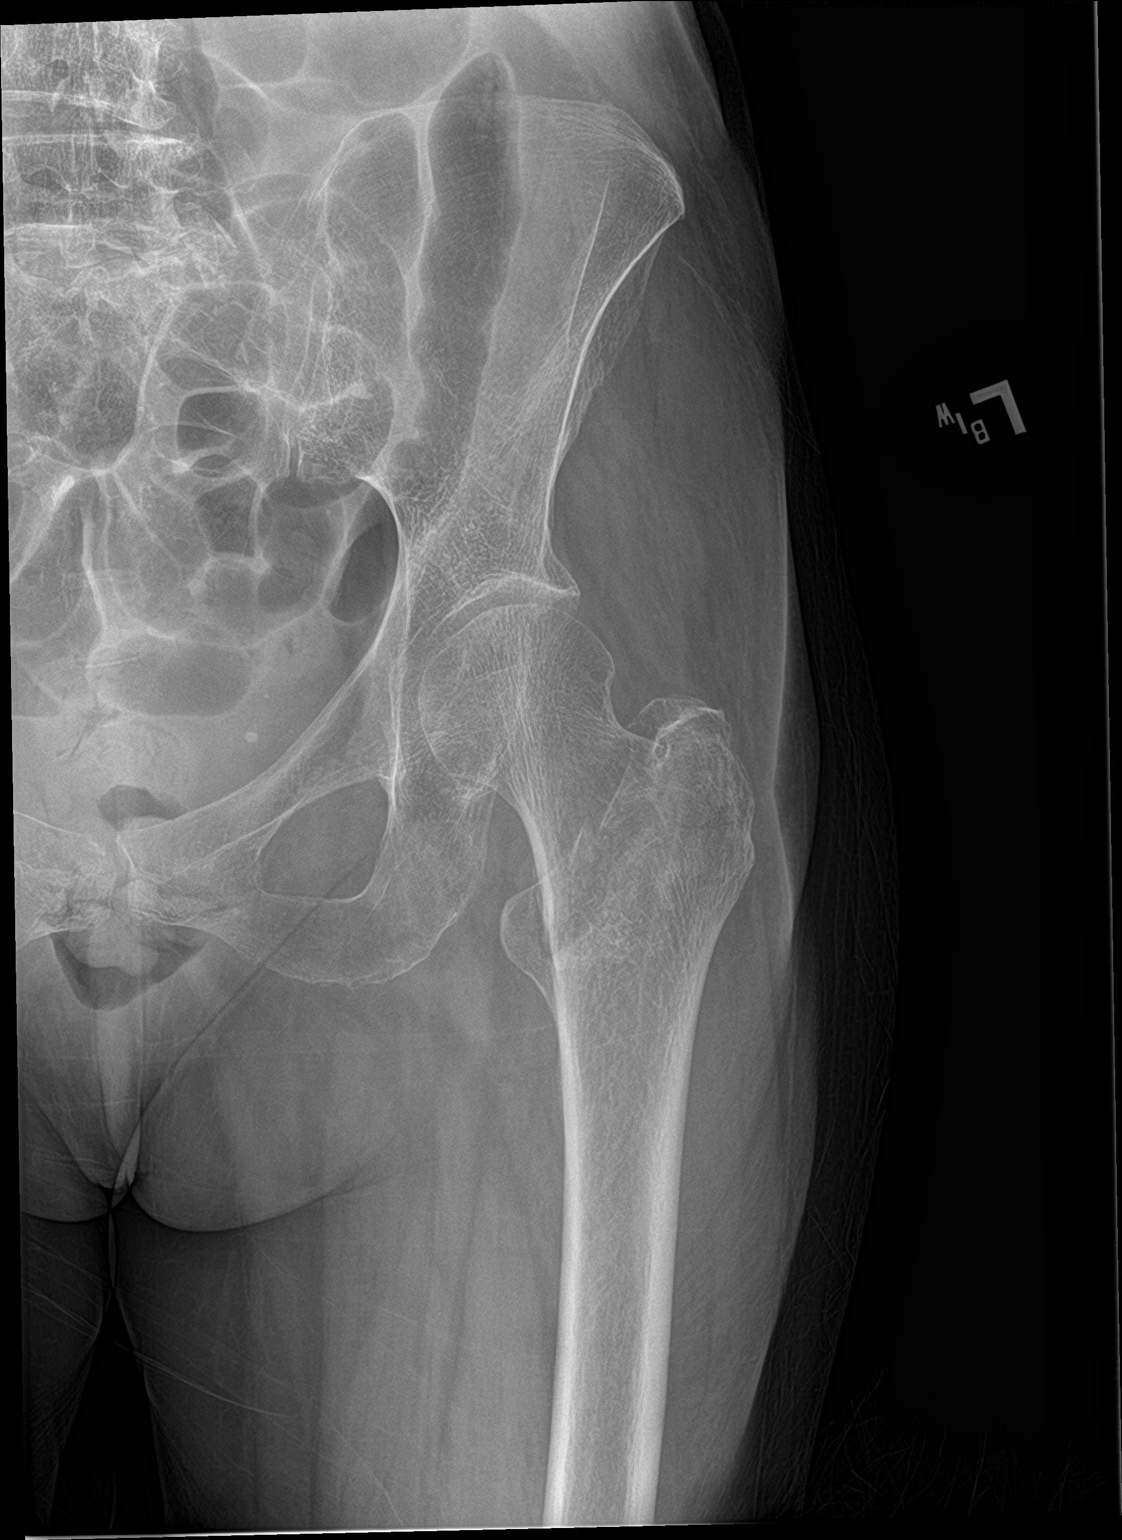

[hip lat]
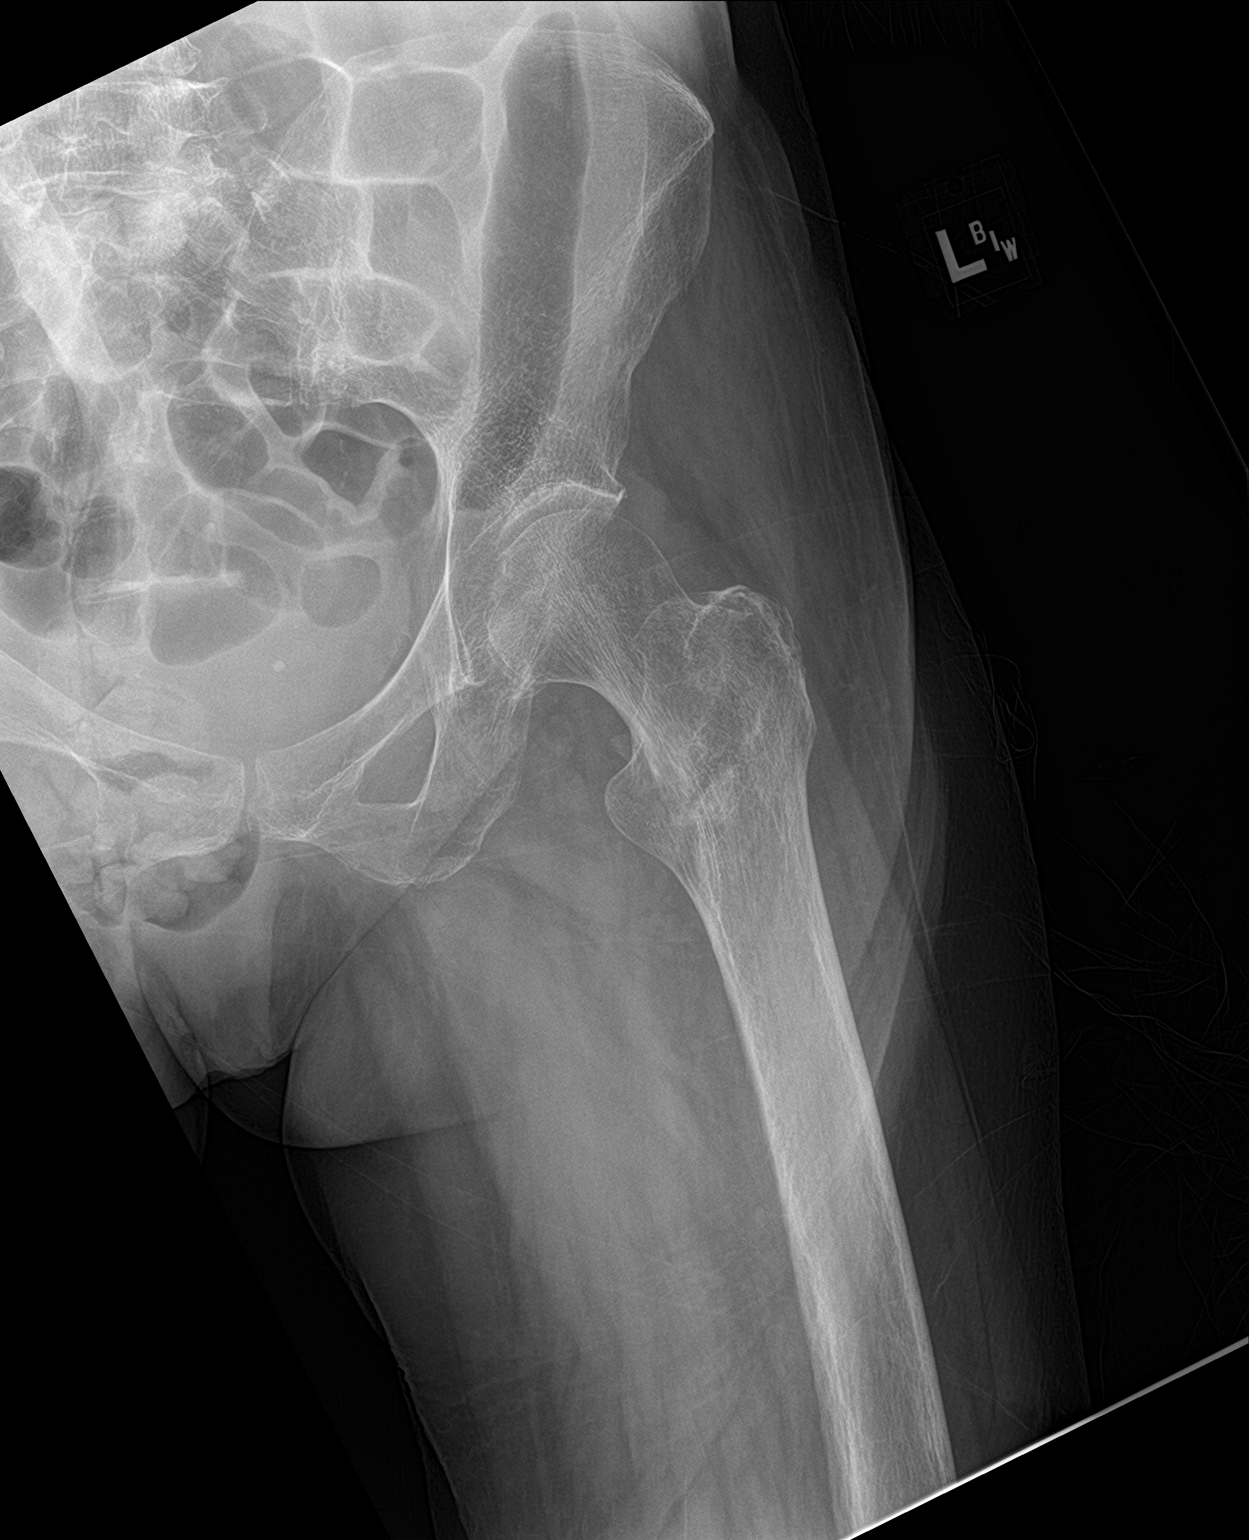

[3 of 3 positions shown; findings below may reference images not displayed]

FINDINGS: There appears to be cortical disruption involving the greater
trochanter concerning for mildly displaced fracture. The left
femoral head and neck appear intact. No dislocation is noted.
IMPRESSION: Probable mildly displaced fracture involving greater trochanter of
proximal left femur. CT scan of the left hip is recommended for
further evaluation.

## 2017-01-11 IMAGING — CT CT HIP*L* W/O CM
2 of 3 series · 17 of 46 positions shown, 19 images · non-contrast
Comparison: Radiographs dated 05/10/2016

CLINICAL DATA: Proximal left femur fracture secondary to a fall
yesterday.

EXAM:
CT OF THE LEFT HIP WITHOUT CONTRAST
TECHNIQUE: Multidetector CT imaging of the left hip was performed according to
the standard protocol. Multiplanar CT image reconstructions were
also generated.

[Series 4: hip 2.0 st · axial · 0.46mm/px · z∈[+782,+922]mm · 14 of 82 slices shown, 16 images]
[im 6/82  soft-tissue]
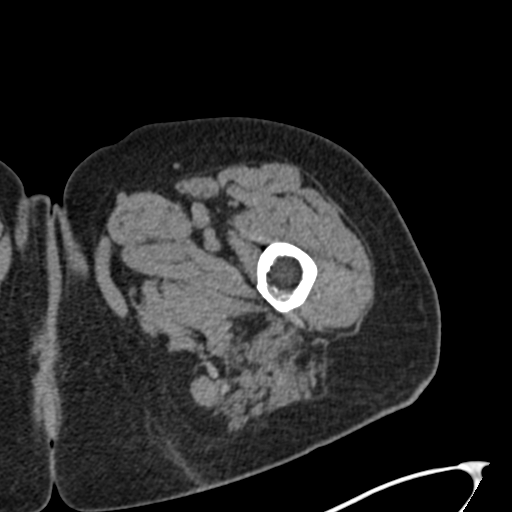
[im 6/82  bone]
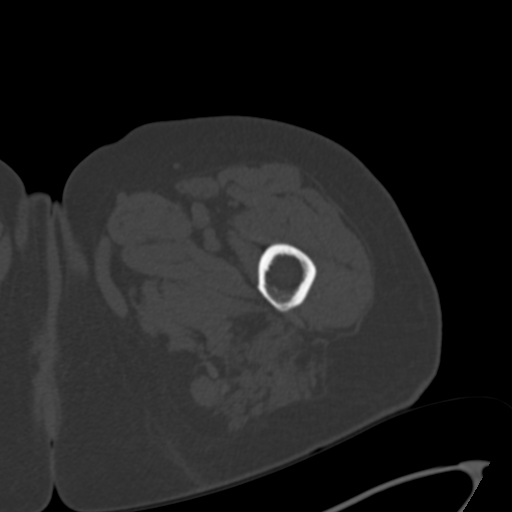
[im 11/82  soft-tissue]
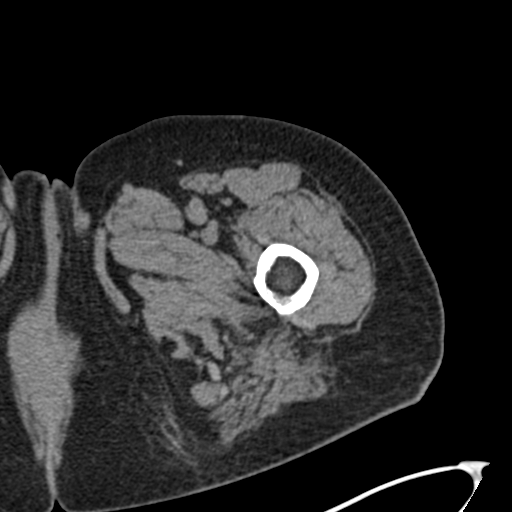
[im 16/82  soft-tissue]
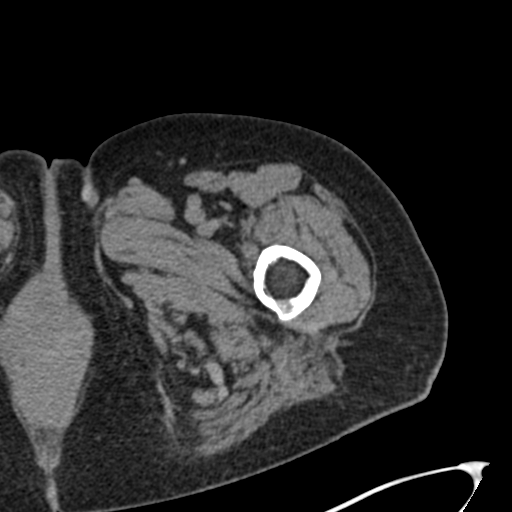
[im 21/82  soft-tissue]
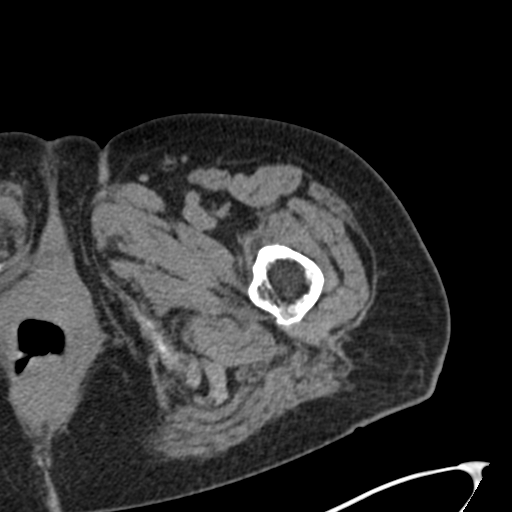
[im 27/82  soft-tissue]
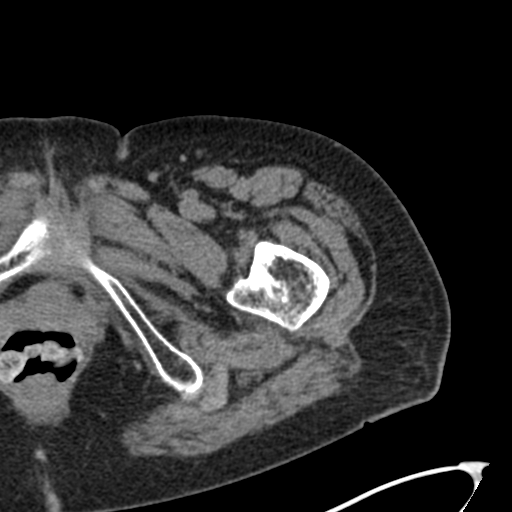
[im 32/82  soft-tissue]
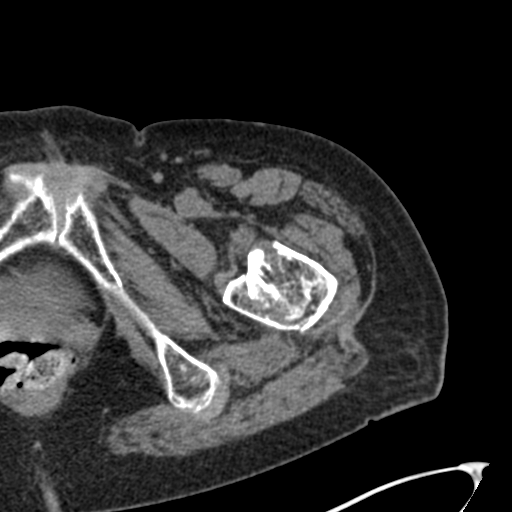
[im 37/82  soft-tissue]
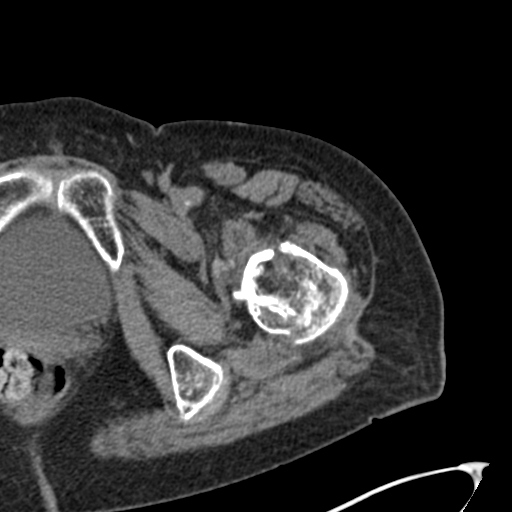
[im 45/82  soft-tissue]
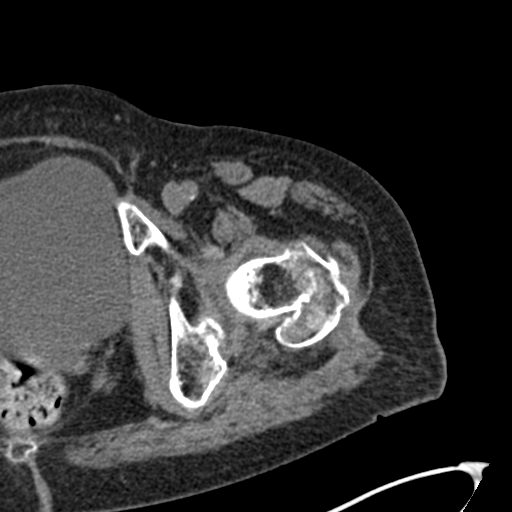
[im 50/82  soft-tissue]
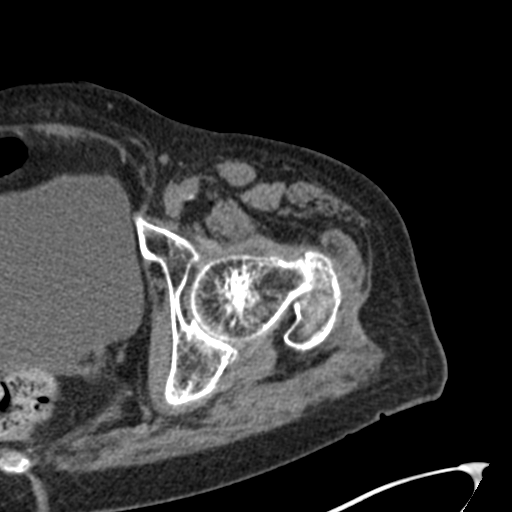
[im 50/82  bone]
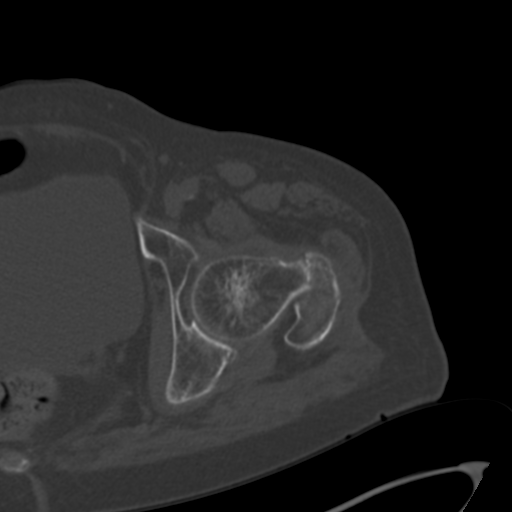
[im 55/82  soft-tissue]
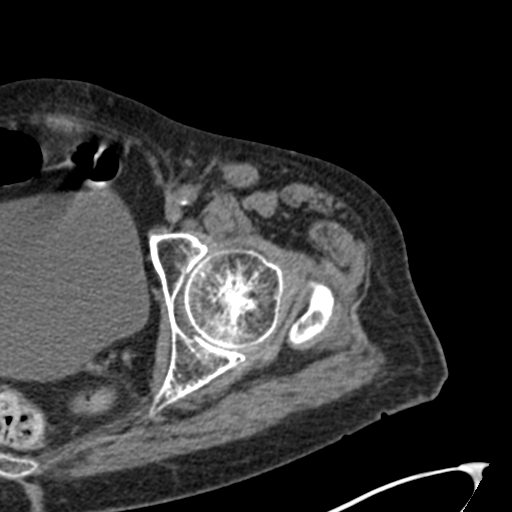
[im 61/82  soft-tissue]
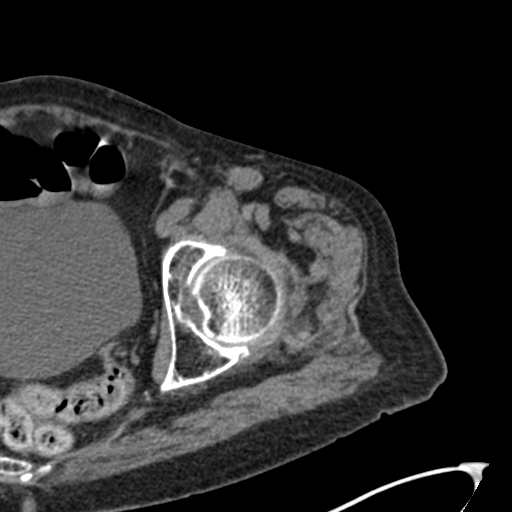
[im 66/82  soft-tissue]
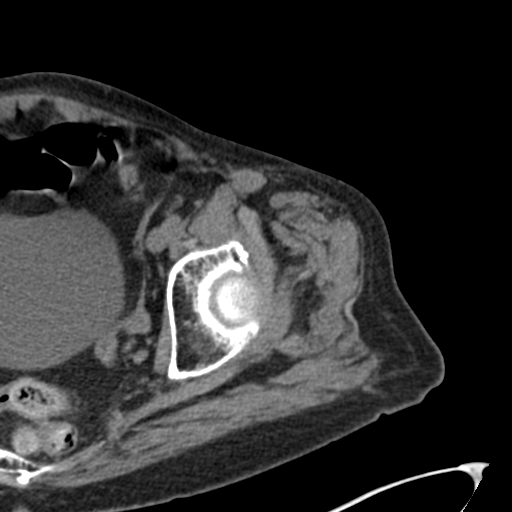
[im 71/82  soft-tissue]
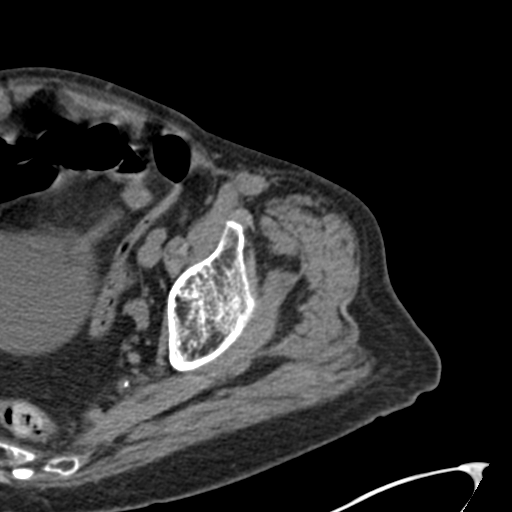
[im 76/82  soft-tissue]
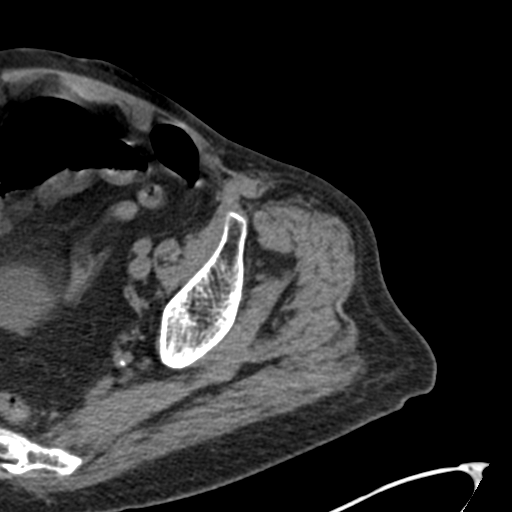

[Series 5: hip 2.0 cor. bone · coronal · 0.35mm/px · 3 of 94 slices shown]
[im 32/94  soft-tissue]
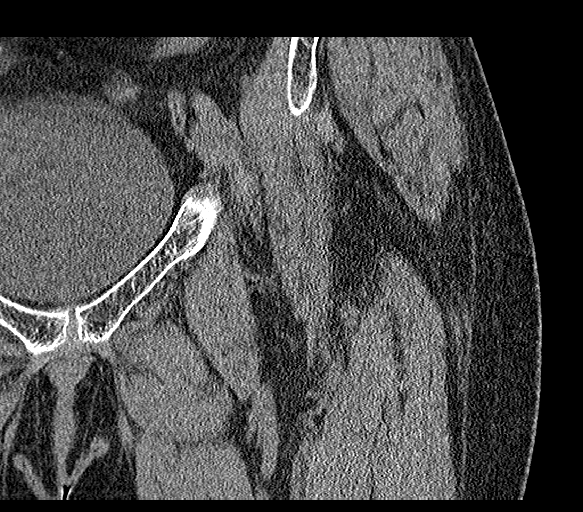
[im 42/94  soft-tissue]
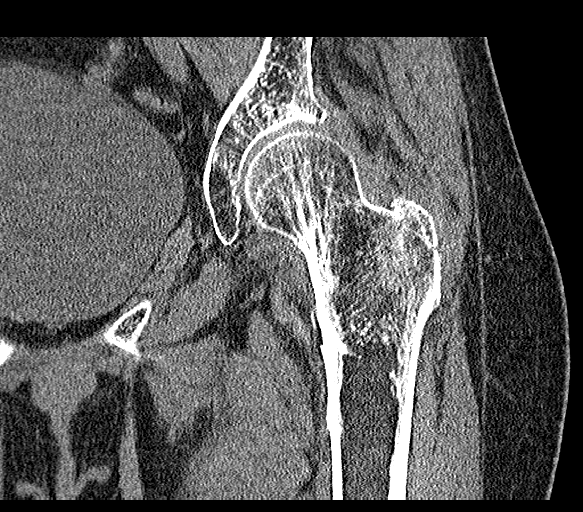
[im 52/94  soft-tissue]
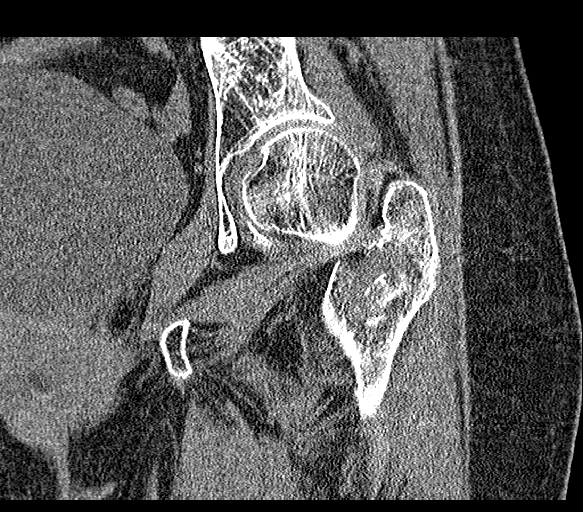

[17 of 46 positions shown; findings below may reference images not displayed]

FINDINGS: Bones/Joint/Cartilage

There is an impacted slightly comminuted intertrochanteric and low
left femoral neck fracture with minimal angulation and minimal
displacement.

The visualized pelvic bones are intact. Joint spaces well-preserved.
Adjacent soft tissues are normal.
IMPRESSION: Impacted slightly comminuted intertrochanteric and low femoral neck
fracture on the left.

## 2019-04-01 ENCOUNTER — Encounter: Payer: Self-pay | Admitting: Gastroenterology
